# Patient Record
Sex: Female | Born: 1955
Health system: Southern US, Community
[De-identification: ages and names within clinical notes are randomized; demographics above are authoritative.]

## PROBLEM LIST (undated history)

## (undated) DIAGNOSIS — H04129 Dry eye syndrome of unspecified lacrimal gland: Secondary | ICD-10-CM

## (undated) DIAGNOSIS — C801 Malignant (primary) neoplasm, unspecified: Secondary | ICD-10-CM

## (undated) DIAGNOSIS — M810 Age-related osteoporosis without current pathological fracture: Secondary | ICD-10-CM

## (undated) DIAGNOSIS — M797 Fibromyalgia: Secondary | ICD-10-CM

## (undated) DIAGNOSIS — F419 Anxiety disorder, unspecified: Secondary | ICD-10-CM

## (undated) DIAGNOSIS — R51 Headache: Secondary | ICD-10-CM

## (undated) DIAGNOSIS — J309 Allergic rhinitis, unspecified: Secondary | ICD-10-CM

## (undated) DIAGNOSIS — E559 Vitamin D deficiency, unspecified: Secondary | ICD-10-CM

## (undated) DIAGNOSIS — R519 Headache, unspecified: Secondary | ICD-10-CM

## (undated) DIAGNOSIS — Z973 Presence of spectacles and contact lenses: Secondary | ICD-10-CM

## (undated) DIAGNOSIS — G4730 Sleep apnea, unspecified: Secondary | ICD-10-CM

## (undated) DIAGNOSIS — F32A Depression, unspecified: Secondary | ICD-10-CM

## (undated) DIAGNOSIS — M1990 Unspecified osteoarthritis, unspecified site: Secondary | ICD-10-CM

## (undated) DIAGNOSIS — G8929 Other chronic pain: Secondary | ICD-10-CM

## (undated) DIAGNOSIS — F329 Major depressive disorder, single episode, unspecified: Secondary | ICD-10-CM

## (undated) DIAGNOSIS — I639 Cerebral infarction, unspecified: Secondary | ICD-10-CM

## (undated) DIAGNOSIS — K219 Gastro-esophageal reflux disease without esophagitis: Secondary | ICD-10-CM

## (undated) HISTORY — DX: Fibromyalgia: M79.7

## (undated) HISTORY — PX: BACK SURGERY: SHX140

## (undated) HISTORY — PX: BUNIONECTOMY: SHX129

## (undated) HISTORY — DX: Age-related osteoporosis without current pathological fracture: M81.0

## (undated) HISTORY — DX: Sleep apnea, unspecified: G47.30

## (undated) HISTORY — DX: Allergic rhinitis, unspecified: J30.9

## (undated) HISTORY — DX: Dry eye syndrome of unspecified lacrimal gland: H04.129

## (undated) HISTORY — DX: Other chronic pain: G89.29

## (undated) HISTORY — PX: ABDOMINAL HYSTERECTOMY: SHX81

## (undated) HISTORY — PX: SKIN CANCER EXCISION: SHX779

## (undated) HISTORY — PX: EYE SURGERY: SHX253

## (undated) HISTORY — PX: NASAL SINUS SURGERY: SHX719

## (undated) HISTORY — PX: TUBAL LIGATION: SHX77

---

## 2012-09-13 DIAGNOSIS — J3089 Other allergic rhinitis: Secondary | ICD-10-CM | POA: Insufficient documentation

## 2012-09-13 DIAGNOSIS — G629 Polyneuropathy, unspecified: Secondary | ICD-10-CM | POA: Insufficient documentation

## 2012-09-13 DIAGNOSIS — G43909 Migraine, unspecified, not intractable, without status migrainosus: Secondary | ICD-10-CM | POA: Insufficient documentation

## 2012-09-13 DIAGNOSIS — J309 Allergic rhinitis, unspecified: Secondary | ICD-10-CM | POA: Insufficient documentation

## 2015-12-05 DIAGNOSIS — M48061 Spinal stenosis, lumbar region without neurogenic claudication: Secondary | ICD-10-CM | POA: Insufficient documentation

## 2016-03-23 DIAGNOSIS — M7751 Other enthesopathy of right foot: Secondary | ICD-10-CM | POA: Insufficient documentation

## 2016-07-22 DIAGNOSIS — E663 Overweight: Secondary | ICD-10-CM | POA: Insufficient documentation

## 2016-07-22 HISTORY — DX: Overweight: E66.3

## 2016-07-28 ENCOUNTER — Other Ambulatory Visit: Payer: Self-pay | Admitting: Neurological Surgery

## 2016-08-13 NOTE — Pre-Procedure Instructions (Addendum)
    Sharon Logan  08/13/2016      [id number] - [place], [place] [address] [place] [address] Phone: [phone] Fax: [phone]    Your procedure is scheduled on Fri. Mar. 9  Report to [facility] Admitting at 8:00 A.M.  Call this number if you have problems the morning of surgery:  [phone]   Remember:  Do not eat food or drink liquids after midnight.   Take these medicines the morning of surgery with A SIP OF WATER : eye drops if needed, replax if needed, lexapro, estrace,flonase nasal spray if needed, hydrocodone if needed, protonix             1 week prior to surgery stop: aspirin, advil, motrin, ibuprofen, aleve, BC Powders, Goody's, vitamins/herbal medicines.   Do not wear jewelry, make-up or nail polish.  Do not wear lotions, powders, or perfumes, or deoderant.  Do not shave 48 hours prior to surgery.  Men may shave face and neck.  Do not bring valuables to the hospital.  [facility] is not responsible for any belongings or valuables.  Contacts, dentures or bridgework may not be worn into surgery.  Leave your suitcase in the car.  After surgery it may be brought to your room.  For patients admitted to the hospital, discharge time will be determined by your treatment team.  Patients discharged the day of surgery will not be allowed to drive home.    Special instructions:  Review preparing for surgery  Please read over the following fact sheets that you were given. Coughing and Deep Breathing and MRSA Information

## 2016-08-14 ENCOUNTER — Encounter (HOSPITAL_COMMUNITY)
Admission: RE | Admit: 2016-08-14 | Discharge: 2016-08-14 | Disposition: A | Discharge status: Home / Self Care | Payer: Federal, State, Local not specified - PPO | Source: Ambulatory Visit | Attending: Neurological Surgery | Admitting: Neurological Surgery

## 2016-08-14 ENCOUNTER — Encounter (HOSPITAL_COMMUNITY): Payer: Self-pay | Admitting: *Deleted

## 2016-08-14 DIAGNOSIS — M4316 Spondylolisthesis, lumbar region: Secondary | ICD-10-CM | POA: Diagnosis not present

## 2016-08-14 DIAGNOSIS — Z01818 Encounter for other preprocedural examination: Secondary | ICD-10-CM | POA: Insufficient documentation

## 2016-08-14 HISTORY — DX: Major depressive disorder, single episode, unspecified: F32.9

## 2016-08-14 HISTORY — DX: Headache, unspecified: R51.9

## 2016-08-14 HISTORY — DX: Depression, unspecified: F32.A

## 2016-08-14 HISTORY — DX: Anxiety disorder, unspecified: F41.9

## 2016-08-14 HISTORY — DX: Headache: R51

## 2016-08-14 LAB — TYPE AND SCREEN
ABO/RH(D): A POS
ANTIBODY SCREEN: NEGATIVE

## 2016-08-14 LAB — CBC
HCT: 40.6 % (ref 36.0–46.0)
Hemoglobin: 13.1 g/dL (ref 12.0–15.0)
MCH: 30 pg (ref 26.0–34.0)
MCHC: 32.3 g/dL (ref 30.0–36.0)
MCV: 92.9 fL (ref 78.0–100.0)
PLATELETS: 209 10*3/uL (ref 150–400)
RBC: 4.37 MIL/uL (ref 3.87–5.11)
RDW: 13.2 % (ref 11.5–15.5)
WBC: 4.9 10*3/uL (ref 4.0–10.5)

## 2016-08-14 LAB — SURGICAL PCR SCREEN
MRSA, PCR: NEGATIVE
Staphylococcus aureus: NEGATIVE

## 2016-08-14 LAB — ABO/RH: ABO/RH(D): A POS

## 2016-08-14 NOTE — Progress Notes (Signed)
PCP: Dr. Betti Cruz @ [facility] in [place]

## 2016-08-21 ENCOUNTER — Inpatient Hospital Stay (HOSPITAL_COMMUNITY): Payer: Federal, State, Local not specified - PPO | Admitting: Certified Registered"

## 2016-08-21 ENCOUNTER — Inpatient Hospital Stay (HOSPITAL_COMMUNITY): Payer: Federal, State, Local not specified - PPO

## 2016-08-21 ENCOUNTER — Inpatient Hospital Stay (HOSPITAL_COMMUNITY)
Admission: RE | Admit: 2016-08-21 | Discharge: 2016-08-22 | DRG: 455 | Disposition: A | Discharge status: Home / Self Care | Payer: Federal, State, Local not specified - PPO | Source: Ambulatory Visit | Attending: Neurological Surgery | Admitting: Neurological Surgery

## 2016-08-21 ENCOUNTER — Encounter (HOSPITAL_COMMUNITY)
Admission: RE | Disposition: A | Discharge status: Home / Self Care | Payer: Self-pay | Source: Ambulatory Visit | Attending: Neurological Surgery

## 2016-08-21 DIAGNOSIS — Z85828 Personal history of other malignant neoplasm of skin: Secondary | ICD-10-CM | POA: Diagnosis not present

## 2016-08-21 DIAGNOSIS — F329 Major depressive disorder, single episode, unspecified: Secondary | ICD-10-CM | POA: Diagnosis present

## 2016-08-21 DIAGNOSIS — M4316 Spondylolisthesis, lumbar region: Secondary | ICD-10-CM | POA: Diagnosis present

## 2016-08-21 DIAGNOSIS — M5416 Radiculopathy, lumbar region: Secondary | ICD-10-CM | POA: Diagnosis present

## 2016-08-21 DIAGNOSIS — M545 Low back pain: Secondary | ICD-10-CM | POA: Diagnosis present

## 2016-08-21 DIAGNOSIS — Z79899 Other long term (current) drug therapy: Secondary | ICD-10-CM | POA: Diagnosis not present

## 2016-08-21 DIAGNOSIS — Z419 Encounter for procedure for purposes other than remedying health state, unspecified: Secondary | ICD-10-CM

## 2016-08-21 DIAGNOSIS — M48061 Spinal stenosis, lumbar region without neurogenic claudication: Secondary | ICD-10-CM | POA: Diagnosis present

## 2016-08-21 DIAGNOSIS — Z7982 Long term (current) use of aspirin: Secondary | ICD-10-CM

## 2016-08-21 DIAGNOSIS — F419 Anxiety disorder, unspecified: Secondary | ICD-10-CM | POA: Diagnosis present

## 2016-08-21 IMAGING — CR DG LUMBAR SPINE 2-3V
2 series · 2 of 2 positions shown · non-contrast
Comparison: None.

CLINICAL DATA: L4-5 PLIF

EXAM:
LUMBAR SPINE - 2-3 VIEW

[lateral (1 of 2)]
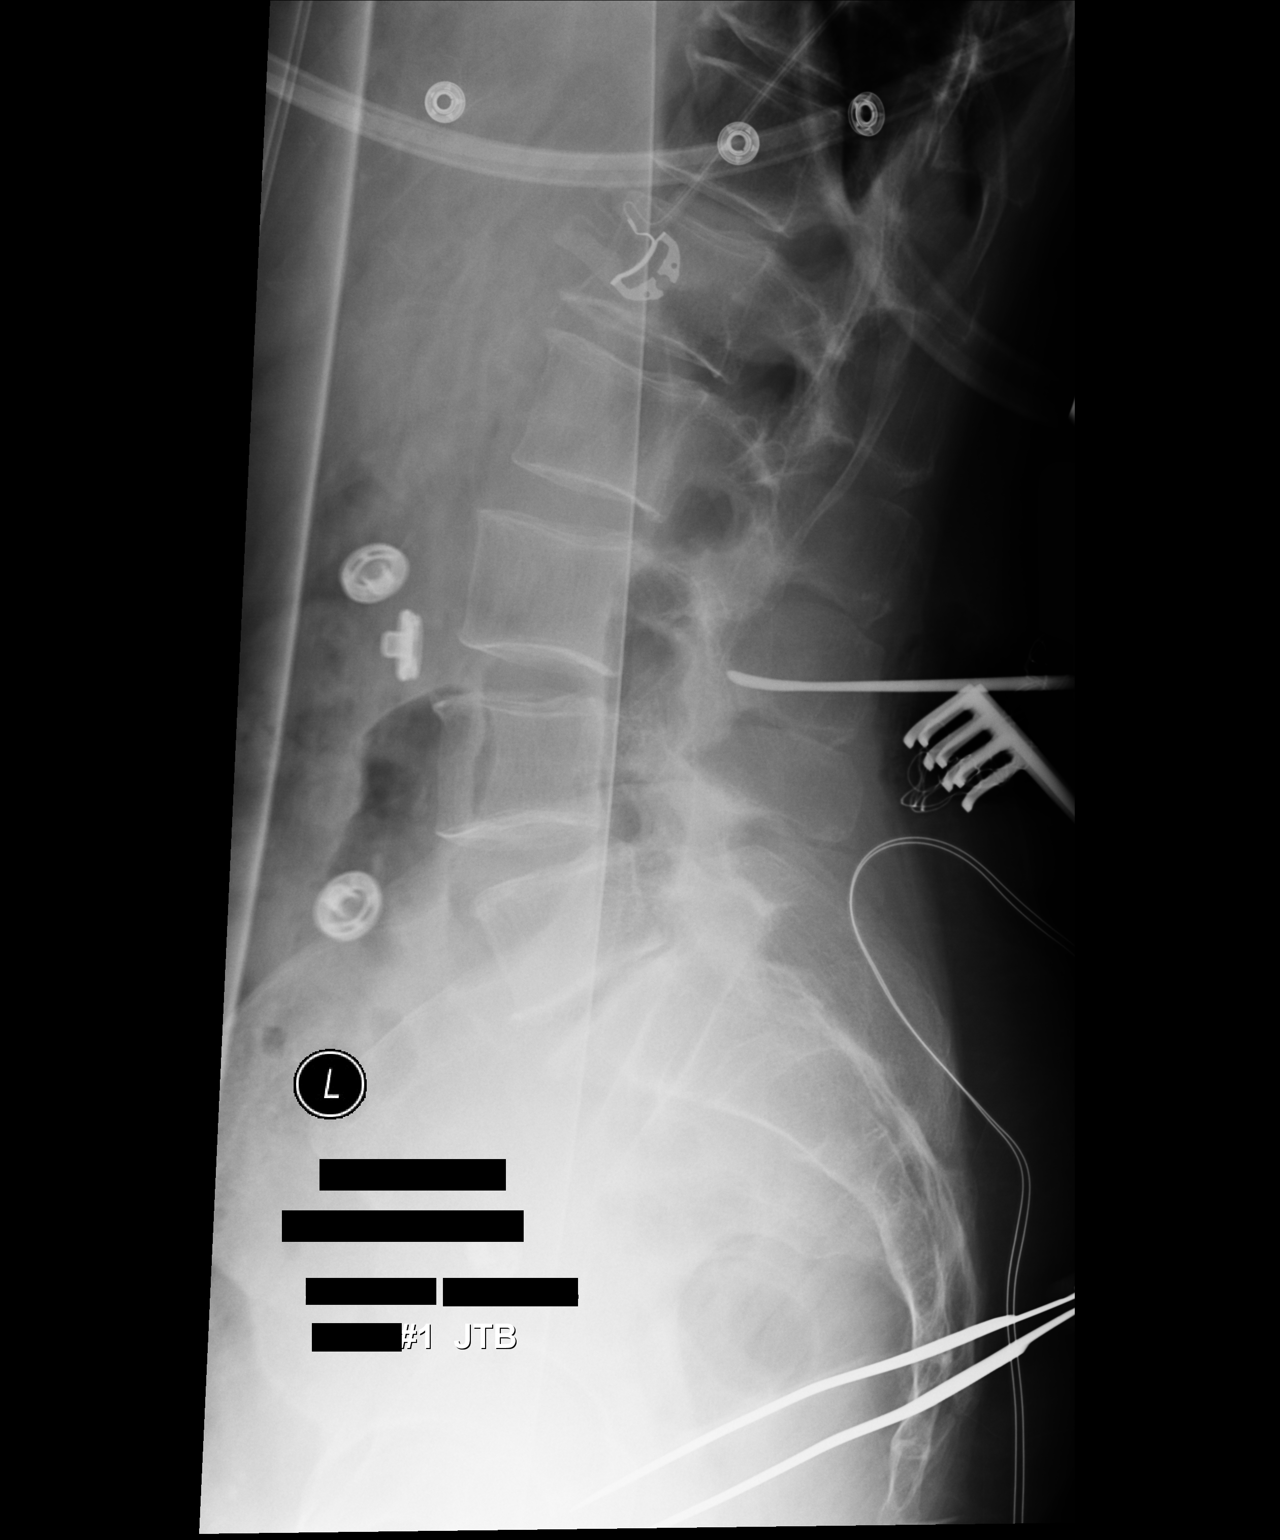

[lateral (2 of 2)]
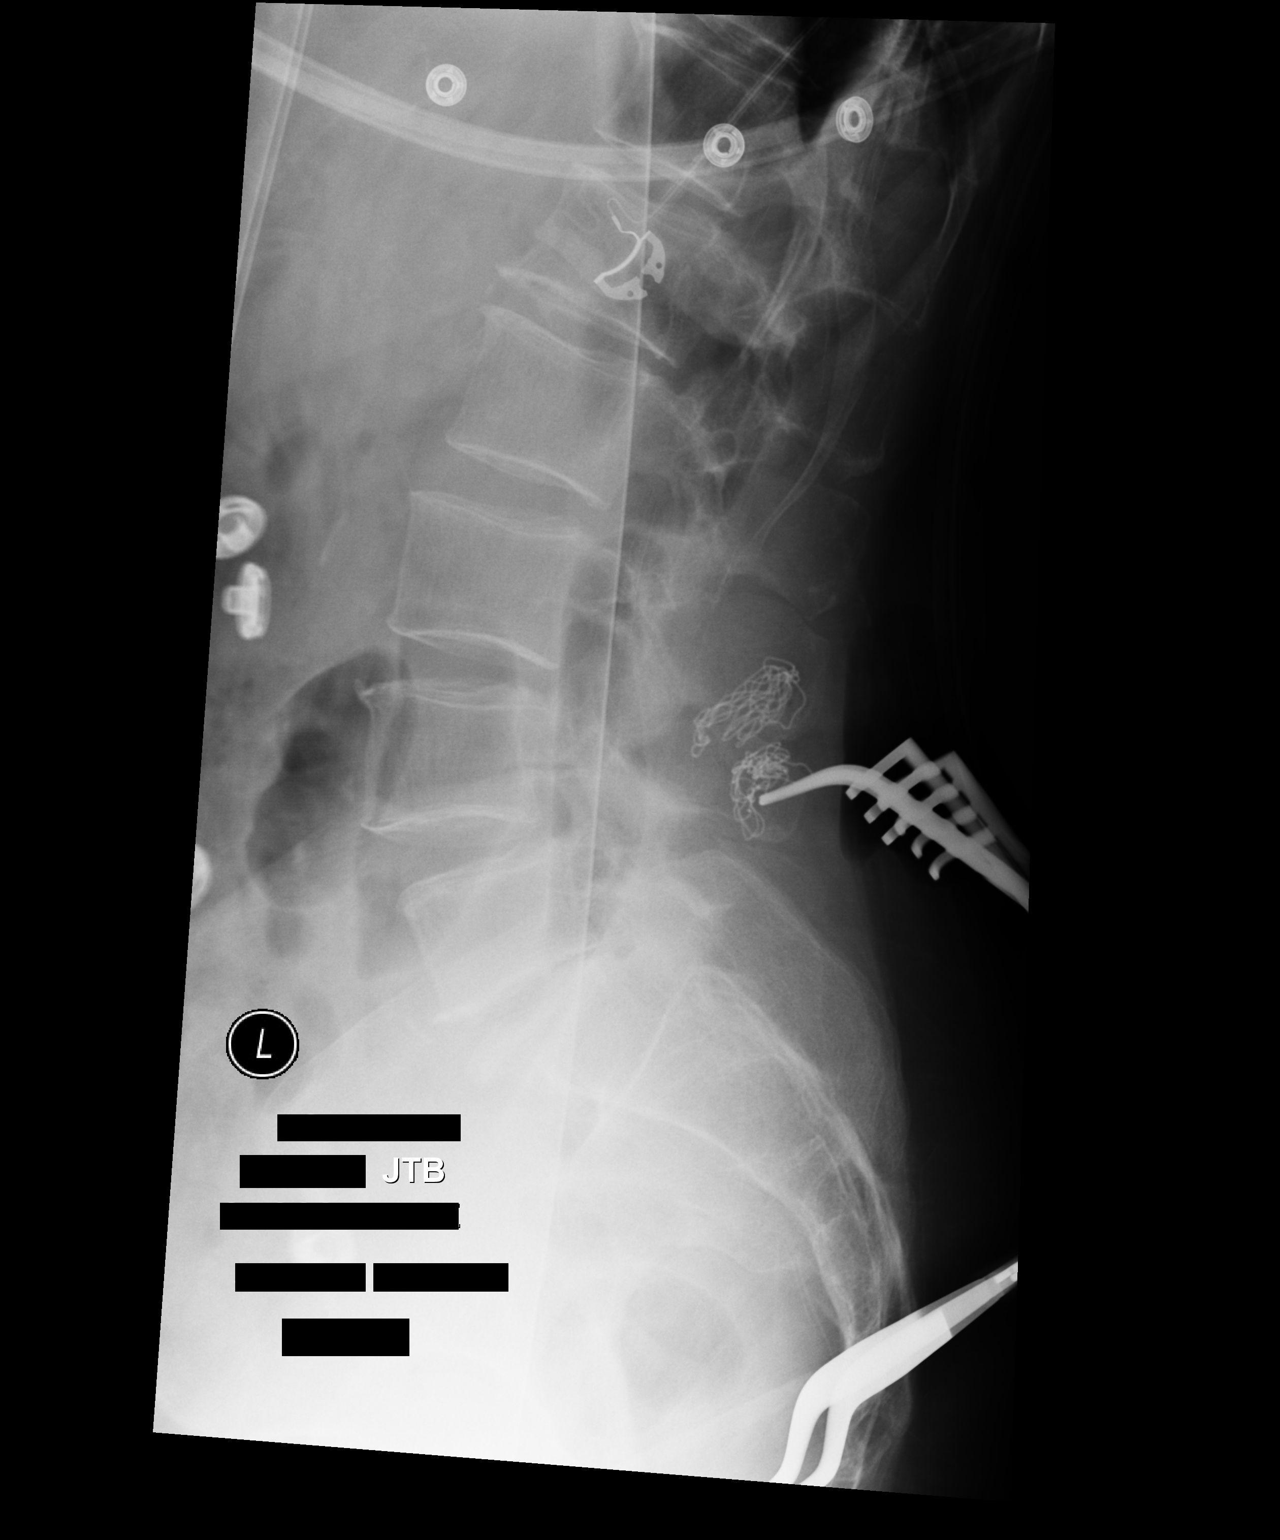

[2 of 2 positions shown; findings below may reference images not displayed]

FINDINGS: No previous imaging studies are directly available for comparison
for numbering purposes, the caudal most lumbar type vertebral body
is been labeled L5. This places the last full open disc space at
L5-S1.

Film of labeled 1 obtained at [7M] hours. Soft tissue retractors are
noted posteriorly with an apparent spines in the operative bed.
Surgical probe overlies the lower lumbar spine, superimposed on the
L3 spinous process with the tip of the probe just posterior to the
L3 inferior facet.

Film labeled 2 obtained [7M] hours. Soft tissue retractors and
surgical sponges again noted. Surgical device is overlying the lower
back with the tip superimposed on the L4 spinous process.
IMPRESSION: Intraoperative localization.

## 2016-08-21 IMAGING — RF DG C-ARM 61-120 MIN
1 series · 2 of 2 positions shown · non-contrast
Comparison: None.

CLINICAL DATA: L4-5 surgery

FLUOROSCOPY TIME:  20 seconds.
Images: 2
EXAM:
DG C-ARM 61-120 MIN

[Series 1: run · 2 of 2 slices shown]
[im 1/2]
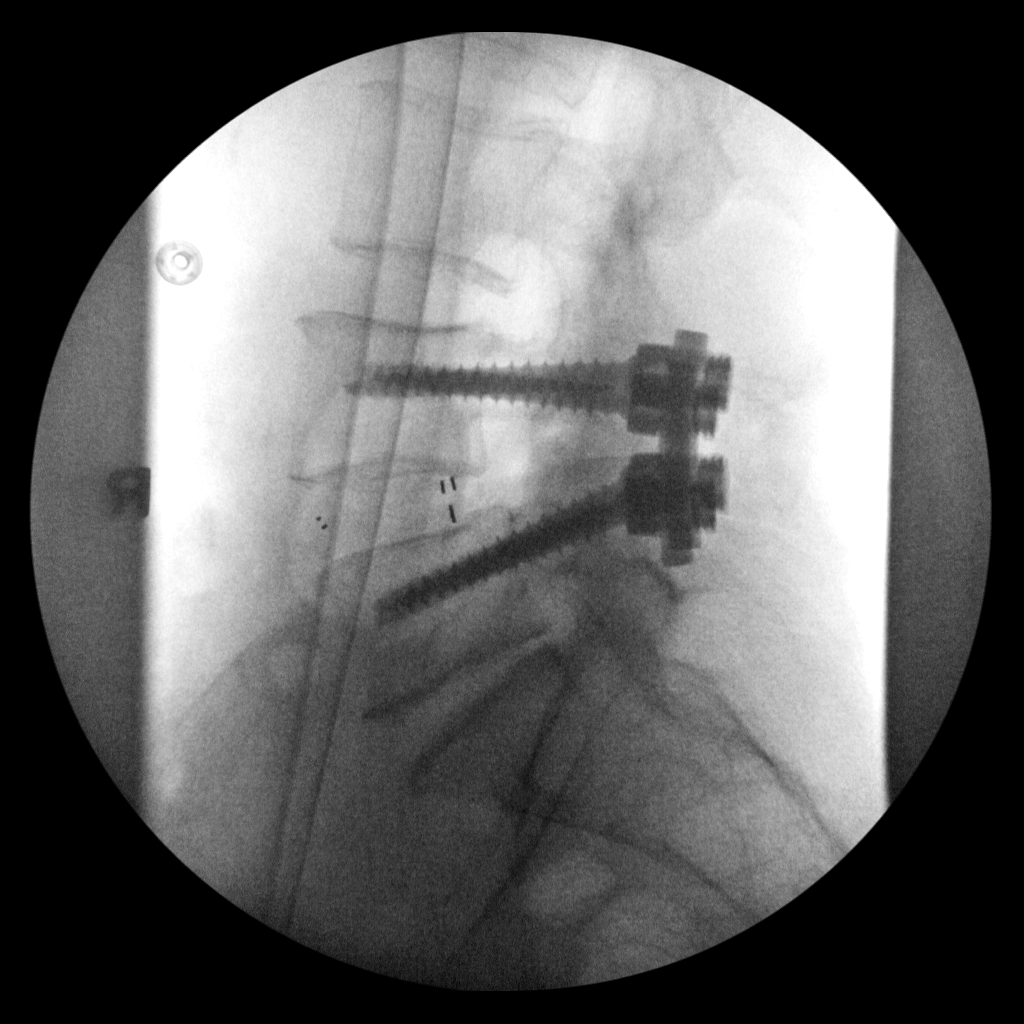
[im 2/2]
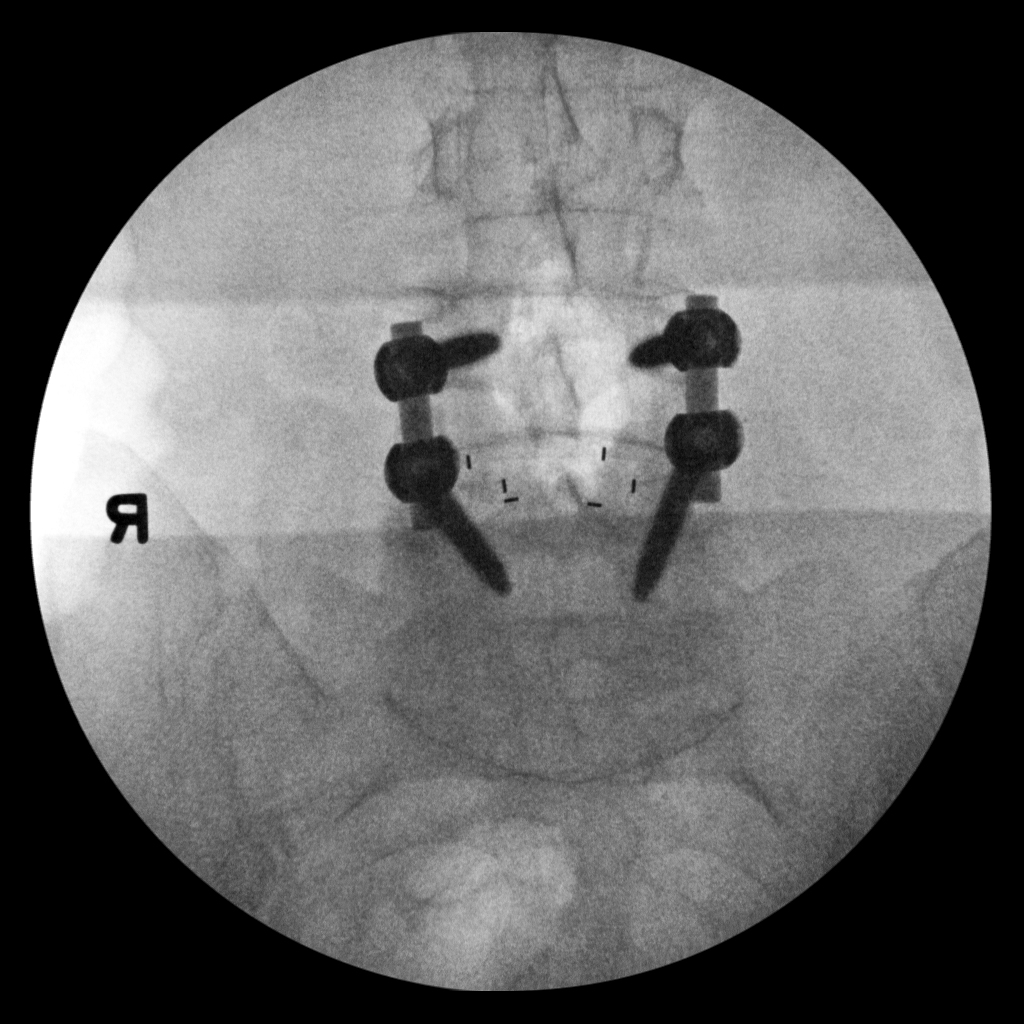

[2 of 2 positions shown; findings below may reference images not displayed]

FINDINGS: Pedicle rods and screws are seen at L4-5 with a disc spacer device
at the same level. Hardware is in good position.
IMPRESSION: L4-5 surgery as above.

## 2016-08-21 SURGERY — POSTERIOR LUMBAR FUSION 1 LEVEL
Anesthesia: General | Site: Back

## 2016-08-21 MED ORDER — DEXAMETHASONE SODIUM PHOSPHATE 10 MG/ML IJ SOLN
INTRAMUSCULAR | Status: AC
  Filled 2016-08-21: qty 1

## 2016-08-21 MED ORDER — PROPOFOL 10 MG/ML IV BOLUS
INTRAVENOUS | Status: AC
  Filled 2016-08-21: qty 20

## 2016-08-21 MED ORDER — FENTANYL CITRATE (PF) 100 MCG/2ML IJ SOLN
INTRAMUSCULAR | Status: AC
  Filled 2016-08-21: qty 2

## 2016-08-21 MED ORDER — BISACODYL 10 MG RE SUPP: 10.0000 mg | Freq: Every day | RECTAL | Status: DC | PRN

## 2016-08-21 MED ORDER — CEFAZOLIN SODIUM-DEXTROSE 2-4 GM/100ML-% IV SOLN
INTRAVENOUS | Status: AC
  Filled 2016-08-21: qty 100

## 2016-08-21 MED ORDER — CHLORHEXIDINE GLUCONATE CLOTH 2 % EX PADS: 6.0000 | MEDICATED_PAD | Freq: Once | CUTANEOUS | Status: DC

## 2016-08-21 MED ORDER — FENTANYL CITRATE (PF) 100 MCG/2ML IJ SOLN
INTRAMUSCULAR | Status: AC
  Filled 2016-08-21: qty 4

## 2016-08-21 MED ORDER — FLUTICASONE PROPIONATE 50 MCG/ACT NA SUSP: 2.0000 | Freq: Two times a day (BID) | NASAL | Status: DC | PRN

## 2016-08-21 MED ORDER — ONDANSETRON HCL 4 MG PO TABS: 4.0000 mg | ORAL_TABLET | Freq: Four times a day (QID) | ORAL | Status: DC | PRN

## 2016-08-21 MED ORDER — SACCHAROMYCES BOULARDII 250 MG PO CAPS
500.0000 mg | ORAL_CAPSULE | Freq: Every day | ORAL | Status: DC
  Administered 2016-08-22: 500 mg via ORAL
  Filled 2016-08-21 (×2): qty 2

## 2016-08-21 MED ORDER — THROMBIN 5000 UNITS EX SOLR
OROMUCOSAL | Status: DC | PRN
  Administered 2016-08-21: 5 mL

## 2016-08-21 MED ORDER — ELETRIPTAN HYDROBROMIDE 40 MG PO TABS
40.0000 mg | ORAL_TABLET | ORAL | Status: DC | PRN
  Filled 2016-08-21: qty 1

## 2016-08-21 MED ORDER — POLYETHYLENE GLYCOL 3350 17 G PO PACK: 17.0000 g | PACK | Freq: Every day | ORAL | Status: DC | PRN

## 2016-08-21 MED ORDER — SODIUM CHLORIDE 0.9 % IV SOLN: 250.0000 mL | INTRAVENOUS | Status: DC

## 2016-08-21 MED ORDER — ONDANSETRON HCL 4 MG/2ML IJ SOLN
INTRAMUSCULAR | Status: DC | PRN
  Administered 2016-08-21: 4 mg via INTRAVENOUS

## 2016-08-21 MED ORDER — DOCUSATE SODIUM 100 MG PO CAPS
100.0000 mg | ORAL_CAPSULE | Freq: Two times a day (BID) | ORAL | Status: DC
  Administered 2016-08-21 – 2016-08-22 (×2): 100 mg via ORAL
  Filled 2016-08-21 (×2): qty 1

## 2016-08-21 MED ORDER — EPHEDRINE SULFATE 50 MG/ML IJ SOLN
INTRAMUSCULAR | Status: DC | PRN
  Administered 2016-08-21 (×3): 10 mg via INTRAVENOUS

## 2016-08-21 MED ORDER — THROMBIN 20000 UNITS EX SOLR
CUTANEOUS | Status: AC
  Filled 2016-08-21: qty 20000

## 2016-08-21 MED ORDER — ACETAMINOPHEN 325 MG PO TABS: 650.0000 mg | ORAL_TABLET | ORAL | Status: DC | PRN

## 2016-08-21 MED ORDER — SODIUM CHLORIDE 0.9% FLUSH
3.0000 mL | Freq: Two times a day (BID) | INTRAVENOUS | Status: DC
  Administered 2016-08-21: 3 mL via INTRAVENOUS

## 2016-08-21 MED ORDER — ALUM & MAG HYDROXIDE-SIMETH 200-200-20 MG/5ML PO SUSP: 30.0000 mL | Freq: Four times a day (QID) | ORAL | Status: DC | PRN

## 2016-08-21 MED ORDER — LIDOCAINE-EPINEPHRINE 1 %-1:100000 IJ SOLN
INTRAMUSCULAR | Status: DC | PRN
  Administered 2016-08-21: 5 mL

## 2016-08-21 MED ORDER — ONDANSETRON HCL 4 MG/2ML IJ SOLN
INTRAMUSCULAR | Status: AC
  Filled 2016-08-21: qty 2

## 2016-08-21 MED ORDER — THROMBIN 20000 UNITS EX SOLR
CUTANEOUS | Status: DC | PRN
  Administered 2016-08-21: 20 mL via TOPICAL

## 2016-08-21 MED ORDER — ESTRADIOL 1 MG PO TABS
1.0000 mg | ORAL_TABLET | Freq: Every day | ORAL | Status: DC
  Administered 2016-08-22: 1 mg via ORAL
  Filled 2016-08-21 (×2): qty 1

## 2016-08-21 MED ORDER — HYDROCODONE-ACETAMINOPHEN 10-325 MG PO TABS
1.0000 | ORAL_TABLET | Freq: Two times a day (BID) | ORAL | Status: DC
  Administered 2016-08-21 – 2016-08-22 (×2): 1 via ORAL
  Filled 2016-08-21 (×2): qty 1

## 2016-08-21 MED ORDER — SUGAMMADEX SODIUM 200 MG/2ML IV SOLN
INTRAVENOUS | Status: AC
  Filled 2016-08-21: qty 2

## 2016-08-21 MED ORDER — CEFAZOLIN SODIUM-DEXTROSE 2-4 GM/100ML-% IV SOLN
2.0000 g | Freq: Three times a day (TID) | INTRAVENOUS | Status: AC
  Administered 2016-08-21: 2 g via INTRAVENOUS
  Filled 2016-08-21 (×3): qty 100

## 2016-08-21 MED ORDER — PANTOPRAZOLE SODIUM 40 MG PO TBEC
40.0000 mg | DELAYED_RELEASE_TABLET | Freq: Every day | ORAL | Status: DC
  Administered 2016-08-21 – 2016-08-22 (×2): 40 mg via ORAL
  Filled 2016-08-21 (×2): qty 1

## 2016-08-21 MED ORDER — SENNA 8.6 MG PO TABS
1.0000 | ORAL_TABLET | Freq: Two times a day (BID) | ORAL | Status: DC
  Administered 2016-08-21 – 2016-08-22 (×2): 8.6 mg via ORAL
  Filled 2016-08-21 (×2): qty 1

## 2016-08-21 MED ORDER — EPHEDRINE 5 MG/ML INJ
INTRAVENOUS | Status: AC
  Filled 2016-08-21: qty 10

## 2016-08-21 MED ORDER — BUPIVACAINE HCL (PF) 0.5 % IJ SOLN
INTRAMUSCULAR | Status: DC | PRN
  Administered 2016-08-21: 20 mL
  Administered 2016-08-21: 5 mL

## 2016-08-21 MED ORDER — HYDROCODONE-ACETAMINOPHEN 5-325 MG PO TABS
1.0000 | ORAL_TABLET | ORAL | Status: DC | PRN
  Administered 2016-08-21 – 2016-08-22 (×3): 2 via ORAL
  Filled 2016-08-21 (×3): qty 2

## 2016-08-21 MED ORDER — HYDROCODONE-ACETAMINOPHEN 10-325 MG PO TABS: 1.0000 | ORAL_TABLET | Freq: Four times a day (QID) | ORAL | 0 refills | Status: DC | PRN

## 2016-08-21 MED ORDER — LIDOCAINE-EPINEPHRINE 2 %-1:100000 IJ SOLN
INTRAMUSCULAR | Status: AC
  Filled 2016-08-21: qty 1

## 2016-08-21 MED ORDER — PROPOFOL 10 MG/ML IV BOLUS
INTRAVENOUS | Status: DC | PRN
  Administered 2016-08-21: 140 mg via INTRAVENOUS

## 2016-08-21 MED ORDER — CEFAZOLIN SODIUM 1 G IJ SOLR
INTRAMUSCULAR | Status: DC | PRN
  Administered 2016-08-21: 2 g via INTRAMUSCULAR

## 2016-08-21 MED ORDER — THROMBIN 5000 UNITS EX SOLR
CUTANEOUS | Status: AC
  Filled 2016-08-21: qty 5000

## 2016-08-21 MED ORDER — LIDOCAINE HCL (CARDIAC) 20 MG/ML IV SOLN
INTRAVENOUS | Status: DC | PRN
  Administered 2016-08-21: 80 mg via INTRAVENOUS

## 2016-08-21 MED ORDER — HYDROMORPHONE HCL 1 MG/ML IJ SOLN: 0.2500 mg | INTRAMUSCULAR | Status: DC | PRN

## 2016-08-21 MED ORDER — FLEET ENEMA 7-19 GM/118ML RE ENEM: 1.0000 | ENEMA | Freq: Once | RECTAL | Status: DC | PRN

## 2016-08-21 MED ORDER — FENTANYL CITRATE (PF) 100 MCG/2ML IJ SOLN
INTRAMUSCULAR | Status: DC | PRN
  Administered 2016-08-21: 100 ug via INTRAVENOUS
  Administered 2016-08-21 (×5): 50 ug via INTRAVENOUS

## 2016-08-21 MED ORDER — SODIUM CHLORIDE 0.9 % IV SOLN: INTRAVENOUS | Status: DC

## 2016-08-21 MED ORDER — MENTHOL 3 MG MT LOZG: 1.0000 | LOZENGE | OROMUCOSAL | Status: DC | PRN

## 2016-08-21 MED ORDER — LACTATED RINGERS IV SOLN
INTRAVENOUS | Status: DC
  Administered 2016-08-21: 13:00:00 via INTRAVENOUS
  Administered 2016-08-21: 50 mL/h via INTRAVENOUS
  Administered 2016-08-21: 15:00:00 via INTRAVENOUS

## 2016-08-21 MED ORDER — ONDANSETRON HCL 4 MG/2ML IJ SOLN: 4.0000 mg | Freq: Four times a day (QID) | INTRAMUSCULAR | Status: DC | PRN

## 2016-08-21 MED ORDER — ESCITALOPRAM OXALATE 10 MG PO TABS
10.0000 mg | ORAL_TABLET | Freq: Every day | ORAL | Status: DC
  Administered 2016-08-22: 10 mg via ORAL
  Filled 2016-08-21 (×2): qty 1

## 2016-08-21 MED ORDER — DEXAMETHASONE 1 MG PO TABS: ORAL_TABLET | ORAL | 0 refills | Status: DC

## 2016-08-21 MED ORDER — HYDROMORPHONE HCL 1 MG/ML IJ SOLN
1.0000 mg | INTRAMUSCULAR | Status: DC | PRN
  Administered 2016-08-21 – 2016-08-22 (×2): 1 mg via INTRAVENOUS
  Filled 2016-08-21 (×2): qty 1

## 2016-08-21 MED ORDER — BUPIVACAINE HCL (PF) 0.5 % IJ SOLN
INTRAMUSCULAR | Status: AC
  Filled 2016-08-21: qty 30

## 2016-08-21 MED ORDER — 0.9 % SODIUM CHLORIDE (POUR BTL) OPTIME
TOPICAL | Status: DC | PRN
  Administered 2016-08-21: 1000 mL

## 2016-08-21 MED ORDER — POLYVINYL ALCOHOL 1.4 % OP SOLN: 1.0000 [drp] | Freq: Every day | OPHTHALMIC | Status: DC | PRN

## 2016-08-21 MED ORDER — MIDAZOLAM HCL 2 MG/2ML IJ SOLN
INTRAMUSCULAR | Status: AC
  Filled 2016-08-21: qty 2

## 2016-08-21 MED ORDER — METHOCARBAMOL 500 MG PO TABS
500.0000 mg | ORAL_TABLET | Freq: Four times a day (QID) | ORAL | Status: DC | PRN
  Administered 2016-08-21: 500 mg via ORAL
  Filled 2016-08-21: qty 1

## 2016-08-21 MED ORDER — DEXAMETHASONE SODIUM PHOSPHATE 10 MG/ML IJ SOLN
INTRAMUSCULAR | Status: DC | PRN
  Administered 2016-08-21: 10 mg via INTRAVENOUS

## 2016-08-21 MED ORDER — ACETAMINOPHEN 650 MG RE SUPP: 650.0000 mg | RECTAL | Status: DC | PRN

## 2016-08-21 MED ORDER — PREGABALIN 50 MG PO CAPS
50.0000 mg | ORAL_CAPSULE | Freq: Every day | ORAL | Status: DC
  Administered 2016-08-21: 50 mg via ORAL
  Filled 2016-08-21: qty 1

## 2016-08-21 MED ORDER — METHOCARBAMOL 1000 MG/10ML IJ SOLN
500.0000 mg | Freq: Four times a day (QID) | INTRAVENOUS | Status: DC | PRN
  Filled 2016-08-21: qty 5

## 2016-08-21 MED ORDER — ROCURONIUM BROMIDE 100 MG/10ML IV SOLN
INTRAVENOUS | Status: DC | PRN
  Administered 2016-08-21: 50 mg via INTRAVENOUS

## 2016-08-21 MED ORDER — SODIUM CHLORIDE 0.9% FLUSH: 3.0000 mL | INTRAVENOUS | Status: DC | PRN

## 2016-08-21 MED ORDER — DIAZEPAM 5 MG PO TABS: 5.0000 mg | ORAL_TABLET | Freq: Four times a day (QID) | ORAL | 0 refills | Status: DC | PRN

## 2016-08-21 MED ORDER — SODIUM CHLORIDE 0.9 % IR SOLN
Status: DC | PRN
  Administered 2016-08-21: 500 mL

## 2016-08-21 MED ORDER — CEFAZOLIN SODIUM-DEXTROSE 2-4 GM/100ML-% IV SOLN: 2.0000 g | INTRAVENOUS | Status: DC

## 2016-08-21 MED ORDER — DEXAMETHASONE SODIUM PHOSPHATE 4 MG/ML IJ SOLN: 2.0000 mg | Freq: Two times a day (BID) | INTRAMUSCULAR | Status: DC

## 2016-08-21 MED ORDER — SUGAMMADEX SODIUM 200 MG/2ML IV SOLN
INTRAVENOUS | Status: DC | PRN
  Administered 2016-08-21: 140 mg via INTRAVENOUS

## 2016-08-21 MED ORDER — DEXAMETHASONE 2 MG PO TABS
2.0000 mg | ORAL_TABLET | Freq: Two times a day (BID) | ORAL | Status: DC
  Administered 2016-08-21 – 2016-08-22 (×2): 2 mg via ORAL
  Filled 2016-08-21 (×2): qty 1

## 2016-08-21 MED ORDER — PHENOL 1.4 % MT LIQD: 1.0000 | OROMUCOSAL | Status: DC | PRN

## 2016-08-21 MED ORDER — MIDAZOLAM HCL 5 MG/5ML IJ SOLN
INTRAMUSCULAR | Status: DC | PRN
  Administered 2016-08-21: 2 mg via INTRAVENOUS

## 2016-08-21 SURGICAL SUPPLY — 69 items
BAG DECANTER FOR FLEXI CONT (MISCELLANEOUS) ×2 IMPLANT
BASKET BONE COLLECTION (BASKET) ×2 IMPLANT
BLADE CLIPPER SURG (BLADE) IMPLANT
BONE EQUIVA 10CC (Bone Implant) ×2 IMPLANT
BUR MATCHSTICK NEURO 3.0 LAGG (BURR) ×2 IMPLANT
CANISTER SUCT 3000ML PPV (MISCELLANEOUS) ×2 IMPLANT
CARTRIDGE OIL MAESTRO DRILL (MISCELLANEOUS) ×1 IMPLANT
CONT SPEC 4OZ CLIKSEAL STRL BL (MISCELLANEOUS) ×2 IMPLANT
COVER BACK TABLE 60X90IN (DRAPES) ×2 IMPLANT
DECANTER SPIKE VIAL GLASS SM (MISCELLANEOUS) ×2 IMPLANT
DERMABOND ADVANCED (GAUZE/BANDAGES/DRESSINGS) ×1
DERMABOND ADVANCED .7 DNX12 (GAUZE/BANDAGES/DRESSINGS) ×1 IMPLANT
DEVICE DISSECT PLASMABLAD 3.0S (MISCELLANEOUS) ×1 IMPLANT
DIFFUSER DRILL AIR PNEUMATIC (MISCELLANEOUS) ×2 IMPLANT
DRAPE C-ARM 42X72 X-RAY (DRAPES) ×4 IMPLANT
DRAPE HALF SHEET 40X57 (DRAPES) IMPLANT
DRAPE LAPAROTOMY 100X72X124 (DRAPES) ×2 IMPLANT
DRAPE POUCH INSTRU U-SHP 10X18 (DRAPES) ×2 IMPLANT
DRSG OPSITE POSTOP 4X6 (GAUZE/BANDAGES/DRESSINGS) ×2 IMPLANT
DURAPREP 26ML APPLICATOR (WOUND CARE) ×2 IMPLANT
DURASEAL APPLICATOR TIP (TIP) IMPLANT
DURASEAL SPINE SEALANT 3ML (MISCELLANEOUS) IMPLANT
ELECT REM PT RETURN 9FT ADLT (ELECTROSURGICAL) ×2
ELECTRODE REM PT RTRN 9FT ADLT (ELECTROSURGICAL) ×1 IMPLANT
GAUZE SPONGE 4X4 12PLY STRL (GAUZE/BANDAGES/DRESSINGS) ×2 IMPLANT
GAUZE SPONGE 4X4 16PLY XRAY LF (GAUZE/BANDAGES/DRESSINGS) IMPLANT
GLOVE BIO SURGEON STRL SZ8 (GLOVE) ×2 IMPLANT
GLOVE BIOGEL PI IND STRL 6.5 (GLOVE) ×3 IMPLANT
GLOVE BIOGEL PI IND STRL 7.5 (GLOVE) ×1 IMPLANT
GLOVE BIOGEL PI IND STRL 8 (GLOVE) ×1 IMPLANT
GLOVE BIOGEL PI IND STRL 8.5 (GLOVE) ×2 IMPLANT
GLOVE BIOGEL PI INDICATOR 6.5 (GLOVE) ×3
GLOVE BIOGEL PI INDICATOR 7.5 (GLOVE) ×1
GLOVE BIOGEL PI INDICATOR 8 (GLOVE) ×1
GLOVE BIOGEL PI INDICATOR 8.5 (GLOVE) ×2
GLOVE ECLIPSE 7.5 STRL STRAW (GLOVE) ×4 IMPLANT
GLOVE ECLIPSE 8.5 STRL (GLOVE) ×4 IMPLANT
GLOVE SURG SS PI 6.5 STRL IVOR (GLOVE) ×6 IMPLANT
GOWN STRL REUS W/ TWL LRG LVL3 (GOWN DISPOSABLE) IMPLANT
GOWN STRL REUS W/ TWL XL LVL3 (GOWN DISPOSABLE) ×1 IMPLANT
GOWN STRL REUS W/TWL 2XL LVL3 (GOWN DISPOSABLE) ×6 IMPLANT
GOWN STRL REUS W/TWL LRG LVL3 (GOWN DISPOSABLE)
GOWN STRL REUS W/TWL XL LVL3 (GOWN DISPOSABLE) ×1
HEMOSTAT POWDER KIT SURGIFOAM (HEMOSTASIS) ×2 IMPLANT
KIT BASIN OR (CUSTOM PROCEDURE TRAY) ×2 IMPLANT
KIT ROOM TURNOVER OR (KITS) ×2 IMPLANT
NEEDLE HYPO 22GX1.5 SAFETY (NEEDLE) ×2 IMPLANT
NS IRRIG 1000ML POUR BTL (IV SOLUTION) ×2 IMPLANT
OIL CARTRIDGE MAESTRO DRILL (MISCELLANEOUS) ×2
PACK LAMINECTOMY NEURO (CUSTOM PROCEDURE TRAY) ×2 IMPLANT
PAD ARMBOARD 7.5X6 YLW CONV (MISCELLANEOUS) ×6 IMPLANT
PATTIES SURGICAL .5 X1 (DISPOSABLE) ×2 IMPLANT
PLASMABLADE 3.0S (MISCELLANEOUS) ×2
ROD TI ALLOY CVD VIT 5.5X35MM (Rod) ×4 IMPLANT
SCREW VITALITY PA 6.5X45MM (Screw) ×8 IMPLANT
SPACER ZYSTON STR 10X25X10X8 (Spacer) ×4 IMPLANT
SPONGE LAP 4X18 X RAY DECT (DISPOSABLE) IMPLANT
SPONGE SURGIFOAM ABS GEL 100 (HEMOSTASIS) ×2 IMPLANT
SUT PROLENE 6 0 BV (SUTURE) IMPLANT
SUT VIC AB 1 CT1 18XBRD ANBCTR (SUTURE) ×1 IMPLANT
SUT VIC AB 1 CT1 8-18 (SUTURE) ×1
SUT VIC AB 2-0 CP2 18 (SUTURE) ×2 IMPLANT
SUT VIC AB 3-0 SH 8-18 (SUTURE) ×2 IMPLANT
SYR 3ML LL SCALE MARK (SYRINGE) ×8 IMPLANT
TOP CLOSURE 5.5-6.0 SHEAR TOP (Neuro Prosthesis/Implant) ×8 IMPLANT
TOWEL GREEN STERILE (TOWEL DISPOSABLE) ×2 IMPLANT
TOWEL GREEN STERILE FF (TOWEL DISPOSABLE) ×2 IMPLANT
TRAY FOLEY W/METER SILVER 16FR (SET/KITS/TRAYS/PACK) ×2 IMPLANT
WATER STERILE IRR 1000ML POUR (IV SOLUTION) ×2 IMPLANT

## 2016-08-21 NOTE — Discharge Summary (Signed)
Physician Discharge Summary  Patient ID: Sharon Logan MRN: [id number] DOB/AGE: 1956-01-02 61 y.o.  Admit date: 08/21/2016 Discharge date: 08/22/2016  Admission Diagnoses:Spondylolisthesis L4-L5 with lumbar radiculopathy and lumbar stenosis.  Discharge Diagnoses: Spondylolisthesis L4-L5 with lumbar radiculopathy and lumbar stenosis.  Active Problems:   Spondylolisthesis of lumbar region   Discharged Condition: good  Hospital Course: Patient was admitted to undergo surgery and she tolerated it well  Consults: None  Significant Diagnostic Studies: None  Treatments: surgery: Decompression L4-L5 with decompression of L4 and L5 nerve roots posterior lumbar interbody arthrodesis using peek spacers local autograft and allograft. Pedicle screw fixation with posterior lateral autograft L4-L5  Discharge Exam: Blood pressure 113/60, pulse 68, temperature 98.8 F (37.1 C), temperature source Oral, resp. rate 18, SpO2 96 %. Incision is clean and dry. Motor function is intact. Station and gait are intact  Disposition: Discharge home  Discharge Instructions    Call MD for:  redness, tenderness, or signs of infection (pain, swelling, redness, odor or green/yellow discharge around incision site)    Complete by:  As directed    Call MD for:  severe uncontrolled pain    Complete by:  As directed    Call MD for:  temperature >100.4    Complete by:  As directed    Diet - low sodium heart healthy    Complete by:  As directed    Discharge instructions    Complete by:  As directed    Okay to shower. Do not apply salves or appointments to incision. No heavy lifting with the upper extremities greater than 15 pounds. May resume driving when not requiring pain medication and patient feels comfortable with doing so.   Incentive spirometry RT    Complete by:  As directed    Increase activity slowly    Complete by:  As directed      Allergies as of 08/21/2016      Reactions   Polymyxin B Other (See  Comments)   Eyes go blood red   Cymbalta [duloxetine Hcl] Other (See Comments)   Headaches, constipation   Duloxetine Other (See Comments)   HEADACHES CONSTIPATION   Lamotrigine Rash   Other Other (See Comments)   OTOBIOTIC > RED EYES   Pregabalin Rash   Itchy red rash on chest   Prozac [fluoxetine] Other (See Comments)   CONSTIPATION   Zolpidem Rash   "sleep walk"      Medication List    TAKE these medications   aspirin EC 325 MG tablet Take 325 mg by mouth daily.   calcium carbonate 500 MG chewable tablet Commonly known as:  TUMS - dosed in mg elemental calcium Chew 2 tablets by mouth daily as needed for indigestion or heartburn.   dexamethasone 1 MG tablet Commonly known as:  DECADRON 2 tablets twice daily for 2 days, one tablet twice daily for 2 days, one tablet daily for 2 days.   diazepam 5 MG tablet Commonly known as:  VALIUM Take 1 tablet (5 mg total) by mouth every 6 (six) hours as needed for muscle spasms.   eletriptan 40 MG tablet Commonly known as:  RELPAX Take 40 mg by mouth as needed for migraine or headache. May repeat in 2 hours if headache persists or recurs.   escitalopram 10 MG tablet Commonly known as:  LEXAPRO Take 10 mg by mouth daily.   estradiol 1 MG tablet Commonly known as:  ESTRACE Take 1 mg by mouth daily.   FLORASTOR 250 MG  capsule Generic drug:  saccharomyces boulardii Take 500 mg by mouth daily.   fluticasone 50 MCG/ACT nasal spray Commonly known as:  FLONASE Place 2 sprays into both nostrils 2 (two) times daily as needed for allergies or rhinitis.   HYDROcodone-acetaminophen 10-325 MG tablet Commonly known as:  NORCO Take 1 tablet by mouth 2 (two) times daily. What changed:  Another medication with the same name was added. Make sure you understand how and when to take each.   HYDROcodone-acetaminophen 10-325 MG tablet Commonly known as:  NORCO Take 1 tablet by mouth every 6 (six) hours as needed. What changed:  You were  already taking a medication with the same name, and this prescription was added. Make sure you understand how and when to take each.   OVER THE COUNTER MEDICATION Take 100 mg by mouth daily. Pregnenolone otc supplement   OVER THE COUNTER MEDICATION Take 2 capsules by mouth 2 (two) times daily as needed (allergies). Sinus Support EF   OVER THE COUNTER MEDICATION Take 1 capsule by mouth daily. ieyeastin   pantoprazole 40 MG tablet Commonly known as:  PROTONIX Take 40 mg by mouth daily.   pregabalin 50 MG capsule Commonly known as:  LYRICA Take 50 mg by mouth at bedtime.   THERATEARS OP Apply 1 drop to eye daily as needed (dry eyes).        SignedEarleen Newport 08/21/2016, 5:44 PM

## 2016-08-21 NOTE — Anesthesia Postprocedure Evaluation (Signed)
Anesthesia Post Note  Patient: Sharon Logan  Procedure(s) Performed: Procedure(s) (LRB): Lumbar four-five  Posterior lumbar interbody fusion (N/A)  Patient location during evaluation: PACU Anesthesia Type: General Level of consciousness: awake Pain management: pain level controlled Vital Signs Assessment: post-procedure vital signs reviewed and stable Respiratory status: spontaneous breathing Cardiovascular status: stable Anesthetic complications: no       Last Vitals:  Vitals:   08/21/16 1600 08/21/16 1615  BP: (!) 89/55 113/80  Pulse: 80 71  Resp: 17 15  Temp:  36.4 C    Last Pain:  Vitals:   08/21/16 1545  TempSrc:   PainSc: Asleep                 Starleen Trussell

## 2016-08-21 NOTE — H&P (Signed)
Sharon Logan is an 61 y.o. female.   Chief Complaint:Bi Lateral leg pain spondylolisthesis L4-L5. HPI: The patient is a 61 year old individual who's had significant back and bilateral lower extremity pain. She has evidence of spondylosis at multiple levels including L3-4 L4-5 and L5-S1. She has significant stenosis at L4-L5. She's been advised regarding the need for surgical decompression and stabilization at L4-5.  Past Medical History:  Diagnosis Date  . Anxiety   . Depression   . Headache     Past Surgical History:  Procedure Laterality Date  . ABDOMINAL HYSTERECTOMY    . BUNIONECTOMY Bilateral   . NASAL SINUS SURGERY     x2  . SKIN CANCER EXCISION     face x3    No family history on file. Social History:  reports that she has never smoked. She has never used smokeless tobacco. She reports that she does not drink alcohol or use drugs.  Allergies:  Allergies  Allergen Reactions  . Polymyxin B Other (See Comments)    Eyes go blood red  . Cymbalta [Duloxetine Hcl] Other (See Comments)    Headaches, constipation  . Duloxetine Other (See Comments)    HEADACHES CONSTIPATION  . Lamotrigine Rash  . Other Other (See Comments)    OTOBIOTIC > RED EYES  . Pregabalin Rash    Itchy red rash on chest  . Prozac [Fluoxetine] Other (See Comments)    CONSTIPATION  . Zolpidem Rash    "sleep walk"    Medications Prior to Admission  Medication Sig Dispense Refill  . aspirin EC 325 MG tablet Take 325 mg by mouth daily.    . calcium carbonate (TUMS - DOSED IN MG ELEMENTAL CALCIUM) 500 MG chewable tablet Chew 2 tablets by mouth daily as needed for indigestion or heartburn.    . Carboxymethylcellulose Sodium (THERATEARS OP) Apply 1 drop to eye daily as needed (dry eyes).    Marland Kitchen eletriptan (RELPAX) 40 MG tablet Take 40 mg by mouth as needed for migraine or headache. May repeat in 2 hours if headache persists or recurs.    Marland Kitchen escitalopram (LEXAPRO) 10 MG tablet Take 10 mg by mouth daily.     Marland Kitchen estradiol (ESTRACE) 1 MG tablet Take 1 mg by mouth daily.    . fluticasone (FLONASE) 50 MCG/ACT nasal spray Place 2 sprays into both nostrils 2 (two) times daily as needed for allergies or rhinitis.    Marland Kitchen HYDROcodone-acetaminophen (NORCO) 10-325 MG tablet Take 1 tablet by mouth 2 (two) times daily.     Marland Kitchen OVER THE COUNTER MEDICATION Take 100 mg by mouth daily. Pregnenolone otc supplement    . OVER THE COUNTER MEDICATION Take 2 capsules by mouth 2 (two) times daily as needed (allergies). Sinus Support EF    . OVER THE COUNTER MEDICATION Take 1 capsule by mouth daily. ieyeastin    . pantoprazole (PROTONIX) 40 MG tablet Take 40 mg by mouth daily.    . pregabalin (LYRICA) 50 MG capsule Take 50 mg by mouth at bedtime.    . saccharomyces boulardii (FLORASTOR) 250 MG capsule Take 500 mg by mouth daily.      No results found for this or any previous visit (from the past 48 hour(s)). No results found.  Review of Systems  HENT: Negative.   Eyes: Negative.   Respiratory: Negative.   Cardiovascular: Negative.   Gastrointestinal: Negative.   Genitourinary: Negative.   Musculoskeletal: Positive for back pain.  Skin: Negative.   Neurological: Positive for tingling, tremors,  focal weakness and weakness.  Psychiatric/Behavioral: Negative.     Blood pressure 138/66, pulse (!) 54, temperature 97.6 F (36.4 C), temperature source Oral, resp. rate 14, SpO2 98 %. Physical Exam  Constitutional: She is oriented to person, place, and time. She appears well-developed and well-nourished.  HENT:  Head: Normocephalic and atraumatic.  Eyes: Conjunctivae and EOM are normal. Pupils are equal, round, and reactive to light.  GI: Bowel sounds are normal.  Musculoskeletal: Normal range of motion.  Neurological: She is alert and oriented to person, place, and time.  Back and bilateral leg weakness iliopsoas strength is 4 out of 5 tibialis anterior strength is 4 minus out of 5 gastroc strength is 5 out of 5. Tone  and bulk are intact. Deep tendon reflexes are absent in the patellae and the Achilles both. Nerve examination is normal.  Skin: Skin is warm and dry.  Psychiatric: She has a normal mood and affect. Her behavior is normal. Judgment and thought content normal.     Assessment/Plan Spondylolisthesis L4-L5 with severe stenosis.  Posterior decompression with laminectomy posterior interbody arthrodesis L4-L5. Posterior fixation L4-L5 with posterior lateral arthrodesis L4-L5.  Earleen Newport, MD 08/21/2016, 12:07 PM

## 2016-08-21 NOTE — Transfer of Care (Signed)
Immediate Anesthesia Transfer of Care Note  Patient: Sharon Logan  Procedure(s) Performed: Procedure(s): Lumbar four-five  Posterior lumbar interbody fusion (N/A)  Patient Location: PACU  Anesthesia Type:General  Level of Consciousness: awake and patient cooperative  Airway & Oxygen Therapy: Patient Spontanous Breathing  Post-op Assessment: Report given to RN and Post -op Vital signs reviewed and stable  Post vital signs: Reviewed and stable  Last Vitals:  Vitals:   08/21/16 0842  BP: 138/66  Pulse: (!) 54  Resp: 14  Temp: 36.4 C    Last Pain:  Vitals:   08/21/16 0842  TempSrc: Oral  PainSc: 4       Patients Stated Pain Goal: 6 (75/79/72 8206)  Complications: No apparent anesthesia complications

## 2016-08-21 NOTE — Anesthesia Preprocedure Evaluation (Addendum)
Anesthesia Evaluation  Patient identified by MRN, date of birth, ID band Patient awake    Reviewed: Allergy & Precautions, NPO status , Patient's Chart, lab work & pertinent test results  Airway Mallampati: II  TM Distance: >3 FB     Dental   Pulmonary    breath sounds clear to auscultation       Cardiovascular negative cardio ROS   Rhythm:Regular Rate:Normal     Neuro/Psych  Headaches,    GI/Hepatic negative GI ROS, Neg liver ROS,   Endo/Other  negative endocrine ROS  Renal/GU negative Renal ROS     Musculoskeletal   Abdominal   Peds  Hematology negative hematology ROS (+)   Anesthesia Other Findings   Reproductive/Obstetrics                             Anesthesia Physical Anesthesia Plan  ASA: III  Anesthesia Plan: General   Post-op Pain Management:    Induction: Intravenous  Airway Management Planned: Oral ETT  Additional Equipment:   Intra-op Plan:   Post-operative Plan: Extubation in OR  Informed Consent: I have reviewed the patients History and Physical, chart, labs and discussed the procedure including the risks, benefits and alternatives for the proposed anesthesia with the patient or authorized representative who has indicated his/her understanding and acceptance.   Dental advisory given  Plan Discussed with: CRNA and Anesthesiologist  Anesthesia Plan Comments:         Anesthesia Quick Evaluation

## 2016-08-21 NOTE — Progress Notes (Signed)
Discharged instructions/education/AVS given to patient with daughter at bedside and they both verbalized understanding. Patient MAE well, ambulated with walker in the hallway well. No drainage, no redness, no swelling noted on incision site. Patient voiding well. Discharge via wheelchair.

## 2016-08-21 NOTE — Anesthesia Procedure Notes (Signed)
Procedure Name: Intubation Date/Time: 08/21/2016 12:25 PM Performed by: Lance Coon Pre-anesthesia Checklist: Patient identified, Emergency Drugs available, Suction available, Timeout performed and Patient being monitored Patient Re-evaluated:Patient Re-evaluated prior to inductionOxygen Delivery Method: Circle system utilized Preoxygenation: Pre-oxygenation with 100% oxygen Intubation Type: IV induction Ventilation: Mask ventilation without difficulty Laryngoscope Size: Miller and 3 Grade View: Grade I Tube size: 7.0 mm Number of attempts: 1 Airway Equipment and Method: Stylet Placement Confirmation: ETT inserted through vocal cords under direct vision,  positive ETCO2 and breath sounds checked- equal and bilateral Secured at: 21 cm Tube secured with: Tape Dental Injury: Teeth and Oropharynx as per pre-operative assessment

## 2016-08-21 NOTE — Op Note (Signed)
Date of surgery: 08/21/2016 Preoperative diagnosis: Spondylolisthesis L4-L5 with lumbar radiculopathy, spinal stenosis. Post operative diagnosis: Same Procedure: L4-L5 decompressive laminectomy decompression of L4 and L5 nerve roots, posterior lumbar interbody arthrodesis with peek spacers local autograft and allograft, pedicle screw fixation L4-L5, posterior lateral arthrodesis L4-L5  Surgeon: Kristeen Miss M.D.  Asst.: Francesca Jewett M.D.  Indications: Patient is Sharon Logan is a 61 y.o. female who who's had significant back pain and lumbar radiculopathy for over a years period time. A lumbar MRI demonstrates advanced spondylolisthesis with high-grade canal stenosis. she was advised regarding surgical intervention.  Procedure: The patient was brought to the operating room supine on a stretcher. After the smooth induction of general endotracheal anesthesia she was turned prone and the back was prepped with alcohol and DuraPrep. The back was then draped sterilely. A midline incision was created and carried down to the lumbar dorsal fascia. A localizing radiograph identified the L4 and L5 spinous processes. A subligamentous dissection was created at L4 and L5 to expose the interlaminar space at L4 and L5 and the facet joints over the L4-L5 interspace. Laminotomies were were then created removing the entire inferior margin of the lamina of L4 including the inferior facet at the L4-L5 joint. The yellow ligament was taken up and the common dural tube was exposed along with the L4 nerve root superiorly, and the L5 nerve root inferiorly, the disc space was exposed and epidural veins in this region were cauterized and divided. The L4 nerve roots and the L5 nerve root were dissected with care taken to protect them. The disc space was opened and a combination of curettes and rongeurs was used to evacuate the disc space fully. The endplates were removed using sharp curettes. An interbody spacer was placed to distract  the disc space while the contralateral discectomy was performed. When the entirety of the disc was removed and the endplates were prepared final sizing of the disc space was obtained 10 mm peek spacers 8 of lordosis and 25 mm in length were chosen and packed with autograft and allograft and placed into the interspace. The remainder of the interspace was packed with autograft and allograft. Pedicle entry sites were then chosen using fluoroscopic guidance and 0.5 x 45 mm screws were placed in L4 and 6.5 x 45 mm screws were placed in L5. The lateral gutters were decorticated and graft was packed in the posterolateral gutters between L4 and L5. Final radiographs were obtained after placing appropriately sized rods between the pedicle screws at L4-L5 and torquing these to the appropriate tension. The surgical site was inspected carefully to assure the L4 and L5 nerve roots were well decompressed, hemostasis was obtained, and the graft was well packed. Then the retractors were removed and the wound was closed with #1 Vicryl in the lumbar dorsal fascia 2-0 Vicryl in the subcutaneous tissue and 3-0 Vicryl subcuticularly. When he cc of half percent Marcaine was injected into the paraspinous musculature at the time of closure. Blood loss was estimated at 250 cc. The patient tolerated procedure well and was returned to the recovery room in stable condition.

## 2016-08-21 NOTE — Progress Notes (Signed)
Patient ID: Sharon Logan, female   DOB: 15-Oct-1955, 61 y.o.   MRN: [id number] Vital signs are stable Patients feels well Motor function is good Dressing is clean and dry We'll mobilize today May be discharged tomorrow if feeling well

## 2016-08-21 NOTE — Progress Notes (Signed)
Orthopedic Tech Progress Note Patient Details:  Sharon Logan 14-Jun-1956 [id number] Patient has brace. Patient ID: Sharon Logan, female   DOB: 1956-05-26, 61 y.o.   MRN: [id number]   Braulio Bosch 08/21/2016, 8:03 PM

## 2016-08-22 ENCOUNTER — Encounter (HOSPITAL_COMMUNITY): Payer: Self-pay

## 2016-08-22 NOTE — Discharge Instructions (Signed)
Spinal Fusion, Care After °These instructions give you information about caring for yourself after your procedure. Your doctor may also give you more specific instructions. Call your doctor if you have any problems or questions after your procedure. °Follow these instructions at home: °Medicines  °· Take over-the-counter and prescription medicines only as told by your doctor. These include any medicines for pain. °· Do not drive for 24 hours if you received a sedative. °· Do not drive or use heavy machinery while taking prescription pain medicine. °· If you were prescribed an antibiotic medicine, take it as told by your doctor. Do not stop taking the antibiotic even if you start to feel better. °Surgical Cut (Incision) Care  °· Follow instructions from your doctor about how to take care of your surgical cut. Make sure you: °¨ Wash your hands with soap and water before you change your bandage (dressing). If you cannot use soap and water, use hand sanitizer. °¨ Change your bandage as told by your doctor. °¨ Leave stitches (sutures), skin glue, or skin tape (adhesive) strips in place. They may need to stay in place for 2 weeks or longer. If tape strips get loose and curl up, you may trim the loose edges. Do not remove tape strips completely unless your doctor says it is okay. °· Keep your surgical cut clean and dry. Do not take baths, swim, or use a hot tub until your doctor says it is okay. °· Check your surgical cut and the area around it every day for: °¨ Redness. °¨ Swelling. °¨ Fluid. °Physical Activity  °· Return to your normal activities as told by your doctor. Ask your doctor what activities are safe for you. Rest and protect your back as much as you can. °· Follow instructions from your doctor about how to move. Use good posture to help your spine heal. °· Do not lift anything that is heavier than 8 lb (3.6 kg) or as told by your doctor until he or she says that it is safe. Do not lift anything over your  head. °· Do not twist or bend at the waist until your doctor says it is okay. °· Avoid pushing or pulling motions. °· Do not sit or lie down in the same position for long periods of time. °· Do not start to exercise until your doctor says it is okay. Ask your doctor what kinds of exercise you can do to make your back stronger. °General instructions  °· If you were given a brace, use it as told by your doctor. °· Wear compression stockings as told by your doctor. °· Do not use tobacco products. These include cigarettes, chewing tobacco, or e-cigarettes. If you need help quitting, ask your doctor. °· Keep all follow-up visits as told by your doctor. This is important. This includes any visits with your physical therapist, if this applies. °Contact a doctor if: °· Your pain gets worse. °· Your medicine does not help your pain. °· Your legs or feet become painful or swollen. °· Your surgical cut is red, swollen, or painful. °· You have fluid, blood, or pus coming from your surgical cut. °· You feel sick to your stomach (nauseous). °· You throw up (vomit). °· Your have weakness or loss of feeling (numbness) in your legs that is new or getting worse. °· You have a fever. °· You have trouble controlling when you pee (urinate) or poop (have a bowel movement). °Get help right away if: °· Your pain is very   bad. °· You have chest pain. °· You have trouble breathing. °· You start to have a cough. °These symptoms may be an emergency. Do not wait to see if the symptoms will go away. Get medical help right away. Call your local emergency services (911 in the [place] Do not drive yourself to the hospital. °This information is not intended to replace advice given to you by your health care provider. Make sure you discuss any questions you have with your health care provider. °Document Released: 09/25/2010 Document Revised: 01/28/2016 Document Reviewed: 11/14/2014 °Elsevier Interactive Patient Education © 2017 [employer] ° °

## 2016-08-22 NOTE — Evaluation (Addendum)
Occupational Therapy Evaluation and Discharge Patient Details Name: Sharon Logan MRN: [id number] DOB: 1956-04-23 Today's Date: 08/22/2016    History of Present Illness Pt is a 61 y/o female who presents s/p L4-L5 posterior lumbar fusion on 08/21/16.   Clinical Impression   Pt reports she was independent with ADL PTA. Currently pt overall mod I for ADL and functional mobility. All back, safety, and ADL education completed with pt; she verbalized and demonstrated understanding. Pt planning to d/c home with 24/7 supervision from family initially then intermittent supervision from family and friends. No further acute OT needs identified; signing off at this time. Please re-consult if needs change. Thank you for this referral.    Follow Up Recommendations  No OT follow up;Supervision - Intermittent    Equipment Recommendations  3 in 1 bedside commode (has already been delivered to room)    Recommendations for Other Services       Precautions / Restrictions Precautions Precautions: Fall;Back Precaution Booklet Issued: No Precaution Comments: Pt able to recall 3/3 precautions. Required Braces or Orthoses: Spinal Brace Spinal Brace: Lumbar corset;Applied in sitting position Restrictions Weight Bearing Restrictions: No      Mobility Bed Mobility Overal bed mobility: Modified Independent             General bed mobility comments: Good log roll technique with HOB flat  Transfers Overall transfer level: Modified independent Equipment used: None                  Balance Overall balance assessment: No apparent balance deficits (not formally assessed) Sitting-balance support: Feet supported;No upper extremity supported Sitting balance-Leahy Scale: Good     Standing balance support: No upper extremity supported;During functional activity Standing balance-Leahy Scale: Fair                              ADL Overall ADL's : Modified independent                                       General ADL Comments: Pt able to complete ADL and transfers at mod I level. Educated on 2 cups for oral care, LB ADL technique, bed mobility, donning brace, frequent mobility throughout the day upon return home, keeping frequently used items at counter top height, peri care technique without twisting.     Vision         Perception     Praxis      Pertinent Vitals/Pain Pain Assessment: Faces Pain Score: 3  Faces Pain Scale: Hurts a little bit Pain Location: incision Pain Descriptors / Indicators: Sore Pain Intervention(s): Monitored during session     Hand Dominance Right   Extremity/Trunk Assessment Upper Extremity Assessment Upper Extremity Assessment: Overall WFL for tasks assessed   Lower Extremity Assessment Lower Extremity Assessment: Defer to PT evaluation   Cervical / Trunk Assessment Cervical / Trunk Assessment: Other exceptions Cervical / Trunk Exceptions: s/p lumbar surgery   Communication Communication Communication: No difficulties   Cognition Arousal/Alertness: Awake/alert Behavior During Therapy: WFL for tasks assessed/performed Overall Cognitive Status: Within Functional Limits for tasks assessed                     General Comments       Exercises       Shoulder Instructions      Home Living Family/patient expects  to be discharged to:: Private residence Living Arrangements: Alone Available Help at Discharge: Family;Available PRN/intermittently (daughter 24/7 for first 3 days, then neighbor will check in) Type of Home: House Home Access: Level entry     Home Layout: One level Alternate Level Stairs-Number of Steps: Flight Alternate Level Stairs-Rails: Right;Left Bathroom Shower/Tub: Occupational psychologist: Standard     Home Equipment: Cane - single point;Adaptive equipment Adaptive Equipment: Reacher Additional Comments: Pt has a walk in shower at her home. Information above is  for daughters home which she will be at for 3 days      Prior Functioning/Environment Level of Independence: Independent                 OT Problem List:        OT Treatment/Interventions:      OT Goals(Current goals can be found in the care plan section) Acute Rehab OT Goals Patient Stated Goal: Home today OT Goal Formulation: All assessment and education complete, DC therapy  OT Frequency:     Barriers to D/C:            Co-evaluation              End of Session Equipment Utilized During Treatment: Back brace Nurse Communication: Mobility status  Activity Tolerance: Patient tolerated treatment well Patient left: in bed;Other (comment);with call bell/phone within reach (sitting EOB)  OT Visit Diagnosis: Pain Pain - part of body:  (back)                ADL either performed or assessed with clinical judgement  Time: 1027-1036 OT Time Calculation (min): 9 min Charges:  OT General Charges $OT Visit: 1 Procedure OT Evaluation $OT Eval Low Complexity: 1 Procedure G-Codes:     Elouise Divelbiss A. Ulice Brilliant, M.S., OTR/L Pager: [place] 08/22/2016, 11:32 AM

## 2016-08-22 NOTE — Evaluation (Signed)
Physical Therapy Evaluation Patient Details Name: Sharon Logan MRN: [id number] DOB: 29-May-1956 Today's Date: 08/22/2016   History of Present Illness  Pt is a 61 y/o female who presents s/p L4-L5 posterior lumbar fusion on 08/21/16.  Clinical Impression  Patient evaluated by Physical Therapy with no further acute PT needs identified. All education has been completed and the patient has no further questions. At the time of PT eval pt was able to perform transfers and ambulation with modified independence, and completed stair training with supervision for safety. See below for any follow-up Physical Therapy or equipment needs. PT is signing off. Thank you for this referral.     Follow Up Recommendations No PT follow up    Equipment Recommendations  3in1 (PT)    Recommendations for Other Services       Precautions / Restrictions Precautions Precautions: Fall;Back Precaution Booklet Issued: Yes (comment) Precaution Comments: Reviewed in detail and pt was cued for precautions during functional mobility.  Required Braces or Orthoses: Spinal Brace Spinal Brace: Lumbar corset;Applied in sitting position Restrictions Weight Bearing Restrictions: No      Mobility  Bed Mobility               General bed mobility comments: Pt sitting up on EOB when PT arrived. States she has been doing the log roll and declined practice. Pt was instructed in proper technique verbally.   Transfers Overall transfer level: Modified independent Equipment used: None                Ambulation/Gait Ambulation/Gait assistance: Modified independent (Device/Increase time) Ambulation Distance (Feet): 400 Feet Assistive device: None Gait Pattern/deviations: Step-through pattern;Decreased stride length;Drifts right/left Gait velocity: Slightly decreased Gait velocity interpretation: Below normal speed for age/gender General Gait Details: No unsteadiness or LOB noted.   Stairs Stairs: Yes Stairs  assistance: Supervision Stair Management: One rail Left;Alternating pattern;Forwards Number of Stairs: 15 (5 and then 10) General stair comments: VC's for general safety. Light supervision for safety but no assist required.   Wheelchair Mobility    Modified Rankin (Stroke Patients Only)       Balance Overall balance assessment: Needs assistance Sitting-balance support: Feet supported;No upper extremity supported Sitting balance-Leahy Scale: Good     Standing balance support: No upper extremity supported;During functional activity Standing balance-Leahy Scale: Fair                               Pertinent Vitals/Pain Pain Assessment: 0-10 Pain Score: 3  Pain Location: Incision site Pain Descriptors / Indicators: Operative site guarding Pain Intervention(s): Monitored during session;Repositioned    Home Living Family/patient expects to be discharged to:: Private residence Living Arrangements: Alone Available Help at Discharge: Family;Available PRN/intermittently (Daughter for first 3 days and then neighbor will check on he) Type of Home: House Home Access: Level entry     Home Layout: Two level Home Equipment: Cane - single point;Adaptive equipment Additional Comments: Pt has a walk in shower at her home. Information above is for daughters home which she will be at for 3 days    Prior Function Level of Independence: Independent               Hand Dominance   Dominant Hand: Right    Extremity/Trunk Assessment   Upper Extremity Assessment Upper Extremity Assessment: Defer to OT evaluation;Overall [facility] for tasks assessed    Lower Extremity Assessment Lower Extremity Assessment: Overall WFL for tasks assessed  Cervical / Trunk Assessment Cervical / Trunk Assessment: Other exceptions Cervical / Trunk Exceptions: s/p lumbar surgery  Communication   Communication: No difficulties  Cognition Arousal/Alertness: Awake/alert Behavior During  Therapy: WFL for tasks assessed/performed Overall Cognitive Status: Within Functional Limits for tasks assessed                      General Comments      Exercises     Assessment/Plan    PT Assessment Patent does not need any further PT services  PT Problem List         PT Treatment Interventions      PT Goals (Current goals can be found in the Care Plan section)  Acute Rehab PT Goals Patient Stated Goal: Home today PT Goal Formulation: All assessment and education complete, DC therapy    Frequency     Barriers to discharge        Co-evaluation               End of Session Equipment Utilized During Treatment: Back brace Activity Tolerance: Patient tolerated treatment well Patient left: with call bell/phone within reach (Sitting EOB with brace donned) Nurse Communication: Mobility status PT Visit Diagnosis: Unsteadiness on feet (R26.81)         Time: 1594-5859 PT Time Calculation (min) (ACUTE ONLY): 19 min   Charges:   PT Evaluation $PT Eval Moderate Complexity: 1 Procedure     PT G CodesThelma Comp 08/22/2016, 10:43 AM   Rolinda Roan, PT, DPT Acute Rehabilitation Services Pager: [phone]

## 2016-08-22 NOTE — Care Management Note (Signed)
Case Management Note  Patient Details  Name: Sharon Logan MRN: [id number] Date of Birth: 1955-06-26  Subjective/Objective:                 Spoke with patient at the bedside, she states she will go home and stay with her daughter. Daughter will provide transport home, arriving btwn 2-4pm. CM ordered [facility], referral made to [place], clinical liaison. Will be delivered to room prior to DC. Patient states PT did not rec HH, no note as of yet, patient is ambulating in room well. Patient deferred need for [facility] PT.    Action/Plan:  DC to home with daughter, [facility] will be delivered to room prior to DC.  Expected Discharge Date:  08/22/16               Expected Discharge Plan:  Home/Self Care  In-House Referral:     Discharge planning Services  CM Consult  Post Acute Care Choice:  Durable Medical Equipment Choice offered to:  Patient  DME Arranged:  Bedside commode DME Agency:  [place]:    [facility] Agency:     Status of Service:  Completed, signed off  If discussed at [place] of Stay Meetings, dates discussed:    Additional Comments:  Carles Collet, RN 08/22/2016, 9:42 AM

## 2016-08-22 NOTE — Progress Notes (Signed)
Vitals:   08/21/16 1943 08/21/16 2240 08/22/16 0006 08/22/16 0457  BP: 125/65 111/67 (!) 113/55 (!) 101/49  Pulse: 70 81 71 62  Resp: 18 18 18 18   Temp: 97.7 F (36.5 C) 97.7 F (36.5 C) 97.9 F (36.6 C) 97.5 F (36.4 C)  TempSrc: Oral Oral Oral Oral  SpO2: 100% 98% 99% 93%     Patient up and ambulating. Dressing clean and dry. Comfortable. For discharge later today per Dr. Ellene Route.  Plan: Surgeon structures reviewed with patient. Encouraged to ambulate.  Hosie Spangle, MD 08/22/2016, 8:15 AM

## 2016-08-22 NOTE — Progress Notes (Signed)
Patient given discharge instructions.  All questions and concerns addressed.  Patient left unit by wheelchair.

## 2016-08-24 MED FILL — Heparin Sodium (Porcine) Inj 1000 Unit/ML: INTRAMUSCULAR | Qty: 30 | Status: AC

## 2016-08-24 MED FILL — Sodium Chloride IV Soln 0.9%: INTRAVENOUS | Qty: 1000 | Status: AC

## 2016-10-30 DIAGNOSIS — M5136 Other intervertebral disc degeneration, lumbar region: Secondary | ICD-10-CM | POA: Insufficient documentation

## 2017-10-04 DIAGNOSIS — M4800 Spinal stenosis, site unspecified: Secondary | ICD-10-CM

## 2017-10-04 DIAGNOSIS — IMO0002 Reserved for concepts with insufficient information to code with codable children: Secondary | ICD-10-CM | POA: Insufficient documentation

## 2017-10-18 DIAGNOSIS — G253 Myoclonus: Secondary | ICD-10-CM | POA: Insufficient documentation

## 2018-03-16 ENCOUNTER — Ambulatory Visit (INDEPENDENT_AMBULATORY_CARE_PROVIDER_SITE_OTHER): Payer: Federal, State, Local not specified - PPO | Admitting: Psychiatry

## 2018-03-16 DIAGNOSIS — F4310 Post-traumatic stress disorder, unspecified: Secondary | ICD-10-CM
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
## 2018-04-04 DIAGNOSIS — E785 Hyperlipidemia, unspecified: Secondary | ICD-10-CM | POA: Insufficient documentation

## 2018-04-04 DIAGNOSIS — R5383 Other fatigue: Secondary | ICD-10-CM

## 2018-04-04 DIAGNOSIS — R5382 Chronic fatigue, unspecified: Secondary | ICD-10-CM | POA: Insufficient documentation

## 2018-04-04 DIAGNOSIS — F4310 Post-traumatic stress disorder, unspecified: Secondary | ICD-10-CM | POA: Insufficient documentation

## 2018-04-04 DIAGNOSIS — K219 Gastro-esophageal reflux disease without esophagitis: Secondary | ICD-10-CM | POA: Insufficient documentation

## 2018-04-04 DIAGNOSIS — R209 Unspecified disturbances of skin sensation: Secondary | ICD-10-CM | POA: Insufficient documentation

## 2018-04-04 DIAGNOSIS — G479 Sleep disorder, unspecified: Secondary | ICD-10-CM | POA: Insufficient documentation

## 2018-04-04 DIAGNOSIS — E559 Vitamin D deficiency, unspecified: Secondary | ICD-10-CM | POA: Insufficient documentation

## 2018-04-04 DIAGNOSIS — M949 Disorder of cartilage, unspecified: Secondary | ICD-10-CM

## 2018-04-04 DIAGNOSIS — M25579 Pain in unspecified ankle and joints of unspecified foot: Secondary | ICD-10-CM | POA: Insufficient documentation

## 2018-04-04 DIAGNOSIS — R5381 Other malaise: Secondary | ICD-10-CM | POA: Insufficient documentation

## 2018-04-04 DIAGNOSIS — M899 Disorder of bone, unspecified: Secondary | ICD-10-CM | POA: Insufficient documentation

## 2018-04-04 DIAGNOSIS — M79606 Pain in leg, unspecified: Secondary | ICD-10-CM | POA: Insufficient documentation

## 2018-04-04 DIAGNOSIS — I7300 Raynaud's syndrome without gangrene: Secondary | ICD-10-CM | POA: Insufficient documentation

## 2018-04-04 DIAGNOSIS — M79609 Pain in unspecified limb: Secondary | ICD-10-CM | POA: Insufficient documentation

## 2018-04-20 ENCOUNTER — Ambulatory Visit: Payer: Federal, State, Local not specified - PPO | Admitting: Psychiatry

## 2018-04-27 ENCOUNTER — Encounter: Payer: Self-pay | Admitting: Emergency Medicine

## 2018-05-16 ENCOUNTER — Ambulatory Visit (INDEPENDENT_AMBULATORY_CARE_PROVIDER_SITE_OTHER): Payer: Federal, State, Local not specified - PPO | Admitting: Psychiatry

## 2018-05-16 ENCOUNTER — Ambulatory Visit: Payer: Federal, State, Local not specified - PPO | Admitting: Psychiatry

## 2018-05-16 ENCOUNTER — Encounter: Payer: Self-pay | Admitting: Psychiatry

## 2018-05-16 VITALS — BP 129/69 | HR 60 | Ht 64.0 in | Wt 142.0 lb

## 2018-05-16 DIAGNOSIS — F330 Major depressive disorder, recurrent, mild: Secondary | ICD-10-CM | POA: Diagnosis not present

## 2018-05-16 DIAGNOSIS — F4310 Post-traumatic stress disorder, unspecified: Secondary | ICD-10-CM

## 2018-05-16 MED ORDER — ESCITALOPRAM OXALATE 20 MG PO TABS: 20.0000 mg | ORAL_TABLET | Freq: Every day | ORAL | 0 refills | Status: DC
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
## 2018-05-18 ENCOUNTER — Encounter

## 2018-05-25 ENCOUNTER — Ambulatory Visit (INDEPENDENT_AMBULATORY_CARE_PROVIDER_SITE_OTHER): Payer: Federal, State, Local not specified - PPO | Admitting: Psychiatry

## 2018-05-25 ENCOUNTER — Encounter: Payer: Self-pay | Admitting: Psychiatry

## 2018-05-25 DIAGNOSIS — F330 Major depressive disorder, recurrent, mild: Secondary | ICD-10-CM

## 2018-05-25 NOTE — Progress Notes (Signed)
      [facility] Counselor/Therapist Progress Note  Patient ID: Sharon Logan, MRN: [id number],    Date: 05/25/2018  Time Spent: 58 minutes   Treatment Type: Individual Therapy  Reported Symptoms: Anxious Mood  Mental Status Exam:  Appearance:   Well Groomed     Behavior:  Appropriate  Motor:  Normal  Speech/Language:   Clear and Coherent  Affect:  Appropriate  Mood:  anxious and sad  Thought process:  normal  Thought content:    WNL  Sensory/Perceptual disturbances:    WNL  Orientation:  oriented to person, place, time/date and situation  Attention:  Good  Concentration:  Good  Memory:  WNL  Fund of knowledge:   Good  Insight:    Good  Judgment:   Good  Impulse Control:  Good   Risk Assessment: Danger to Self:  No Self-injurious Behavior: No Danger to Others: No Duty to Warn:no Physical Aggression / Violence:No  Access to Firearms a concern: No  Gang Involvement:No   Subjective: The client reports that she is liking the townhome she recently purchased.  The homeowners association has not contacted her on the fence.  She will try to call them this week (assertiveness).  Today the client wanted to talk about her deceased husband.  She was married for 14 years.  She describes it as a good marriage but his alcohol use killed it.  Right as he was diagnosed with throat cancer he had his fourth DUI.  He was going to have to go to jail but that was delayed due to the chemo and radiation that he was undergoing.  6 weeks later he had died in his sleep. The client discussed how much she took on the chin and accepting his alcoholic behavior.  His family was not kind to her either.  As we discussed this we went through issues around boundaries and assertiveness.  This has been thematic for the client across her life span.  She continues to work on these reviewing boundaries and Freight forwarder.  She is much more comfortable asking for what she want and being able to say  no.  Interventions: Assertiveness/Communication, Solution-Oriented/Positive Psychology, Eye Movement Desensitization and Reprocessing (EMDR) and Insight-Oriented  Diagnosis:   ICD-10-CM   1. Mild episode of recurrent major depressive disorder ([place]) F33.0     Plan: Self care, positive self talk, boundaries, assertiveness.  Albertina Parr Mehtab Dolberry, [place]

## 2018-06-01 ENCOUNTER — Encounter: Payer: Self-pay | Admitting: Psychiatry

## 2018-06-01 ENCOUNTER — Ambulatory Visit (INDEPENDENT_AMBULATORY_CARE_PROVIDER_SITE_OTHER): Payer: Federal, State, Local not specified - PPO | Admitting: Psychiatry

## 2018-06-01 DIAGNOSIS — F330 Major depressive disorder, recurrent, mild: Secondary | ICD-10-CM
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
## 2018-06-13 ENCOUNTER — Encounter: Payer: Self-pay | Admitting: Psychiatry

## 2018-06-13 ENCOUNTER — Ambulatory Visit (INDEPENDENT_AMBULATORY_CARE_PROVIDER_SITE_OTHER): Payer: Federal, State, Local not specified - PPO | Admitting: Psychiatry

## 2018-06-13 DIAGNOSIS — F4310 Post-traumatic stress disorder, unspecified: Secondary | ICD-10-CM | POA: Diagnosis not present
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
## 2018-06-14 ENCOUNTER — Ambulatory Visit: Payer: Federal, State, Local not specified - PPO | Admitting: Psychiatry

## 2018-06-21 ENCOUNTER — Ambulatory Visit: Payer: Federal, State, Local not specified - PPO | Admitting: Psychiatry

## 2018-06-28 ENCOUNTER — Ambulatory Visit: Payer: Federal, State, Local not specified - PPO | Admitting: Psychiatry

## 2018-07-01 DIAGNOSIS — G8929 Other chronic pain: Secondary | ICD-10-CM | POA: Insufficient documentation

## 2018-07-01 DIAGNOSIS — M5442 Lumbago with sciatica, left side: Secondary | ICD-10-CM

## 2018-07-01 DIAGNOSIS — M5441 Lumbago with sciatica, right side: Secondary | ICD-10-CM

## 2018-07-05 ENCOUNTER — Ambulatory Visit: Payer: Federal, State, Local not specified - PPO | Admitting: Psychiatry

## 2018-07-05 ENCOUNTER — Ambulatory Visit (INDEPENDENT_AMBULATORY_CARE_PROVIDER_SITE_OTHER): Payer: Federal, State, Local not specified - PPO | Admitting: Podiatry

## 2018-07-05 ENCOUNTER — Ambulatory Visit (INDEPENDENT_AMBULATORY_CARE_PROVIDER_SITE_OTHER): Payer: Federal, State, Local not specified - PPO

## 2018-07-05 ENCOUNTER — Encounter: Payer: Self-pay | Admitting: Podiatry

## 2018-07-05 VITALS — BP 112/66 | HR 57 | Resp 16

## 2018-07-05 DIAGNOSIS — M2020 Hallux rigidus, unspecified foot: Secondary | ICD-10-CM

## 2018-07-05 DIAGNOSIS — M79675 Pain in left toe(s): Secondary | ICD-10-CM

## 2018-07-05 DIAGNOSIS — M2142 Flat foot [pes planus] (acquired), left foot: Secondary | ICD-10-CM

## 2018-07-05 DIAGNOSIS — M722 Plantar fascial fibromatosis: Secondary | ICD-10-CM | POA: Diagnosis not present

## 2018-07-05 DIAGNOSIS — M2141 Flat foot [pes planus] (acquired), right foot: Secondary | ICD-10-CM | POA: Diagnosis not present

## 2018-07-05 DIAGNOSIS — M19079 Primary osteoarthritis, unspecified ankle and foot: Secondary | ICD-10-CM

## 2018-07-05 DIAGNOSIS — M79674 Pain in right toe(s): Secondary | ICD-10-CM

## 2018-07-05 DIAGNOSIS — M205X9 Other deformities of toe(s) (acquired), unspecified foot: Secondary | ICD-10-CM

## 2018-07-05 MED ORDER — METHYLPREDNISOLONE 4 MG PO TBPK: ORAL_TABLET | ORAL | 0 refills | Status: DC

## 2018-07-05 MED ORDER — LIDOCAINE 1.8 % EX PTCH: 1.0000 | MEDICATED_PATCH | Freq: Every day | CUTANEOUS | 0 refills | Status: DC

## 2018-07-05 NOTE — Progress Notes (Signed)
Subjective:    Patient ID: Sharon Logan, female    DOB: March 01, 1956, 63 y.o.   MRN: [id number]  HPI 63 year old female presents the office today for concerns of pain on the tops as well as the arch of her foot as well as to the joints of both of her big toes.  She says the right side hurts more in her entire big toe hurts.  She states that it is worse with walking.  She has a history of bilateral bunion surgery with the right foot done in 2013 the left foot 1998.  She denies any recent injury or trauma to her feet.  She is some occasional swelling but no numbness or tingling.  She has been using lidocaine patches which is helpful and she is asking for refill.   Review of Systems  All other systems reviewed and are negative.  Past Medical History:  Diagnosis Date  . Allergic rhinitis   . Anxiety   . Chronic pain   . Chronically dry eyes   . Depression   . Fibromyalgia   . Headache     Past Surgical History:  Procedure Laterality Date  . ABDOMINAL HYSTERECTOMY    . BACK SURGERY    . BUNIONECTOMY Bilateral   . NASAL SINUS SURGERY     x2  . SKIN CANCER EXCISION     face x3     Current Outpatient Medications:  .  Carboxymethylcellulose Sodium (THERATEARS OP), Apply 1 drop to eye daily as needed (dry eyes)., Disp: , Rfl:  .  eletriptan (RELPAX) 40 MG tablet, Take 40 mg by mouth as needed for migraine or headache. May repeat in 2 hours if headache persists or recurs., Disp: , Rfl:  .  escitalopram (LEXAPRO) 20 MG tablet, Take 1 tablet (20 mg total) by mouth daily., Disp: 90 tablet, Rfl: 0 .  fluticasone (FLONASE) 50 MCG/ACT nasal spray, Place 2 sprays into both nostrils 2 (two) times daily as needed for allergies or rhinitis., Disp: , Rfl:  .  naproxen (NAPROSYN) 500 MG tablet, Take 500 mg by mouth 2 (two) times daily with a meal., Disp: , Rfl:  .  OVER THE COUNTER MEDICATION, Take 2 capsules by mouth 2 (two) times daily as needed (allergies). Sinus Support EF, Disp: , Rfl:  .   saccharomyces boulardii (FLORASTOR) 250 MG capsule, Take 500 mg by mouth daily., Disp: , Rfl:  .  Lidocaine 1.8 % PTCH, Apply 1 patch topically daily., Disp: 30 patch, Rfl: 0 .  methylPREDNISolone (MEDROL DOSEPAK) 4 MG TBPK tablet, Take as directed, Disp: 21 tablet, Rfl: 0  Allergies  Allergen Reactions  . Gabapentin Other (See Comments)  . Polymyxin B Other (See Comments)    Eyes go blood red  . Cymbalta [Duloxetine Hcl] Other (See Comments)    Headaches, constipation  . Duloxetine Other (See Comments)    HEADACHES CONSTIPATION  . Lamotrigine Rash  . Other Other (See Comments)    OTOBIOTIC > RED EYES  . Pregabalin Rash    Itchy red rash on chest  . Prozac [Fluoxetine] Other (See Comments)    CONSTIPATION  . Zolpidem Rash    "sleep walk"         Objective:   Physical Exam General: AAO x3, NAD  Dermatological: Skin is warm, dry and supple bilateral. Nails x 10 are well manicured; remaining integument appears unremarkable at this time. There are no open sores, no preulcerative lesions, no rash or signs of infection present.  Vascular: Dorsalis Pedis artery and Posterior Tibial artery pedal pulses are 2/4 bilateral with immedate capillary fill time.  There is no pain with calf compression, swelling, warmth, erythema.   Neruologic: Grossly intact via light touch bilateral. Protective threshold with Semmes Wienstein monofilament intact to all pedal sites bilateral.  Negative Tinel sign.  Musculoskeletal: There is a decrease in medial arch upon weightbearing bilaterally.  There is too great change of motion of the first MPJs bilaterally and this is where she gets discomfort.  There is more discomfort MPJ range of motion.  Mild discomfort of the dorsal aspect of the foot as well as on the medial band plantar fashion the arch of the foot.  Plantar fascia, she has tendon, flexor, extensor tendons appear to be intact.  Muscular strength 5/5 in all groups tested bilateral.  Gait:  Unassisted, Nonantalgic.     Assessment & Plan:  63 year old female with capsulitis bilateral first MPJs, plantar fasciitis/arthritis -Treatment options discussed including all alternatives, risks, and complications -Etiology of symptoms were discussed -X-rays were obtained and reviewed with the patient.  History of bilateral bunion surgery present.  Arthritic changes present the first MPJ as well as the midfoot.  No evidence of acute fracture. -Prescribed a Medrol Dosepak for her as well as lidocaine patches. -I do think she will benefit from orthotics I will give her some support for compression of the first MPJs.  I had Liliane Channel evaluate her today as well and she was measured for inserts.  She like to proceed with this.  Return in about 3 weeks (around 07/26/2018).  Pick up orthotics  Trula Slade DPM

## 2018-07-12 ENCOUNTER — Ambulatory Visit: Payer: Federal, State, Local not specified - PPO | Admitting: Psychiatry

## 2018-07-14 ENCOUNTER — Telehealth: Payer: Self-pay | Admitting: Podiatry

## 2018-07-14 NOTE — Telephone Encounter (Signed)
Pt was seen last week and had a prescription for lidocaine patch sent to her pharmacy and stated that pharmacist is having issues with the prescription and needs for it to be resent.

## 2018-07-15 MED ORDER — LIDOCAINE 1.8 % EX PTCH: 1.0000 | MEDICATED_PATCH | Freq: Every day | CUTANEOUS | 0 refills | Status: DC

## 2018-07-15 NOTE — Addendum Note (Signed)
Addended by: Harriett Sine D on: 07/15/2018 01:30 PM   Modules accepted: Orders

## 2018-07-21 ENCOUNTER — Ambulatory Visit: Payer: Federal, State, Local not specified - PPO | Admitting: Psychiatry

## 2018-07-26 ENCOUNTER — Other Ambulatory Visit: Payer: Self-pay | Admitting: Podiatry

## 2018-07-26 ENCOUNTER — Ambulatory Visit (INDEPENDENT_AMBULATORY_CARE_PROVIDER_SITE_OTHER): Payer: Federal, State, Local not specified - PPO | Admitting: Orthotics

## 2018-07-26 ENCOUNTER — Ambulatory Visit: Payer: Federal, State, Local not specified - PPO | Admitting: Psychiatry

## 2018-07-26 DIAGNOSIS — M722 Plantar fascial fibromatosis: Secondary | ICD-10-CM

## 2018-07-26 DIAGNOSIS — M2020 Hallux rigidus, unspecified foot: Secondary | ICD-10-CM

## 2018-07-26 DIAGNOSIS — M205X9 Other deformities of toe(s) (acquired), unspecified foot: Secondary | ICD-10-CM

## 2018-07-26 DIAGNOSIS — M19079 Primary osteoarthritis, unspecified ankle and foot: Secondary | ICD-10-CM

## 2018-07-26 DIAGNOSIS — M2142 Flat foot [pes planus] (acquired), left foot: Secondary | ICD-10-CM

## 2018-07-26 DIAGNOSIS — M2141 Flat foot [pes planus] (acquired), right foot: Secondary | ICD-10-CM

## 2018-07-26 NOTE — Progress Notes (Signed)
Patient came in today to pick up custom made foot orthotics.  The goals were accomplished and the patient reported no dissatisfaction with said orthotics.  Patient was advised of breakin period and how to report any issues. 

## 2018-07-27 DIAGNOSIS — J329 Chronic sinusitis, unspecified: Secondary | ICD-10-CM

## 2018-07-27 DIAGNOSIS — K5900 Constipation, unspecified: Secondary | ICD-10-CM | POA: Insufficient documentation

## 2018-07-27 HISTORY — DX: Chronic sinusitis, unspecified: J32.9

## 2018-08-02 ENCOUNTER — Ambulatory Visit: Payer: Federal, State, Local not specified - PPO | Admitting: Psychiatry

## 2018-08-09 ENCOUNTER — Ambulatory Visit: Payer: Federal, State, Local not specified - PPO | Admitting: Psychiatry

## 2018-08-15 DIAGNOSIS — M5412 Radiculopathy, cervical region: Secondary | ICD-10-CM | POA: Insufficient documentation

## 2018-08-18 DIAGNOSIS — M48061 Spinal stenosis, lumbar region without neurogenic claudication: Secondary | ICD-10-CM | POA: Diagnosis not present

## 2018-08-18 DIAGNOSIS — Z791 Long term (current) use of non-steroidal anti-inflammatories (NSAID): Secondary | ICD-10-CM | POA: Diagnosis not present

## 2018-08-18 DIAGNOSIS — J0100 Acute maxillary sinusitis, unspecified: Secondary | ICD-10-CM | POA: Diagnosis not present

## 2018-08-19 DIAGNOSIS — M5412 Radiculopathy, cervical region: Secondary | ICD-10-CM | POA: Diagnosis not present

## 2018-08-23 DIAGNOSIS — M5412 Radiculopathy, cervical region: Secondary | ICD-10-CM | POA: Diagnosis not present

## 2018-08-26 DIAGNOSIS — M5412 Radiculopathy, cervical region: Secondary | ICD-10-CM | POA: Diagnosis not present

## 2018-08-29 DIAGNOSIS — M5412 Radiculopathy, cervical region: Secondary | ICD-10-CM | POA: Diagnosis not present

## 2018-09-13 DIAGNOSIS — M5412 Radiculopathy, cervical region: Secondary | ICD-10-CM | POA: Diagnosis not present

## 2018-09-13 DIAGNOSIS — M542 Cervicalgia: Secondary | ICD-10-CM | POA: Diagnosis not present

## 2018-09-15 ENCOUNTER — Ambulatory Visit (INDEPENDENT_AMBULATORY_CARE_PROVIDER_SITE_OTHER): Payer: Medicare Other | Admitting: Podiatry

## 2018-09-15 ENCOUNTER — Other Ambulatory Visit: Payer: Self-pay

## 2018-09-15 ENCOUNTER — Encounter: Payer: Self-pay | Admitting: Podiatry

## 2018-09-15 DIAGNOSIS — M19079 Primary osteoarthritis, unspecified ankle and foot: Secondary | ICD-10-CM | POA: Diagnosis not present

## 2018-09-15 DIAGNOSIS — M2141 Flat foot [pes planus] (acquired), right foot: Secondary | ICD-10-CM

## 2018-09-15 DIAGNOSIS — M779 Enthesopathy, unspecified: Secondary | ICD-10-CM | POA: Diagnosis not present

## 2018-09-15 DIAGNOSIS — M2142 Flat foot [pes planus] (acquired), left foot: Secondary | ICD-10-CM

## 2018-09-15 DIAGNOSIS — M2020 Hallux rigidus, unspecified foot: Secondary | ICD-10-CM

## 2018-09-15 DIAGNOSIS — M205X9 Other deformities of toe(s) (acquired), unspecified foot: Secondary | ICD-10-CM

## 2018-09-15 MED ORDER — TRIAMCINOLONE ACETONIDE 10 MG/ML IJ SUSP
10.0000 mg | Freq: Once | INTRAMUSCULAR | Status: AC
  Administered 2018-09-15: 10 mg

## 2018-09-19 NOTE — Progress Notes (Signed)
Subjective: 63 year old female presents the office today for follow-up evaluation of right foot and ankle pain.  She states that her ankle still feels like it is inflamed.  The inserts are not helping much and she feels that they are not touching the arch and they are too low.  She denies any recent injury or trauma.  This is been an ongoing issue for her.  She thinks this all started when she was on gabapentin and she was walking differently because of the side effects. Denies any systemic complaints such as fevers, chills, nausea, vomiting. No acute changes since last appointment, and no other complaints at this time.   Objective: AAO x3, NAD DP/PT pulses palpable bilaterally, CRT less than 3 seconds There is tenderness palpation of the anterior ankle joint line on the right side.  There is no pain or crepitation with ankle joint range of motion.  No pain on the extensor tendons.  There is a decrease range of motion of the first MPJ which is unchanged there is no pain to the first MPJ at this time. No open lesions or pre-ulcerative lesions.  No pain with calf compression, swelling, warmth, erythema  Assessment: Right ankle capsulitis, hallux limitus, extensor tendinitis  Plan: -All treatment options discussed with the patient including all alternatives, risks, complications.  -Today steroid injections performed the right ankle.  See procedure note below. -Trilock ankle brace dispensed.  -We are going to send the orthotics back in order to increase the arch. -Patient encouraged to call the office with any questions, concerns, change in symptoms.   Procedure: Injection intermediate joint  Discussed alternatives, risks, complications and verbal consent was obtained.  Location: RIGHT ankle Skin Prep: Betadine  Injectate: 0.5cc 0.5% marcaine plain, 0.5 cc 2% lidocaine plain and, 1 cc kenalog 10. Disposition: Patient tolerated procedure well. Injection site dressed with a band-aid.   Post-injection care was discussed and return precautions discussed.    Trula Slade DPM

## 2018-09-21 DIAGNOSIS — M5412 Radiculopathy, cervical region: Secondary | ICD-10-CM | POA: Diagnosis not present

## 2018-10-04 DIAGNOSIS — M5416 Radiculopathy, lumbar region: Secondary | ICD-10-CM | POA: Diagnosis not present

## 2018-10-04 DIAGNOSIS — M25512 Pain in left shoulder: Secondary | ICD-10-CM | POA: Diagnosis not present

## 2018-10-04 DIAGNOSIS — M5412 Radiculopathy, cervical region: Secondary | ICD-10-CM | POA: Diagnosis not present

## 2018-10-07 DIAGNOSIS — M5416 Radiculopathy, lumbar region: Secondary | ICD-10-CM | POA: Diagnosis not present

## 2018-10-13 ENCOUNTER — Other Ambulatory Visit: Payer: Self-pay

## 2018-10-13 ENCOUNTER — Ambulatory Visit (INDEPENDENT_AMBULATORY_CARE_PROVIDER_SITE_OTHER): Payer: Medicare Other | Admitting: Podiatry

## 2018-10-13 VITALS — Temp 97.3°F

## 2018-10-13 DIAGNOSIS — M2141 Flat foot [pes planus] (acquired), right foot: Secondary | ICD-10-CM | POA: Diagnosis not present

## 2018-10-13 DIAGNOSIS — M205X9 Other deformities of toe(s) (acquired), unspecified foot: Secondary | ICD-10-CM

## 2018-10-13 DIAGNOSIS — M722 Plantar fascial fibromatosis: Secondary | ICD-10-CM | POA: Diagnosis not present

## 2018-10-13 DIAGNOSIS — M19079 Primary osteoarthritis, unspecified ankle and foot: Secondary | ICD-10-CM | POA: Diagnosis not present

## 2018-10-13 DIAGNOSIS — M2020 Hallux rigidus, unspecified foot: Secondary | ICD-10-CM

## 2018-10-13 DIAGNOSIS — M2142 Flat foot [pes planus] (acquired), left foot: Secondary | ICD-10-CM | POA: Diagnosis not present

## 2018-10-13 MED ORDER — DICLOFENAC SODIUM 1 % TD GEL: 2.0000 g | Freq: Four times a day (QID) | TRANSDERMAL | 2 refills | Status: DC

## 2018-10-13 NOTE — Patient Instructions (Addendum)
Plantar Fasciitis (Heel Spur Syndrome) with Rehab The plantar fascia is a fibrous, ligament-like, soft-tissue structure that spans the bottom of the foot. Plantar fasciitis is a condition that causes pain in the foot due to inflammation of the tissue. SYMPTOMS   Pain and tenderness on the underneath side of the foot.  Pain that worsens with standing or walking. CAUSES  Plantar fasciitis is caused by irritation and injury to the plantar fascia on the underneath side of the foot. Common mechanisms of injury include:  Direct trauma to bottom of the foot.  Damage to a small nerve that runs under the foot where the main fascia attaches to the heel bone.  Stress placed on the plantar fascia due to bone spurs. RISK INCREASES WITH:   Activities that place stress on the plantar fascia (running, jumping, pivoting, or cutting).  Poor strength and flexibility.  Improperly fitted shoes.  Tight calf muscles.  Flat feet.  Failure to warm-up properly before activity.  Obesity. PREVENTION  Warm up and stretch properly before activity.  Allow for adequate recovery between workouts.  Maintain physical fitness:  Strength, flexibility, and endurance.  Cardiovascular fitness.  Maintain a health body weight.  Avoid stress on the plantar fascia.  Wear properly fitted shoes, including arch supports for individuals who have flat feet.  PROGNOSIS  If treated properly, then the symptoms of plantar fasciitis usually resolve without surgery. However, occasionally surgery is necessary.  RELATED COMPLICATIONS   Recurrent symptoms that may result in a chronic condition.  Problems of the lower back that are caused by compensating for the injury, such as limping.  Pain or weakness of the foot during push-off following surgery.  Chronic inflammation, scarring, and partial or complete fascia tear, occurring more often from repeated injections.  TREATMENT  Treatment initially involves the  use of ice and medication to help reduce pain and inflammation. The use of strengthening and stretching exercises may help reduce pain with activity, especially stretches of the Achilles tendon. These exercises may be performed at home or with a therapist. Your caregiver may recommend that you use heel cups of arch supports to help reduce stress on the plantar fascia. Occasionally, corticosteroid injections are given to reduce inflammation. If symptoms persist for greater than 6 months despite non-surgical (conservative), then surgery may be recommended.   MEDICATION   If pain medication is necessary, then nonsteroidal anti-inflammatory medications, such as aspirin and ibuprofen, or other minor pain relievers, such as acetaminophen, are often recommended.  Do not take pain medication within 7 days before surgery.  Prescription pain relievers may be given if deemed necessary by your caregiver. Use only as directed and only as much as you need.  Corticosteroid injections may be given by your caregiver. These injections should be reserved for the most serious cases, because they may only be given a certain number of times.  HEAT AND COLD  Cold treatment (icing) relieves pain and reduces inflammation. Cold treatment should be applied for 10 to 15 minutes every 2 to 3 hours for inflammation and pain and immediately after any activity that aggravates your symptoms. Use ice packs or massage the area with a piece of ice (ice massage).  Heat treatment may be used prior to performing the stretching and strengthening activities prescribed by your caregiver, physical therapist, or athletic trainer. Use a heat pack or soak the injury in warm water.  SEEK IMMEDIATE MEDICAL CARE IF:  Treatment seems to offer no benefit, or the condition worsens.  Any medications   produce adverse side effects.  EXERCISES- RANGE OF MOTION (ROM) AND STRETCHING EXERCISES - Plantar Fasciitis (Heel Spur Syndrome) These exercises  may help you when beginning to rehabilitate your injury. Your symptoms may resolve with or without further involvement from your physician, physical therapist or athletic trainer. While completing these exercises, remember:   Restoring tissue flexibility helps normal motion to return to the joints. This allows healthier, less painful movement and activity.  An effective stretch should be held for at least 30 seconds.  A stretch should never be painful. You should only feel a gentle lengthening or release in the stretched tissue.  RANGE OF MOTION - Toe Extension, Flexion  Sit with your right / left leg crossed over your opposite knee.  Grasp your toes and gently pull them back toward the top of your foot. You should feel a stretch on the bottom of your toes and/or foot.  Hold this stretch for 10 seconds.  Now, gently pull your toes toward the bottom of your foot. You should feel a stretch on the top of your toes and or foot.  Hold this stretch for 10 seconds. Repeat  times. Complete this stretch 3 times per day.   RANGE OF MOTION - Ankle Dorsiflexion, Active Assisted  Remove shoes and sit on a chair that is preferably not on a carpeted surface.  Place right / left foot under knee. Extend your opposite leg for support.  Keeping your heel down, slide your right / left foot back toward the chair until you feel a stretch at your ankle or calf. If you do not feel a stretch, slide your bottom forward to the edge of the chair, while still keeping your heel down.  Hold this stretch for 10 seconds. Repeat 3 times. Complete this stretch 2 times per day.   STRETCH  Gastroc, Standing  Place hands on wall.  Extend right / left leg, keeping the front knee somewhat bent.  Slightly point your toes inward on your back foot.  Keeping your right / left heel on the floor and your knee straight, shift your weight toward the wall, not allowing your back to arch.  You should feel a gentle stretch  in the right / left calf. Hold this position for 10 seconds. Repeat 3 times. Complete this stretch 2 times per day.  STRETCH  Soleus, Standing  Place hands on wall.  Extend right / left leg, keeping the other knee somewhat bent.  Slightly point your toes inward on your back foot.  Keep your right / left heel on the floor, bend your back knee, and slightly shift your weight over the back leg so that you feel a gentle stretch deep in your back calf.  Hold this position for 10 seconds. Repeat 3 times. Complete this stretch 2 times per day.  STRETCH  Gastrocsoleus, Standing  Note: This exercise can place a lot of stress on your foot and ankle. Please complete this exercise only if specifically instructed by your caregiver.   Place the ball of your right / left foot on a step, keeping your other foot firmly on the same step.  Hold on to the wall or a rail for balance.  Slowly lift your other foot, allowing your body weight to press your heel down over the edge of the step.  You should feel a stretch in your right / left calf.  Hold this position for 10 seconds.  Repeat this exercise with a slight bend in your right /   left knee. Repeat 3 times. Complete this stretch 2 times per day.   STRENGTHENING EXERCISES - Plantar Fasciitis (Heel Spur Syndrome)  These exercises may help you when beginning to rehabilitate your injury. They may resolve your symptoms with or without further involvement from your physician, physical therapist or athletic trainer. While completing these exercises, remember:   Muscles can gain both the endurance and the strength needed for everyday activities through controlled exercises.  Complete these exercises as instructed by your physician, physical therapist or athletic trainer. Progress the resistance and repetitions only as guided.  STRENGTH - Towel Curls  Sit in a chair positioned on a non-carpeted surface.  Place your foot on a towel, keeping your heel  on the floor.  Pull the towel toward your heel by only curling your toes. Keep your heel on the floor. Repeat 3 times. Complete this exercise 2 times per day.  STRENGTH - Ankle Inversion  Secure one end of a rubber exercise band/tubing to a fixed object (table, pole). Loop the other end around your foot just before your toes.  Place your fists between your knees. This will focus your strengthening at your ankle.  Slowly, pull your big toe up and in, making sure the band/tubing is positioned to resist the entire motion.  Hold this position for 10 seconds.  Have your muscles resist the band/tubing as it slowly pulls your foot back to the starting position. Repeat 3 times. Complete this exercises 2 times per day.  Document Released: 06/01/2005 Document Revised: 08/24/2011 Document Reviewed: 09/13/2008 [facility] Patient Information 2014 [place], [place].   WEARING INSTRUCTIONS FOR ORTHOTICS  Don't expect to be comfortable wearing your orthotic devices for the first time.  Like eyeglasses, you may be aware of them as time passes, they will not be uncomfortable and you will enjoy wearing them.  FOLLOW THESE INSTRUCTIONS EXACTLY!  1. Wear your orthotic devices for:       Not more than 1 hour the first day.       Not more than 2 hours the second day.       Not more than 3 hours the third day and so on.        Or wear them for as long as they feel comfortable.       If you experience discomfort in your feet or legs take them out.  When feet & legs feel       better, put them back in.  You do need to be consistent and wear them a little        everyday. 2.   If at any time the orthotic devices become acutely uncomfortable before the       time for that particular day, STOP WEARING THEM. 3.   On the next day, do not increase the wearing time. 4.   Subsequently, increase the wearing time by 15-30 minutes only if comfortable to do       so. 5.   You will be seen by your doctor about 2-4  weeks after you receive your orthotic       devices, at which time you will probably be wearing your devices comfortably        for about 8 hours or more a day. 6.   Some patients occasionally report mild aches or discomfort in other parts of the of       body such as the knees, hips or back after 3 or 4 consecutive hours of wear.  If this       is the case with you, do not extend your wearing time.  Instead, cut it back an hour or       two.  In all likelihood, these symptoms will disappear in a short period of time as your       body posture realigns itself and functions more efficiently. 7.   It is possible that your orthotic device may require some small changes or adjustment       to improve their function or make them more comfortable.   This is usually not done       before one to three months have elapsed.  These adjustments are made in        accordance with the changed position your feet are assuming as a result of       improved biomechanical function. 8.   In women's shoes, it's not unusual for your heel to slip out of the shoe, particularly if       they are step-in-shoes.  If this is the case, try other shoes or other styles.  Try to       purchase shoes which have deeper heal seats or higher heel counters. 9.   Squeaking of orthotics devices in the shoes is due to the movement of the devices       when they are functioning normally.  To eliminate squeaking, simply dust some       baby powder into your shoes before inserting the devices.  If this does not work,        apply soap or wax to the edges of the orthotic devices or put a tissue into the shoes. 10. It is important that you follow these directions explicitly.  Failure to do so will simply       prolong the adjustment period or create problems which are easily avoided.  It makes       no difference if you are wearing your orthotic devices for only a few hours after        several months, so long as you are wearing them  comfortably for those hours. 11. If you have any questions or complaints, contact our office.  We have no way of       knowing about your problems unless you tell us.  If we do not hear from you, we will       assume that you are proceeding well.

## 2018-10-13 NOTE — Progress Notes (Signed)
Subjective: 63 year old female presents the office today for follow-up evaluation of right ankle pain.  She says overall her ankle is doing much better and she has no pain however since wearing the brace she has developed some pain in the bottom of her heel she thinks that she has plantar fasciitis.  She also has had some chronic pain to the top of her foot she points to the midfoot area.  No recent injury or falls or any changes since we last saw her.  She also presents today for orthotics. Denies any systemic complaints such as fevers, chills, nausea, vomiting. No acute changes since last appointment, and no other complaints at this time.   Objective: AAO x3, NAD DP/PT pulses palpable bilaterally, CRT less than 3 seconds At this time there is no tenderness palpation of the ankle.  The area of tenderness is on the plantar aspect of the heel there is motion of the plantar fascia.  The fascia grossly intact.  No pain with lateral compression of calcaneus.  Achilles tendon appears to be intact.  There is no edema, erythema.  Minimal discomfort the dorsal aspect of the left foot and there is slight prominence palpable.  No area pinpoint bony tenderness pain vibratory sensation. No open lesions or pre-ulcerative lesions.  No pain with calf compression, swelling, warmth, erythema  Assessment: Right heel pain, letter fasciitis with resolved ankle pain, right midfoot arthritis  Plan: -All treatment options discussed with the patient including all alternatives, risks, complications.  -The new orthotics were dispensed and break-in instructions were discussed.  On the way out she states that the orthotics are fitting comfortably and she can tell a difference and it was helping.  Also prescribed Voltaren gel for her.  This been helpful for the heel about the midfoot.  She declined steroid injection today.  Discussed traction exercises. -Patient encouraged to call the office with any questions, concerns, change  in symptoms.   Return in about 4 weeks (around 11/10/2018).  Trula Slade DPM

## 2018-10-14 DIAGNOSIS — M5416 Radiculopathy, lumbar region: Secondary | ICD-10-CM | POA: Diagnosis not present

## 2018-10-18 DIAGNOSIS — M545 Low back pain: Secondary | ICD-10-CM | POA: Diagnosis not present

## 2018-10-18 DIAGNOSIS — M5416 Radiculopathy, lumbar region: Secondary | ICD-10-CM | POA: Diagnosis not present

## 2018-11-10 ENCOUNTER — Other Ambulatory Visit: Payer: Self-pay

## 2018-11-10 ENCOUNTER — Encounter: Payer: Self-pay | Admitting: Podiatry

## 2018-11-10 ENCOUNTER — Ambulatory Visit (INDEPENDENT_AMBULATORY_CARE_PROVIDER_SITE_OTHER): Payer: Medicare Other | Admitting: Podiatry

## 2018-11-10 VITALS — Temp 97.7°F

## 2018-11-10 DIAGNOSIS — M2141 Flat foot [pes planus] (acquired), right foot: Secondary | ICD-10-CM

## 2018-11-10 DIAGNOSIS — M722 Plantar fascial fibromatosis: Secondary | ICD-10-CM | POA: Diagnosis not present

## 2018-11-10 DIAGNOSIS — M2142 Flat foot [pes planus] (acquired), left foot: Secondary | ICD-10-CM | POA: Diagnosis not present

## 2018-11-10 DIAGNOSIS — M5136 Other intervertebral disc degeneration, lumbar region: Secondary | ICD-10-CM | POA: Diagnosis not present

## 2018-11-13 NOTE — Progress Notes (Signed)
Subjective: 63 year old female presents the office for evaluation of plantar fasciitis.  She still gets some discomfort.  She states that she has been wearing orthotics about 6 hours a day and she still getting used to them.  She does think it is starting to cause some discomfort to the outside aspect of her knee and hip.  Overall her foot pain is improving. Denies any systemic complaints such as fevers, chills, nausea, vomiting. No acute changes since last appointment, and no other complaints at this time.   Objective: AAO x3, NAD DP/PT pulses palpable bilaterally, CRT less than 3 seconds There is mild tenderness palpation along the plantar medial tubercle of the calcaneus at the insertion of plantar fascia.  There is no pain to the ankle.  Plantar fascial appears to be intact.  No pain with compression of calcaneus.  No edema no erythema.  No other areas of tenderness.  No open lesions or pre-ulcerative lesions.  No pain with calf compression, swelling, warmth, erythema  Assessment: Plantar fasciitis  Plan: -All treatment options discussed with the patient including all alternatives, risks, complications.  -She wants to hold off on steroid injection today.  I did add a lateral post on the orthotic and when she was leaving she stated to feel better.  Continue with stretching, icing daily.  Continue with supportive shoes. -Patient encouraged to call the office with any questions, concerns, change in symptoms.   Return in about 4 weeks (around 12/08/2018).  Trula Slade DPM

## 2018-11-16 DIAGNOSIS — G43009 Migraine without aura, not intractable, without status migrainosus: Secondary | ICD-10-CM | POA: Diagnosis not present

## 2018-11-16 DIAGNOSIS — M19079 Primary osteoarthritis, unspecified ankle and foot: Secondary | ICD-10-CM | POA: Diagnosis not present

## 2018-11-16 DIAGNOSIS — R1033 Periumbilical pain: Secondary | ICD-10-CM | POA: Diagnosis not present

## 2018-11-16 DIAGNOSIS — F3341 Major depressive disorder, recurrent, in partial remission: Secondary | ICD-10-CM | POA: Diagnosis not present

## 2018-11-16 DIAGNOSIS — Z791 Long term (current) use of non-steroidal anti-inflammatories (NSAID): Secondary | ICD-10-CM | POA: Diagnosis not present

## 2018-11-16 DIAGNOSIS — K136 Irritative hyperplasia of oral mucosa: Secondary | ICD-10-CM | POA: Diagnosis not present

## 2018-11-16 DIAGNOSIS — M48061 Spinal stenosis, lumbar region without neurogenic claudication: Secondary | ICD-10-CM | POA: Diagnosis not present

## 2018-11-25 DIAGNOSIS — M5416 Radiculopathy, lumbar region: Secondary | ICD-10-CM | POA: Diagnosis not present

## 2018-11-25 DIAGNOSIS — M961 Postlaminectomy syndrome, not elsewhere classified: Secondary | ICD-10-CM | POA: Diagnosis not present

## 2018-11-25 DIAGNOSIS — G894 Chronic pain syndrome: Secondary | ICD-10-CM | POA: Diagnosis not present

## 2018-11-27 DIAGNOSIS — W57XXXA Bitten or stung by nonvenomous insect and other nonvenomous arthropods, initial encounter: Secondary | ICD-10-CM | POA: Diagnosis not present

## 2018-11-27 DIAGNOSIS — S90562A Insect bite (nonvenomous), left ankle, initial encounter: Secondary | ICD-10-CM | POA: Diagnosis not present

## 2018-12-07 DIAGNOSIS — R1030 Lower abdominal pain, unspecified: Secondary | ICD-10-CM | POA: Diagnosis not present

## 2018-12-08 ENCOUNTER — Other Ambulatory Visit: Payer: Self-pay

## 2018-12-08 ENCOUNTER — Ambulatory Visit: Payer: Medicare Other | Admitting: Orthotics

## 2018-12-08 DIAGNOSIS — M2142 Flat foot [pes planus] (acquired), left foot: Secondary | ICD-10-CM

## 2018-12-08 DIAGNOSIS — M722 Plantar fascial fibromatosis: Secondary | ICD-10-CM

## 2018-12-08 DIAGNOSIS — M2141 Flat foot [pes planus] (acquired), right foot: Secondary | ICD-10-CM

## 2018-12-08 DIAGNOSIS — M205X9 Other deformities of toe(s) (acquired), unspecified foot: Secondary | ICD-10-CM

## 2018-12-08 NOTE — Progress Notes (Signed)
Added 4* RF valgus post and 2* FF to help support lateral foot.

## 2018-12-21 DIAGNOSIS — M961 Postlaminectomy syndrome, not elsewhere classified: Secondary | ICD-10-CM | POA: Diagnosis not present

## 2018-12-22 ENCOUNTER — Other Ambulatory Visit: Payer: Self-pay | Admitting: Gastroenterology

## 2018-12-22 DIAGNOSIS — M48061 Spinal stenosis, lumbar region without neurogenic claudication: Secondary | ICD-10-CM | POA: Diagnosis not present

## 2018-12-22 DIAGNOSIS — M8580 Other specified disorders of bone density and structure, unspecified site: Secondary | ICD-10-CM | POA: Diagnosis not present

## 2018-12-22 DIAGNOSIS — Z13220 Encounter for screening for lipoid disorders: Secondary | ICD-10-CM | POA: Diagnosis not present

## 2018-12-22 DIAGNOSIS — F3341 Major depressive disorder, recurrent, in partial remission: Secondary | ICD-10-CM | POA: Diagnosis not present

## 2018-12-22 DIAGNOSIS — R1030 Lower abdominal pain, unspecified: Secondary | ICD-10-CM

## 2018-12-22 DIAGNOSIS — Z79899 Other long term (current) drug therapy: Secondary | ICD-10-CM | POA: Diagnosis not present

## 2018-12-22 DIAGNOSIS — G43009 Migraine without aura, not intractable, without status migrainosus: Secondary | ICD-10-CM | POA: Diagnosis not present

## 2018-12-30 ENCOUNTER — Ambulatory Visit
Admission: RE | Admit: 2018-12-30 | Discharge: 2018-12-30 | Disposition: A | Discharge status: Home / Self Care | Payer: Medicare Other | Source: Ambulatory Visit | Attending: Gastroenterology | Admitting: Gastroenterology

## 2018-12-30 ENCOUNTER — Other Ambulatory Visit: Payer: Self-pay

## 2018-12-30 DIAGNOSIS — R1030 Lower abdominal pain, unspecified: Secondary | ICD-10-CM

## 2018-12-30 IMAGING — CT CT ABDOMEN AND PELVIS WITH CONTRAST
2 of 5 series · 12 of 46 positions shown, 14 images · IV contrast (iopamidol)
Comparison: Operative radiographs [DATE]

CLINICAL DATA: Lower abdominopelvic pain and weight gain. Prior
hysterectomy. Prior lumbar surgery.

EXAM:
CT ABDOMEN AND PELVIS WITH CONTRAST
TECHNIQUE: Multidetector CT imaging of the abdomen and pelvis was performed
using the standard protocol following bolus administration of
intravenous contrast.
CONTRAST:  100mL [BJ] IOPAMIDOL ([BJ]) INJECTION 61%

[Series 2: abd pelvis 5.00 br40 s3 axial · axial · 0.55mm/px · z∈[+1105,+1475]mm · 9 of 89 slices shown, 11 images]
[im 10/89  soft-tissue]
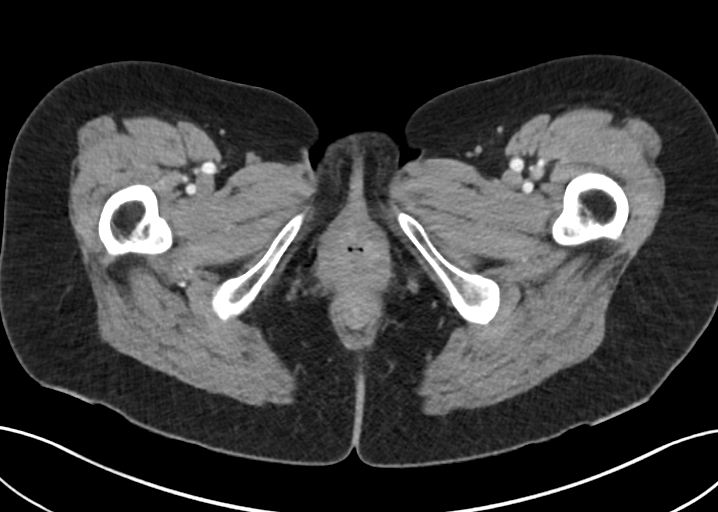
[im 10/89  bone]
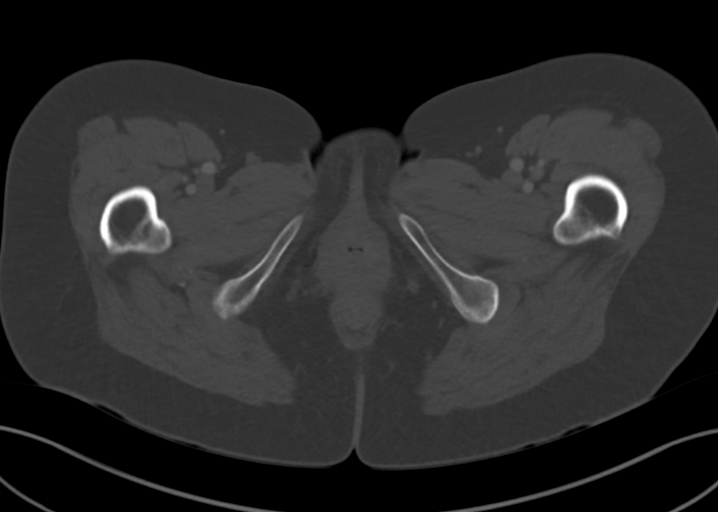
[im 19/89  soft-tissue]
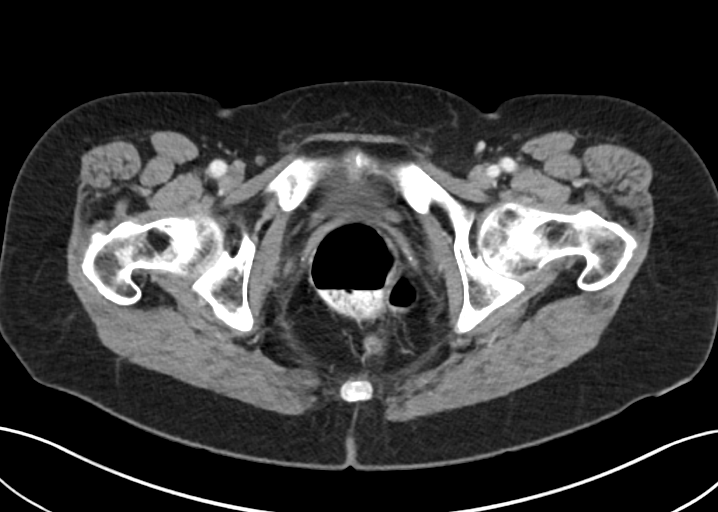
[im 28/89  soft-tissue]
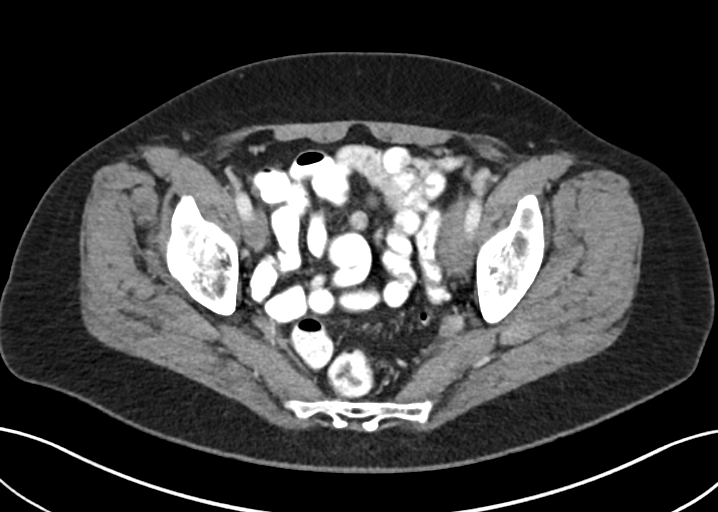
[im 38/89  soft-tissue]
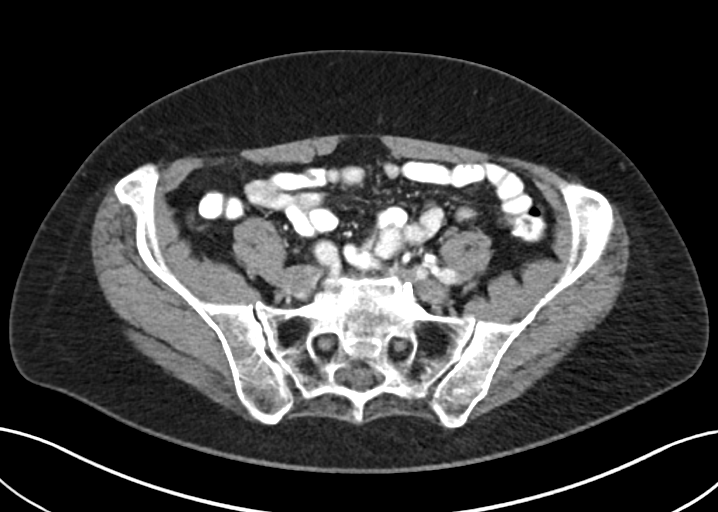
[im 47/89  soft-tissue]
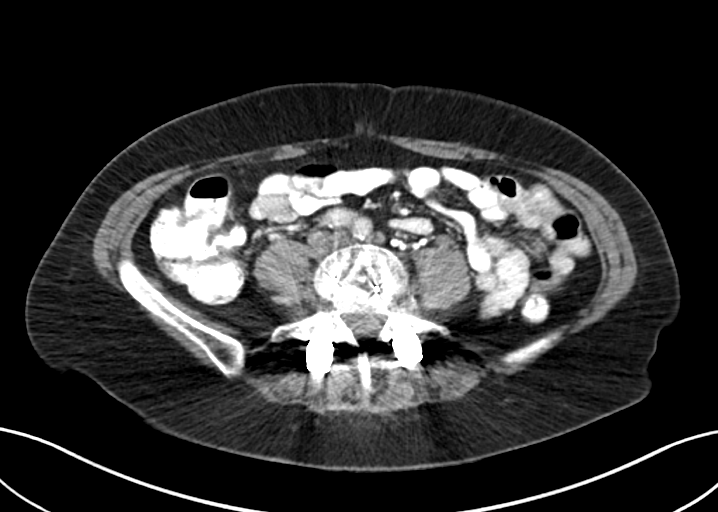
[im 56/89  soft-tissue]
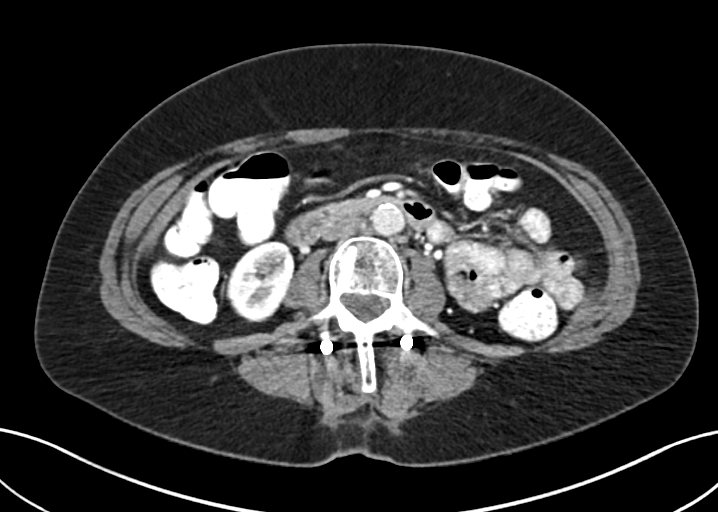
[im 65/89  soft-tissue]
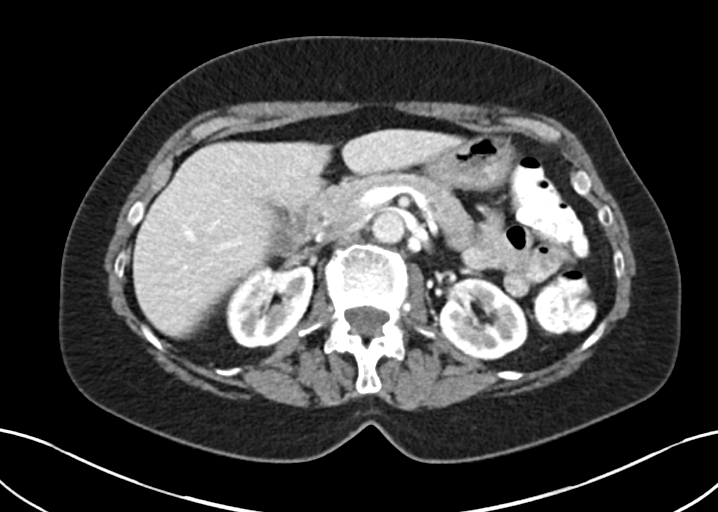
[im 75/89  soft-tissue]
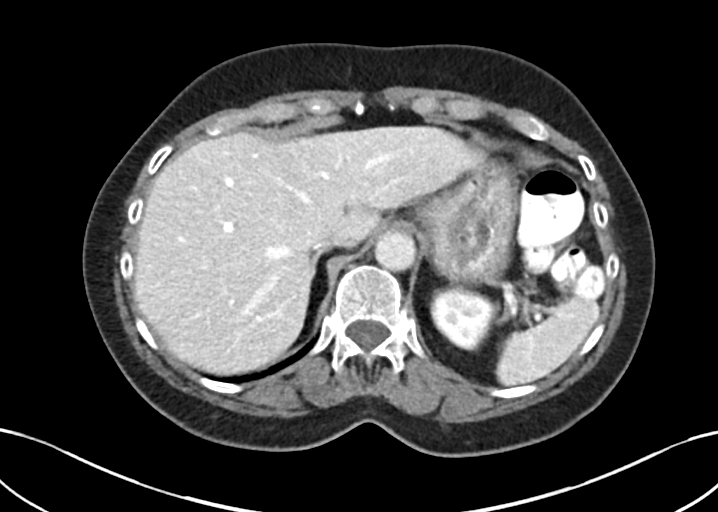
[im 84/89  soft-tissue]
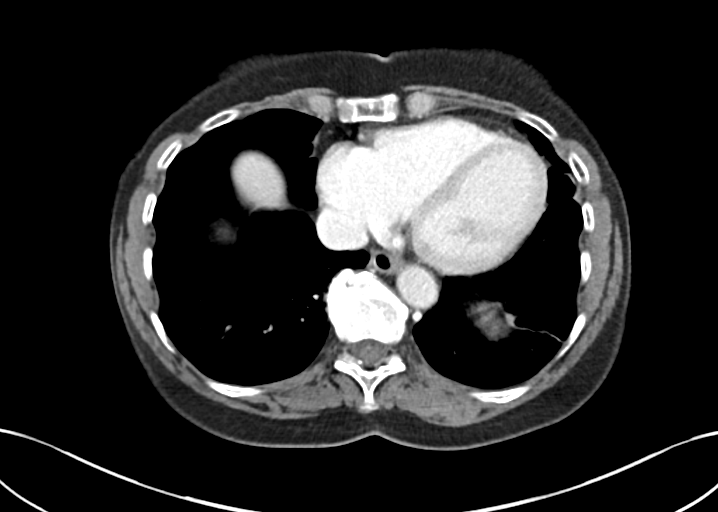
[im 84/89  bone]
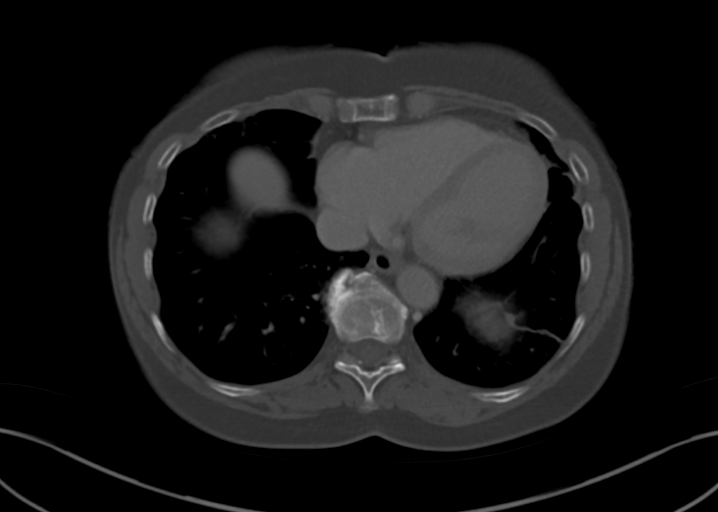

[Series 6: abd pelvis 2.00 br40 s3 cor · coronal · 0.78mm/px · 3 of 129 slices shown]
[im 43/129  soft-tissue]
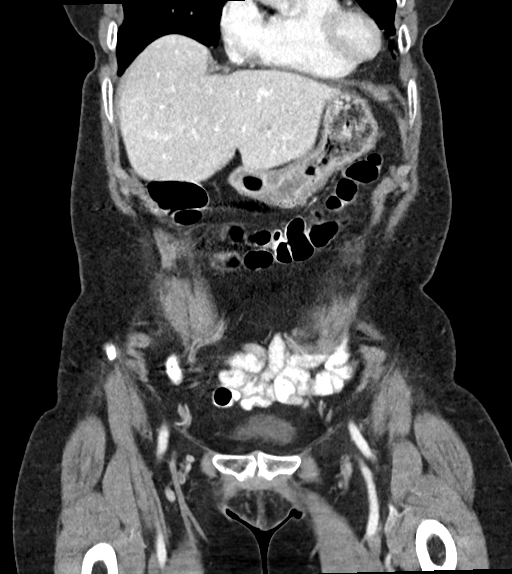
[im 57/129  soft-tissue]
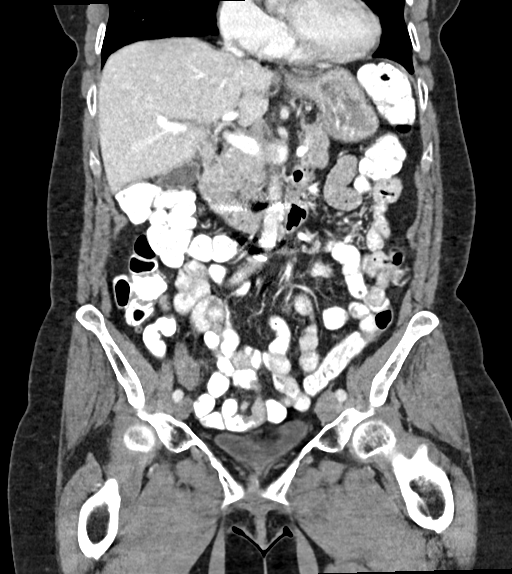
[im 72/129  soft-tissue]
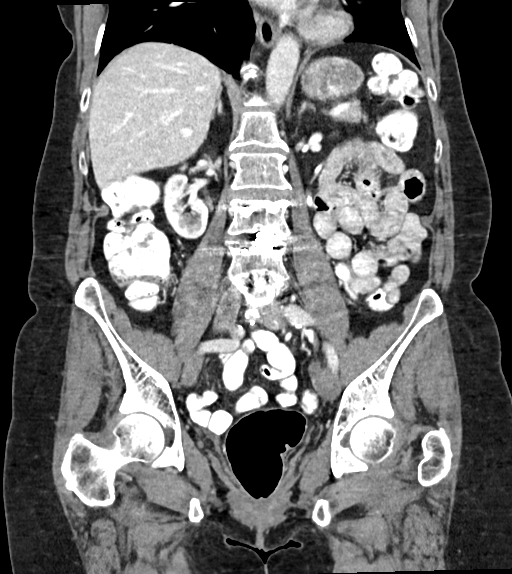

[12 of 46 positions shown; findings below may reference images not displayed]

FINDINGS: Lower chest: Bandlike areas of scarring and/or atelectasis in the
right middle lobe, lingula, and both lower lobes. No consolidation.
No effusion. Normal heart size. No pericardial effusion.

Hepatobiliary: No focal liver abnormality is seen. No gallstones,
gallbladder wall thickening, or biliary dilatation.

Pancreas: Unremarkable. No pancreatic ductal dilatation or
surrounding inflammatory changes.

Spleen: Normal in size without focal abnormality.

Adrenals/Urinary Tract: Adrenal glands are unremarkable.
Subcentimeter hypoattenuating focus in the lower pole left kidney
(axial image 29), too small to fully characterize on CT imaging but
statistically likely benign. Kidneys are otherwise normal, without
renal calculi, focal lesion, or hydronephrosis. Bladder is
unremarkable.

Stomach/Bowel: Distal esophagus, stomach and duodenal sweep are
unremarkable. No bowel wall thickening or dilatation. No evidence of
obstruction. A normal appendix is visualized. Scattered colonic
diverticula without focal pericolonic inflammation to suggest
diverticulitis.

Vascular/Lymphatic: Atherosclerotic plaque within the normal caliber
aorta. No suspicious or enlarged lymph nodes in the included
lymphatic chains.

Reproductive: Uterus is surgically absent. No concerning adnexal
lesions.

Other: No abdominopelvic free fluid or free gas. No bowel containing
hernias.

Musculoskeletal: Multilevel degenerative changes are present in the
imaged portions of the spine. Postsurgical changes from prior L3-S1
posterior spinal fusion with bilateral transpedicular screws at each
level and interbody spacer placements at L3-4 and L4-5. No evidence
of hardware failure or complication.
IMPRESSION: No acute intra-abdominal process.

Aortic Atherosclerosis ([BJ]-[BJ]).

Diverticulosis without diverticulitis.

## 2018-12-30 MED ORDER — IOPAMIDOL (ISOVUE-300) INJECTION 61%
100.0000 mL | Freq: Once | INTRAVENOUS | Status: AC | PRN
  Administered 2018-12-30: 100 mL via INTRAVENOUS

## 2019-01-13 DIAGNOSIS — Z13220 Encounter for screening for lipoid disorders: Secondary | ICD-10-CM | POA: Diagnosis not present

## 2019-01-13 DIAGNOSIS — G43009 Migraine without aura, not intractable, without status migrainosus: Secondary | ICD-10-CM | POA: Diagnosis not present

## 2019-01-13 DIAGNOSIS — R1030 Lower abdominal pain, unspecified: Secondary | ICD-10-CM | POA: Diagnosis not present

## 2019-01-13 DIAGNOSIS — F3341 Major depressive disorder, recurrent, in partial remission: Secondary | ICD-10-CM | POA: Diagnosis not present

## 2019-01-13 DIAGNOSIS — M48061 Spinal stenosis, lumbar region without neurogenic claudication: Secondary | ICD-10-CM | POA: Diagnosis not present

## 2019-01-13 DIAGNOSIS — Z79899 Other long term (current) drug therapy: Secondary | ICD-10-CM | POA: Diagnosis not present

## 2019-01-15 DIAGNOSIS — J0101 Acute recurrent maxillary sinusitis: Secondary | ICD-10-CM | POA: Diagnosis not present

## 2019-01-19 DIAGNOSIS — J3089 Other allergic rhinitis: Secondary | ICD-10-CM | POA: Diagnosis not present

## 2019-01-19 DIAGNOSIS — J329 Chronic sinusitis, unspecified: Secondary | ICD-10-CM | POA: Diagnosis not present

## 2019-01-19 DIAGNOSIS — H1045 Other chronic allergic conjunctivitis: Secondary | ICD-10-CM | POA: Diagnosis not present

## 2019-01-19 DIAGNOSIS — J301 Allergic rhinitis due to pollen: Secondary | ICD-10-CM | POA: Diagnosis not present

## 2019-01-19 DIAGNOSIS — J3081 Allergic rhinitis due to animal (cat) (dog) hair and dander: Secondary | ICD-10-CM | POA: Diagnosis not present

## 2019-01-25 DIAGNOSIS — Z6826 Body mass index (BMI) 26.0-26.9, adult: Secondary | ICD-10-CM | POA: Diagnosis not present

## 2019-01-25 DIAGNOSIS — R030 Elevated blood-pressure reading, without diagnosis of hypertension: Secondary | ICD-10-CM | POA: Diagnosis not present

## 2019-01-25 DIAGNOSIS — M961 Postlaminectomy syndrome, not elsewhere classified: Secondary | ICD-10-CM | POA: Diagnosis not present

## 2019-01-27 DIAGNOSIS — M5412 Radiculopathy, cervical region: Secondary | ICD-10-CM | POA: Diagnosis not present

## 2019-02-02 DIAGNOSIS — M5127 Other intervertebral disc displacement, lumbosacral region: Secondary | ICD-10-CM | POA: Diagnosis not present

## 2019-02-02 DIAGNOSIS — M79604 Pain in right leg: Secondary | ICD-10-CM | POA: Diagnosis not present

## 2019-02-02 DIAGNOSIS — M5416 Radiculopathy, lumbar region: Secondary | ICD-10-CM | POA: Diagnosis not present

## 2019-02-02 DIAGNOSIS — M4316 Spondylolisthesis, lumbar region: Secondary | ICD-10-CM | POA: Diagnosis not present

## 2019-02-02 DIAGNOSIS — M5136 Other intervertebral disc degeneration, lumbar region: Secondary | ICD-10-CM | POA: Diagnosis not present

## 2019-02-02 DIAGNOSIS — M48061 Spinal stenosis, lumbar region without neurogenic claudication: Secondary | ICD-10-CM | POA: Diagnosis not present

## 2019-02-02 DIAGNOSIS — Z981 Arthrodesis status: Secondary | ICD-10-CM | POA: Diagnosis not present

## 2019-02-02 DIAGNOSIS — M5126 Other intervertebral disc displacement, lumbar region: Secondary | ICD-10-CM | POA: Diagnosis not present

## 2019-03-14 DIAGNOSIS — G894 Chronic pain syndrome: Secondary | ICD-10-CM | POA: Diagnosis not present

## 2019-03-14 DIAGNOSIS — M5416 Radiculopathy, lumbar region: Secondary | ICD-10-CM | POA: Diagnosis not present

## 2019-03-14 DIAGNOSIS — G8929 Other chronic pain: Secondary | ICD-10-CM | POA: Diagnosis not present

## 2019-03-23 DIAGNOSIS — R200 Anesthesia of skin: Secondary | ICD-10-CM | POA: Diagnosis not present

## 2019-03-23 DIAGNOSIS — Z981 Arthrodesis status: Secondary | ICD-10-CM | POA: Diagnosis not present

## 2019-03-23 DIAGNOSIS — M48062 Spinal stenosis, lumbar region with neurogenic claudication: Secondary | ICD-10-CM | POA: Diagnosis not present

## 2019-03-23 DIAGNOSIS — M5416 Radiculopathy, lumbar region: Secondary | ICD-10-CM | POA: Diagnosis not present

## 2019-03-23 DIAGNOSIS — M48061 Spinal stenosis, lumbar region without neurogenic claudication: Secondary | ICD-10-CM | POA: Diagnosis not present

## 2019-03-23 DIAGNOSIS — M79605 Pain in left leg: Secondary | ICD-10-CM | POA: Diagnosis not present

## 2019-03-23 DIAGNOSIS — M79604 Pain in right leg: Secondary | ICD-10-CM | POA: Diagnosis not present

## 2019-03-30 DIAGNOSIS — Z981 Arthrodesis status: Secondary | ICD-10-CM | POA: Diagnosis not present

## 2019-03-30 DIAGNOSIS — M5416 Radiculopathy, lumbar region: Secondary | ICD-10-CM | POA: Diagnosis not present

## 2019-04-03 DIAGNOSIS — M5416 Radiculopathy, lumbar region: Secondary | ICD-10-CM | POA: Diagnosis not present

## 2019-04-03 DIAGNOSIS — R030 Elevated blood-pressure reading, without diagnosis of hypertension: Secondary | ICD-10-CM | POA: Diagnosis not present

## 2019-04-24 DIAGNOSIS — J3081 Allergic rhinitis due to animal (cat) (dog) hair and dander: Secondary | ICD-10-CM | POA: Diagnosis not present

## 2019-04-24 DIAGNOSIS — H1045 Other chronic allergic conjunctivitis: Secondary | ICD-10-CM | POA: Diagnosis not present

## 2019-04-24 DIAGNOSIS — M545 Low back pain: Secondary | ICD-10-CM | POA: Diagnosis not present

## 2019-04-24 DIAGNOSIS — J301 Allergic rhinitis due to pollen: Secondary | ICD-10-CM | POA: Diagnosis not present

## 2019-04-24 DIAGNOSIS — J3089 Other allergic rhinitis: Secondary | ICD-10-CM | POA: Diagnosis not present

## 2019-04-24 DIAGNOSIS — J329 Chronic sinusitis, unspecified: Secondary | ICD-10-CM | POA: Diagnosis not present

## 2019-04-28 DIAGNOSIS — M5416 Radiculopathy, lumbar region: Secondary | ICD-10-CM | POA: Diagnosis not present

## 2019-04-28 DIAGNOSIS — M5412 Radiculopathy, cervical region: Secondary | ICD-10-CM | POA: Diagnosis not present

## 2019-05-01 ENCOUNTER — Other Ambulatory Visit: Payer: Self-pay | Admitting: Orthopedic Surgery

## 2019-05-01 DIAGNOSIS — M545 Low back pain, unspecified: Secondary | ICD-10-CM

## 2019-05-05 DIAGNOSIS — M545 Low back pain: Secondary | ICD-10-CM | POA: Diagnosis not present

## 2019-05-05 DIAGNOSIS — M961 Postlaminectomy syndrome, not elsewhere classified: Secondary | ICD-10-CM | POA: Diagnosis not present

## 2019-05-05 DIAGNOSIS — M9904 Segmental and somatic dysfunction of sacral region: Secondary | ICD-10-CM | POA: Diagnosis not present

## 2019-05-05 DIAGNOSIS — M5416 Radiculopathy, lumbar region: Secondary | ICD-10-CM | POA: Diagnosis not present

## 2019-05-09 ENCOUNTER — Other Ambulatory Visit: Payer: Self-pay | Admitting: Orthopedic Surgery

## 2019-05-10 ENCOUNTER — Other Ambulatory Visit: Payer: Federal, State, Local not specified - PPO

## 2019-05-10 ENCOUNTER — Other Ambulatory Visit: Payer: Self-pay | Admitting: Orthopedic Surgery

## 2019-05-14 DIAGNOSIS — Z20828 Contact with and (suspected) exposure to other viral communicable diseases: Secondary | ICD-10-CM | POA: Diagnosis not present

## 2019-06-23 NOTE — Pre-Procedure Instructions (Signed)
   Sharon Logan  06/23/2019    [facility] [address] [address] [place], [place] [address] Phone: [phone] Fax: [phone]   Your procedure is scheduled on Thursday, June 29, 2019   Report to [place] at 5:30 A.M.  Call this number if you have problems the morning of surgery:  [phone]   Remember:  Do not eat  after midnight.   You may drink clear liquids until 4:15A.M the morning of surgery .  Clear liquids allowed are: Water, Juice (non-citric and without pulp), Carbonated beverages, Clear Tea, Black Coffee only and Gatorade Please finish your Ensure Pre-Surgery drink by 4:15 A.M. ( do not sip).    Take these medicines the morning of surgery with A SIP OF WATER :  escitalopram (LEXAPRO) If needed: eletriptan (RELPAX) for migraine or headache If needed: rizatriptan (MAXALT) for migraine If needed: HYDROcodone-acetaminophen ([place]) or pain If needed:NARCAN 4 MG/0.1ML LIQD nasal spray kit If needed:fluticasone (FLONASE)  nasal spray for allergies  If needed: Carboxymethylcellulose Sodium (THERATEARS OP) for dry eyes If needed:olopatadine (PATANOL) ophthalmic solution for allergies  Stop taking Aspirin (unless otherwise advised by surgeon), vitamins, fish oil, Sinus Support EF and herbal medications. Do not take any NSAIDs ie: celecoxib (CELEBREX), VOLTAREN)  Ibuprofen, Advil, Naproxen (Aleve/ Naprosyn ), Motrin, BC and Goody Powder; stop now.    Do not wear jewelry, make-up or nail polish.  Do not wear lotions, powders, or perfumes, or deodorant.  Do not shave 48 hours prior to surgery.   Do not bring valuables to the hospital.  [facility] is not responsible for any belongings or valuables.  Contacts, dentures or bridgework may not be worn into surgery.  Leave your suitcase in the car.  After surgery it may be brought to your room.  For patients admitted to the hospital, discharge  time will be determined by your treatment team. Special instructions: See " [facility] Preparing For Surgery " sheet.  Please read over the following fact sheets that you were given. Pain Booklet, Coughing and Deep Breathing and Surgical Site Infection Prevention

## 2019-06-26 ENCOUNTER — Other Ambulatory Visit: Payer: Self-pay

## 2019-06-26 ENCOUNTER — Other Ambulatory Visit (HOSPITAL_COMMUNITY)
Admission: RE | Admit: 2019-06-26 | Discharge: 2019-06-26 | Disposition: A | Discharge status: Home / Self Care | Payer: PPO | Source: Ambulatory Visit | Attending: Orthopedic Surgery | Admitting: Orthopedic Surgery

## 2019-06-26 ENCOUNTER — Encounter (HOSPITAL_COMMUNITY): Payer: Self-pay

## 2019-06-26 ENCOUNTER — Encounter (HOSPITAL_COMMUNITY)
Admission: RE | Admit: 2019-06-26 | Discharge: 2019-06-26 | Disposition: A | Discharge status: Home / Self Care | Payer: PPO | Source: Ambulatory Visit | Attending: Orthopedic Surgery | Admitting: Orthopedic Surgery

## 2019-06-26 DIAGNOSIS — Z01812 Encounter for preprocedural laboratory examination: Secondary | ICD-10-CM | POA: Diagnosis not present

## 2019-06-26 HISTORY — DX: Malignant (primary) neoplasm, unspecified: C80.1

## 2019-06-26 HISTORY — DX: Vitamin D deficiency, unspecified: E55.9

## 2019-06-26 HISTORY — DX: Unspecified osteoarthritis, unspecified site: M19.90

## 2019-06-26 HISTORY — DX: Presence of spectacles and contact lenses: Z97.3

## 2019-06-26 LAB — CBC WITH DIFFERENTIAL/PLATELET
Abs Immature Granulocytes: 0.01 10*3/uL (ref 0.00–0.07)
Basophils Absolute: 0 10*3/uL (ref 0.0–0.1)
Basophils Relative: 1 %
Eosinophils Absolute: 0.1 10*3/uL (ref 0.0–0.5)
Eosinophils Relative: 1 %
HCT: 42.5 % (ref 36.0–46.0)
Hemoglobin: 13.4 g/dL (ref 12.0–15.0)
Immature Granulocytes: 0 %
Lymphocytes Relative: 30 %
Lymphs Abs: 1.9 10*3/uL (ref 0.7–4.0)
MCH: 30.1 pg (ref 26.0–34.0)
MCHC: 31.5 g/dL (ref 30.0–36.0)
MCV: 95.5 fL (ref 80.0–100.0)
Monocytes Absolute: 0.4 10*3/uL (ref 0.1–1.0)
Monocytes Relative: 6 %
Neutro Abs: 3.9 10*3/uL (ref 1.7–7.7)
Neutrophils Relative %: 62 %
Platelets: 269 10*3/uL (ref 150–400)
RBC: 4.45 MIL/uL (ref 3.87–5.11)
RDW: 12.6 % (ref 11.5–15.5)
WBC: 6.3 10*3/uL (ref 4.0–10.5)
nRBC: 0 % (ref 0.0–0.2)

## 2019-06-26 LAB — COMPREHENSIVE METABOLIC PANEL
ALT: 15 U/L (ref 0–44)
AST: 22 U/L (ref 15–41)
Albumin: 4 g/dL (ref 3.5–5.0)
Alkaline Phosphatase: 75 U/L (ref 38–126)
Anion gap: 9 (ref 5–15)
BUN: 20 mg/dL (ref 8–23)
CO2: 24 mmol/L (ref 22–32)
Calcium: 9.6 mg/dL (ref 8.9–10.3)
Chloride: 106 mmol/L (ref 98–111)
Creatinine, Ser: 0.84 mg/dL (ref 0.44–1.00)
GFR calc Af Amer: >60 mL/min (ref >60)
GFR calc non Af Amer: >60 mL/min (ref >60)
Glucose, Bld: 98 mg/dL (ref 70–99)
Potassium: 4.7 mmol/L (ref 3.5–5.1)
Sodium: 139 mmol/L (ref 135–145)
Total Bilirubin: 0.5 mg/dL (ref 0.3–1.2)
Total Protein: 6.9 g/dL (ref 6.5–8.1)

## 2019-06-26 LAB — URINALYSIS, ROUTINE W REFLEX MICROSCOPIC
Bilirubin Urine: NEGATIVE
Glucose, UA: NEGATIVE mg/dL
Hgb urine dipstick: NEGATIVE
Ketones, ur: 5 mg/dL — AB
Nitrite: NEGATIVE
Protein, ur: NEGATIVE mg/dL
Specific Gravity, Urine: 1.029 (ref 1.005–1.030)
pH: 5 (ref 5.0–8.0)

## 2019-06-26 LAB — TYPE AND SCREEN
ABO/RH(D): A POS
Antibody Screen: NEGATIVE

## 2019-06-26 LAB — PROTIME-INR
INR: 1 (ref 0.8–1.2)
Prothrombin Time: 13.1 seconds (ref 11.4–15.2)

## 2019-06-26 LAB — SURGICAL PCR SCREEN
MRSA, PCR: NEGATIVE
Staphylococcus aureus: NEGATIVE

## 2019-06-26 LAB — APTT: aPTT: 27 seconds (ref 24–36)

## 2019-06-26 NOTE — Progress Notes (Signed)
Pt denies SOB, chest pain, and being under the care of a cardiologist. Pt stated that PCP is Dr. Marda Stalker. Pt denies having a stress test, echo and cardiac cath. Pt denies having a chest x ray and EKG. Pt denies recent labs. Pt made aware to quarantine after having COVID test. Pt verbalized understanding of all pre-op instructions.

## 2019-06-27 LAB — NOVEL CORONAVIRUS, NAA (HOSP ORDER, SEND-OUT TO REF LAB; TAT 18-24 HRS): SARS-CoV-2, NAA: NOT DETECTED

## 2019-06-29 ENCOUNTER — Encounter (HOSPITAL_COMMUNITY)
Admission: RE | Disposition: A | Discharge status: Home / Self Care | Payer: Self-pay | Source: Ambulatory Visit | Attending: Orthopedic Surgery

## 2019-06-29 ENCOUNTER — Inpatient Hospital Stay (HOSPITAL_COMMUNITY): Payer: PPO

## 2019-06-29 ENCOUNTER — Other Ambulatory Visit: Payer: Self-pay

## 2019-06-29 ENCOUNTER — Encounter (HOSPITAL_COMMUNITY): Payer: Self-pay | Admitting: Orthopedic Surgery

## 2019-06-29 ENCOUNTER — Inpatient Hospital Stay (HOSPITAL_COMMUNITY): Payer: PPO | Admitting: Anesthesiology

## 2019-06-29 ENCOUNTER — Inpatient Hospital Stay (HOSPITAL_COMMUNITY)
Admission: RE | Admit: 2019-06-29 | Discharge: 2019-06-30 | DRG: 460 | Disposition: A | Discharge status: Home / Self Care | Payer: PPO | Source: Ambulatory Visit | Attending: Orthopedic Surgery | Admitting: Orthopedic Surgery

## 2019-06-29 DIAGNOSIS — G8929 Other chronic pain: Secondary | ICD-10-CM | POA: Diagnosis present

## 2019-06-29 DIAGNOSIS — F419 Anxiety disorder, unspecified: Secondary | ICD-10-CM | POA: Diagnosis not present

## 2019-06-29 DIAGNOSIS — M545 Low back pain: Secondary | ICD-10-CM | POA: Diagnosis not present

## 2019-06-29 DIAGNOSIS — Z825 Family history of asthma and other chronic lower respiratory diseases: Secondary | ICD-10-CM

## 2019-06-29 DIAGNOSIS — H04123 Dry eye syndrome of bilateral lacrimal glands: Secondary | ICD-10-CM | POA: Diagnosis present

## 2019-06-29 DIAGNOSIS — M1990 Unspecified osteoarthritis, unspecified site: Secondary | ICD-10-CM | POA: Diagnosis not present

## 2019-06-29 DIAGNOSIS — Z79899 Other long term (current) drug therapy: Secondary | ICD-10-CM | POA: Diagnosis not present

## 2019-06-29 DIAGNOSIS — M5416 Radiculopathy, lumbar region: Secondary | ICD-10-CM | POA: Diagnosis present

## 2019-06-29 DIAGNOSIS — M5410 Radiculopathy, site unspecified: Secondary | ICD-10-CM

## 2019-06-29 DIAGNOSIS — Z888 Allergy status to other drugs, medicaments and biological substances status: Secondary | ICD-10-CM

## 2019-06-29 DIAGNOSIS — Z85828 Personal history of other malignant neoplasm of skin: Secondary | ICD-10-CM

## 2019-06-29 DIAGNOSIS — F329 Major depressive disorder, single episode, unspecified: Secondary | ICD-10-CM | POA: Diagnosis not present

## 2019-06-29 DIAGNOSIS — R519 Headache, unspecified: Secondary | ICD-10-CM | POA: Diagnosis not present

## 2019-06-29 DIAGNOSIS — Z818 Family history of other mental and behavioral disorders: Secondary | ICD-10-CM | POA: Diagnosis not present

## 2019-06-29 DIAGNOSIS — Z9109 Other allergy status, other than to drugs and biological substances: Secondary | ICD-10-CM | POA: Diagnosis not present

## 2019-06-29 DIAGNOSIS — M960 Pseudarthrosis after fusion or arthrodesis: Secondary | ICD-10-CM | POA: Diagnosis not present

## 2019-06-29 DIAGNOSIS — M5417 Radiculopathy, lumbosacral region: Secondary | ICD-10-CM | POA: Diagnosis present

## 2019-06-29 DIAGNOSIS — T84038A Mechanical loosening of other internal prosthetic joint, initial encounter: Secondary | ICD-10-CM | POA: Diagnosis not present

## 2019-06-29 DIAGNOSIS — M797 Fibromyalgia: Secondary | ICD-10-CM | POA: Diagnosis present

## 2019-06-29 DIAGNOSIS — Z8249 Family history of ischemic heart disease and other diseases of the circulatory system: Secondary | ICD-10-CM

## 2019-06-29 DIAGNOSIS — Z20822 Contact with and (suspected) exposure to covid-19: Secondary | ICD-10-CM | POA: Diagnosis not present

## 2019-06-29 DIAGNOSIS — E559 Vitamin D deficiency, unspecified: Secondary | ICD-10-CM | POA: Diagnosis present

## 2019-06-29 DIAGNOSIS — Z981 Arthrodesis status: Secondary | ICD-10-CM | POA: Diagnosis not present

## 2019-06-29 DIAGNOSIS — K219 Gastro-esophageal reflux disease without esophagitis: Secondary | ICD-10-CM | POA: Diagnosis not present

## 2019-06-29 DIAGNOSIS — Z419 Encounter for procedure for purposes other than remedying health state, unspecified: Secondary | ICD-10-CM

## 2019-06-29 HISTORY — DX: Radiculopathy, site unspecified: M54.10

## 2019-06-29 LAB — POCT I-STAT EG7
Acid-base deficit: 1 mmol/L (ref 0.0–2.0)
Bicarbonate: 23 mmol/L (ref 20.0–28.0)
Calcium, Ion: 1.12 mmol/L — ABNORMAL LOW (ref 1.15–1.40)
HCT: 28 % — ABNORMAL LOW (ref 36.0–46.0)
Hemoglobin: 9.5 g/dL — ABNORMAL LOW (ref 12.0–15.0)
O2 Saturation: 99 %
Patient temperature: 37.1
Potassium: 3.5 mmol/L (ref 3.5–5.1)
Sodium: 138 mmol/L (ref 135–145)
TCO2: 24 mmol/L (ref 22–32)
pCO2, Ven: 36.9 mmHg — ABNORMAL LOW (ref 44.0–60.0)
pH, Ven: 7.404 (ref 7.250–7.430)
pO2, Ven: 126 mmHg — ABNORMAL HIGH (ref 32.0–45.0)

## 2019-06-29 IMAGING — RF DG C-ARM 1-60 MIN
1 series · 3 of 3 positions shown · non-contrast
Comparison: [DATE]. [DATE].

CLINICAL DATA: L3-4 and L4-5 revision.

EXAM:
LUMBAR SPINE - 2-3 VIEW; DG C-ARM 1-60 MIN

[Series 1: run · 3 of 3 slices shown]
[im 1/3]
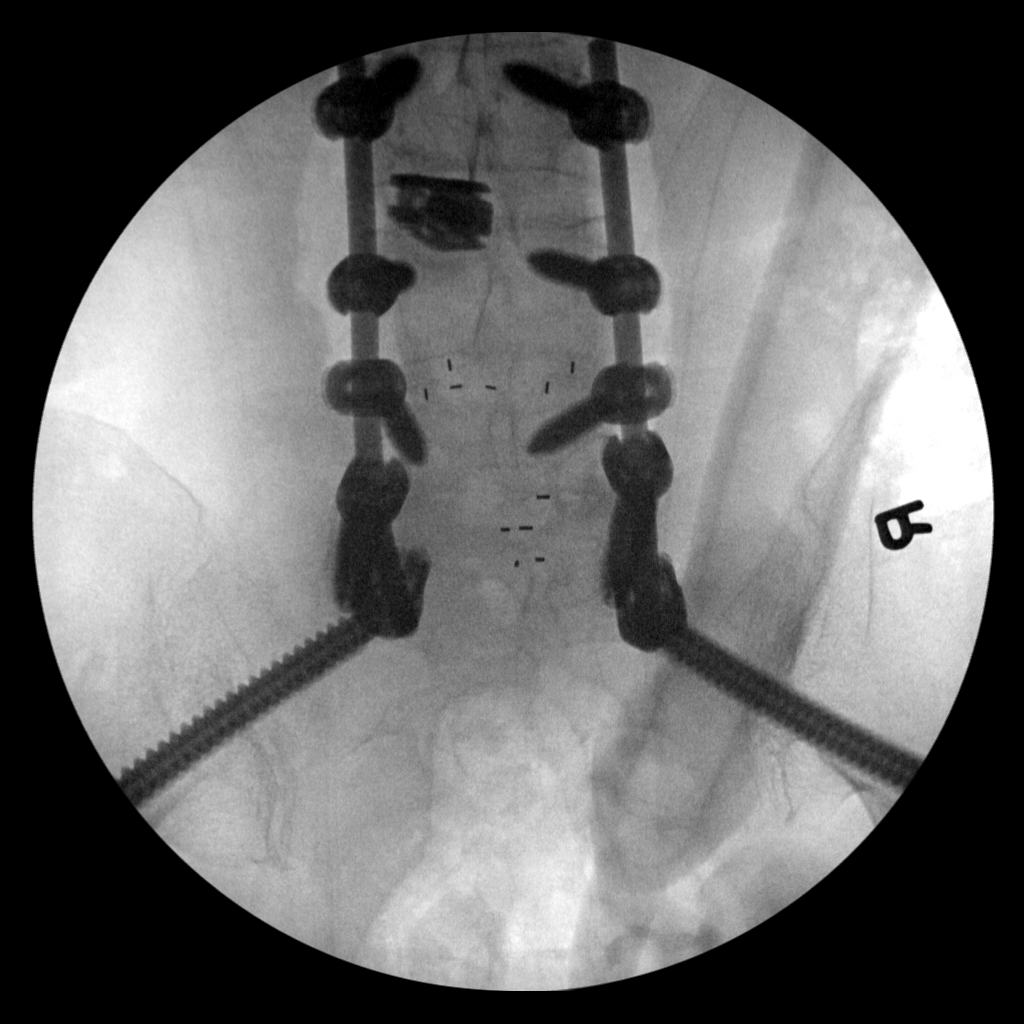
[im 2/3]
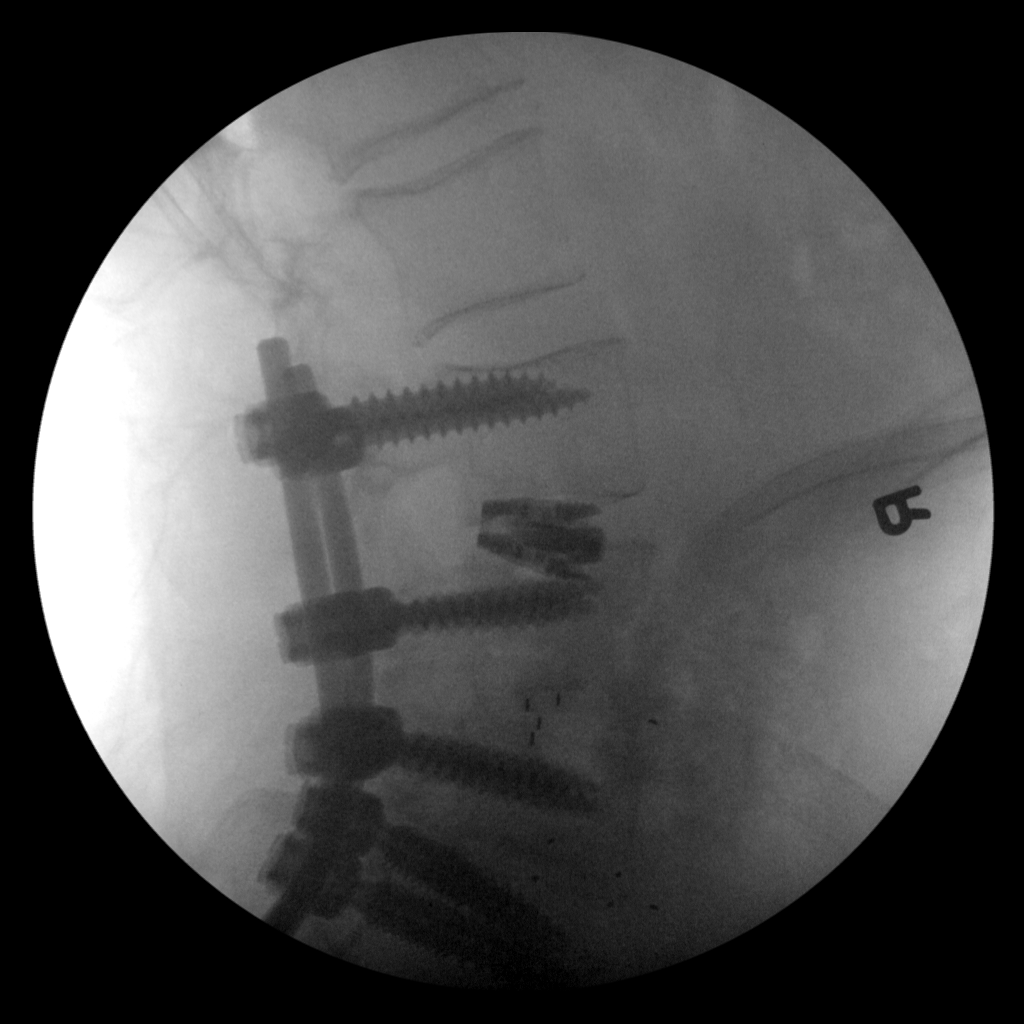
[im 3/3]
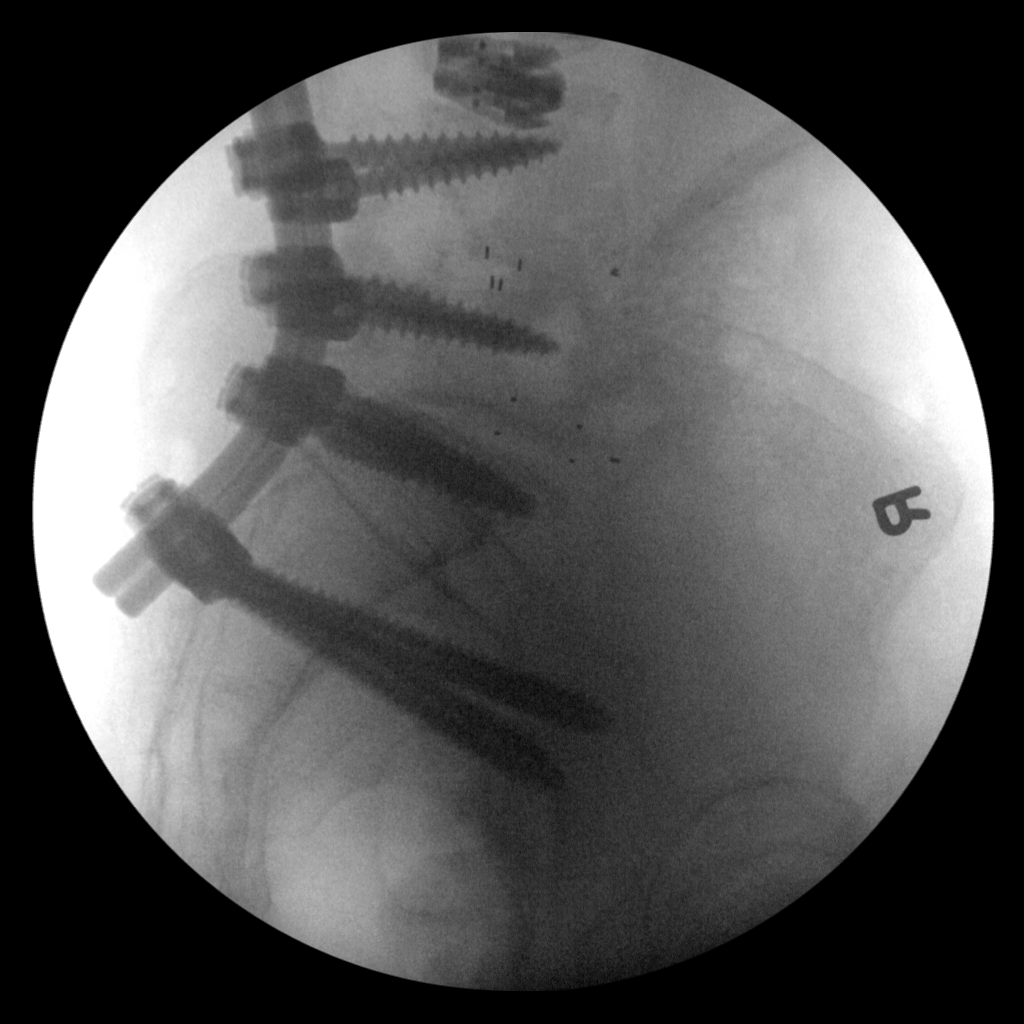

[3 of 3 positions shown; findings below may reference images not displayed]

FINDINGS: Interbody and pedicle screw and rod fusion at the L3 through S2
levels with long bilateral sacral screws bridging the sacroiliac
joints. Normal alignment on these views.
IMPRESSION: Hardware fusion at the L3 through S2 levels.

## 2019-06-29 IMAGING — RF DG LUMBAR SPINE 2-3V
1 series · 3 of 3 positions shown · non-contrast
Comparison: [DATE]. [DATE].

CLINICAL DATA: L3-4 and L4-5 revision.

EXAM:
LUMBAR SPINE - 2-3 VIEW; DG C-ARM 1-60 MIN

[Series 1: run · 3 of 3 slices shown]
[im 1/3]
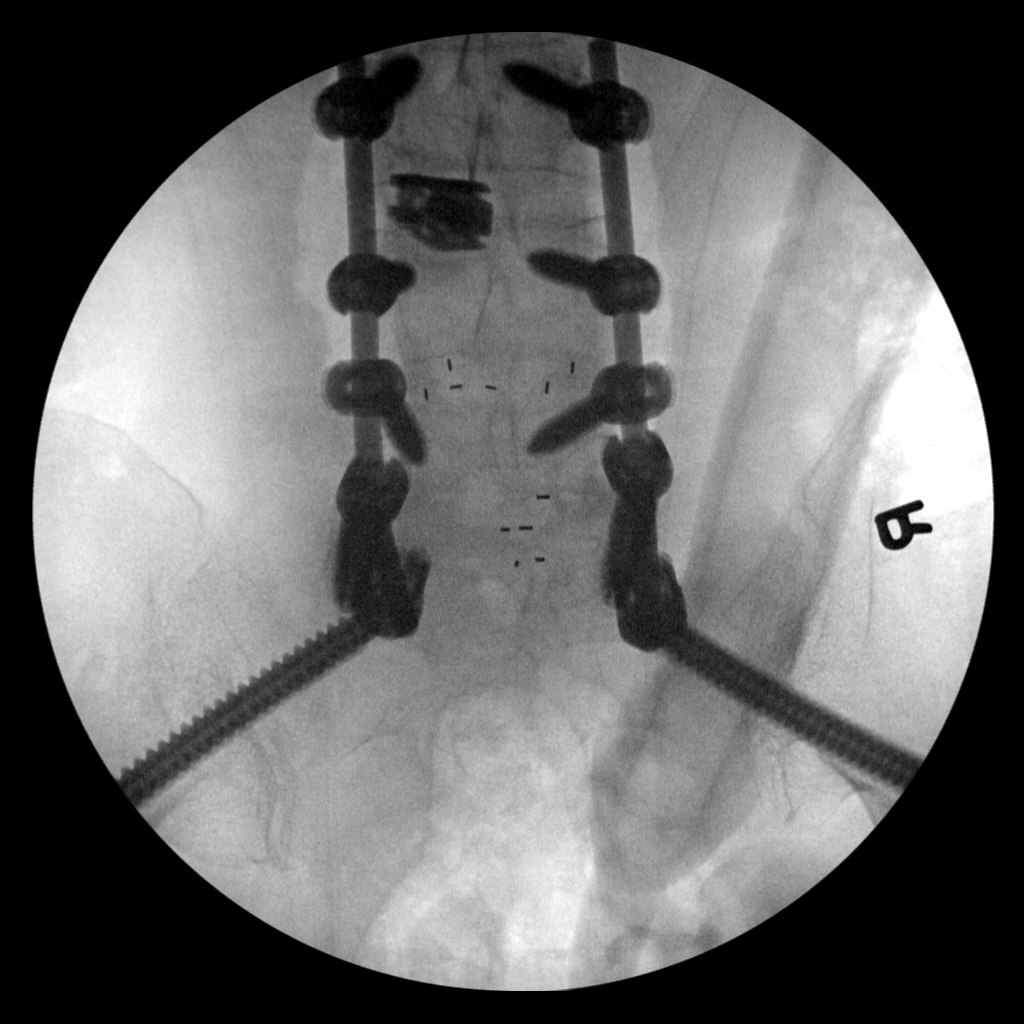
[im 2/3]
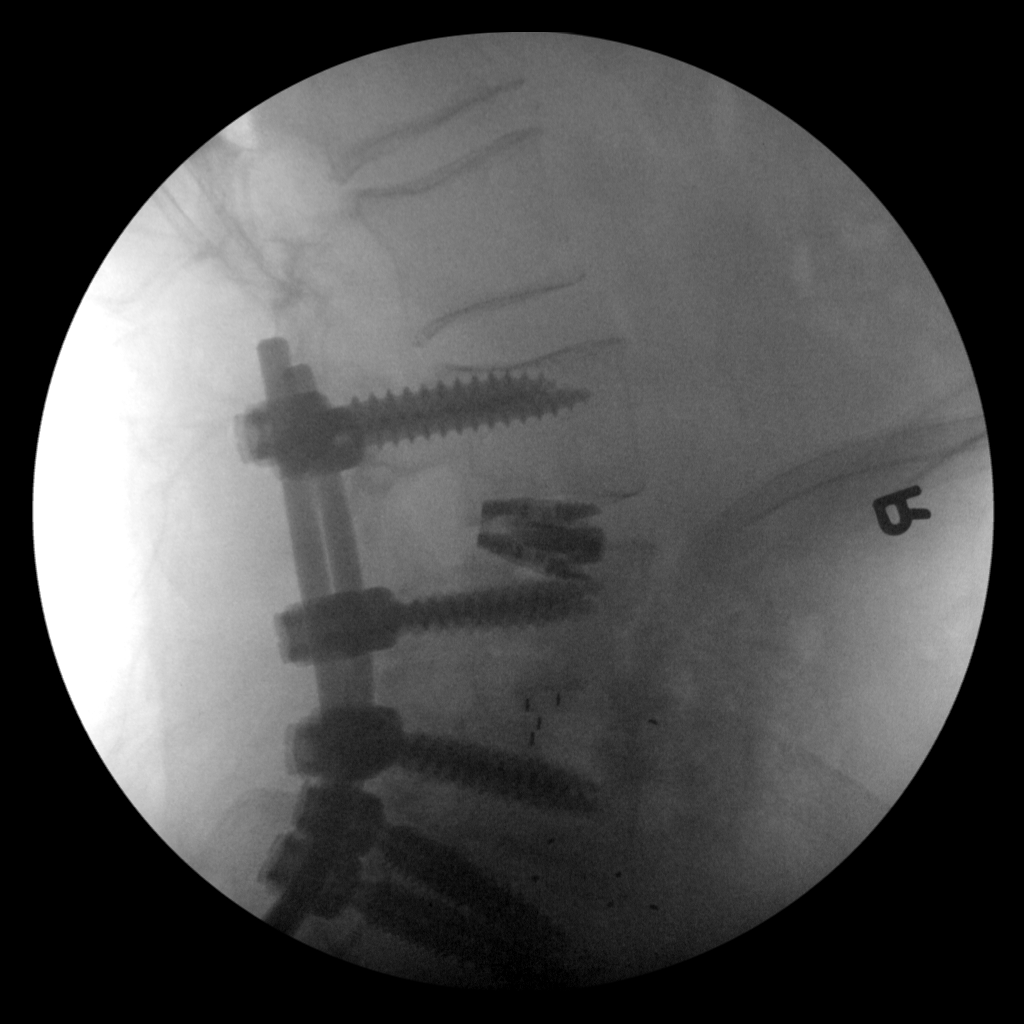
[im 3/3]
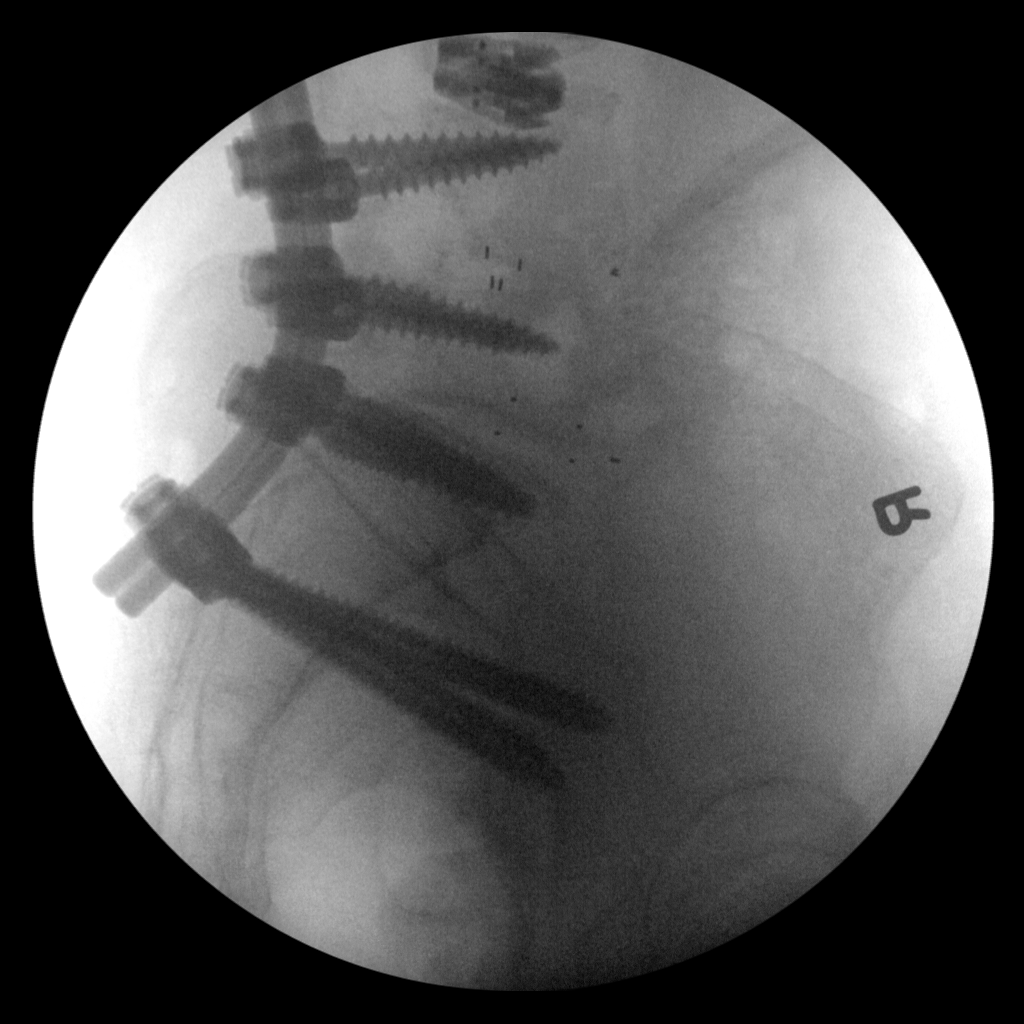

[3 of 3 positions shown; findings below may reference images not displayed]

FINDINGS: Interbody and pedicle screw and rod fusion at the L3 through S2
levels with long bilateral sacral screws bridging the sacroiliac
joints. Normal alignment on these views.
IMPRESSION: Hardware fusion at the L3 through S2 levels.

## 2019-06-29 SURGERY — POSTERIOR LUMBAR FUSION 2 LEVEL
Anesthesia: General | Site: Spine Lumbar

## 2019-06-29 MED ORDER — PROPOFOL 10 MG/ML IV BOLUS
INTRAVENOUS | Status: AC
  Filled 2019-06-29: qty 20

## 2019-06-29 MED ORDER — PROPOFOL 10 MG/ML IV BOLUS
INTRAVENOUS | Status: DC | PRN
  Administered 2019-06-29: 150 mg via INTRAVENOUS

## 2019-06-29 MED ORDER — PHENYLEPHRINE HCL (PRESSORS) 10 MG/ML IV SOLN
INTRAVENOUS | Status: DC | PRN
  Administered 2019-06-29 (×2): 40 ug via INTRAVENOUS
  Administered 2019-06-29 (×2): 80 ug via INTRAVENOUS

## 2019-06-29 MED ORDER — ESCITALOPRAM OXALATE 20 MG PO TABS
20.0000 mg | ORAL_TABLET | Freq: Every day | ORAL | Status: DC
  Administered 2019-06-29 – 2019-06-30 (×2): 20 mg via ORAL
  Filled 2019-06-29 (×2): qty 1

## 2019-06-29 MED ORDER — VITAMIN D 25 MCG (1000 UNIT) PO TABS
1000.0000 [IU] | ORAL_TABLET | Freq: Every day | ORAL | Status: DC
  Administered 2019-06-29 – 2019-06-30 (×2): 1000 [IU] via ORAL
  Filled 2019-06-29 (×2): qty 1

## 2019-06-29 MED ORDER — FLEET ENEMA 7-19 GM/118ML RE ENEM: 1.0000 | ENEMA | Freq: Once | RECTAL | Status: DC | PRN

## 2019-06-29 MED ORDER — ACETAMINOPHEN 10 MG/ML IV SOLN
1000.0000 mg | Freq: Once | INTRAVENOUS | Status: AC
  Administered 2019-06-29: 1000 mg via INTRAVENOUS

## 2019-06-29 MED ORDER — HEMOSTATIC AGENTS (NO CHARGE) OPTIME
TOPICAL | Status: DC | PRN
  Administered 2019-06-29: 1

## 2019-06-29 MED ORDER — BUPIVACAINE LIPOSOME 1.3 % IJ SUSP
20.0000 mL | INTRAMUSCULAR | Status: AC
  Administered 2019-06-29: 20 mL
  Filled 2019-06-29: qty 20

## 2019-06-29 MED ORDER — DEXAMETHASONE SODIUM PHOSPHATE 4 MG/ML IJ SOLN
INTRAMUSCULAR | Status: DC | PRN
  Administered 2019-06-29: 10 mg via INTRAVENOUS

## 2019-06-29 MED ORDER — DOCUSATE SODIUM 100 MG PO CAPS
100.0000 mg | ORAL_CAPSULE | Freq: Two times a day (BID) | ORAL | Status: DC
  Administered 2019-06-29 – 2019-06-30 (×3): 100 mg via ORAL
  Filled 2019-06-29 (×3): qty 1

## 2019-06-29 MED ORDER — POVIDONE-IODINE 7.5 % EX SOLN: Freq: Once | CUTANEOUS | Status: DC

## 2019-06-29 MED ORDER — PHENOL 1.4 % MT LIQD: 1.0000 | OROMUCOSAL | Status: DC | PRN

## 2019-06-29 MED ORDER — CEFAZOLIN SODIUM-DEXTROSE 2-4 GM/100ML-% IV SOLN
2.0000 g | INTRAVENOUS | Status: AC
  Administered 2019-06-29: 2 g via INTRAVENOUS

## 2019-06-29 MED ORDER — ALUM & MAG HYDROXIDE-SIMETH 200-200-20 MG/5ML PO SUSP: 30.0000 mL | Freq: Four times a day (QID) | ORAL | Status: DC | PRN

## 2019-06-29 MED ORDER — SENNOSIDES-DOCUSATE SODIUM 8.6-50 MG PO TABS: 1.0000 | ORAL_TABLET | Freq: Every evening | ORAL | Status: DC | PRN

## 2019-06-29 MED ORDER — KETAMINE HCL 10 MG/ML IJ SOLN
INTRAMUSCULAR | Status: DC | PRN
  Administered 2019-06-29 (×3): 10 mg via INTRAVENOUS

## 2019-06-29 MED ORDER — SUCCINYLCHOLINE CHLORIDE 20 MG/ML IJ SOLN
INTRAMUSCULAR | Status: DC | PRN
  Administered 2019-06-29: 120 mg via INTRAVENOUS

## 2019-06-29 MED ORDER — KETOROLAC TROMETHAMINE 15 MG/ML IJ SOLN
15.0000 mg | Freq: Once | INTRAMUSCULAR | Status: AC
  Administered 2019-06-29: 15 mg via INTRAVENOUS

## 2019-06-29 MED ORDER — HYDROMORPHONE HCL 1 MG/ML IJ SOLN
0.2500 mg | INTRAMUSCULAR | Status: DC | PRN
  Administered 2019-06-29 (×2): 0.5 mg via INTRAVENOUS

## 2019-06-29 MED ORDER — FENTANYL CITRATE (PF) 250 MCG/5ML IJ SOLN
INTRAMUSCULAR | Status: AC
  Filled 2019-06-29: qty 5

## 2019-06-29 MED ORDER — ALBUMIN HUMAN 5 % IV SOLN: INTRAVENOUS | Status: DC | PRN

## 2019-06-29 MED ORDER — ACETAMINOPHEN 10 MG/ML IV SOLN
INTRAVENOUS | Status: AC
  Filled 2019-06-29: qty 100

## 2019-06-29 MED ORDER — SODIUM CHLORIDE 0.9% FLUSH
3.0000 mL | Freq: Two times a day (BID) | INTRAVENOUS | Status: DC
  Administered 2019-06-29: 3 mL via INTRAVENOUS

## 2019-06-29 MED ORDER — CEFAZOLIN SODIUM-DEXTROSE 2-4 GM/100ML-% IV SOLN
2.0000 g | Freq: Three times a day (TID) | INTRAVENOUS | Status: AC
  Administered 2019-06-29 (×2): 2 g via INTRAVENOUS
  Filled 2019-06-29 (×2): qty 100

## 2019-06-29 MED ORDER — OXYCODONE-ACETAMINOPHEN 5-325 MG PO TABS
ORAL_TABLET | ORAL | Status: AC
  Filled 2019-06-29: qty 2

## 2019-06-29 MED ORDER — MEPERIDINE HCL 25 MG/ML IJ SOLN: 6.2500 mg | INTRAMUSCULAR | Status: DC | PRN

## 2019-06-29 MED ORDER — METHYLENE BLUE 0.5 % INJ SOLN
INTRAVENOUS | Status: AC
  Filled 2019-06-29: qty 10

## 2019-06-29 MED ORDER — ONDANSETRON HCL 4 MG/2ML IJ SOLN
INTRAMUSCULAR | Status: DC | PRN
  Administered 2019-06-29: 4 mg via INTRAVENOUS

## 2019-06-29 MED ORDER — LACTATED RINGERS IV SOLN: INTRAVENOUS | Status: DC | PRN

## 2019-06-29 MED ORDER — HYDROMORPHONE HCL 1 MG/ML IJ SOLN
INTRAMUSCULAR | Status: AC
  Filled 2019-06-29: qty 1

## 2019-06-29 MED ORDER — NORTRIPTYLINE HCL 10 MG PO CAPS
30.0000 mg | ORAL_CAPSULE | Freq: Every day | ORAL | Status: DC
  Administered 2019-06-29: 30 mg via ORAL
  Filled 2019-06-29: qty 3

## 2019-06-29 MED ORDER — NALOXONE HCL 4 MG/0.1ML NA LIQD: 1.0000 | Freq: Once | NASAL | Status: DC

## 2019-06-29 MED ORDER — LIDOCAINE HCL (CARDIAC) PF 100 MG/5ML IV SOSY
PREFILLED_SYRINGE | INTRAVENOUS | Status: DC | PRN
  Administered 2019-06-29: 100 mg via INTRAVENOUS

## 2019-06-29 MED ORDER — ROCURONIUM BROMIDE 10 MG/ML (PF) SYRINGE
PREFILLED_SYRINGE | INTRAVENOUS | Status: AC
  Filled 2019-06-29: qty 10

## 2019-06-29 MED ORDER — SODIUM CHLORIDE 0.9% FLUSH: 3.0000 mL | INTRAVENOUS | Status: DC | PRN

## 2019-06-29 MED ORDER — THROMBIN 20000 UNITS EX SOLR
CUTANEOUS | Status: AC
  Filled 2019-06-29: qty 20000

## 2019-06-29 MED ORDER — EPHEDRINE 5 MG/ML INJ
INTRAVENOUS | Status: AC
  Filled 2019-06-29: qty 10

## 2019-06-29 MED ORDER — ONDANSETRON HCL 4 MG/2ML IJ SOLN
INTRAMUSCULAR | Status: AC
  Filled 2019-06-29: qty 2

## 2019-06-29 MED ORDER — SUCCINYLCHOLINE CHLORIDE 200 MG/10ML IV SOSY
PREFILLED_SYRINGE | INTRAVENOUS | Status: AC
  Filled 2019-06-29: qty 10

## 2019-06-29 MED ORDER — OXYCODONE-ACETAMINOPHEN 5-325 MG PO TABS
1.0000 | ORAL_TABLET | ORAL | Status: DC | PRN
  Administered 2019-06-29 (×3): 2 via ORAL
  Administered 2019-06-30 (×2): 1 via ORAL
  Filled 2019-06-29: qty 2
  Filled 2019-06-29 (×2): qty 1
  Filled 2019-06-29: qty 2

## 2019-06-29 MED ORDER — SUGAMMADEX SODIUM 200 MG/2ML IV SOLN
INTRAVENOUS | Status: DC | PRN
  Administered 2019-06-29: 120 mg via INTRAVENOUS

## 2019-06-29 MED ORDER — MIDAZOLAM HCL 2 MG/2ML IJ SOLN
INTRAMUSCULAR | Status: AC
  Filled 2019-06-29: qty 2

## 2019-06-29 MED ORDER — KETAMINE HCL 50 MG/5ML IJ SOSY
PREFILLED_SYRINGE | INTRAMUSCULAR | Status: AC
  Filled 2019-06-29: qty 5

## 2019-06-29 MED ORDER — MENTHOL 3 MG MT LOZG: 1.0000 | LOZENGE | OROMUCOSAL | Status: DC | PRN

## 2019-06-29 MED ORDER — DEXAMETHASONE SODIUM PHOSPHATE 10 MG/ML IJ SOLN
INTRAMUSCULAR | Status: AC
  Filled 2019-06-29: qty 1

## 2019-06-29 MED ORDER — PROPOFOL 500 MG/50ML IV EMUL
INTRAVENOUS | Status: DC | PRN
  Administered 2019-06-29: 30 ug/kg/min via INTRAVENOUS
  Administered 2019-06-29: 40 ug/kg/min via INTRAVENOUS

## 2019-06-29 MED ORDER — 0.9 % SODIUM CHLORIDE (POUR BTL) OPTIME
TOPICAL | Status: DC | PRN
  Administered 2019-06-29 (×2): 1000 mL

## 2019-06-29 MED ORDER — BUPIVACAINE HCL (PF) 0.25 % IJ SOLN
INTRAMUSCULAR | Status: DC | PRN
  Administered 2019-06-29: 10 mL
  Administered 2019-06-29: 20 mL

## 2019-06-29 MED ORDER — SACCHAROMYCES BOULARDII 250 MG PO CAPS
500.0000 mg | ORAL_CAPSULE | Freq: Every day | ORAL | Status: DC
  Administered 2019-06-30: 500 mg via ORAL
  Filled 2019-06-29 (×2): qty 2

## 2019-06-29 MED ORDER — SUMATRIPTAN SUCCINATE 50 MG PO TABS
50.0000 mg | ORAL_TABLET | ORAL | Status: DC | PRN
  Filled 2019-06-29: qty 1

## 2019-06-29 MED ORDER — BUPIVACAINE HCL (PF) 0.25 % IJ SOLN
INTRAMUSCULAR | Status: AC
  Filled 2019-06-29: qty 30

## 2019-06-29 MED ORDER — FLUTICASONE PROPIONATE 50 MCG/ACT NA SUSP
2.0000 | Freq: Two times a day (BID) | NASAL | Status: DC | PRN
  Filled 2019-06-29: qty 16

## 2019-06-29 MED ORDER — THROMBIN 20000 UNITS EX SOLR
CUTANEOUS | Status: DC | PRN
  Administered 2019-06-29: 20 mL via TOPICAL

## 2019-06-29 MED ORDER — LIDOCAINE 2% (20 MG/ML) 5 ML SYRINGE
INTRAMUSCULAR | Status: AC
  Filled 2019-06-29: qty 5

## 2019-06-29 MED ORDER — BISACODYL 5 MG PO TBEC: 5.0000 mg | DELAYED_RELEASE_TABLET | Freq: Every day | ORAL | Status: DC | PRN

## 2019-06-29 MED ORDER — PHENYLEPHRINE 40 MCG/ML (10ML) SYRINGE FOR IV PUSH (FOR BLOOD PRESSURE SUPPORT)
PREFILLED_SYRINGE | INTRAVENOUS | Status: AC
  Filled 2019-06-29: qty 10

## 2019-06-29 MED ORDER — SODIUM CHLORIDE 0.9 % IV SOLN: INTRAVENOUS | Status: DC | PRN

## 2019-06-29 MED ORDER — METHOCARBAMOL 1000 MG/10ML IJ SOLN
500.0000 mg | Freq: Four times a day (QID) | INTRAVENOUS | Status: DC | PRN
  Filled 2019-06-29: qty 5

## 2019-06-29 MED ORDER — KETOROLAC TROMETHAMINE 15 MG/ML IJ SOLN
INTRAMUSCULAR | Status: AC
  Filled 2019-06-29: qty 1

## 2019-06-29 MED ORDER — ONDANSETRON HCL 4 MG/2ML IJ SOLN: 4.0000 mg | Freq: Four times a day (QID) | INTRAMUSCULAR | Status: DC | PRN

## 2019-06-29 MED ORDER — FENTANYL CITRATE (PF) 100 MCG/2ML IJ SOLN
INTRAMUSCULAR | Status: DC | PRN
  Administered 2019-06-29: 50 ug via INTRAVENOUS
  Administered 2019-06-29: 150 ug via INTRAVENOUS
  Administered 2019-06-29 (×3): 50 ug via INTRAVENOUS

## 2019-06-29 MED ORDER — MIDAZOLAM HCL 2 MG/2ML IJ SOLN
INTRAMUSCULAR | Status: DC | PRN
  Administered 2019-06-29 (×2): 1 mg via INTRAVENOUS

## 2019-06-29 MED ORDER — CEFAZOLIN SODIUM-DEXTROSE 2-4 GM/100ML-% IV SOLN
INTRAVENOUS | Status: AC
  Filled 2019-06-29: qty 100

## 2019-06-29 MED ORDER — ACETAMINOPHEN 325 MG PO TABS: 650.0000 mg | ORAL_TABLET | ORAL | Status: DC | PRN

## 2019-06-29 MED ORDER — METHYLENE BLUE 0.5 % INJ SOLN
INTRAVENOUS | Status: DC | PRN
  Administered 2019-06-29: 1 mL

## 2019-06-29 MED ORDER — ROCURONIUM BROMIDE 100 MG/10ML IV SOLN
INTRAVENOUS | Status: DC | PRN
  Administered 2019-06-29: 30 mg via INTRAVENOUS

## 2019-06-29 MED ORDER — PHENYLEPHRINE HCL-NACL 10-0.9 MG/250ML-% IV SOLN
INTRAVENOUS | Status: DC | PRN
  Administered 2019-06-29: 30 ug/min via INTRAVENOUS

## 2019-06-29 MED ORDER — METHOCARBAMOL 500 MG PO TABS
500.0000 mg | ORAL_TABLET | Freq: Four times a day (QID) | ORAL | Status: DC | PRN
  Administered 2019-06-29 – 2019-06-30 (×2): 500 mg via ORAL
  Filled 2019-06-29 (×2): qty 1

## 2019-06-29 MED ORDER — POTASSIUM CHLORIDE IN NACL 20-0.9 MEQ/L-% IV SOLN: INTRAVENOUS | Status: DC

## 2019-06-29 MED ORDER — EPHEDRINE SULFATE 50 MG/ML IJ SOLN
INTRAMUSCULAR | Status: DC | PRN
  Administered 2019-06-29: 5 mg via INTRAVENOUS

## 2019-06-29 MED ORDER — MORPHINE SULFATE (PF) 2 MG/ML IV SOLN: 1.0000 mg | INTRAVENOUS | Status: DC | PRN

## 2019-06-29 MED ORDER — ONDANSETRON HCL 4 MG PO TABS: 4.0000 mg | ORAL_TABLET | Freq: Four times a day (QID) | ORAL | Status: DC | PRN

## 2019-06-29 MED ORDER — ACETAMINOPHEN 650 MG RE SUPP: 650.0000 mg | RECTAL | Status: DC | PRN

## 2019-06-29 MED ORDER — SODIUM CHLORIDE 0.9 % IV SOLN: 250.0000 mL | INTRAVENOUS | Status: DC

## 2019-06-29 SURGICAL SUPPLY — 96 items
BENZOIN TINCTURE PRP APPL 2/3 (GAUZE/BANDAGES/DRESSINGS) IMPLANT
BLADE CLIPPER SURG (BLADE) IMPLANT
BONE VIVIGEN FORMABLE 10CC (Bone Implant) ×2 IMPLANT
BUR PRESCISION 1.7 ELITE (BURR) ×2 IMPLANT
BUR ROUND FLUTED 5 RND (BURR) ×2 IMPLANT
BUR ROUND PRECISION 4.0 (BURR) IMPLANT
BUR SABER RD CUTTING 3.0 (BURR) IMPLANT
CARTRIDGE OIL MAESTRO DRILL (MISCELLANEOUS) ×2 IMPLANT
CONT SPEC 4OZ CLIKSEAL STRL BL (MISCELLANEOUS) ×2 IMPLANT
COVER SURGICAL LIGHT HANDLE (MISCELLANEOUS) ×2 IMPLANT
COVER WAND RF STERILE (DRAPES) ×2 IMPLANT
DIFFUSER DRILL AIR PNEUMATIC (MISCELLANEOUS) ×4 IMPLANT
DRAIN CHANNEL 15F RND FF W/TCR (WOUND CARE) ×2 IMPLANT
DRAPE C-ARM 42X72 X-RAY (DRAPES) ×2 IMPLANT
DRAPE POUCH INSTRU U-SHP 10X18 (DRAPES) ×2 IMPLANT
DRAPE SURG 17X23 STRL (DRAPES) ×8 IMPLANT
DRSG MEPILEX BORDER 4X12 (GAUZE/BANDAGES/DRESSINGS) IMPLANT
DRSG MEPILEX BORDER 4X8 (GAUZE/BANDAGES/DRESSINGS) IMPLANT
DURAPREP 26ML APPLICATOR (WOUND CARE) ×2 IMPLANT
ELECT BLADE 4.0 EZ CLEAN MEGAD (MISCELLANEOUS) ×2
ELECT CAUTERY BLADE 6.4 (BLADE) ×4 IMPLANT
ELECT REM PT RETURN 9FT ADLT (ELECTROSURGICAL) ×2
ELECTRODE BLDE 4.0 EZ CLN MEGD (MISCELLANEOUS) ×1 IMPLANT
ELECTRODE REM PT RTRN 9FT ADLT (ELECTROSURGICAL) ×1 IMPLANT
EVACUATOR SILICONE 100CC (DRAIN) ×2 IMPLANT
FEE INTRAOP MONITOR IMPULS NCS (MISCELLANEOUS) ×1 IMPLANT
GAUZE 4X4 16PLY RFD (DISPOSABLE) ×4 IMPLANT
GAUZE SPONGE 4X4 12PLY STRL (GAUZE/BANDAGES/DRESSINGS) ×2 IMPLANT
GAUZE SPONGE 4X4 12PLY STRL LF (GAUZE/BANDAGES/DRESSINGS) ×2 IMPLANT
GLOVE BIO SURGEON STRL SZ7 (GLOVE) ×2 IMPLANT
GLOVE BIO SURGEON STRL SZ8 (GLOVE) ×2 IMPLANT
GLOVE BIOGEL PI IND STRL 7.0 (GLOVE) ×1 IMPLANT
GLOVE BIOGEL PI IND STRL 8 (GLOVE) ×1 IMPLANT
GLOVE BIOGEL PI INDICATOR 7.0 (GLOVE) ×1
GLOVE BIOGEL PI INDICATOR 8 (GLOVE) ×1
GOWN STRL REUS W/ TWL LRG LVL3 (GOWN DISPOSABLE) ×2 IMPLANT
GOWN STRL REUS W/ TWL XL LVL3 (GOWN DISPOSABLE) ×1 IMPLANT
GOWN STRL REUS W/TWL LRG LVL3 (GOWN DISPOSABLE) ×2
GOWN STRL REUS W/TWL XL LVL3 (GOWN DISPOSABLE) ×1
GUIDEWIRE VIPER BT 1.37 (WIRE) ×4 IMPLANT
INTRAOP MONITOR FEE IMPULS NCS (MISCELLANEOUS) ×1
INTRAOP MONITOR FEE IMPULSE (MISCELLANEOUS) ×1
IV CATH 14GX2 1/4 (CATHETERS) ×2 IMPLANT
KIT BASIN OR (CUSTOM PROCEDURE TRAY) ×2 IMPLANT
KIT INFUSE SMALL (Orthopedic Implant) ×2 IMPLANT
KIT POSITION SURG JACKSON T1 (MISCELLANEOUS) ×2 IMPLANT
KIT TURNOVER KIT B (KITS) ×2 IMPLANT
MARKER SKIN DUAL TIP RULER LAB (MISCELLANEOUS) ×2 IMPLANT
NEEDLE HYPO 25GX1X1/2 BEV (NEEDLE) ×2 IMPLANT
NEEDLE SPNL 18GX3.5 QUINCKE PK (NEEDLE) ×4 IMPLANT
NS IRRIG 1000ML POUR BTL (IV SOLUTION) ×2 IMPLANT
OIL CARTRIDGE MAESTRO DRILL (MISCELLANEOUS) ×4
PACK LAMINECTOMY ORTHO (CUSTOM PROCEDURE TRAY) ×2 IMPLANT
PACK UNIVERSAL I (CUSTOM PROCEDURE TRAY) ×2 IMPLANT
PAD ARMBOARD 7.5X6 YLW CONV (MISCELLANEOUS) ×4 IMPLANT
PATTIES SURGICAL .5 X1 (DISPOSABLE) ×4 IMPLANT
PATTIES SURGICAL .5 X3 (DISPOSABLE) IMPLANT
PATTIES SURGICAL .5X1.5 (GAUZE/BANDAGES/DRESSINGS) ×2 IMPLANT
PATTIES SURGICAL .75X.75 (GAUZE/BANDAGES/DRESSINGS) ×4 IMPLANT
PROBE PED 2.3 SCRW/BT NCS (MISCELLANEOUS) ×1 IMPLANT
PROBE PEDCLE 2.3 SCRW/BALL TIP (MISCELLANEOUS) ×1
ROD SPINAL 5.5X130 TITAN (Rod) ×4 IMPLANT
SCREW EXPEDIUM 5.5 8X40 (Screw) ×2 IMPLANT
SCREW EXPEDIUM POLYAXIAL 7X40M (Screw) ×8 IMPLANT
SCREW MIS TI CFX FEN POLY 9X40 (Screw) ×4 IMPLANT
SCREW POLYAXIAL 7X45MM (Screw) ×2 IMPLANT
SCREW VIPER T27 ILIAC 9X90 TI (Screw) ×4 IMPLANT
SPONGE INTESTINAL PEANUT (DISPOSABLE) IMPLANT
SPONGE LAP 4X18 RFD (DISPOSABLE) ×4 IMPLANT
SPONGE SURGIFOAM ABS GEL 100 (HEMOSTASIS) ×2 IMPLANT
STRIP CLOSURE SKIN 1/2X4 (GAUZE/BANDAGES/DRESSINGS) ×4 IMPLANT
SURGIFLO W/THROMBIN 8M KIT (HEMOSTASIS) IMPLANT
SUT BONE WAX W31G (SUTURE) ×4 IMPLANT
SUT MNCRL AB 4-0 PS2 18 (SUTURE) ×2 IMPLANT
SUT VIC AB 0 CT1 18XCR BRD 8 (SUTURE) ×2 IMPLANT
SUT VIC AB 0 CT1 8-18 (SUTURE) ×2
SUT VIC AB 1 CT1 18XCR BRD 8 (SUTURE) ×4 IMPLANT
SUT VIC AB 1 CT1 8-18 (SUTURE) ×4
SUT VIC AB 2-0 CT2 18 VCP726D (SUTURE) ×10 IMPLANT
SYR 20ML LL LF (SYRINGE) ×2 IMPLANT
SYR BULB IRRIGATION 50ML (SYRINGE) ×2 IMPLANT
SYR CONTROL 10ML LL (SYRINGE) ×4 IMPLANT
SYR TB 1ML LUER SLIP (SYRINGE) ×2 IMPLANT
TAP CANN VIPER2 8 DUEL LEAD (TAP) ×4 IMPLANT
TAP EXPEDIUM DL 6.0 (INSTRUMENTS) ×2 IMPLANT
TAP EXPEDIUM DL 7.0 (INSTRUMENTS) ×2
TAP EXPEDIUM DL 7X2 (INSTRUMENTS) ×1 IMPLANT
TAP EXPEDIUM DL 8.0 (INSTRUMENTS) ×2 IMPLANT
TAP EXPEDIUM DL 9X2 (INSTRUMENTS) ×1 IMPLANT
TAP VIPER2 9 DUAL LEAD (TAP) ×2 IMPLANT
TAPE CLOTH SURG 6X10 WHT LF (GAUZE/BANDAGES/DRESSINGS) ×2 IMPLANT
TOWEL GREEN STERILE (TOWEL DISPOSABLE) ×2 IMPLANT
TOWEL GREEN STERILE FF (TOWEL DISPOSABLE) ×2 IMPLANT
TRAY FOLEY MTR SLVR 16FR STAT (SET/KITS/TRAYS/PACK) ×2 IMPLANT
WATER STERILE IRR 1000ML POUR (IV SOLUTION) ×2 IMPLANT
YANKAUER SUCT BULB TIP NO VENT (SUCTIONS) ×6 IMPLANT

## 2019-06-29 NOTE — Transfer of Care (Signed)
Immediate Anesthesia Transfer of Care Note  Patient: Sharon Logan  Procedure(s) Performed: REVISION POSTERIOR SPINAL FUSION LUMBAR 3-4, LUMBAR 5 - SACRUM 1 WITH INSTRUMENTATION AND ALLOGRAFT AND BONE MORPHOGENIC PROTEIN (N/A Spine Lumbar)  Patient Location: PACU  Anesthesia Type:General  Level of Consciousness: sedated  Airway & Oxygen Therapy: Patient Spontanous Breathing and Patient connected to face mask oxygen  Post-op Assessment: Post -op Vital signs reviewed and stable  Post vital signs: stable  Last Vitals:  Vitals Value Taken Time  BP 131/71 06/29/19 1311  Temp    Pulse 80 06/29/19 1311  Resp 22 06/29/19 1311  SpO2 100 % 06/29/19 1311  Vitals shown include unvalidated device data.  Last Pain:  Vitals:   06/29/19 0610  PainSc: 0-No pain         Complications: No apparent anesthesia complications

## 2019-06-29 NOTE — Op Note (Addendum)
PATIENT NAME: Sharon Logan  MEDICAL RECORD NO.:  [id number]  DATE OF BIRTH: 10/15/1955  DATE OF PROCEDURE: 06/29/2019  OPERATIVE REPORT   PREOPERATIVE DIAGNOSES: 1.L3/4, L5/S1 nonunion s/p previous an L3-S1 fusion procedure by another provider 2. Low back pain  POSTOPERATIVE DIAGNOSES: 1.L3/4, L5/S1 nonunion s/p previous an L3-S1 fusion procedure by another provider 2. Low back pain  PROCEDURES: 1. Revision posterior spinal fusion, L3/4, L5/S1 2. Placement of pelvic fixation into the iliac crest bilaterally 3. Use of floroscopy 4. Use of allorgraft and bone morphogenic protein 5. Removal and reinsertion of spinal fixation  SURGEON: Phylliss Bob, MD.  ASSISTANTPricilla Holm, PA-C.  ANESTHESIA: General endotracheal anesthesia.  COMPLICATIONS: None.  DISPOSITION: Stable.  ESTIMATED BLOOD LOSS:900cc with 200cc retransfused  INDICATIONS FOR SURGERY: Briefly,Ms. Olsonis a pleasant 64 year old female who is status post an extensive lumbar fusion procedure, spanning L3-S1.  She went on to have ongoing substantial pain in the back.  A CAT scan did reveal an obvious nonunion at L3-4 and L5-S1.  There was obvious loosening of particularly the bilateral L3 screws in the bilateral S1 screws.  Given her ongoing pain, we did discuss proceeding with a revision posterior fusion procedure.  The patient did fully understand the risks and limitations of surgery, and did wish to proceed.   OPERATIVE DETAILS: On1/14/2021, the patient was brought to surgery and general endotracheal anesthesia was administered. The patient was placed prone on a well-padded flat Jackson bed with a spinal frame. Antibiotics were given and a time-out procedure was performed. The back was prepped and draped in the usual fashion.    The patient's previously noted paramedian incisions were utilized, and the paramedian incisions were extended both superiorly  and inferiorly.  Using a Wiltsie approach, the bilateral hardware spanning L3-S1 was identified.  The caps and rods were removed bilaterally, as were the pedicle screws.  Of note, the bilateral S1 pedicle screws were noted to be very loose, as were the bilateral L3 pedicle screws.  I did upsize each of the screws.  Specifically, the S1 pedicle screws were tapped up to 9 mm, the L5 screws were tapped up to 7 mm, as were the L4 pedicles.  The L3 pedicle on the left was tapped up to 8 mm, and the L3 pedicle on the tapped was sized up to 7 mm.  On the preoperative CAT scan, the bilateral L3 pedicle screws were extremely close to the medial border of the pedicles.  I did however need to upsize slightly, from 7.5 mm to 7 mm on the right.  A ball-tipped probe did confirm that there is no cortical violation.  At this point, bone wax was placed in the cannulated pedicle holes.  I then identify the entry point  At this point, the bilateral S1 screws were removed.  They were noted to be very loose, as the preoperative CAT scan did suggest.    At this point, liberally using multiple fluoroscopic views, I did cannulate the iliac crest using an "SAI" technique.  Specifically, I did advance in all through the sacrum, between the S1 and S2 foramena, across the sacroiliac joint, and into the iliac crests bilaterally.  A ball-tipped probe was used to confirm that there was no cortical violation.  A ball-tipped probe did confirm that the screw did not violate the crests bilaterally.  Intraoperative use of fluoroscopy was liberally used, using multiple views, and it was clear that the cannulated holes were in a very safe position and well within  the iliac crests.  At this point, 9 x 90 mm were advanced.  I was very pleased with the resting position of the SAI screws.  At this point, I suppressed exposed and decorticated the transverse processes of L3, L4, L5, and the sacral ala.  The facet joints were also decorticated.  At this  point, screws were placed from L3-S1.  I was very pleased with the purchase of each of the screws.  130 millimeters rods were secured over the tulip heads of the screws from L3 down to the pelvic screws.  Caps were placed and a final locking procedure was performed.  I was very pleased with the final AP and lateral fluoroscopic images.  At this point, a small kit of BMP was cut into 4 equal sizes, and the collagen sponges were placed along the posterior elements across L3-4 and L5-S1 bilaterally.  Vivigen was then packed over the top of the BMP.  Prior to placing the BMP and bone graft, the wound was copiously irrigated with a total of approximately 2 L of normal saline.  Of note, I did test each of the screws using triggered EMG, and there was no screw that tested below 15 mA, with the exception of the right L3 pedicle screw.  A ball-tipped probe was used to confirm that there is no cortical violation of the screw, however, I was well aware that the screw did very much approach the medial border of the pedicle, based on the preoperative CT.  I did however feel that upsizing the screw by 0.5 mm was needed in order to provide structural stability. The paramedian incisions were then closed in layers using #1 Vicryl followed by 0 Vicryl, followed by 2-0 Vicryl, followed by 4-0 Monocryl. Benzoin and Steri-Strips were applied followed by sterile dressing.   There was no sustained abnormal EMG activity noted throughout the surgery.  Of note, Pricilla Holm was my assistant throughout surgery, and did aid in retraction, suctioning, placement of hardware, and closure.   Phylliss Bob, MD

## 2019-06-29 NOTE — Anesthesia Postprocedure Evaluation (Signed)
Anesthesia Post Note  Patient: Sharon Logan  Procedure(s) Performed: REVISION POSTERIOR SPINAL FUSION LUMBAR 3-4, LUMBAR 5 - SACRUM 1 WITH INSTRUMENTATION AND ALLOGRAFT AND BONE MORPHOGENIC PROTEIN (N/A Spine Lumbar)     Patient location during evaluation: PACU Anesthesia Type: General Level of consciousness: awake Pain management: pain level controlled Vital Signs Assessment: post-procedure vital signs reviewed and stable Respiratory status: spontaneous breathing Cardiovascular status: stable Postop Assessment: no apparent nausea or vomiting Anesthetic complications: no    Last Vitals:  Vitals:   06/29/19 1440 06/29/19 1455  BP: (!) 110/57 (!) 104/58  Pulse: 86 82  Resp: 18 (!) 22  Temp:    SpO2: 96% 95%    Last Pain:  Vitals:   06/29/19 1455  PainSc: 4    Pain Goal:    LLE Motor Response: Purposeful movement (06/29/19 1440) LLE Sensation: Full sensation (06/29/19 1440) RLE Motor Response: Purposeful movement (06/29/19 1440) RLE Sensation: Full sensation (06/29/19 1440)        Huston Foley

## 2019-06-29 NOTE — H&P (Signed)
PREOPERATIVE H&P  Chief Complaint: Low back pain   HPI: Sharon Logan is a 64 y.o. female who presents with ongoing pain in the low back s/p a previous lumbar fusion  CT reveals a nonunion at L3/4 and L5/S1  Patient has failed multiple forms of conservative care and continues to have pain (see office notes for additional details regarding the patient's full course of treatment)  Past Medical History:  Diagnosis Date  . Allergic rhinitis   . Anxiety   . Arthritis   . Cancer ([place])    skin  . Chronic pain   . Chronically dry eyes   . Depression   . Fibromyalgia   . Headache   . Vitamin D deficiency   . Wears glasses    Past Surgical History:  Procedure Laterality Date  . ABDOMINAL HYSTERECTOMY    . BACK SURGERY    . BUNIONECTOMY Bilateral   . NASAL SINUS SURGERY     x2  . SKIN CANCER EXCISION     face x3   Social History   Socioeconomic History  . Marital status: Widowed    Spouse name: Not on file  . Number of children: Not on file  . Years of education: Not on file  . Highest education level: Not on file  Occupational History  . Not on file  Tobacco Use  . Smoking status: Never Smoker  . Smokeless tobacco: Never Used  Substance and Sexual Activity  . Alcohol use: No  . Drug use: No  . Sexual activity: Not on file  Other Topics Concern  . Not on file  Social History Narrative  . Not on file   Social Determinants of Health   Financial Resource Strain:   . Difficulty of Paying Living Expenses: Not on file  Food Insecurity:   . Worried About Charity fundraiser in the Last Year: Not on file  . Ran Out of Food in the Last Year: Not on file  Transportation Needs:   . Lack of Transportation (Medical): Not on file  . Lack of Transportation (Non-Medical): Not on file  Physical Activity:   . Days of Exercise per Week: Not on file  . Minutes of Exercise per Session: Not on file  Stress:   . Feeling of Stress : Not on file  Social Connections:   .  Frequency of Communication with Friends and Family: Not on file  . Frequency of Social Gatherings with Friends and Family: Not on file  . Attends Religious Services: Not on file  . Active Member of Clubs or Organizations: Not on file  . Attends Archivist Meetings: Not on file  . Marital Status: Not on file   Family History  Problem Relation Age of Onset  . Depression Mother   . Heart attack Mother   . COPD Mother   . Depression Father   . Heart attack Father   . ADD / ADHD Son    Allergies  Allergen Reactions  . Gabapentin Other (See Comments)  . Polymyxin B Other (See Comments)    Eyes go blood red  . Cymbalta [Duloxetine Hcl] Other (See Comments)    Headaches, constipation  . Duloxetine Other (See Comments)    HEADACHES CONSTIPATION  . Lamotrigine Rash  . Other Other (See Comments)    OTOBIOTIC > RED EYES  . Pregabalin Rash    Itchy red rash on chest  . Prozac [Fluoxetine] Other (See Comments)  CONSTIPATION  . Zolpidem Rash    "sleep walk"   Prior to Admission medications   Medication Sig Start Date End Date Taking? Authorizing Provider  Carboxymethylcellulose Sodium (THERATEARS OP) Apply 1 drop to eye daily as needed (dry eyes).   Yes [provider]  celecoxib (CELEBREX) 200 MG capsule Take 200 mg by mouth daily as needed for mild pain.  09/08/18  Yes [provider]  escitalopram (LEXAPRO) 20 MG tablet Take 1 tablet (20 mg total) by mouth daily. 05/16/18 06/26/19 Yes Thayer Headings, PMHNP  fluticasone (FLONASE) 50 MCG/ACT nasal spray Place 2 sprays into both nostrils 2 (two) times daily as needed for allergies or rhinitis.   Yes [provider]  HYDROcodone-acetaminophen (NORCO) 10-325 MG tablet Take 2 tablets by mouth 3 (three) times daily as needed for pain. 06/10/19  Yes [provider]  Lidocaine 1.8 % PTCH Apply 1 patch topically daily. 07/15/18  Yes Trula Slade, DPM  NARCAN 4 MG/0.1ML LIQD nasal spray kit  Place 1 spray into the nose once. Repeat every 3 minutes as needed if no or minimal response. 04/29/19  Yes [provider]  nortriptyline (PAMELOR) 10 MG capsule Take 30 mg by mouth at bedtime. 05/13/19  Yes [provider]  olopatadine (PATANOL) 0.1 % ophthalmic solution Place 1 drop into both eyes 2 (two) times daily as needed for allergies.  07/27/18  Yes [provider]  OVER THE COUNTER MEDICATION Take 2 capsules by mouth 2 (two) times daily as needed (allergies). Sinus Support EF   Yes [provider]  rizatriptan (MAXALT) 10 MG tablet Take 10 mg by mouth daily as needed for migraine.  09/08/18  Yes [provider]  saccharomyces boulardii (FLORASTOR) 250 MG capsule Take 500 mg by mouth daily.   Yes [provider]  VITAMIN D PO Take 4 capsules by mouth daily.   Yes [provider]  diclofenac sodium (VOLTAREN) 1 % GEL Apply 2 g topically 4 (four) times daily. Rub into affected area of foot 2 to 4 times daily 10/13/18   Trula Slade, DPM  eletriptan (RELPAX) 40 MG tablet Take 40 mg by mouth as needed for migraine or headache. May repeat in 2 hours if headache persists or recurs.    [provider]  naproxen (NAPROSYN) 500 MG tablet Take 500 mg by mouth 2 (two) times daily as needed for mild pain.     [provider]     All other systems have been reviewed and were otherwise negative with the exception of those mentioned in the HPI and as above.  Physical Exam: Vitals:   06/29/19 0546  BP: 135/68  Pulse: 63  Resp: 18  Temp: 97.8 F (36.6 C)  SpO2: 100%    Body mass index is 24.72 kg/m.  General: Alert, no acute distress Cardiovascular: No pedal edema Respiratory: No cyanosis, no use of accessory musculature Skin: No lesions in the area of chief complaint Neurologic: Sensation intact distally Psychiatric: Patient is competent for consent with normal mood and affect Lymphatic: No axillary or  cervical lymphadenopathy   Assessment/Plan: LUMBAR 3-4 LUMBAR 5-SACRUM 1 NONUNION Plan for Procedure(s): REVISION POSTERIOR SPINAL FUSION LUMBAR 3-4, LUMBAR 5 - SACRUM 1 WITH INSTRUMENTATION AND ALLOGRAFT AND BONE MORPHOGENIC PROTEIN   Norva Karvonen, MD 06/29/2019 6:47 AM

## 2019-06-29 NOTE — Anesthesia Preprocedure Evaluation (Addendum)
Anesthesia Evaluation  Patient identified by MRN, date of birth, ID band Patient awake    Reviewed: Allergy & Precautions, NPO status , Patient's Chart, lab work & pertinent test results  Airway Mallampati: II  TM Distance: >3 FB Neck ROM: Full    Dental  (+) Teeth Intact, Poor Dentition, Caps, Dental Advisory Given,    Pulmonary neg pulmonary ROS,    Pulmonary exam normal breath sounds clear to auscultation       Cardiovascular negative cardio ROS Normal cardiovascular exam Rhythm:Regular Rate:Normal     Neuro/Psych    GI/Hepatic Neg liver ROS,   Endo/Other  negative endocrine ROS  Renal/GU negative Renal ROS  negative genitourinary   Musculoskeletal  (+) Arthritis ,   Abdominal Normal abdominal exam  (+)   Peds  Hematology negative hematology ROS (+)   Anesthesia Other Findings   Reproductive/Obstetrics                           Anesthesia Physical Anesthesia Plan  ASA: II  Anesthesia Plan: General   Post-op Pain Management:    Induction: Intravenous  PONV Risk Score and Plan: 4 or greater and Ondansetron, Dexamethasone and Midazolam  Airway Management Planned: Oral ETT  Additional Equipment: None  Intra-op Plan:   Post-operative Plan: Extubation in OR  Informed Consent: I have reviewed the patients History and Physical, chart, labs and discussed the procedure including the risks, benefits and alternatives for the proposed anesthesia with the patient or authorized representative who has indicated his/her understanding and acceptance.     Dental advisory given  Plan Discussed with: CRNA  Anesthesia Plan Comments:         Anesthesia Quick Evaluation

## 2019-06-29 NOTE — Anesthesia Procedure Notes (Addendum)
Procedure Name: Intubation Date/Time: 06/29/2019 7:39 AM Performed by: Jenne Campus, CRNA Pre-anesthesia Checklist: Patient identified, Emergency Drugs available, Suction available and Patient being monitored Patient Re-evaluated:Patient Re-evaluated prior to induction Oxygen Delivery Method: Circle system utilized Preoxygenation: Pre-oxygenation with 100% oxygen Induction Type: IV induction Ventilation: Mask ventilation without difficulty Laryngoscope Size: Miller and 2 Grade View: Grade I Tube size: 7.0 mm Number of attempts: 2 Airway Equipment and Method: Bougie stylet Placement Confirmation: ETT inserted through vocal cords under direct vision,  positive ETCO2,  CO2 detector and breath sounds checked- equal and bilateral Secured at: 21 cm Tube secured with: Tape Dental Injury: Teeth and Oropharynx as per pre-operative assessment  Comments: Smooth IV induction. Easy mask. DL x2 Sabra Heck 2, Grade I view - unable to pass ETT d/t anterior airway. Bougie used successfully. Atraumatic intubation. Positive ETCO2, BBSE.

## 2019-06-30 MED ORDER — OXYCODONE-ACETAMINOPHEN 5-325 MG PO TABS: 1.0000 | ORAL_TABLET | ORAL | 0 refills | Status: DC | PRN

## 2019-06-30 MED ORDER — METHOCARBAMOL 500 MG PO TABS: 500.0000 mg | ORAL_TABLET | Freq: Four times a day (QID) | ORAL | 1 refills | Status: DC | PRN

## 2019-06-30 MED FILL — Sodium Chloride IV Soln 0.9%: INTRAVENOUS | Qty: 3000 | Status: AC

## 2019-06-30 MED FILL — Heparin Sodium (Porcine) Inj 1000 Unit/ML: INTRAMUSCULAR | Qty: 30 | Status: AC

## 2019-06-30 NOTE — Plan of Care (Signed)
Patient alert and oriented, mae's well, voiding adequate amount of urine, swallowing without difficulty, no c/o pain at time of discharge. Patient discharged home with family. Script and discharged instructions given to patient. Patient and family stated understanding of instructions given. Patient has an appointment with Dr. Dumonski 

## 2019-06-30 NOTE — Evaluation (Signed)
Occupational Therapy Evaluation Patient Details Name: Sharon Logan MRN: [id number] DOB: Sep 06, 1955 Today's Date: 06/30/2019    History of Present Illness Pt is a 64 y/o female with prior back surgery CT revealing nonunion at L3-4, L5-S1 s/p revision posterior spinal lumbar fusion L3-4 and L5-S1.    Clinical Impression   PTA patient independent. Admitted for above and limited by problem list below, including pain, precautions.  She was educated on brace mgmt and wear schedule, safety, ADL compensatory techniques, recommendations and DME. She is completing ADLs, transfers and mobility at supervision to modified independent level. Able to recall and adhere to precautions during session without cueing.  She verbalizes understanding of recommendations, safety and compensatory techniques.  Based on performance today, no further OT need identified and OT will sign off.      Follow Up Recommendations  No OT follow up    Equipment Recommendations  3 in 1 bedside commode    Recommendations for Other Services PT consult     Precautions / Restrictions Precautions Precautions: Back Precaution Booklet Issued: Yes (comment) Precaution Comments: provided handout and reviewed precautions  Required Braces or Orthoses: Spinal Brace Spinal Brace: Thoracolumbosacral orthotic Spinal Brace Comments: applied in sitting or standing per order set Restrictions Weight Bearing Restrictions: No Other Position/Activity Restrictions: no bending/arching/twisting      Mobility Bed Mobility Overal bed mobility: Needs Assistance Bed Mobility: Rolling;Sidelying to Sit Rolling: Supervision Sidelying to sit: Supervision       General bed mobility comments: for log roll technique, pt supine in bed with brace on and educated on only wearing brace when sitting or OOB   Transfers Overall transfer level: Needs assistance Equipment used: None Transfers: Sit to/from Stand Sit to Stand: Modified independent  (Device/Increase time)         General transfer comment: no assist required, good posture and technique    Balance Overall balance assessment: Mild deficits observed, not formally tested                                         ADL either performed or assessed with clinical judgement   ADL Overall ADL's : Needs assistance/impaired Eating/Feeding: Modified independent;Sitting   Grooming: Modified independent;Standing   Upper Body Bathing: Supervision/ safety;Sitting   Lower Body Bathing: Supervison/ safety;Sit to/from stand   Upper Body Dressing : Modified independent;Sitting   Lower Body Dressing: Modified independent;Sit to/from stand;Adhering to back precautions   Toilet Transfer: Modified Independent;Ambulation Toilet Transfer Details (indicate cue type and reason): simulated in room     Tub/ Shower Transfer: Tub transfer;Supervision/safety;3 in 1;Ambulation Tub/Shower Transfer Details (indicate cue type and reason): simulated in room  Functional mobility during ADLs: Modified independent General ADL Comments: patient educated on brace mgmt, safety, ADL compensaotry techniques and back precautions; patient able to complete ADLs with modified independence after education of techniques     Vision   Vision Assessment?: No apparent visual deficits     Perception     Praxis      Pertinent Vitals/Pain Pain Assessment: Faces Faces Pain Scale: Hurts a little bit Pain Location: generalized aching and soreness Pain Descriptors / Indicators: Discomfort;Operative site guarding Pain Intervention(s): Limited activity within patient's tolerance;Monitored during session;Repositioned     Hand Dominance Right   Extremity/Trunk Assessment Upper Extremity Assessment Upper Extremity Assessment: Overall WFL for tasks assessed   Lower Extremity Assessment Lower Extremity Assessment: Defer to PT  evaluation   Cervical / Trunk Assessment Cervical / Trunk  Assessment: Other exceptions Cervical / Trunk Exceptions: s/p back surgery   Communication Communication Communication: No difficulties   Cognition Arousal/Alertness: Awake/alert Behavior During Therapy: WFL for tasks assessed/performed Overall Cognitive Status: Within Functional Limits for tasks assessed                                 General Comments: very pleasant and cooperative, eager to participate in mobility   General Comments       Exercises     Shoulder Instructions      Home Living Family/patient expects to be discharged to:: Private residence Living Arrangements: Alone Available Help at Discharge: Family;Available PRN/intermittently Type of Home: House Home Access: Level entry     Home Layout: One level     Bathroom Shower/Tub: Occupational psychologist: Standard Bathroom Accessibility: No   Home Equipment: None          Prior Functioning/Environment Level of Independence: Independent                 OT Problem List: Decreased activity tolerance;Decreased knowledge of use of DME or AE;Decreased knowledge of precautions      OT Treatment/Interventions:      OT Goals(Current goals can be found in the care plan section) Acute Rehab OT Goals Patient Stated Goal: home today  OT Goal Formulation: With patient  OT Frequency:     Barriers to D/C:            Co-evaluation              AM-PAC OT "6 Clicks" Daily Activity     Outcome Measure Help from another person eating meals?: None Help from another person taking care of personal grooming?: None Help from another person toileting, which includes using toliet, bedpan, or urinal?: None Help from another person bathing (including washing, rinsing, drying)?: None Help from another person to put on and taking off regular upper body clothing?: None Help from another person to put on and taking off regular lower body clothing?: None 6 Click Score: 24   End of  Session Equipment Utilized During Treatment: Back brace Nurse Communication: Mobility status  Activity Tolerance: Patient tolerated treatment well Patient left: in chair;with call bell/phone within reach  OT Visit Diagnosis: Unsteadiness on feet (R26.81);Pain Pain - part of body: (back)                Time: [id number] OT Time Calculation (min): 11 min Charges:  OT General Charges $OT Visit: 1 Visit OT Evaluation $OT Eval Low Complexity: 1 Low  Jolaine Artist, OT Acute Rehabilitation Services Pager [phone] Office [phone]   Delight Stare 06/30/2019, 10:11 AM

## 2019-06-30 NOTE — Evaluation (Signed)
Physical Therapy Evaluation Patient Details Name: Sharon Logan MRN: [id number] DOB: [birth year] Today's Date: 06/30/2019   History of Present Illness  64yo female s/p revision of L3-L4 and L5-S1 posterior fusion with instrumentation and allograft done 06/29/19. PMH fibromyalgia, chronic pain, CA, OA, hx back surgery  Clinical Impression   Patient received up in chair with TLSO applied, very pleasant and willing to work with therapy, eager to participate in mobility this morning. Verbally reviewed applicable precautions, then able to complete functional transfers and gait approximately 332f with no device and supervision for safety. Required cues to avoid twisting during dynamic situations, also provided education on appropriate footwear and benefits of having daughter stay with her after surgery. She was left up in the chair with all needs met this morning, all questions/concerns addressed. Currently recommending skilled OP PT services once cleared for participation by surgeon.     Follow Up Recommendations Outpatient PT;Other (comment)(once cleared by surgeon)    Equipment Recommendations  3in1 (PT)    Recommendations for Other Services       Precautions / Restrictions Precautions Precautions: Back Precaution Booklet Issued: Yes (comment) Precaution Comments: paper copy of precautions provided, patient had reviewed with OT and has no questions, able to list appropriate precautions Required Braces or Orthoses: Spinal Brace Spinal Brace: Thoracolumbosacral orthotic;Other (comment) Spinal Brace Comments: applied in sitting or standing per order set Restrictions Weight Bearing Restrictions: No Other Position/Activity Restrictions: no bending/arching/twisting      Mobility  Bed Mobility               General bed mobility comments: OOB in chair  Transfers Overall transfer level: Needs assistance Equipment used: None Transfers: Sit to/from Stand Sit to Stand: Supervision          General transfer comment: S for safety, no physical assist given  Ambulation/Gait Ambulation/Gait assistance: Supervision Gait Distance (Feet): 300 Feet Assistive device: None Gait Pattern/deviations: Step-through pattern;Trendelenburg Gait velocity: functional   General Gait Details: gait pattern generally WNL, did note some functional weakness in hip stabilizers, cues to avoid twisting to interact with environment during gait  Stairs            Wheelchair Mobility    Modified Rankin (Stroke Patients Only)       Balance Overall balance assessment: Mild deficits observed, not formally tested                                           Pertinent Vitals/Pain Pain Assessment: Faces Faces Pain Scale: Hurts a little bit Pain Location: generalized aching and soreness Pain Descriptors / Indicators: Aching;Sore;Guarding Pain Intervention(s): Limited activity within patient's tolerance;Monitored during session    Home Living Family/patient expects to be discharged to:: Private residence Living Arrangements: Alone Available Help at Discharge: Family;Available PRN/intermittently(daughter will be there for the first couple days, may be able to stay longer if needed) Type of Home: House(townhome) Home Access: Level entry     Home Layout: One level Home Equipment: None      Prior Function Level of Independence: Independent               Hand Dominance        Extremity/Trunk Assessment   Upper Extremity Assessment Upper Extremity Assessment: Defer to OT evaluation    Lower Extremity Assessment Lower Extremity Assessment: Generalized weakness    Cervical / Trunk Assessment Cervical / Trunk  Assessment: Other exceptions Cervical / Trunk Exceptions: s/p back surgery  Communication   Communication: No difficulties  Cognition Arousal/Alertness: Awake/alert Behavior During Therapy: WFL for tasks assessed/performed Overall Cognitive  Status: Within Functional Limits for tasks assessed                                 General Comments: very pleasant and cooperative, eager to participate in mobility      General Comments      Exercises     Assessment/Plan    PT Assessment Patient needs continued PT services  PT Problem List Decreased strength;Decreased safety awareness;Decreased knowledge of precautions;Pain       PT Treatment Interventions Balance training;Gait training;Functional mobility training;Patient/family education;Therapeutic activities;Therapeutic exercise    PT Goals (Current goals can be found in the Care Plan section)  Acute Rehab PT Goals Patient Stated Goal: go home, keep feeling better PT Goal Formulation: With patient Time For Goal Achievement: 07/14/19 Potential to Achieve Goals: Good    Frequency Min 5X/week   Barriers to discharge        Co-evaluation               AM-PAC PT "6 Clicks" Mobility  Outcome Measure Help needed turning from your back to your side while in a flat bed without using bedrails?: None Help needed moving from lying on your back to sitting on the side of a flat bed without using bedrails?: A Little Help needed moving to and from a bed to a chair (including a wheelchair)?: A Little Help needed standing up from a chair using your arms (e.g., wheelchair or bedside chair)?: A Little Help needed to walk in hospital room?: A Little Help needed climbing 3-5 steps with a railing? : A Little 6 Click Score: 19    End of Session Equipment Utilized During Treatment: Back brace Activity Tolerance: Patient tolerated treatment well Patient left: in chair;with call bell/phone within reach Nurse Communication: Mobility status PT Visit Diagnosis: Pain;Muscle weakness (generalized) (M62.81) Pain - Right/Left: (bilateral) Pain - part of body: (back)    Time: 8144-8185 PT Time Calculation (min) (ACUTE ONLY): 16 min   Charges:   PT Evaluation $PT  Eval Low Complexity: 1 Low          Windell Norfolk, DPT, PN1   Supplemental Physical Therapist [place]    Pager [phone] Acute Rehab Office [phone]

## 2019-06-30 NOTE — Progress Notes (Signed)
    Patient doing well postop day 1 status post revision L3-4  and L5-S1 fusion with instrumentation spanning L3-S1 with pelvic fixation extension. She reports minimal well-controlled low back pain.  She denies any leg symptomatology.  She could not be more pleased with her progress and outcomes.  She has been up and walking.  She is eating normally and has normal bowel and bladder function.   Physical Exam: Vitals:   06/30/19 0430 06/30/19 0812  BP: (!) 103/56 96/63  Pulse: 74 84  Resp: 20 16  Temp: 98.1 F (36.7 C) 98.1 F (36.7 C)  SpO2: 96% 97%    Dressing in place, clean dry and intact.  Brace in place worn appropriately.  Patient appears comfortable sitting up on the edge of the bed. NVI  POD #1 s/p revision fusion with instrumentation spanning L3 through the pelvis, doing very well with minimal well-controlled pain  - up with PT/OT, encourage ambulation - Percocet for pain, Robaxin for muscle spasms  - pain medication sent to pharmacy for discharge - likely d/c home today with f/u in 2 weeks

## 2019-07-12 DIAGNOSIS — Z9889 Other specified postprocedural states: Secondary | ICD-10-CM | POA: Diagnosis not present

## 2019-07-13 DIAGNOSIS — F3341 Major depressive disorder, recurrent, in partial remission: Secondary | ICD-10-CM | POA: Diagnosis not present

## 2019-07-13 DIAGNOSIS — M960 Pseudarthrosis after fusion or arthrodesis: Secondary | ICD-10-CM | POA: Diagnosis not present

## 2019-07-13 DIAGNOSIS — M545 Low back pain: Secondary | ICD-10-CM | POA: Diagnosis not present

## 2019-07-13 DIAGNOSIS — Z981 Arthrodesis status: Secondary | ICD-10-CM | POA: Diagnosis not present

## 2019-07-13 DIAGNOSIS — E7800 Pure hypercholesterolemia, unspecified: Secondary | ICD-10-CM | POA: Diagnosis not present

## 2019-07-13 DIAGNOSIS — M2559 Pain in other specified joint: Secondary | ICD-10-CM | POA: Diagnosis not present

## 2019-07-13 DIAGNOSIS — G43009 Migraine without aura, not intractable, without status migrainosus: Secondary | ICD-10-CM | POA: Diagnosis not present

## 2019-07-13 DIAGNOSIS — R209 Unspecified disturbances of skin sensation: Secondary | ICD-10-CM | POA: Diagnosis not present

## 2019-07-20 NOTE — Discharge Summary (Signed)
Patient ID: Sharon Logan MRN: [id number] DOB/AGE: 1956-05-01 64 y.o.  Admit date: 06/29/2019 Discharge date: 06/30/2019  Admission Diagnoses:  Active Problems:   Radiculopathy   Discharge Diagnoses:  Same  Past Medical History:  Diagnosis Date  . Allergic rhinitis   . Anxiety   . Arthritis   . Cancer ([facility])    skin  . Chronic pain   . Chronically dry eyes   . Depression   . Fibromyalgia   . Headache   . Vitamin D deficiency   . Wears glasses     Surgeries: Procedure(s): REVISION POSTERIOR SPINAL FUSION LUMBAR 3-4, LUMBAR 5 - SACRUM 1 WITH INSTRUMENTATION AND ALLOGRAFT AND BONE MORPHOGENIC PROTEIN on 06/29/2019   Consultants: None  Discharged Condition: Improved  Hospital Course: Sharon Logan is an 64 y.o. female who was admitted 06/29/2019 for operative treatment of radiculopathy. Patient has severe unremitting pain that affects sleep, daily activities, and work/hobbies. After pre-op clearance the patient was taken to the operating room on 06/29/2019 and underwent  Procedure(s): REVISION POSTERIOR SPINAL FUSION LUMBAR 3-4, LUMBAR 5 - SACRUM 1 WITH INSTRUMENTATION AND ALLOGRAFT AND BONE MORPHOGENIC PROTEIN.    Patient was given perioperative antibiotics:  Anti-infectives (From admission, onward)   Start     Dose/Rate Route Frequency Ordered Stop   06/29/19 1545  ceFAZolin (ANCEF) IVPB 2g/100 mL premix     2 g 200 mL/hr over 30 Minutes Intravenous Every 8 hours 06/29/19 1538 06/29/19 2259   06/29/19 0601  ceFAZolin (ANCEF) 2-4 GM/100ML-% IVPB    Note to Pharmacy: Tamsen Snider   : cabinet override      06/29/19 0601 06/29/19 0744   06/29/19 0600  ceFAZolin (ANCEF) IVPB 2g/100 mL premix     2 g 200 mL/hr over 30 Minutes Intravenous On call to O.R. 06/29/19 8315 06/29/19 0743       Patient was given sequential compression devices, early ambulation to prevent DVT.  Patient benefited maximally from hospital stay and there were no complications.    Recent vital  signs: BP 96/63 (BP Location: Right Arm)   Pulse 84   Temp 98.1 F (36.7 C) (Oral)   Resp 16   Ht '5\' 4"'  (1.626 m)   Wt 65.3 kg   SpO2 97%   BMI 24.72 kg/m   Discharge Medications:   Allergies as of 06/30/2019      Reactions   Gabapentin Other (See Comments)   Polymyxin B Other (See Comments)   Eyes go blood red   Cymbalta [duloxetine Hcl] Other (See Comments)   Headaches, constipation   Duloxetine Other (See Comments)   HEADACHES CONSTIPATION   Lamotrigine Rash   Other Other (See Comments)   OTOBIOTIC > RED EYES   Pregabalin Rash   Itchy red rash on chest   Prozac [fluoxetine] Other (See Comments)   CONSTIPATION   Zolpidem Rash   "sleep walk"      Medication List    TAKE these medications   diclofenac sodium 1 % Gel Commonly known as: VOLTAREN Apply 2 g topically 4 (four) times daily. Rub into affected area of foot 2 to 4 times daily   eletriptan 40 MG tablet Commonly known as: RELPAX Take 40 mg by mouth as needed for migraine or headache. May repeat in 2 hours if headache persists or recurs.   escitalopram 20 MG tablet Commonly known as: Lexapro Take 1 tablet (20 mg total) by mouth daily.   Florastor 250 MG capsule Generic drug: saccharomyces  boulardii Take 500 mg by mouth daily.   fluticasone 50 MCG/ACT nasal spray Commonly known as: FLONASE Place 2 sprays into both nostrils 2 (two) times daily as needed for allergies or rhinitis.   Lidocaine 1.8 % Ptch Apply 1 patch topically daily.   methocarbamol 500 MG tablet Commonly known as: ROBAXIN Take 1 tablet (500 mg total) by mouth every 6 (six) hours as needed for muscle spasms.   Narcan 4 MG/0.1ML Liqd nasal spray kit Generic drug: naloxone Place 1 spray into the nose once. Repeat every 3 minutes as needed if no or minimal response.   nortriptyline 10 MG capsule Commonly known as: PAMELOR Take 30 mg by mouth at bedtime.   olopatadine 0.1 % ophthalmic solution Commonly known as: PATANOL Place 1  drop into both eyes 2 (two) times daily as needed for allergies.   OVER THE COUNTER MEDICATION Take 2 capsules by mouth 2 (two) times daily as needed (allergies). Sinus Support EF   oxyCODONE-acetaminophen 5-325 MG tablet Commonly known as: PERCOCET/ROXICET Take 1-2 tablets by mouth every 4 (four) hours as needed for moderate pain or severe pain.   rizatriptan 10 MG tablet Commonly known as: MAXALT Take 10 mg by mouth daily as needed for migraine.   THERATEARS OP Apply 1 drop to eye daily as needed (dry eyes).   VITAMIN D PO Take 4 capsules by mouth daily.       Diagnostic Studies: DG Lumbar Spine 2-3 Views  Result Date: 06/29/2019 CLINICAL DATA:  L3-4 and L4-5 revision. EXAM: LUMBAR SPINE - 2-3 VIEW; DG C-ARM 1-60 MIN COMPARISON:  07/22/2016. 08/21/2016. FINDINGS: Interbody and pedicle screw and rod fusion at the L3 through S2 levels with long bilateral sacral screws bridging the sacroiliac joints. Normal alignment on these views. IMPRESSION: Hardware fusion at the L3 through S2 levels. Electronically Signed   By: Claudie Revering M.D.   On: 06/29/2019 13:20   DG C-Arm 1-60 Min  Result Date: 06/29/2019 CLINICAL DATA:  L3-4 and L4-5 revision. EXAM: LUMBAR SPINE - 2-3 VIEW; DG C-ARM 1-60 MIN COMPARISON:  07/22/2016. 08/21/2016. FINDINGS: Interbody and pedicle screw and rod fusion at the L3 through S2 levels with long bilateral sacral screws bridging the sacroiliac joints. Normal alignment on these views. IMPRESSION: Hardware fusion at the L3 through S2 levels. Electronically Signed   By: Claudie Revering M.D.   On: 06/29/2019 13:20    Disposition: Discharge disposition: 01-Home or Self Care        POD #1 s/p revision fusion with instrumentation spanning L3 through the pelvis, doing very well with minimal well-controlled pain  - up with PT/OT, encourage ambulation - Percocet for pain, Robaxin for muscle spasms             - pain medication sent to pharmacy for discharge - d/c home  today with f/u in 2 weeks  Signed: Lennie Muckle Akari Crysler 07/20/2019, 11:16 AM

## 2019-07-25 DIAGNOSIS — M150 Primary generalized (osteo)arthritis: Secondary | ICD-10-CM | POA: Diagnosis not present

## 2019-07-25 DIAGNOSIS — M797 Fibromyalgia: Secondary | ICD-10-CM | POA: Diagnosis not present

## 2019-07-25 DIAGNOSIS — E663 Overweight: Secondary | ICD-10-CM | POA: Diagnosis not present

## 2019-07-25 DIAGNOSIS — M2550 Pain in unspecified joint: Secondary | ICD-10-CM | POA: Diagnosis not present

## 2019-07-25 DIAGNOSIS — R5383 Other fatigue: Secondary | ICD-10-CM | POA: Diagnosis not present

## 2019-07-25 DIAGNOSIS — Z6825 Body mass index (BMI) 25.0-25.9, adult: Secondary | ICD-10-CM | POA: Diagnosis not present

## 2019-07-26 ENCOUNTER — Other Ambulatory Visit: Payer: Self-pay

## 2019-07-26 ENCOUNTER — Other Ambulatory Visit (HOSPITAL_COMMUNITY): Payer: Self-pay | Admitting: Orthopedic Surgery

## 2019-07-26 ENCOUNTER — Ambulatory Visit (HOSPITAL_COMMUNITY)
Admission: RE | Admit: 2019-07-26 | Discharge: 2019-07-26 | Disposition: A | Discharge status: Home / Self Care | Payer: PPO | Source: Ambulatory Visit | Attending: Orthopedic Surgery | Admitting: Orthopedic Surgery

## 2019-07-26 DIAGNOSIS — R600 Localized edema: Secondary | ICD-10-CM

## 2019-07-26 NOTE — Progress Notes (Signed)
Bilateral lower extremity venous duplex exam performed.  Preliminary results can be found under CV proc under chart review.  07/26/2019 4:22 PM  Evelyn Aguinaldo, K., RDMS, RVT

## 2019-08-07 ENCOUNTER — Other Ambulatory Visit: Payer: Self-pay

## 2019-08-07 ENCOUNTER — Ambulatory Visit (INDEPENDENT_AMBULATORY_CARE_PROVIDER_SITE_OTHER): Payer: PPO | Admitting: Podiatry

## 2019-08-07 ENCOUNTER — Encounter: Payer: Self-pay | Admitting: Podiatry

## 2019-08-07 ENCOUNTER — Ambulatory Visit (INDEPENDENT_AMBULATORY_CARE_PROVIDER_SITE_OTHER): Payer: PPO

## 2019-08-07 DIAGNOSIS — M205X9 Other deformities of toe(s) (acquired), unspecified foot: Secondary | ICD-10-CM

## 2019-08-07 DIAGNOSIS — M778 Other enthesopathies, not elsewhere classified: Secondary | ICD-10-CM | POA: Diagnosis not present

## 2019-08-07 DIAGNOSIS — M2141 Flat foot [pes planus] (acquired), right foot: Secondary | ICD-10-CM | POA: Diagnosis not present

## 2019-08-07 DIAGNOSIS — M2142 Flat foot [pes planus] (acquired), left foot: Secondary | ICD-10-CM

## 2019-08-07 MED ORDER — MELOXICAM 15 MG PO TABS: 15.0000 mg | ORAL_TABLET | Freq: Every day | ORAL | 0 refills | Status: DC

## 2019-08-07 NOTE — Patient Instructions (Signed)
Hold off on the Celebrex while on the mobic.   Meloxicam tablets What is this medicine? MELOXICAM (mel OX i cam) is a non-steroidal anti-inflammatory drug (NSAID). It is used to reduce swelling and to treat pain. It may be used for osteoarthritis, rheumatoid arthritis, or juvenile rheumatoid arthritis. This medicine may be used for other purposes; ask your health care provider or pharmacist if you have questions. COMMON BRAND NAME(S): Mobic What should I tell my health care provider before I take this medicine? They need to know if you have any of these conditions:  bleeding disorders  cigarette smoker  coronary artery bypass graft (CABG) surgery within the past 2 weeks  drink more than 3 alcohol-containing drinks per day  heart disease  high blood pressure  history of stomach bleeding  kidney disease  liver disease  lung or breathing disease, like asthma  stomach or intestine problems  an unusual or allergic reaction to meloxicam, aspirin, other NSAIDs, other medicines, foods, dyes, or preservatives  pregnant or trying to get pregnant  breast-feeding How should I use this medicine? Take this medicine by mouth with a full glass of water. Follow the directions on the prescription label. You can take it with or without food. If it upsets your stomach, take it with food. Take your medicine at regular intervals. Do not take it more often than directed. Do not stop taking except on your doctor's advice. A special MedGuide will be given to you by the pharmacist with each prescription and refill. Be sure to read this information carefully each time. Talk to your pediatrician regarding the use of this medicine in children. While this drug may be prescribed for selected conditions, precautions do apply. Patients over 41 years old may have a stronger reaction and need a smaller dose. Overdosage: If you think you have taken too much of this medicine contact a poison control center or  emergency room at once. NOTE: This medicine is only for you. Do not share this medicine with others. What if I miss a dose? If you miss a dose, take it as soon as you can. If it is almost time for your next dose, take only that dose. Do not take double or extra doses. What may interact with this medicine? Do not take this medicine with any of the following medications:  cidofovir  ketorolac This medicine may also interact with the following medications:  aspirin and aspirin-like medicines  certain medicines for blood pressure, heart disease, irregular heart beat  certain medicines for depression, anxiety, or psychotic disturbances  certain medicines that treat or prevent blood clots like warfarin, enoxaparin, dalteparin, apixaban, dabigatran, rivaroxaban  cyclosporine  diuretics  fluconazole  lithium  methotrexate  other NSAIDs, medicines for pain and inflammation, like ibuprofen and naproxen  pemetrexed This list may not describe all possible interactions. Give your health care provider a list of all the medicines, herbs, non-prescription drugs, or dietary supplements you use. Also tell them if you smoke, drink alcohol, or use illegal drugs. Some items may interact with your medicine. What should I watch for while using this medicine? Tell your doctor or healthcare provider if your symptoms do not start to get better or if they get worse. This medicine may cause serious skin reactions. They can happen weeks to months after starting the medicine. Contact your healthcare provider right away if you notice fevers or flu-like symptoms with a rash. The rash may be red or purple and then turn into blisters or peeling  of the skin. Or, you might notice a red rash with swelling of the face, lips or lymph nodes in your neck or under your arms. Do not take other medicines that contain aspirin, ibuprofen, or naproxen with this medicine. Side effects such as stomach upset, nausea, or ulcers  may be more likely to occur. Many medicines available without a prescription should not be taken with this medicine. This medicine can cause ulcers and bleeding in the stomach and intestines at any time during treatment. This can happen with no warning and may cause death. There is increased risk with taking this medicine for a long time. Smoking, drinking alcohol, older age, and poor health can also increase risks. Call your doctor right away if you have stomach pain or blood in your vomit or stool. This medicine does not prevent heart attack or stroke. In fact, this medicine may increase the chance of a heart attack or stroke. The chance may increase with longer use of this medicine and in people who have heart disease. If you take aspirin to prevent heart attack or stroke, talk with your doctor or healthcare provider. What side effects may I notice from receiving this medicine? Side effects that you should report to your doctor or health care professional as soon as possible:  allergic reactions like skin rash, itching or hives, swelling of the face, lips, or tongue  nausea, vomiting  redness, blistering, peeling, or loosening of the skin, including inside the mouth  signs and symptoms of a blood clot such as breathing problems; changes in vision; chest pain; severe, sudden headache; pain, swelling, warmth in the leg; trouble speaking; sudden numbness or weakness of the face, arm, or leg  signs and symptoms of bleeding such as bloody or black, tarry stools; red or dark-brown urine; spitting up blood or brown material that looks like coffee grounds; red spots on the skin; unusual bruising or bleeding from the eye, gums, or nose  signs and symptoms of liver injury like dark yellow or brown urine; general ill feeling or flu-like symptoms; light-colored stools; loss of appetite; nausea; right upper belly pain; unusually weak or tired; yellowing of the eyes or skin  signs and symptoms of stroke like  changes in vision; confusion; trouble speaking or understanding; severe headaches; sudden numbness or weakness of the face, arm, or leg; trouble walking; dizziness; loss of balance or coordination Side effects that usually do not require medical attention (report to your doctor or health care professional if they continue or are bothersome):  constipation  diarrhea  gas This list may not describe all possible side effects. Call your doctor for medical advice about side effects. You may report side effects to FDA at 1-800-FDA-1088. Where should I keep my medicine? Keep out of the reach of children. Store at room temperature between 15 and 30 degrees C (59 and 86 degrees F). Throw away any unused medicine after the expiration date. NOTE: This sheet is a summary. It may not cover all possible information. If you have questions about this medicine, talk to your doctor, pharmacist, or health care provider.  2020 Elsevier/Gold Standard (2018-08-31 11:21:28)

## 2019-08-11 DIAGNOSIS — Z9889 Other specified postprocedural states: Secondary | ICD-10-CM | POA: Diagnosis not present

## 2019-08-14 DIAGNOSIS — M79604 Pain in right leg: Secondary | ICD-10-CM | POA: Diagnosis not present

## 2019-08-20 NOTE — Progress Notes (Signed)
Subjective: 64 year old female presents the office today for concerns of pain to the right foot which she states started off in the big toe joint, punctate area and has moved to other areas of the foot.  She has been taking Celebrex for this has not been helpful.  She denies any recent injury.  Denies any increase in swelling or redness. Denies any systemic complaints such as fevers, chills, nausea, vomiting. No acute changes since last appointment, and no other complaints at this time.   Objective: AAO x3, NAD DP/PT pulses palpable bilaterally, CRT less than 3 seconds Decreased range of motion of first MPJ particularly dorsiflexion.  There is minimal edema of the first MPJ but there is no erythema or warmth.  Tenderness submetatarsal 2.  No other areas of pinpoint tenderness identified.  Scar from prior bunion surgery well intact.  No open lesions or pre-ulcerative lesions.  No pain with calf compression, swelling, warmth, erythema  Assessment: Capsulitis, metatarsalgia right foot  Plan: -All treatment options discussed with the patient including all alternatives, risks, complications.  -X-rays obtained and reviewed.  Arthritic changes present the first MPJ.  No evidence of acute fracture.  Hardware intact from prior bunion surgery. -We will hold off on Celebrex and start meloxicam.  Discussed steroid injection today.  Discussed shoe modifications and orthotics. -Patient encouraged to call the office with any questions, concerns, change in symptoms.   Return in about 4 weeks (around 09/04/2019).   Trula Slade DPM

## 2019-08-30 DIAGNOSIS — M2559 Pain in other specified joint: Secondary | ICD-10-CM | POA: Diagnosis not present

## 2019-08-30 DIAGNOSIS — M79606 Pain in leg, unspecified: Secondary | ICD-10-CM | POA: Diagnosis not present

## 2019-09-04 ENCOUNTER — Other Ambulatory Visit: Payer: Self-pay

## 2019-09-04 ENCOUNTER — Encounter: Payer: Self-pay | Admitting: Podiatry

## 2019-09-04 ENCOUNTER — Ambulatory Visit (INDEPENDENT_AMBULATORY_CARE_PROVIDER_SITE_OTHER): Payer: PPO | Admitting: Podiatry

## 2019-09-04 VITALS — Temp 98.3°F

## 2019-09-04 DIAGNOSIS — M205X9 Other deformities of toe(s) (acquired), unspecified foot: Secondary | ICD-10-CM | POA: Diagnosis not present

## 2019-09-04 DIAGNOSIS — M778 Other enthesopathies, not elsewhere classified: Secondary | ICD-10-CM | POA: Diagnosis not present

## 2019-09-04 DIAGNOSIS — M779 Enthesopathy, unspecified: Secondary | ICD-10-CM

## 2019-09-04 DIAGNOSIS — M2142 Flat foot [pes planus] (acquired), left foot: Secondary | ICD-10-CM

## 2019-09-04 DIAGNOSIS — M2141 Flat foot [pes planus] (acquired), right foot: Secondary | ICD-10-CM

## 2019-09-07 DIAGNOSIS — Z1159 Encounter for screening for other viral diseases: Secondary | ICD-10-CM | POA: Diagnosis not present

## 2019-09-07 DIAGNOSIS — M129 Arthropathy, unspecified: Secondary | ICD-10-CM | POA: Diagnosis not present

## 2019-09-07 DIAGNOSIS — Z79899 Other long term (current) drug therapy: Secondary | ICD-10-CM | POA: Diagnosis not present

## 2019-09-07 DIAGNOSIS — M797 Fibromyalgia: Secondary | ICD-10-CM | POA: Diagnosis not present

## 2019-09-08 DIAGNOSIS — Z9889 Other specified postprocedural states: Secondary | ICD-10-CM | POA: Diagnosis not present

## 2019-09-10 NOTE — Progress Notes (Signed)
Subjective: 64 year old female presents the office today for follow-up evaluation of right foot pain.  She still gets pain to the top of the big toe joint along the big toe joint itself.  No recent injury since her last saw her.  She does not feel the orthotics are fitting properly. Denies any systemic complaints such as fevers, chills, nausea, vomiting. No acute changes since last appointment, and no other complaints at this time.   Objective: AAO x3, NAD DP/PT pulses palpable bilaterally, CRT less than 3 seconds Tenderness along the right first MPJ and mild discomfort MPJ range of motion.  There is no crepitation with range of motion.  Flatfoot present there is minimal edema.  No erythema or warmth. No open lesions or pre-ulcerative lesions.  No pain with calf compression, swelling, warmth, erythema  Assessment: Capsulitis right first MTPJ   Plan: -All treatment options discussed with the patient including all alternatives, risks, complications.  -Steroid injection performed.  Skin was cleaned with Betadine and then a mixture of 1 cc Kenalog 10, 0.5 cc of Marcaine plain, 0.5 cc of lidocaine plain was infiltrated into around the first MPJ without complications.  Postinjection care was discussed.  Ultimately discussed surgical intervention.  She will consider this later this year.  We discussed with her essentially cheilectomy with hardware removal and release of first MPJ. Rick modified inserts  Trula Slade DPM

## 2019-09-13 DIAGNOSIS — M6281 Muscle weakness (generalized): Secondary | ICD-10-CM | POA: Diagnosis not present

## 2019-09-13 DIAGNOSIS — M4327 Fusion of spine, lumbosacral region: Secondary | ICD-10-CM | POA: Diagnosis not present

## 2019-09-18 DIAGNOSIS — M4327 Fusion of spine, lumbosacral region: Secondary | ICD-10-CM | POA: Diagnosis not present

## 2019-09-18 DIAGNOSIS — M6281 Muscle weakness (generalized): Secondary | ICD-10-CM | POA: Diagnosis not present

## 2019-09-20 DIAGNOSIS — M79604 Pain in right leg: Secondary | ICD-10-CM | POA: Diagnosis not present

## 2019-09-20 DIAGNOSIS — M48061 Spinal stenosis, lumbar region without neurogenic claudication: Secondary | ICD-10-CM | POA: Diagnosis not present

## 2019-09-20 DIAGNOSIS — M79605 Pain in left leg: Secondary | ICD-10-CM | POA: Diagnosis not present

## 2019-09-20 DIAGNOSIS — M19079 Primary osteoarthritis, unspecified ankle and foot: Secondary | ICD-10-CM | POA: Diagnosis not present

## 2019-09-20 DIAGNOSIS — M797 Fibromyalgia: Secondary | ICD-10-CM | POA: Diagnosis not present

## 2019-09-21 DIAGNOSIS — M4327 Fusion of spine, lumbosacral region: Secondary | ICD-10-CM | POA: Diagnosis not present

## 2019-09-21 DIAGNOSIS — M6281 Muscle weakness (generalized): Secondary | ICD-10-CM | POA: Diagnosis not present

## 2019-09-25 DIAGNOSIS — M4327 Fusion of spine, lumbosacral region: Secondary | ICD-10-CM | POA: Diagnosis not present

## 2019-09-25 DIAGNOSIS — M6281 Muscle weakness (generalized): Secondary | ICD-10-CM | POA: Diagnosis not present

## 2019-09-29 DIAGNOSIS — M79604 Pain in right leg: Secondary | ICD-10-CM | POA: Diagnosis not present

## 2019-09-29 DIAGNOSIS — M79605 Pain in left leg: Secondary | ICD-10-CM | POA: Diagnosis not present

## 2019-09-29 DIAGNOSIS — M797 Fibromyalgia: Secondary | ICD-10-CM | POA: Diagnosis not present

## 2019-09-29 DIAGNOSIS — M6281 Muscle weakness (generalized): Secondary | ICD-10-CM | POA: Diagnosis not present

## 2019-09-29 DIAGNOSIS — M4327 Fusion of spine, lumbosacral region: Secondary | ICD-10-CM | POA: Diagnosis not present

## 2019-09-29 DIAGNOSIS — R5383 Other fatigue: Secondary | ICD-10-CM | POA: Diagnosis not present

## 2019-10-02 DIAGNOSIS — M6281 Muscle weakness (generalized): Secondary | ICD-10-CM | POA: Diagnosis not present

## 2019-10-02 DIAGNOSIS — M4327 Fusion of spine, lumbosacral region: Secondary | ICD-10-CM | POA: Diagnosis not present

## 2019-10-06 DIAGNOSIS — M6281 Muscle weakness (generalized): Secondary | ICD-10-CM | POA: Diagnosis not present

## 2019-10-06 DIAGNOSIS — M4327 Fusion of spine, lumbosacral region: Secondary | ICD-10-CM | POA: Diagnosis not present

## 2019-10-06 DIAGNOSIS — M19079 Primary osteoarthritis, unspecified ankle and foot: Secondary | ICD-10-CM | POA: Diagnosis not present

## 2019-10-06 DIAGNOSIS — M85851 Other specified disorders of bone density and structure, right thigh: Secondary | ICD-10-CM | POA: Diagnosis not present

## 2019-10-06 DIAGNOSIS — F3341 Major depressive disorder, recurrent, in partial remission: Secondary | ICD-10-CM | POA: Diagnosis not present

## 2019-10-06 DIAGNOSIS — M797 Fibromyalgia: Secondary | ICD-10-CM | POA: Diagnosis not present

## 2019-10-09 DIAGNOSIS — M6281 Muscle weakness (generalized): Secondary | ICD-10-CM | POA: Diagnosis not present

## 2019-10-09 DIAGNOSIS — M4327 Fusion of spine, lumbosacral region: Secondary | ICD-10-CM | POA: Diagnosis not present

## 2019-10-13 DIAGNOSIS — M4327 Fusion of spine, lumbosacral region: Secondary | ICD-10-CM | POA: Diagnosis not present

## 2019-10-13 DIAGNOSIS — M6281 Muscle weakness (generalized): Secondary | ICD-10-CM | POA: Diagnosis not present

## 2019-10-16 DIAGNOSIS — M6281 Muscle weakness (generalized): Secondary | ICD-10-CM | POA: Diagnosis not present

## 2019-10-16 DIAGNOSIS — M4327 Fusion of spine, lumbosacral region: Secondary | ICD-10-CM | POA: Diagnosis not present

## 2019-10-20 DIAGNOSIS — M6281 Muscle weakness (generalized): Secondary | ICD-10-CM | POA: Diagnosis not present

## 2019-10-20 DIAGNOSIS — M4327 Fusion of spine, lumbosacral region: Secondary | ICD-10-CM | POA: Diagnosis not present

## 2019-10-23 DIAGNOSIS — M4327 Fusion of spine, lumbosacral region: Secondary | ICD-10-CM | POA: Diagnosis not present

## 2019-10-23 DIAGNOSIS — M6281 Muscle weakness (generalized): Secondary | ICD-10-CM | POA: Diagnosis not present

## 2019-10-27 DIAGNOSIS — M6281 Muscle weakness (generalized): Secondary | ICD-10-CM | POA: Diagnosis not present

## 2019-10-27 DIAGNOSIS — M4327 Fusion of spine, lumbosacral region: Secondary | ICD-10-CM | POA: Diagnosis not present

## 2019-10-31 ENCOUNTER — Other Ambulatory Visit: Payer: Self-pay

## 2019-10-31 ENCOUNTER — Ambulatory Visit (INDEPENDENT_AMBULATORY_CARE_PROVIDER_SITE_OTHER): Payer: PPO

## 2019-10-31 ENCOUNTER — Ambulatory Visit (INDEPENDENT_AMBULATORY_CARE_PROVIDER_SITE_OTHER): Payer: PPO | Admitting: Podiatry

## 2019-10-31 ENCOUNTER — Encounter: Payer: Self-pay | Admitting: Podiatry

## 2019-10-31 DIAGNOSIS — M205X1 Other deformities of toe(s) (acquired), right foot: Secondary | ICD-10-CM

## 2019-10-31 DIAGNOSIS — M2142 Flat foot [pes planus] (acquired), left foot: Secondary | ICD-10-CM | POA: Diagnosis not present

## 2019-10-31 DIAGNOSIS — M7751 Other enthesopathy of right foot: Secondary | ICD-10-CM

## 2019-10-31 DIAGNOSIS — M2141 Flat foot [pes planus] (acquired), right foot: Secondary | ICD-10-CM

## 2019-10-31 DIAGNOSIS — F4310 Post-traumatic stress disorder, unspecified: Secondary | ICD-10-CM | POA: Diagnosis not present

## 2019-10-31 DIAGNOSIS — M205X9 Other deformities of toe(s) (acquired), unspecified foot: Secondary | ICD-10-CM

## 2019-10-31 DIAGNOSIS — M779 Enthesopathy, unspecified: Secondary | ICD-10-CM

## 2019-10-31 NOTE — Patient Instructions (Signed)

## 2019-11-06 ENCOUNTER — Ambulatory Visit: Payer: Federal, State, Local not specified - PPO | Admitting: Podiatry

## 2019-11-06 DIAGNOSIS — M85851 Other specified disorders of bone density and structure, right thigh: Secondary | ICD-10-CM | POA: Diagnosis not present

## 2019-11-06 DIAGNOSIS — R140 Abdominal distension (gaseous): Secondary | ICD-10-CM | POA: Diagnosis not present

## 2019-11-06 DIAGNOSIS — M797 Fibromyalgia: Secondary | ICD-10-CM | POA: Diagnosis not present

## 2019-11-06 DIAGNOSIS — M48061 Spinal stenosis, lumbar region without neurogenic claudication: Secondary | ICD-10-CM | POA: Diagnosis not present

## 2019-11-07 NOTE — Progress Notes (Signed)
Subjective: 64 year old female presents the office today for follow-up evaluation of right big toe joint pain. The injection was helpful and she is asking another one today. She also presents today and wants to discuss surgical intervention as she is getting pain to the big toe joint which is limiting her activity. She is tried shoe modifications, orthotics, injections but insignificant resolution. Denies any systemic complaints such as fevers, chills, nausea, vomiting. No acute changes since last appointment, and no other complaints at this time.   Objective: AAO x3, NAD DP/PT pulses palpable bilaterally, CRT less than 3 seconds There is tenderness palpation on the first MPJ of the right foot. There is crepitation today with MPJ range of motion although range of motion appears to be intact. There are no other areas of discomfort identified at this time. Flatfoot present.  No open lesions or pre-ulcerative lesions.  No pain with calf compression, swelling, warmth, erythema  Assessment: Right first MPJ capsulitis; hallux limitus  Plan: -All treatment options discussed with the patient including all alternatives, risks, complications.  -Repeat x-rays obtained reviewed. Hardware intact. Adaptive changes present of the first MPJ. There is no evidence of acute fracture identified today. -Steroid injection performed today. See procedure note below. -We discussed both conservative as well as surgical intervention. She was to proceed with surgery at this point as she is attempted conservative care without any significant resolution. I discussed with her possibly cheilectomy versus MPJ release versus first MPJ arthroplasty with implant. Will determine is intraoperatively depending on the cartilage but most likely will need to have the first MPJ arthroplasty with implant. -The incision placement as well as the postoperative course was discussed with the patient. I discussed risks of the surgery which  include, but not limited to, infection, bleeding, pain, swelling, need for further surgery, delayed or nonhealing, painful or ugly scar, numbness or sensation changes, over/under correction, recurrence, transfer lesions, further deformity, hardware failure, DVT/PE, loss of toe/foot. Patient understands these risks and wishes to proceed with surgery. The surgical consent was reviewed with the patient all 3 pages were signed. No promises or guarantees were given to the outcome of the procedure. All questions were answered to the best of my ability. Before the surgery the patient was encouraged to call the office if there is any further questions. The surgery will be performed at the [facility] on an outpatient basis. -Patient encouraged to call the office with any questions, concerns, change in symptoms.   Procedure: Injection Small Joint  Discussed alternatives, risks, complications and verbal consent was obtained.  Location: Right 1st MTPJ Skin Prep: Betadine. Injectate: 0.5cc 0.5% marcaine plain, 0.5 cc 2% lidocaine plain and, 1 cc kenalog 10. Disposition: Patient tolerated procedure well. Injection site dressed with a band-aid.  Post-injection care was discussed and return precautions discussed.

## 2019-11-10 DIAGNOSIS — R140 Abdominal distension (gaseous): Secondary | ICD-10-CM | POA: Diagnosis not present

## 2019-11-10 DIAGNOSIS — Z0182 Encounter for allergy testing: Secondary | ICD-10-CM | POA: Diagnosis not present

## 2019-11-28 DIAGNOSIS — M85851 Other specified disorders of bone density and structure, right thigh: Secondary | ICD-10-CM | POA: Diagnosis not present

## 2019-11-28 DIAGNOSIS — Z79899 Other long term (current) drug therapy: Secondary | ICD-10-CM | POA: Diagnosis not present

## 2019-11-28 DIAGNOSIS — M797 Fibromyalgia: Secondary | ICD-10-CM | POA: Diagnosis not present

## 2019-11-28 DIAGNOSIS — M48061 Spinal stenosis, lumbar region without neurogenic claudication: Secondary | ICD-10-CM | POA: Diagnosis not present

## 2019-11-29 ENCOUNTER — Telehealth: Payer: Self-pay

## 2019-11-29 NOTE — Telephone Encounter (Signed)
DOS 12/13/2019  KELLER/MCBRIDE BUNIONECTOMY RT - 11657 CAPSULOTOMY, MPJ RELEASE JOINT RT - 28270 REMOVAL FIXATION DEEP RT - 20680 CHILECTOMY RT - 28289  [employer]  RECEIVED FAX AUTH FOR SURGERY.  CPT [id number], 28270, 20680 & 28289 APPROVED, AUTH # [id number] GOOD FROM 12/13/19 - 03/12/2020

## 2019-12-04 DIAGNOSIS — M545 Low back pain: Secondary | ICD-10-CM | POA: Diagnosis not present

## 2019-12-08 DIAGNOSIS — J329 Chronic sinusitis, unspecified: Secondary | ICD-10-CM | POA: Diagnosis not present

## 2019-12-11 DIAGNOSIS — F4310 Post-traumatic stress disorder, unspecified: Secondary | ICD-10-CM | POA: Diagnosis not present

## 2019-12-12 DIAGNOSIS — M7061 Trochanteric bursitis, right hip: Secondary | ICD-10-CM | POA: Diagnosis not present

## 2019-12-12 DIAGNOSIS — M4327 Fusion of spine, lumbosacral region: Secondary | ICD-10-CM | POA: Diagnosis not present

## 2019-12-12 DIAGNOSIS — M7062 Trochanteric bursitis, left hip: Secondary | ICD-10-CM | POA: Diagnosis not present

## 2019-12-13 ENCOUNTER — Encounter: Payer: Self-pay | Admitting: Podiatry

## 2019-12-13 ENCOUNTER — Other Ambulatory Visit: Payer: Self-pay | Admitting: Podiatry

## 2019-12-13 DIAGNOSIS — M205X1 Other deformities of toe(s) (acquired), right foot: Secondary | ICD-10-CM | POA: Diagnosis not present

## 2019-12-13 DIAGNOSIS — M2021 Hallux rigidus, right foot: Secondary | ICD-10-CM | POA: Diagnosis not present

## 2019-12-13 DIAGNOSIS — M25571 Pain in right ankle and joints of right foot: Secondary | ICD-10-CM | POA: Diagnosis not present

## 2019-12-13 DIAGNOSIS — Z4889 Encounter for other specified surgical aftercare: Secondary | ICD-10-CM | POA: Diagnosis not present

## 2019-12-13 DIAGNOSIS — G43909 Migraine, unspecified, not intractable, without status migrainosus: Secondary | ICD-10-CM | POA: Diagnosis not present

## 2019-12-13 MED ORDER — CEPHALEXIN 500 MG PO CAPS: 500.0000 mg | ORAL_CAPSULE | Freq: Three times a day (TID) | ORAL | 0 refills | Status: DC

## 2019-12-13 MED ORDER — HYDROCODONE-ACETAMINOPHEN 5-325 MG PO TABS: 1.0000 | ORAL_TABLET | Freq: Four times a day (QID) | ORAL | 0 refills | Status: DC | PRN

## 2019-12-13 MED ORDER — PROMETHAZINE HCL 25 MG PO TABS
25.0000 mg | ORAL_TABLET | Freq: Three times a day (TID) | ORAL | 0 refills | Status: DC | PRN
Start: 2019-12-13 — End: 2020-03-26

## 2019-12-13 NOTE — Progress Notes (Signed)
Postop medications sent 

## 2019-12-20 ENCOUNTER — Ambulatory Visit (INDEPENDENT_AMBULATORY_CARE_PROVIDER_SITE_OTHER): Payer: PPO | Admitting: Podiatry

## 2019-12-20 ENCOUNTER — Other Ambulatory Visit: Payer: Self-pay

## 2019-12-20 ENCOUNTER — Ambulatory Visit (INDEPENDENT_AMBULATORY_CARE_PROVIDER_SITE_OTHER): Payer: PPO

## 2019-12-20 DIAGNOSIS — M779 Enthesopathy, unspecified: Secondary | ICD-10-CM

## 2019-12-20 DIAGNOSIS — M205X9 Other deformities of toe(s) (acquired), unspecified foot: Secondary | ICD-10-CM

## 2019-12-20 DIAGNOSIS — M7062 Trochanteric bursitis, left hip: Secondary | ICD-10-CM | POA: Diagnosis not present

## 2019-12-20 DIAGNOSIS — M205X1 Other deformities of toe(s) (acquired), right foot: Secondary | ICD-10-CM

## 2019-12-20 DIAGNOSIS — M4327 Fusion of spine, lumbosacral region: Secondary | ICD-10-CM | POA: Diagnosis not present

## 2019-12-20 DIAGNOSIS — Z9889 Other specified postprocedural states: Secondary | ICD-10-CM

## 2019-12-20 DIAGNOSIS — M7061 Trochanteric bursitis, right hip: Secondary | ICD-10-CM | POA: Diagnosis not present

## 2019-12-20 MED ORDER — DOXYCYCLINE HYCLATE 100 MG PO TABS
100.0000 mg | ORAL_TABLET | Freq: Two times a day (BID) | ORAL | 0 refills | Status: DC
Start: 2019-12-20 — End: 2020-03-26

## 2019-12-21 ENCOUNTER — Encounter: Payer: Self-pay | Admitting: Podiatry

## 2019-12-21 NOTE — Progress Notes (Signed)
Subjective:  Patient ID: Sharon Logan, female    DOB: [birth year],  MRN: [id number]  Chief Complaint  Patient presents with  . Routine Post Op     POV #1 DOS 12/13/2019 RT BIG TOE JOINT (1ST MTPJ) CAPSULOTOMY, HARDWARE REMOVAL, CHEILECTOMY VS TOTAL JOINT ARTHROPLASTY W/IMPLANT/DR WAGONER PT     64 y.o. female returns for post-op check.  Patient states that she is doing well.  She has been keeping it well bandaged.  Pain is minimal.  Sutures are intact.  Patient's pain is controlled on pain medication.  She denies any other acute complaints.  Review of Systems: Negative except as noted in the HPI. Denies N/V/F/Ch.  Past Medical History:  Diagnosis Date  . Allergic rhinitis   . Anxiety   . Arthritis   . Cancer ([place])    skin  . Chronic pain   . Chronically dry eyes   . Depression   . Fibromyalgia   . Headache   . Vitamin D deficiency   . Wears glasses     Current Outpatient Medications:  .  Carboxymethylcellulose Sodium (THERATEARS OP), Apply 1 drop to eye daily as needed (dry eyes)., Disp: , Rfl:  .  celecoxib (CELEBREX) 200 MG capsule, , Disp: , Rfl:  .  cephALEXin (KEFLEX) 500 MG capsule, Take 1 capsule (500 mg total) by mouth 3 (three) times daily., Disp: 21 capsule, Rfl: 0 .  diclofenac sodium (VOLTAREN) 1 % GEL, Apply 2 g topically 4 (four) times daily. Rub into affected area of foot 2 to 4 times daily, Disp: 100 g, Rfl: 2 .  diclofenac Sodium (VOLTAREN) 1 % GEL, , Disp: , Rfl:  .  eletriptan (RELPAX) 40 MG tablet, Take 40 mg by mouth as needed for migraine or headache. May repeat in 2 hours if headache persists or recurs., Disp: , Rfl:  .  fluticasone (FLONASE) 50 MCG/ACT nasal spray, Place 2 sprays into both nostrils 2 (two) times daily as needed for allergies or rhinitis., Disp: , Rfl:  .  HYDROcodone-acetaminophen (NORCO/VICODIN) 5-325 MG tablet, , Disp: , Rfl:  .  HYDROcodone-acetaminophen (NORCO/VICODIN) 5-325 MG tablet, Take 1-2 tablets by mouth every 6 (six) hours as  needed., Disp: 20 tablet, Rfl: 0 .  lidocaine (LIDODERM) 5 %, 1 patch daily., Disp: , Rfl:  .  Lidocaine 1.8 % PTCH, Apply 1 patch topically daily., Disp: 30 patch, Rfl: 0 .  LYRICA 50 MG capsule, Take 50 mg by mouth 2 (two) times daily., Disp: , Rfl:  .  meloxicam (MOBIC) 15 MG tablet, Take 1 tablet (15 mg total) by mouth daily., Disp: 14 tablet, Rfl: 0 .  methocarbamol (ROBAXIN) 500 MG tablet, Take 1 tablet (500 mg total) by mouth every 6 (six) hours as needed for muscle spasms., Disp: 30 tablet, Rfl: 1 .  MOVIPREP 100 g SOLR, , Disp: , Rfl:  .  NARCAN 4 MG/0.1ML LIQD nasal spray kit, Place 1 spray into the nose once. Repeat every 3 minutes as needed if no or minimal response., Disp: , Rfl:  .  nortriptyline (PAMELOR) 10 MG capsule, Take 30 mg by mouth at bedtime., Disp: , Rfl:  .  olopatadine (PATANOL) 0.1 % ophthalmic solution, Place 1 drop into both eyes 2 (two) times daily as needed for allergies. , Disp: , Rfl:  .  OVER THE COUNTER MEDICATION, Take 2 capsules by mouth 2 (two) times daily as needed (allergies). Sinus Support EF, Disp: , Rfl:  .  oxyCODONE-acetaminophen (PERCOCET/ROXICET) 5-325 MG tablet, Take  1-2 tablets by mouth every 4 (four) hours as needed for moderate pain or severe pain., Disp: 30 tablet, Rfl: 0 .  promethazine (PHENERGAN) 25 MG tablet, Take 1 tablet (25 mg total) by mouth every 8 (eight) hours as needed for nausea or vomiting., Disp: 20 tablet, Rfl: 0 .  rizatriptan (MAXALT) 10 MG tablet, Take 10 mg by mouth daily as needed for migraine. , Disp: , Rfl:  .  saccharomyces boulardii (FLORASTOR) 250 MG capsule, Take 500 mg by mouth daily., Disp: , Rfl:  .  VITAMIN D PO, Take 4 capsules by mouth daily., Disp: , Rfl:  .  doxycycline (VIBRA-TABS) 100 MG tablet, Take 1 tablet (100 mg total) by mouth 2 (two) times daily., Disp: 20 tablet, Rfl: 0 .  escitalopram (LEXAPRO) 20 MG tablet, Take 1 tablet (20 mg total) by mouth daily., Disp: 90 tablet, Rfl: 0  Social History    Tobacco Use  Smoking Status Never Smoker  Smokeless Tobacco Never Used    Allergies  Allergen Reactions  . Gabapentin Other (See Comments)  . Polymyxin B Other (See Comments)    Eyes go blood red  . Cymbalta [Duloxetine Hcl] Other (See Comments)    Headaches, constipation  . Duloxetine Other (See Comments)    HEADACHES CONSTIPATION  . Lamotrigine Rash  . Other Other (See Comments)    OTOBIOTIC > RED EYES  . Pregabalin Rash    Itchy red rash on chest  . Prozac [Fluoxetine] Other (See Comments)    CONSTIPATION  . Zolpidem Rash    "sleep walk"   Objective:  There were no vitals filed for this visit. There is no height or weight on file to calculate BMI. Constitutional Well developed. Well nourished.  Vascular Foot warm and well perfused. Capillary refill normal to all digits.   Neurologic Normal speech. Oriented to person, place, and time. Epicritic sensation to light touch grossly present bilaterally.  Dermatologic Skin healing well without signs of infection. Skin edges well coapted without signs of infection.  Mild erythema noted around the incision site less than 2 cm  Orthopedic: Tenderness to palpation noted about the surgical site.   Radiographs: Good correction alignment noted.  The implant appears to be in good position and well maintained.  No signs of osteomyelitis noted.  No signs of loosening or hardware/implant failure noted. Assessment:   1. Hallux limitus, acquired, unspecified laterality   2. Capsulitis   3. Status post foot surgery    Plan:  Patient was evaluated and treated and all questions answered.  S/p foot surgery right -Progressing as expected post-operatively. -XR: See above -WB Status: Weightbearing as tolerated in cam boot -Sutures: Intact.  Mild erythema noted surrounding the incision. -Medications: Doxycycline for skin and soft tissue prophylaxis for 10 days.  I encouraged her to complete the course.  If there is worsening of redness  I have asked her to go to the emergency room right away or call the office. -Foot redressed.  No follow-ups on file.

## 2019-12-22 DIAGNOSIS — M7062 Trochanteric bursitis, left hip: Secondary | ICD-10-CM | POA: Diagnosis not present

## 2019-12-22 DIAGNOSIS — M7061 Trochanteric bursitis, right hip: Secondary | ICD-10-CM | POA: Diagnosis not present

## 2019-12-22 DIAGNOSIS — M4327 Fusion of spine, lumbosacral region: Secondary | ICD-10-CM | POA: Diagnosis not present

## 2019-12-25 DIAGNOSIS — M7062 Trochanteric bursitis, left hip: Secondary | ICD-10-CM | POA: Diagnosis not present

## 2019-12-25 DIAGNOSIS — M4327 Fusion of spine, lumbosacral region: Secondary | ICD-10-CM | POA: Diagnosis not present

## 2019-12-25 DIAGNOSIS — M7061 Trochanteric bursitis, right hip: Secondary | ICD-10-CM | POA: Diagnosis not present

## 2019-12-27 ENCOUNTER — Ambulatory Visit (INDEPENDENT_AMBULATORY_CARE_PROVIDER_SITE_OTHER): Payer: PPO | Admitting: Podiatry

## 2019-12-27 ENCOUNTER — Other Ambulatory Visit: Payer: Self-pay

## 2019-12-27 ENCOUNTER — Encounter: Payer: Self-pay | Admitting: Podiatry

## 2019-12-27 DIAGNOSIS — M779 Enthesopathy, unspecified: Secondary | ICD-10-CM

## 2019-12-27 DIAGNOSIS — M205X9 Other deformities of toe(s) (acquired), unspecified foot: Secondary | ICD-10-CM

## 2019-12-27 NOTE — Progress Notes (Signed)
Subjective: Sharon Logan is a 64 y.o. is seen today in office s/p right first MPJ arthroplasty with implant preformed on 12/13/2019.  She states her pain is controlled she is only taking Tylenol.  She is been in the cam boot.  Denies any systemic complaints such as fevers, chills, nausea, vomiting. No calf pain, chest pain, shortness of breath.   Objective: General: No acute distress, AAOx3  DP/PT pulses palpable 2/4, CRT < 3 sec to all digits.  Protective sensation intact. Motor function intact.  Right foot: Incision is well coapted without any evidence of dehiscence. There is no surrounding erythema, ascending cellulitis, fluctuance, crepitus, malodor, drainage/purulence. There is minimal edema around the surgical site. There is no pain along the surgical site.  There is no pain with MPJ range of motion.  No areas of point tenderness. No other areas of tenderness to bilateral lower extremities.  No other open lesions or pre-ulcerative lesions.  No pain with calf compression, swelling, warmth, erythema.   Assessment and Plan:  Status post right foot surgery, doing well with no complications   -Treatment options discussed including all alternatives, risks, and complications -Reviewed the x-rays from prior.  I reviewed the sutures today.  Steri-Strips applied.  Antibiotic ointment dressing applied.  She can remove the bandage this week and start that wash with soap and water and dry thoroughly. -Continue in CAM boot -Ice/elevation  No follow-ups on file.  Trula Slade DPM

## 2019-12-28 ENCOUNTER — Encounter: Payer: PPO | Admitting: Podiatry

## 2019-12-29 DIAGNOSIS — M25511 Pain in right shoulder: Secondary | ICD-10-CM | POA: Diagnosis not present

## 2019-12-29 DIAGNOSIS — Z79899 Other long term (current) drug therapy: Secondary | ICD-10-CM | POA: Diagnosis not present

## 2019-12-29 DIAGNOSIS — M7062 Trochanteric bursitis, left hip: Secondary | ICD-10-CM | POA: Diagnosis not present

## 2019-12-29 DIAGNOSIS — M48061 Spinal stenosis, lumbar region without neurogenic claudication: Secondary | ICD-10-CM | POA: Diagnosis not present

## 2019-12-29 DIAGNOSIS — M7541 Impingement syndrome of right shoulder: Secondary | ICD-10-CM | POA: Diagnosis not present

## 2019-12-29 DIAGNOSIS — M7061 Trochanteric bursitis, right hip: Secondary | ICD-10-CM | POA: Diagnosis not present

## 2019-12-29 DIAGNOSIS — M4327 Fusion of spine, lumbosacral region: Secondary | ICD-10-CM | POA: Diagnosis not present

## 2019-12-29 DIAGNOSIS — M85851 Other specified disorders of bone density and structure, right thigh: Secondary | ICD-10-CM | POA: Diagnosis not present

## 2020-01-01 DIAGNOSIS — M7061 Trochanteric bursitis, right hip: Secondary | ICD-10-CM | POA: Diagnosis not present

## 2020-01-01 DIAGNOSIS — M4327 Fusion of spine, lumbosacral region: Secondary | ICD-10-CM | POA: Diagnosis not present

## 2020-01-01 DIAGNOSIS — M7062 Trochanteric bursitis, left hip: Secondary | ICD-10-CM | POA: Diagnosis not present

## 2020-01-05 DIAGNOSIS — M7061 Trochanteric bursitis, right hip: Secondary | ICD-10-CM | POA: Diagnosis not present

## 2020-01-05 DIAGNOSIS — M7062 Trochanteric bursitis, left hip: Secondary | ICD-10-CM | POA: Diagnosis not present

## 2020-01-05 DIAGNOSIS — M4327 Fusion of spine, lumbosacral region: Secondary | ICD-10-CM | POA: Diagnosis not present

## 2020-01-08 DIAGNOSIS — M4327 Fusion of spine, lumbosacral region: Secondary | ICD-10-CM | POA: Diagnosis not present

## 2020-01-08 DIAGNOSIS — M7062 Trochanteric bursitis, left hip: Secondary | ICD-10-CM | POA: Diagnosis not present

## 2020-01-08 DIAGNOSIS — M7061 Trochanteric bursitis, right hip: Secondary | ICD-10-CM | POA: Diagnosis not present

## 2020-01-11 ENCOUNTER — Other Ambulatory Visit: Payer: Self-pay

## 2020-01-11 ENCOUNTER — Ambulatory Visit (INDEPENDENT_AMBULATORY_CARE_PROVIDER_SITE_OTHER): Payer: PPO

## 2020-01-11 ENCOUNTER — Ambulatory Visit (INDEPENDENT_AMBULATORY_CARE_PROVIDER_SITE_OTHER): Payer: PPO | Admitting: Podiatry

## 2020-01-11 DIAGNOSIS — Z9889 Other specified postprocedural states: Secondary | ICD-10-CM

## 2020-01-11 DIAGNOSIS — M205X1 Other deformities of toe(s) (acquired), right foot: Secondary | ICD-10-CM

## 2020-01-12 DIAGNOSIS — M4327 Fusion of spine, lumbosacral region: Secondary | ICD-10-CM | POA: Diagnosis not present

## 2020-01-12 DIAGNOSIS — M7061 Trochanteric bursitis, right hip: Secondary | ICD-10-CM | POA: Diagnosis not present

## 2020-01-12 DIAGNOSIS — M7062 Trochanteric bursitis, left hip: Secondary | ICD-10-CM | POA: Diagnosis not present

## 2020-01-15 NOTE — Progress Notes (Signed)
Subjective: Sharon Logan is a 64 y.o. is seen today in office s/p right first MPJ arthroplasty with implant preformed on 12/13/2019.  She has been in the cam boot she said that she is doing very well.  She is having no significant pain.  She does not take anything for the pain currently.  She has been working with range of motion to her big toe as well.  Denies any systemic complaints such as fevers, chills, nausea, vomiting. No calf pain, chest pain, shortness of breath.   Objective: General: No acute distress, AAOx3  DP/PT pulses palpable 2/4, CRT < 3 sec to all digits.  Protective sensation intact. Motor function intact.  Right foot: Incision is well coapted without any evidence of dehiscence.  Scar is well formed.  Minimal swelling.  There is no pain with MPJ range of motion and there is improved range of motion compared to last appointment.  There is no open sores or any signs of infection noted today. No other areas of tenderness to bilateral lower extremities.  No other open lesions or pre-ulcerative lesions.  No pain with calf compression, swelling, warmth, erythema.   Assessment and Plan:  Status post right foot surgery, doing well with no complications   -Treatment options discussed including all alternatives, risks, and complications -X-rays obtained and reviewed.  Hardware intact without any complicating factors. -At this time discussed transition back to regular shoe as tolerated and will start physical therapy.  Prescription for [facility] was written.  Continue ice elevate.  Return in about 2 weeks (around 01/25/2020).  Trula Slade DPM

## 2020-01-15 NOTE — Addendum Note (Signed)
Addended by: Cranford Mon R on: 01/15/2020 10:17 AM   Modules accepted: Orders

## 2020-01-19 DIAGNOSIS — M85851 Other specified disorders of bone density and structure, right thigh: Secondary | ICD-10-CM | POA: Diagnosis not present

## 2020-01-19 DIAGNOSIS — M19079 Primary osteoarthritis, unspecified ankle and foot: Secondary | ICD-10-CM | POA: Diagnosis not present

## 2020-01-19 DIAGNOSIS — M48061 Spinal stenosis, lumbar region without neurogenic claudication: Secondary | ICD-10-CM | POA: Diagnosis not present

## 2020-01-19 DIAGNOSIS — Z79899 Other long term (current) drug therapy: Secondary | ICD-10-CM | POA: Diagnosis not present

## 2020-01-19 DIAGNOSIS — M797 Fibromyalgia: Secondary | ICD-10-CM | POA: Diagnosis not present

## 2020-01-21 DIAGNOSIS — J0110 Acute frontal sinusitis, unspecified: Secondary | ICD-10-CM | POA: Diagnosis not present

## 2020-01-25 DIAGNOSIS — M25571 Pain in right ankle and joints of right foot: Secondary | ICD-10-CM | POA: Diagnosis not present

## 2020-01-25 DIAGNOSIS — Z96698 Presence of other orthopedic joint implants: Secondary | ICD-10-CM | POA: Diagnosis not present

## 2020-01-25 DIAGNOSIS — M79674 Pain in right toe(s): Secondary | ICD-10-CM | POA: Diagnosis not present

## 2020-01-25 DIAGNOSIS — M25474 Effusion, right foot: Secondary | ICD-10-CM | POA: Diagnosis not present

## 2020-01-29 DIAGNOSIS — L570 Actinic keratosis: Secondary | ICD-10-CM | POA: Diagnosis not present

## 2020-01-29 DIAGNOSIS — L821 Other seborrheic keratosis: Secondary | ICD-10-CM | POA: Diagnosis not present

## 2020-01-29 DIAGNOSIS — Z85828 Personal history of other malignant neoplasm of skin: Secondary | ICD-10-CM | POA: Diagnosis not present

## 2020-01-30 ENCOUNTER — Other Ambulatory Visit: Payer: Self-pay

## 2020-01-30 ENCOUNTER — Ambulatory Visit (INDEPENDENT_AMBULATORY_CARE_PROVIDER_SITE_OTHER): Payer: PPO | Admitting: Podiatry

## 2020-01-30 ENCOUNTER — Encounter: Payer: Self-pay | Admitting: Podiatry

## 2020-01-30 ENCOUNTER — Ambulatory Visit (INDEPENDENT_AMBULATORY_CARE_PROVIDER_SITE_OTHER): Payer: PPO

## 2020-01-30 DIAGNOSIS — M25474 Effusion, right foot: Secondary | ICD-10-CM | POA: Diagnosis not present

## 2020-01-30 DIAGNOSIS — M25571 Pain in right ankle and joints of right foot: Secondary | ICD-10-CM | POA: Diagnosis not present

## 2020-01-30 DIAGNOSIS — M205X9 Other deformities of toe(s) (acquired), unspecified foot: Secondary | ICD-10-CM

## 2020-01-30 DIAGNOSIS — M779 Enthesopathy, unspecified: Secondary | ICD-10-CM

## 2020-01-30 DIAGNOSIS — M205X1 Other deformities of toe(s) (acquired), right foot: Secondary | ICD-10-CM

## 2020-01-30 DIAGNOSIS — M2142 Flat foot [pes planus] (acquired), left foot: Secondary | ICD-10-CM

## 2020-01-30 DIAGNOSIS — M722 Plantar fascial fibromatosis: Secondary | ICD-10-CM | POA: Diagnosis not present

## 2020-01-30 DIAGNOSIS — M79674 Pain in right toe(s): Secondary | ICD-10-CM | POA: Diagnosis not present

## 2020-01-30 DIAGNOSIS — M2141 Flat foot [pes planus] (acquired), right foot: Secondary | ICD-10-CM

## 2020-01-30 DIAGNOSIS — Z96698 Presence of other orthopedic joint implants: Secondary | ICD-10-CM | POA: Diagnosis not present

## 2020-02-01 DIAGNOSIS — M25571 Pain in right ankle and joints of right foot: Secondary | ICD-10-CM | POA: Diagnosis not present

## 2020-02-01 DIAGNOSIS — Z96698 Presence of other orthopedic joint implants: Secondary | ICD-10-CM | POA: Diagnosis not present

## 2020-02-01 DIAGNOSIS — M79674 Pain in right toe(s): Secondary | ICD-10-CM | POA: Diagnosis not present

## 2020-02-01 DIAGNOSIS — M25474 Effusion, right foot: Secondary | ICD-10-CM | POA: Diagnosis not present

## 2020-02-02 DIAGNOSIS — M79674 Pain in right toe(s): Secondary | ICD-10-CM | POA: Diagnosis not present

## 2020-02-02 DIAGNOSIS — M25474 Effusion, right foot: Secondary | ICD-10-CM | POA: Diagnosis not present

## 2020-02-02 DIAGNOSIS — Z96698 Presence of other orthopedic joint implants: Secondary | ICD-10-CM | POA: Diagnosis not present

## 2020-02-02 DIAGNOSIS — M25571 Pain in right ankle and joints of right foot: Secondary | ICD-10-CM | POA: Diagnosis not present

## 2020-02-05 DIAGNOSIS — R42 Dizziness and giddiness: Secondary | ICD-10-CM | POA: Diagnosis not present

## 2020-02-05 NOTE — Progress Notes (Signed)
Subjective: Sharon Logan is a 64 y.o. is seen today in office s/p right first MPJ arthroplasty with implant preformed on 12/13/2019.  She has gone to physical therapy and she feels that this will be helpful for her.  She did get some pain the bottom of her foot pointing to submetatarsal 2.  She states that she was having some of this discomfort prior to surgery but now she is back to walking she is noticing it more.  No significant pain on the surgical site itself.  She thinks that she needs to have a new orthotic made.  No recent injuries or falls and she has no other concerns today. Denies any systemic complaints such as fevers, chills, nausea, vomiting. No calf pain, chest pain, shortness of breath.   Objective: General: No acute distress, AAOx3  DP/PT pulses palpable 2/4, CRT < 3 sec to all digits.  Protective sensation intact. Motor function intact.  Right foot: Incision is well coapted without any evidence of dehiscence.  Scars present on the surgical site.  Mild restriction of first MPJ range of motion but there is no pain.  Majority discomfort submetatarsal 2 and second MPJ.  No area pinpoint tenderness otherwise.  There is no edema, erythema. No other areas of tenderness to bilateral lower extremities.  No other open lesions or pre-ulcerative lesions.  No pain with calf compression, swelling, warmth, erythema.   Assessment and Plan:  Status post right foot surgery, second MPJ capsulitis  -Treatment options discussed including all alternatives, risks, and complications -X-rays obtained reviewed.  Hardware intact the first MPJ.  No evidence of acute fracture or stress fracture. -Continue physical therapy.  Also recommended new set of orthotics and she was measured today by Liliane Channel our ped orthotist for this.  We will offload submetatarsal 2.  Also given her history of plantar fasciitis and capsulitis that this will be helpful for these other issues as well.  Return in about 4 weeks (around  02/27/2020).  Trula Slade DPM

## 2020-02-06 DIAGNOSIS — M25474 Effusion, right foot: Secondary | ICD-10-CM | POA: Diagnosis not present

## 2020-02-06 DIAGNOSIS — M25571 Pain in right ankle and joints of right foot: Secondary | ICD-10-CM | POA: Diagnosis not present

## 2020-02-06 DIAGNOSIS — Z96698 Presence of other orthopedic joint implants: Secondary | ICD-10-CM | POA: Diagnosis not present

## 2020-02-06 DIAGNOSIS — M79674 Pain in right toe(s): Secondary | ICD-10-CM | POA: Diagnosis not present

## 2020-02-07 DIAGNOSIS — F4310 Post-traumatic stress disorder, unspecified: Secondary | ICD-10-CM | POA: Diagnosis not present

## 2020-02-08 DIAGNOSIS — Z96698 Presence of other orthopedic joint implants: Secondary | ICD-10-CM | POA: Diagnosis not present

## 2020-02-08 DIAGNOSIS — M25474 Effusion, right foot: Secondary | ICD-10-CM | POA: Diagnosis not present

## 2020-02-08 DIAGNOSIS — M25571 Pain in right ankle and joints of right foot: Secondary | ICD-10-CM | POA: Diagnosis not present

## 2020-02-08 DIAGNOSIS — M79674 Pain in right toe(s): Secondary | ICD-10-CM | POA: Diagnosis not present

## 2020-02-09 DIAGNOSIS — H6192 Disorder of left external ear, unspecified: Secondary | ICD-10-CM | POA: Diagnosis not present

## 2020-02-09 DIAGNOSIS — R252 Cramp and spasm: Secondary | ICD-10-CM | POA: Diagnosis not present

## 2020-02-09 DIAGNOSIS — M797 Fibromyalgia: Secondary | ICD-10-CM | POA: Diagnosis not present

## 2020-02-09 DIAGNOSIS — M25511 Pain in right shoulder: Secondary | ICD-10-CM | POA: Diagnosis not present

## 2020-02-12 DIAGNOSIS — D485 Neoplasm of uncertain behavior of skin: Secondary | ICD-10-CM | POA: Diagnosis not present

## 2020-02-13 DIAGNOSIS — M25474 Effusion, right foot: Secondary | ICD-10-CM | POA: Diagnosis not present

## 2020-02-13 DIAGNOSIS — M25571 Pain in right ankle and joints of right foot: Secondary | ICD-10-CM | POA: Diagnosis not present

## 2020-02-13 DIAGNOSIS — Z96698 Presence of other orthopedic joint implants: Secondary | ICD-10-CM | POA: Diagnosis not present

## 2020-02-13 DIAGNOSIS — M79674 Pain in right toe(s): Secondary | ICD-10-CM | POA: Diagnosis not present

## 2020-02-15 ENCOUNTER — Other Ambulatory Visit: Payer: Self-pay | Admitting: Podiatry

## 2020-02-15 DIAGNOSIS — Z96698 Presence of other orthopedic joint implants: Secondary | ICD-10-CM | POA: Diagnosis not present

## 2020-02-15 DIAGNOSIS — M25474 Effusion, right foot: Secondary | ICD-10-CM | POA: Diagnosis not present

## 2020-02-15 DIAGNOSIS — M25571 Pain in right ankle and joints of right foot: Secondary | ICD-10-CM | POA: Diagnosis not present

## 2020-02-15 DIAGNOSIS — M79674 Pain in right toe(s): Secondary | ICD-10-CM | POA: Diagnosis not present

## 2020-02-15 DIAGNOSIS — M205X1 Other deformities of toe(s) (acquired), right foot: Secondary | ICD-10-CM

## 2020-02-20 ENCOUNTER — Other Ambulatory Visit: Payer: Self-pay | Admitting: Endocrinology

## 2020-02-20 DIAGNOSIS — H61001 Unspecified perichondritis of right external ear: Secondary | ICD-10-CM | POA: Diagnosis not present

## 2020-02-20 DIAGNOSIS — M25511 Pain in right shoulder: Secondary | ICD-10-CM

## 2020-02-27 DIAGNOSIS — M79674 Pain in right toe(s): Secondary | ICD-10-CM | POA: Diagnosis not present

## 2020-02-27 DIAGNOSIS — M85851 Other specified disorders of bone density and structure, right thigh: Secondary | ICD-10-CM | POA: Diagnosis not present

## 2020-02-27 DIAGNOSIS — Z96698 Presence of other orthopedic joint implants: Secondary | ICD-10-CM | POA: Diagnosis not present

## 2020-02-27 DIAGNOSIS — M48061 Spinal stenosis, lumbar region without neurogenic claudication: Secondary | ICD-10-CM | POA: Diagnosis not present

## 2020-02-27 DIAGNOSIS — M25571 Pain in right ankle and joints of right foot: Secondary | ICD-10-CM | POA: Diagnosis not present

## 2020-02-27 DIAGNOSIS — Z79899 Other long term (current) drug therapy: Secondary | ICD-10-CM | POA: Diagnosis not present

## 2020-02-27 DIAGNOSIS — M797 Fibromyalgia: Secondary | ICD-10-CM | POA: Diagnosis not present

## 2020-02-27 DIAGNOSIS — M25474 Effusion, right foot: Secondary | ICD-10-CM | POA: Diagnosis not present

## 2020-02-29 DIAGNOSIS — Z96698 Presence of other orthopedic joint implants: Secondary | ICD-10-CM | POA: Diagnosis not present

## 2020-02-29 DIAGNOSIS — M25474 Effusion, right foot: Secondary | ICD-10-CM | POA: Diagnosis not present

## 2020-02-29 DIAGNOSIS — M79674 Pain in right toe(s): Secondary | ICD-10-CM | POA: Diagnosis not present

## 2020-02-29 DIAGNOSIS — M25571 Pain in right ankle and joints of right foot: Secondary | ICD-10-CM | POA: Diagnosis not present

## 2020-03-01 DIAGNOSIS — F4310 Post-traumatic stress disorder, unspecified: Secondary | ICD-10-CM | POA: Diagnosis not present

## 2020-03-04 DIAGNOSIS — R42 Dizziness and giddiness: Secondary | ICD-10-CM | POA: Diagnosis not present

## 2020-03-05 ENCOUNTER — Other Ambulatory Visit: Payer: Self-pay

## 2020-03-05 ENCOUNTER — Ambulatory Visit (INDEPENDENT_AMBULATORY_CARE_PROVIDER_SITE_OTHER): Payer: PPO | Admitting: Podiatry

## 2020-03-05 ENCOUNTER — Ambulatory Visit (INDEPENDENT_AMBULATORY_CARE_PROVIDER_SITE_OTHER): Payer: PPO | Admitting: Orthotics

## 2020-03-05 DIAGNOSIS — M2142 Flat foot [pes planus] (acquired), left foot: Secondary | ICD-10-CM

## 2020-03-05 DIAGNOSIS — M779 Enthesopathy, unspecified: Secondary | ICD-10-CM

## 2020-03-05 DIAGNOSIS — M205X9 Other deformities of toe(s) (acquired), unspecified foot: Secondary | ICD-10-CM

## 2020-03-05 DIAGNOSIS — M2141 Flat foot [pes planus] (acquired), right foot: Secondary | ICD-10-CM

## 2020-03-05 NOTE — Progress Notes (Signed)
Patient came in today to pick up custom made foot orthotics.  The goals were accomplished and the patient reported no dissatisfaction with said orthotics.  Patient was advised of breakin period and how to report any issues. 

## 2020-03-06 NOTE — Progress Notes (Signed)
Subjective: Sharon Logan is a 64 y.o. is seen today in office s/p right first MPJ arthroplasty with implant preformed on 12/13/2019.  She states that she had more cramping discomfort at times but overall she has been doing better.  She finished physical therapy but she been doing home therapy.  She also presents with a pick up orthotics.  No recent injury or falls or changes otherwise. Denies any systemic complaints such as fevers, chills, nausea, vomiting. No calf pain, chest pain, shortness of breath.   Objective: General: No acute distress, AAOx3  DP/PT pulses palpable 2/4, CRT < 3 sec to all digits.  Protective sensation intact. Motor function intact.  Right foot: Incision is well coapted without any evidence of dehiscence.  Improved range of motion of first MPJ although some restriction.  Tender submetatarsal 2 proximal metatarsal head plantarly.  MMT 5/5 in no other areas of discomfort identified. No other open lesions or pre-ulcerative lesions.  No pain with calf compression, swelling, warmth, erythema.   Assessment and Plan:  Status post right foot surgery, second MPJ capsulitis  -Treatment options discussed including all alternatives, risks, and complications -I would like for her to continue with home range of motion exercises were also wearing supportive shoes.  Orthotics were dispensed today.  Continue ice elevate.  Compression anklet as needed.  Return in about 6 weeks (around 04/16/2020).  Trula Slade DPM

## 2020-03-11 DIAGNOSIS — R6889 Other general symptoms and signs: Secondary | ICD-10-CM | POA: Diagnosis not present

## 2020-03-11 DIAGNOSIS — Z20822 Contact with and (suspected) exposure to covid-19: Secondary | ICD-10-CM | POA: Diagnosis not present

## 2020-03-11 DIAGNOSIS — H6123 Impacted cerumen, bilateral: Secondary | ICD-10-CM | POA: Diagnosis not present

## 2020-03-11 DIAGNOSIS — J018 Other acute sinusitis: Secondary | ICD-10-CM | POA: Diagnosis not present

## 2020-03-13 ENCOUNTER — Ambulatory Visit
Admission: RE | Admit: 2020-03-13 | Discharge: 2020-03-13 | Disposition: A | Discharge status: Home / Self Care | Payer: PPO | Source: Ambulatory Visit | Attending: Endocrinology | Admitting: Endocrinology

## 2020-03-13 DIAGNOSIS — M25511 Pain in right shoulder: Secondary | ICD-10-CM | POA: Diagnosis not present

## 2020-03-13 DIAGNOSIS — R209 Unspecified disturbances of skin sensation: Secondary | ICD-10-CM | POA: Diagnosis not present

## 2020-03-13 DIAGNOSIS — F3341 Major depressive disorder, recurrent, in partial remission: Secondary | ICD-10-CM | POA: Diagnosis not present

## 2020-03-13 DIAGNOSIS — D508 Other iron deficiency anemias: Secondary | ICD-10-CM | POA: Diagnosis not present

## 2020-03-13 DIAGNOSIS — R2689 Other abnormalities of gait and mobility: Secondary | ICD-10-CM | POA: Diagnosis not present

## 2020-03-13 DIAGNOSIS — G43009 Migraine without aura, not intractable, without status migrainosus: Secondary | ICD-10-CM | POA: Diagnosis not present

## 2020-03-13 IMAGING — MR MR SHOULDER*R* W/O CM
5 series · 35 of 40 positions shown · non-contrast
Comparison: None.

CLINICAL DATA: Increasing right shoulder pain since an unspecified
injury in mid [DATE].

EXAM:
MRI OF THE RIGHT SHOULDER WITHOUT CONTRAST
TECHNIQUE: Multiplanar, multisequence MR imaging of the shoulder was performed.
No intravenous contrast was administered.

[Series 3: PD fat-sat · axial · 4.0mm · 0.55mm/px · z∈[-22,+72]mm · 8 of 23 slices shown (1 of 2)]
[im 1/23]
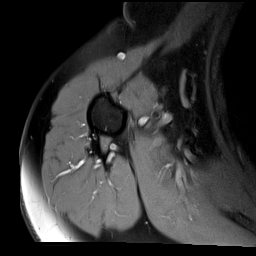
[im 3/23]
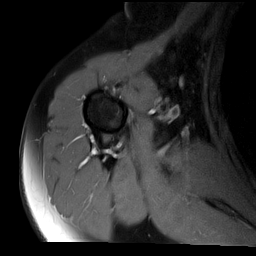
[im 8/23]
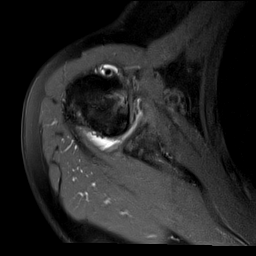
[im 10/23]
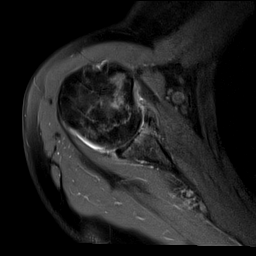
[im 13/23]
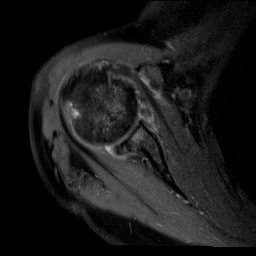
[im 15/23]
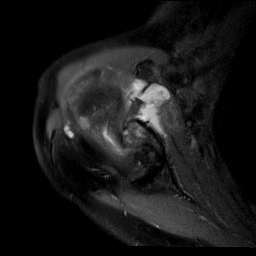
[im 20/23]
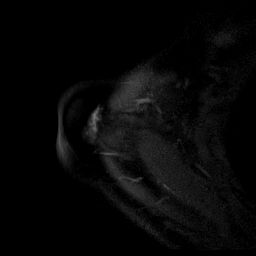
[im 23/23]
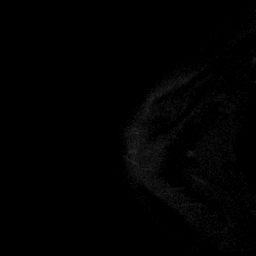

[Series 4: T2 fat-sat · oblique · 4.0mm · 0.55mm/px · 7 of 16 slices shown (1 of 2)]
[im 1/16]
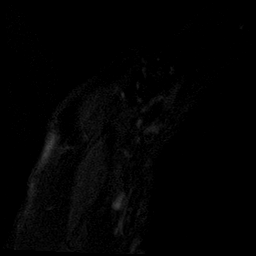
[im 3/16]
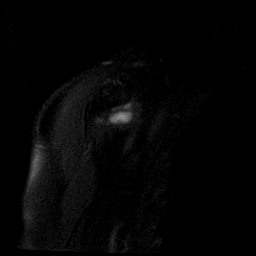
[im 6/16]
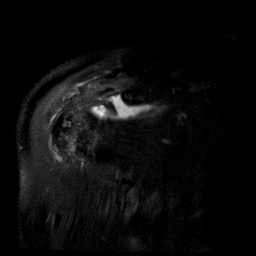
[im 8/16]
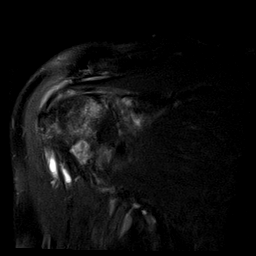
[im 11/16]
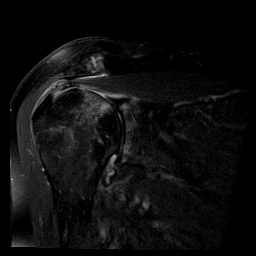
[im 13/16]
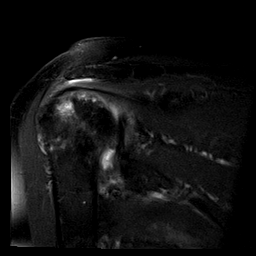
[im 16/16]
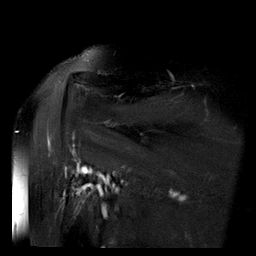

[Series 5: PD fat-sat · oblique · 4.0mm · 0.27mm/px · 7 of 16 slices shown (2 of 2)]
[im 1/16]
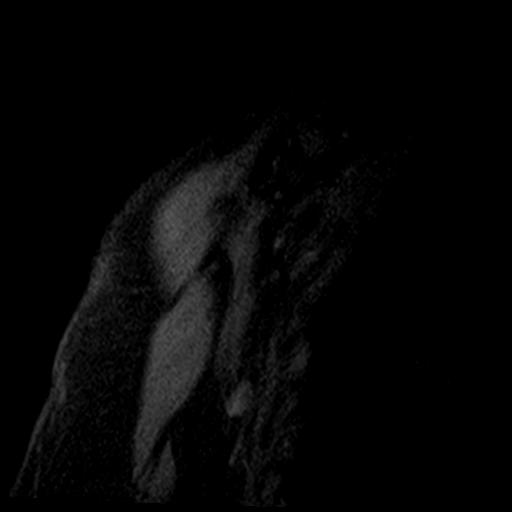
[im 3/16]
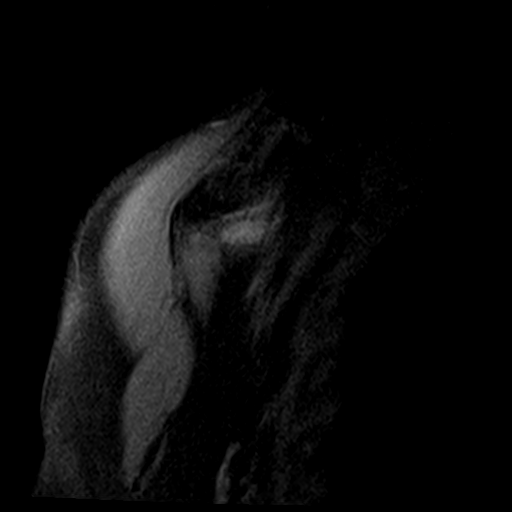
[im 6/16]
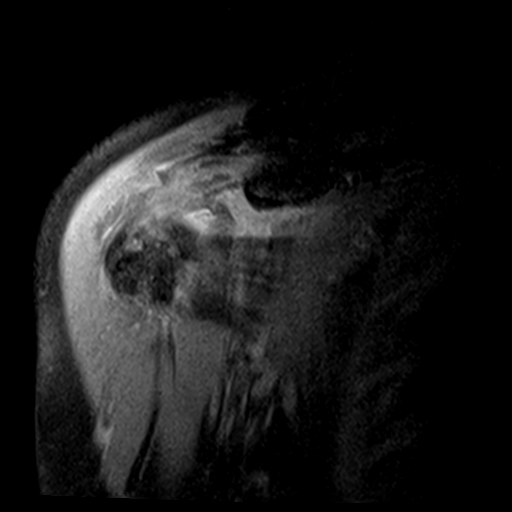
[im 8/16]
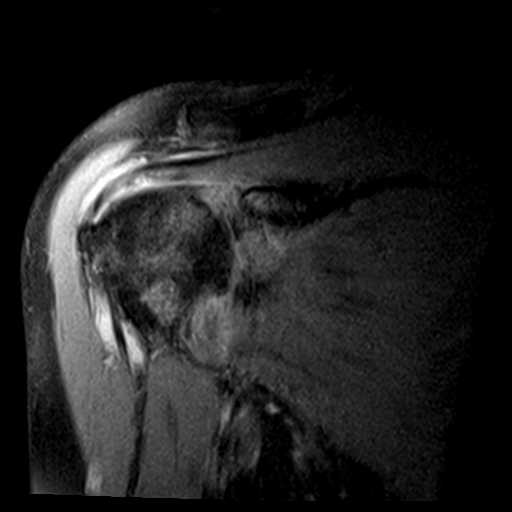
[im 11/16]
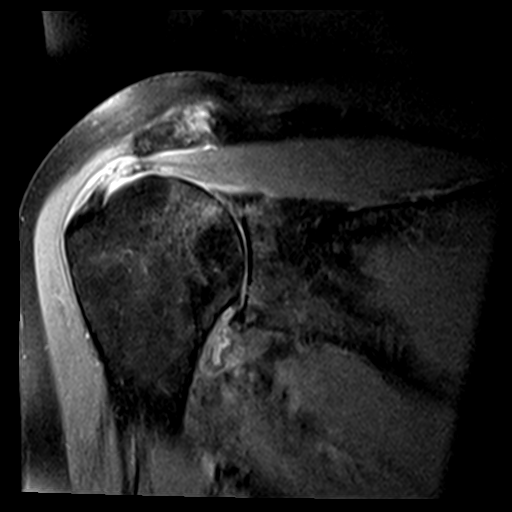
[im 13/16]
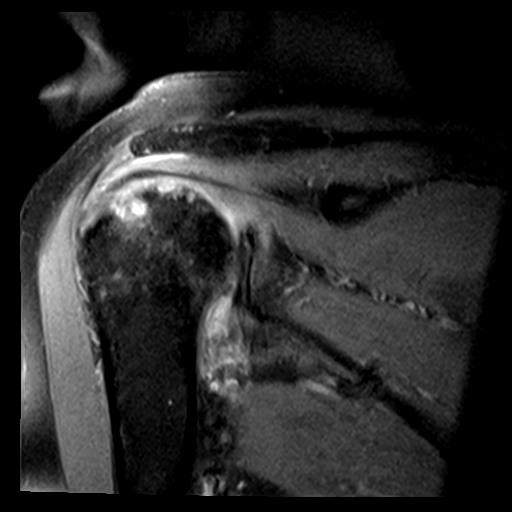
[im 16/16]
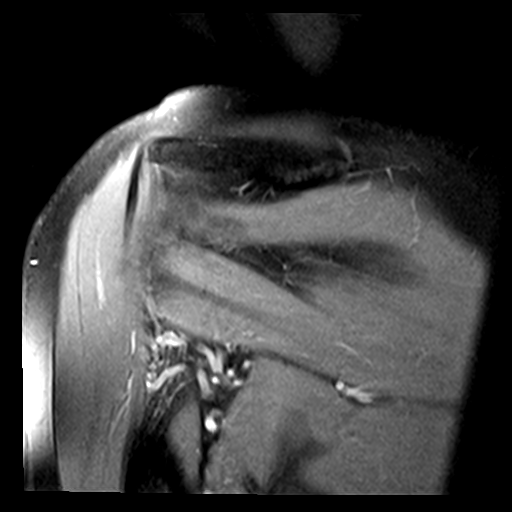

[Series 6: T1 · oblique · 4.0mm · 0.27mm/px · 5 of 19 slices shown]
[im 1/19]
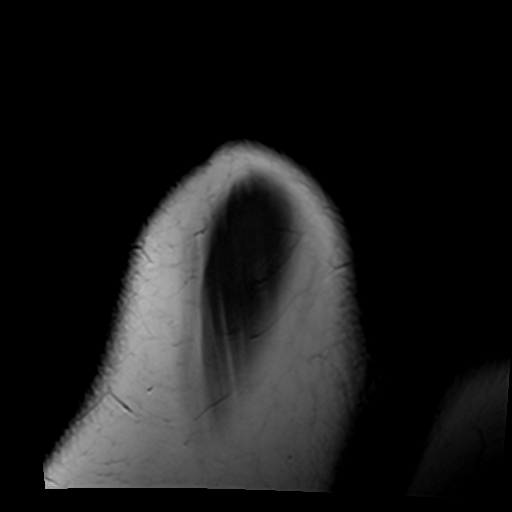
[im 3/19]
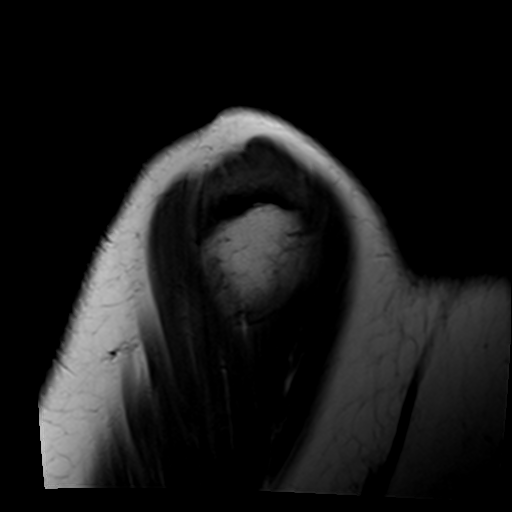
[im 6/19]
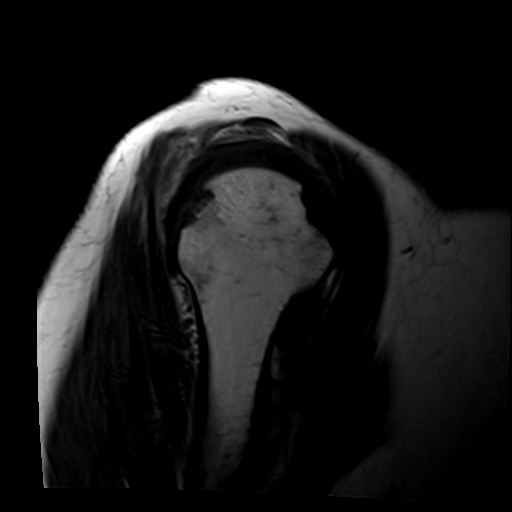
[im 8/19]
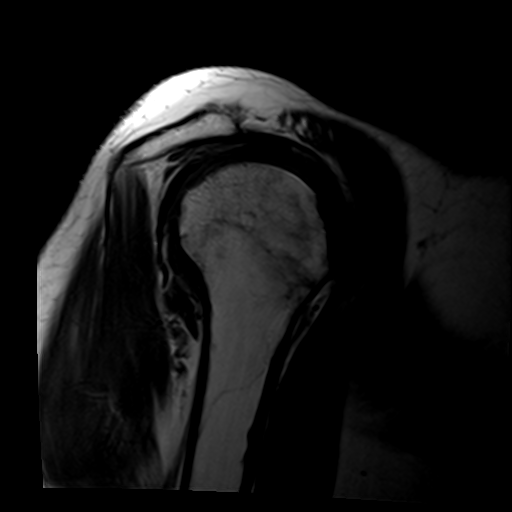
[im 11/19]
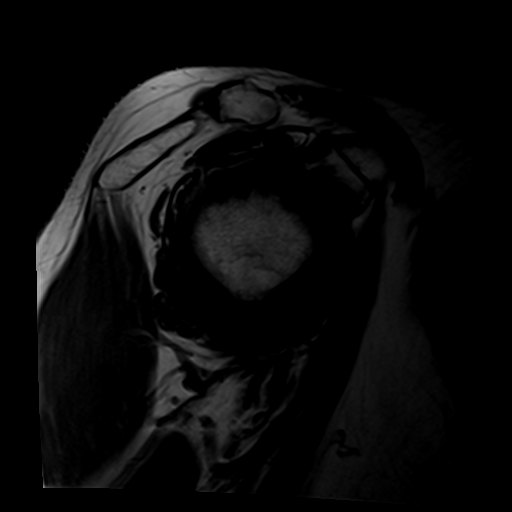

[Series 7: T2 fat-sat · oblique · 4.0mm · 0.55mm/px · 8 of 19 slices shown (2 of 2)]
[im 1/19]
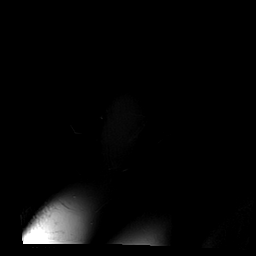
[im 3/19]
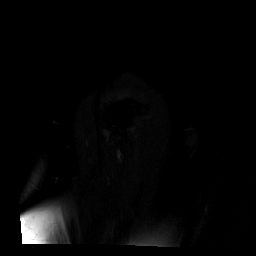
[im 6/19]
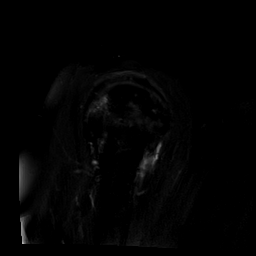
[im 8/19]
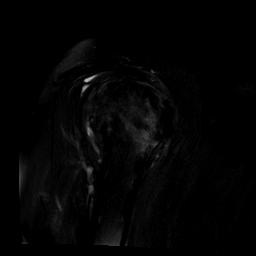
[im 11/19]
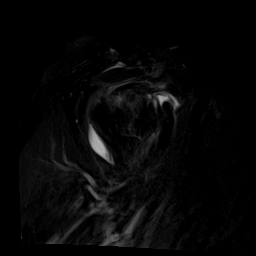
[im 13/19]
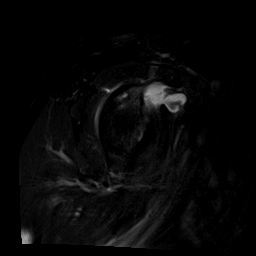
[im 16/19]
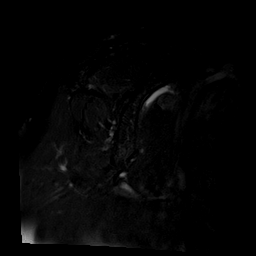
[im 19/19]
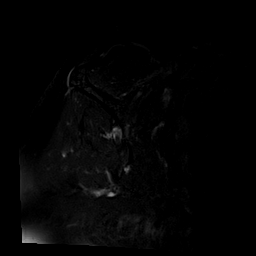

[35 of 40 positions shown; findings below may reference images not displayed]

FINDINGS: Rotator cuff: Intact. Heterogeneously increased T2 signal is seen in
the supraspinatus and infraspinatus tendons consistent with
tendinopathy.

Muscles:  Normal without atrophy or focal lesion.

Biceps long head:  Intact.

Acromioclavicular Joint: Moderate osteoarthritis. Type 1 acromion.
There is fluid in the subacromial/subdeltoid bursa.

Glenohumeral Joint: Cartilage is thinned throughout. There is some
subchondral edema about the joint. No notable subchondral cyst
formation. Tiny osteophyte off the humeral head noted.

Labrum:  There is a tear of the anterior, inferior labrum.

Bones: The patient has a fracture of the anterior, inferior glenoid
consistent with a bony Bankart. Fracture fragment measures
approximately 1.9 cm craniocaudal by 0.6 cm transverse by up to
cm AP. Small Hill-Sachs lesion with associated mild marrow edema is
identified.

Other: None.
IMPRESSION: Bony Bankart, anterior, inferior labral tear and Hill-Sachs lesion
consistent with prior anterior shoulder dislocation. Although
difficult to definitively characterize, the injury appears to be
subacute to late subacute.

Moderate to moderately severe glenohumeral osteoarthritis.

Supraspinatus and infraspinatus tendinopathy without tear.

Moderate acromioclavicular osteoarthritis.

Subacromial/subdeltoid fluid consistent with bursitis.

## 2020-03-14 ENCOUNTER — Encounter: Payer: Self-pay | Admitting: Neurology

## 2020-03-15 ENCOUNTER — Other Ambulatory Visit: Payer: PPO

## 2020-03-19 ENCOUNTER — Other Ambulatory Visit: Payer: Self-pay

## 2020-03-19 ENCOUNTER — Ambulatory Visit: Payer: PPO | Admitting: Orthotics

## 2020-03-19 DIAGNOSIS — M205X9 Other deformities of toe(s) (acquired), unspecified foot: Secondary | ICD-10-CM

## 2020-03-19 DIAGNOSIS — M2142 Flat foot [pes planus] (acquired), left foot: Secondary | ICD-10-CM

## 2020-03-19 NOTE — Progress Notes (Signed)
Added more offload by adding k wedge.

## 2020-03-25 NOTE — Progress Notes (Signed)
NEUROLOGY CONSULTATION NOTE  Sharon Logan MRN: [id number] DOB: 01-02-1956  Referring provider: Marda Stalker, PA-C Primary care provider: Marda Stalker, PA-C  Reason for consult: migraines and dizziness  HISTORY OF PRESENT ILLNESS: Sharon Logan is a 64 year old right-handed female with fibromyalgia and depression who presents for migraines and dizziness.  History supplemented by referring provider's note.  Triggers:  Weather, smells, poor sleep  She has had migraines "all my life" but worse since menopause.  Weather always is a trigger.  Headaches have been worse.  Typical headaches are across the forehead.  They have changed over the past 3 to 4 months.  They last 2-3 hours with Maxalt and occur about 15 days a month.  They are now bilateral retro-orbital aching pain.  There is associated with nausea, photophobia, phonophobia and in the past left greater than right arm numbness.  She now has associated dizziness, described as a spinning sensation in which she needs to hold onto something, lasting a couple of minutes.  It was originally 2 times a day and then 1 to 2 times a week.  It is not positional but occurs with activity such as playing with her dog on the floor or exercising such as yoga.  Dizziness causes problems with balance.  No double vision.  Sometimes dizziness occurs independent from the headaches.  She tried vestibular rehab.  She has fibromyalgia and chronic low back pain and bilateral lumbar radiculopathy with known spondylosis and spinal stenosis of the lumbar spine s/p L4-5 fusion and later L3-S1 fusion revision.  She has previously been followed by surgery, pain management and neurology.  She has failed Cymbalta, gabapentin, Lyrica, Nucenta, baclofen, diclofenac, Celebrex, injections and surgery.  Current NSAIDS/analgesics:  Hydrocodone-acetaminophen Current triptans:  Maxalt 75m Current Antidepressant medications:  Venlafaxine 765mdaily Other therapy:  Botox  (effective for 2 months and then wears off the last month)  Past NSAIDS/analgesics:  Ibuprofen, naproxen, Excedrin, Fioricet Past abortive triptans:  Relpax, sumatriptan Past abortive ergotamine:  none Past anti-emetic:  none  Past antihypertensive medications:  none Past antidepressant medications:  Nortriptyline, Lexapro Past anticonvulsant medications:  Topiramate, Depakote Past anti-CGRP:  none Other past therapies:  none  Caffeine:  2 cups black tea daily.  1 [place] a week.  No coffee. Diet:  100 oz water daily.  Does not skip meals Exercise:  yes Depression:  Generally controlled; but a little worse now. Other pain:  Fibromyalgia, back pain Sleep hygiene:  Gets a good 5 hours a night. Family history of headache:  Mom   PAST MEDICAL HISTORY: Past Medical History:  Diagnosis Date  . Allergic rhinitis   . Anxiety   . Arthritis   . Cancer ([facility]   skin  . Chronic pain   . Chronically dry eyes   . Depression   . Fibromyalgia   . Headache   . Vitamin D deficiency   . Wears glasses     PAST SURGICAL HISTORY: Past Surgical History:  Procedure Laterality Date  . ABDOMINAL HYSTERECTOMY    . BACK SURGERY    . BUNIONECTOMY Bilateral   . NASAL SINUS SURGERY     x2  . SKIN CANCER EXCISION     face x3    MEDICATIONS: Current Outpatient Medications on File Prior to Visit  Medication Sig Dispense Refill  . Carboxymethylcellulose Sodium (THERATEARS OP) Apply 1 drop to eye daily as needed (dry eyes).    . celecoxib (CELEBREX) 200 MG capsule     .  cephALEXin (KEFLEX) 500 MG capsule Take 1 capsule (500 mg total) by mouth 3 (three) times daily. 21 capsule 0  . diclofenac sodium (VOLTAREN) 1 % GEL Apply 2 g topically 4 (four) times daily. Rub into affected area of foot 2 to 4 times daily 100 g 2  . diclofenac Sodium (VOLTAREN) 1 % GEL     . doxycycline (VIBRA-TABS) 100 MG tablet Take 1 tablet (100 mg total) by mouth 2 (two) times daily. 20 tablet 0  . eletriptan  (RELPAX) 40 MG tablet Take 40 mg by mouth as needed for migraine or headache. May repeat in 2 hours if headache persists or recurs.    Marland Kitchen escitalopram (LEXAPRO) 20 MG tablet Take 1 tablet (20 mg total) by mouth daily. 90 tablet 0  . fluticasone (FLONASE) 50 MCG/ACT nasal spray Place 2 sprays into both nostrils 2 (two) times daily as needed for allergies or rhinitis.    Marland Kitchen HYDROcodone-acetaminophen (NORCO/VICODIN) 5-325 MG tablet     . HYDROcodone-acetaminophen (NORCO/VICODIN) 5-325 MG tablet Take 1-2 tablets by mouth every 6 (six) hours as needed. 20 tablet 0  . lidocaine (LIDODERM) 5 % 1 patch daily.    . Lidocaine 1.8 % PTCH Apply 1 patch topically daily. 30 patch 0  . LYRICA 50 MG capsule Take 50 mg by mouth 2 (two) times daily.    . meloxicam (MOBIC) 15 MG tablet Take 1 tablet (15 mg total) by mouth daily. 14 tablet 0  . methocarbamol (ROBAXIN) 500 MG tablet Take 1 tablet (500 mg total) by mouth every 6 (six) hours as needed for muscle spasms. 30 tablet 1  . MOVIPREP 100 g SOLR     . NARCAN 4 MG/0.1ML LIQD nasal spray kit Place 1 spray into the nose once. Repeat every 3 minutes as needed if no or minimal response.    . nortriptyline (PAMELOR) 10 MG capsule Take 30 mg by mouth at bedtime.    Marland Kitchen olopatadine (PATANOL) 0.1 % ophthalmic solution Place 1 drop into both eyes 2 (two) times daily as needed for allergies.     Marland Kitchen OVER THE COUNTER MEDICATION Take 2 capsules by mouth 2 (two) times daily as needed (allergies). Sinus Support EF    . oxyCODONE-acetaminophen (PERCOCET/ROXICET) 5-325 MG tablet Take 1-2 tablets by mouth every 4 (four) hours as needed for moderate pain or severe pain. 30 tablet 0  . promethazine (PHENERGAN) 25 MG tablet Take 1 tablet (25 mg total) by mouth every 8 (eight) hours as needed for nausea or vomiting. 20 tablet 0  . rizatriptan (MAXALT) 10 MG tablet Take 10 mg by mouth daily as needed for migraine.     . saccharomyces boulardii (FLORASTOR) 250 MG capsule Take 500 mg by mouth  daily.    Marland Kitchen VITAMIN D PO Take 4 capsules by mouth daily.     No current facility-administered medications on file prior to visit.    ALLERGIES: Allergies  Allergen Reactions  . Gabapentin Other (See Comments)  . Polymyxin B Other (See Comments)    Eyes go blood red  . Cymbalta [Duloxetine Hcl] Other (See Comments)    Headaches, constipation  . Duloxetine Other (See Comments)    HEADACHES CONSTIPATION  . Lamotrigine Rash  . Other Other (See Comments)    OTOBIOTIC > RED EYES  . Pregabalin Rash    Itchy red rash on chest  . Prozac [Fluoxetine] Other (See Comments)    CONSTIPATION  . Zolpidem Rash    "sleep walk"  FAMILY HISTORY: Family History  Problem Relation Age of Onset  . Depression Mother   . Heart attack Mother   . COPD Mother   . Depression Father   . Heart attack Father   . ADD / ADHD Son    SOCIAL HISTORY: Social History   Socioeconomic History  . Marital status: Widowed    Spouse name: Not on file  . Number of children: Not on file  . Years of education: Not on file  . Highest education level: Not on file  Occupational History  . Not on file  Tobacco Use  . Smoking status: Never Smoker  . Smokeless tobacco: Never Used  Vaping Use  . Vaping Use: Never used  Substance and Sexual Activity  . Alcohol use: No  . Drug use: No  . Sexual activity: Not on file  Other Topics Concern  . Not on file  Social History Narrative  . Not on file   Social Determinants of Health   Financial Resource Strain:   . Difficulty of Paying Living Expenses: Not on file  Food Insecurity:   . Worried About Charity fundraiser in the Last Year: Not on file  . Ran Out of Food in the Last Year: Not on file  Transportation Needs:   . Lack of Transportation (Medical): Not on file  . Lack of Transportation (Non-Medical): Not on file  Physical Activity:   . Days of Exercise per Week: Not on file  . Minutes of Exercise per Session: Not on file  Stress:   . Feeling of  Stress : Not on file  Social Connections:   . Frequency of Communication with Friends and Family: Not on file  . Frequency of Social Gatherings with Friends and Family: Not on file  . Attends Religious Services: Not on file  . Active Member of Clubs or Organizations: Not on file  . Attends Archivist Meetings: Not on file  . Marital Status: Not on file  Intimate Partner Violence:   . Fear of Current or Ex-Partner: Not on file  . Emotionally Abused: Not on file  . Physically Abused: Not on file  . Sexually Abused: Not on file    PHYSICAL EXAM: Blood pressure 118/73, pulse 66, height _0  (1.626 m), weight 149 lb 9.6 oz (67.9 kg), SpO2 97 %. General: No acute distress.  Patient appears well-groomed.   Head:  Normocephalic/atraumatic Eyes:  fundi examined but not visualized Neck: supple, no paraspinal tenderness, full range of motion Back: No paraspinal tenderness Heart: regular rate and rhythm Lungs: Clear to auscultation bilaterally. Vascular: No carotid bruits. Neurological Exam: Mental status: alert and oriented to person, place, and time, recent and remote memory intact, fund of knowledge intact, attention and concentration intact, speech fluent and not dysarthric, language intact. Cranial nerves: CN I: not tested CN II: pupils equal, round and reactive to light, visual fields intact CN III, IV, VI:  full range of motion, no nystagmus, no ptosis CN V: facial sensation intact CN VII: upper and lower face symmetric CN VIII: hearing intact CN IX, X: gag intact, uvula midline CN XI: sternocleidomastoid and trapezius muscles intact CN XII: tongue midline Bulk & Tone: normal, no fasciculations. Motor:  5/5 throughout Sensation:  Pinprick and vibration sensation intact Deep Tendon Reflexes:  2+ throughout,  toes downgoing.   Finger to nose testing:  Without dysmetria.   Heel to shin:  Without dysmetria.   Gait:  Normal station and stride.  Able to  turn.  Difficulty  tandem walk.  Romberg negative.  IMPRESSION: 1.  Chronic migraine without aura, increased frequency. 2.  Dizziness.  May be peripheral vs migraine related  PLAN: 1.  Will try to start Aimovig 22m.  She is willing to stop Botox if needed for approval.  Already on venlafaxine. 2.  Maxalt 136mas needed. 3.  Limit use of pain relievers to no more than 2 days out of week to prevent risk of rebound or medication-overuse headache. 4.  Keep headache diary 5.  Caffeine cessation, exercise, sleep hygiene 6.  Follow up in 4 to 6 months.  Thank you for allowing me to take part in the care of this patient.  AdMetta ClinesDO  CC: CoMarda StalkerPA-C

## 2020-03-26 ENCOUNTER — Encounter: Payer: Self-pay | Admitting: Neurology

## 2020-03-26 ENCOUNTER — Ambulatory Visit (INDEPENDENT_AMBULATORY_CARE_PROVIDER_SITE_OTHER): Payer: PPO | Admitting: Neurology

## 2020-03-26 ENCOUNTER — Other Ambulatory Visit: Payer: Self-pay

## 2020-03-26 VITALS — BP 118/73 | HR 66 | Ht 64.0 in | Wt 149.6 lb

## 2020-03-26 DIAGNOSIS — G43709 Chronic migraine without aura, not intractable, without status migrainosus: Secondary | ICD-10-CM | POA: Diagnosis not present

## 2020-03-26 DIAGNOSIS — R42 Dizziness and giddiness: Secondary | ICD-10-CM | POA: Diagnosis not present

## 2020-03-26 MED ORDER — AIMOVIG 70 MG/ML ~~LOC~~ SOAJ: 70.0000 mg | SUBCUTANEOUS | 5 refills | Status: DC

## 2020-03-26 NOTE — Patient Instructions (Signed)
  1. We will try to start Aimovig 70mg  injection every 28 days 2. Take rizatriptan 10mg  at earliest onset of headache.  May repeat dose once in 2 hours if needed.  Maximum 2 tablets in 24 hours. 3. Limit use of pain relievers to no more than 2 days out of the week.  These medications include acetaminophen, NSAIDs (ibuprofen/Advil/Motrin, naproxen/Aleve, triptans (Imitrex/sumatriptan), Excedrin, and narcotics.  This will help reduce risk of rebound headaches. 4. Be aware of common food triggers:  - Caffeine:  coffee, black tea, cola, Mt. Dew  - Chocolate  - Dairy:  aged cheeses (brie, blue, cheddar, gouda, [place], provolone, [place], Swiss, etc), chocolate milk, buttermilk, sour cream, limit eggs and yogurt  - Nuts, peanut butter  - Alcohol  - Cereals/grains:  FRESH breads (fresh bagels, sourdough, doughnuts), yeast productions  - Processed/canned/aged/cured meats (pre-packaged deli meats, hotdogs)  - MSG/glutamate:  soy sauce, flavor enhancer, pickled/preserved/marinated foods  - Sweeteners:  aspartame (Equal, Nutrasweet).  Sugar and Splenda are okay  - Vegetables:  legumes (lima beans, lentils, snow peas, fava beans, pinto peans, peas, garbanzo beans), sauerkraut, onions, olives, pickles  - Fruit:  avocados, bananas, citrus fruit (orange, lemon, grapefruit), mango  - Other:  Frozen meals, macaroni and cheese 5. Routine exercise 6. Stay adequately hydrated (aim for 64 oz water daily) 7. Keep headache diary 8. Maintain proper stress management 9. Maintain proper sleep hygiene 10. Do not skip meals 11. Consider supplements:  magnesium citrate 400mg  daily, riboflavin 400mg  daily, coenzyme Q10 100mg  three times daily. 12. Follow up 4 to 6 months.

## 2020-03-28 DIAGNOSIS — S73191A Other sprain of right hip, initial encounter: Secondary | ICD-10-CM | POA: Diagnosis not present

## 2020-03-28 DIAGNOSIS — M48061 Spinal stenosis, lumbar region without neurogenic claudication: Secondary | ICD-10-CM | POA: Diagnosis not present

## 2020-03-28 DIAGNOSIS — F3341 Major depressive disorder, recurrent, in partial remission: Secondary | ICD-10-CM | POA: Diagnosis not present

## 2020-03-28 DIAGNOSIS — Z79899 Other long term (current) drug therapy: Secondary | ICD-10-CM | POA: Diagnosis not present

## 2020-03-28 DIAGNOSIS — M797 Fibromyalgia: Secondary | ICD-10-CM | POA: Diagnosis not present

## 2020-04-02 DIAGNOSIS — N951 Menopausal and female climacteric states: Secondary | ICD-10-CM | POA: Diagnosis not present

## 2020-04-02 DIAGNOSIS — R635 Abnormal weight gain: Secondary | ICD-10-CM | POA: Diagnosis not present

## 2020-04-02 DIAGNOSIS — G43909 Migraine, unspecified, not intractable, without status migrainosus: Secondary | ICD-10-CM | POA: Diagnosis not present

## 2020-04-02 DIAGNOSIS — F32A Depression, unspecified: Secondary | ICD-10-CM | POA: Diagnosis not present

## 2020-04-02 DIAGNOSIS — R232 Flushing: Secondary | ICD-10-CM | POA: Diagnosis not present

## 2020-04-02 DIAGNOSIS — Z6824 Body mass index (BMI) 24.0-24.9, adult: Secondary | ICD-10-CM | POA: Diagnosis not present

## 2020-04-03 ENCOUNTER — Encounter: Payer: Self-pay | Admitting: Podiatry

## 2020-04-03 ENCOUNTER — Other Ambulatory Visit: Payer: Self-pay | Admitting: Podiatry

## 2020-04-03 NOTE — Progress Notes (Signed)
error 

## 2020-04-04 DIAGNOSIS — R635 Abnormal weight gain: Secondary | ICD-10-CM | POA: Diagnosis not present

## 2020-04-04 DIAGNOSIS — Z1339 Encounter for screening examination for other mental health and behavioral disorders: Secondary | ICD-10-CM | POA: Diagnosis not present

## 2020-04-04 DIAGNOSIS — N951 Menopausal and female climacteric states: Secondary | ICD-10-CM | POA: Diagnosis not present

## 2020-04-04 DIAGNOSIS — E785 Hyperlipidemia, unspecified: Secondary | ICD-10-CM | POA: Diagnosis not present

## 2020-04-04 DIAGNOSIS — Z1331 Encounter for screening for depression: Secondary | ICD-10-CM | POA: Diagnosis not present

## 2020-04-04 DIAGNOSIS — X32XXXS Exposure to sunlight, sequela: Secondary | ICD-10-CM | POA: Diagnosis not present

## 2020-04-04 DIAGNOSIS — L814 Other melanin hyperpigmentation: Secondary | ICD-10-CM | POA: Diagnosis not present

## 2020-04-04 DIAGNOSIS — Z6824 Body mass index (BMI) 24.0-24.9, adult: Secondary | ICD-10-CM | POA: Diagnosis not present

## 2020-04-04 DIAGNOSIS — D229 Melanocytic nevi, unspecified: Secondary | ICD-10-CM | POA: Diagnosis not present

## 2020-04-04 DIAGNOSIS — Z85828 Personal history of other malignant neoplasm of skin: Secondary | ICD-10-CM | POA: Diagnosis not present

## 2020-04-04 DIAGNOSIS — L821 Other seborrheic keratosis: Secondary | ICD-10-CM | POA: Diagnosis not present

## 2020-04-04 DIAGNOSIS — L570 Actinic keratosis: Secondary | ICD-10-CM | POA: Diagnosis not present

## 2020-04-08 DIAGNOSIS — F4310 Post-traumatic stress disorder, unspecified: Secondary | ICD-10-CM | POA: Diagnosis not present

## 2020-04-12 DIAGNOSIS — M19011 Primary osteoarthritis, right shoulder: Secondary | ICD-10-CM | POA: Diagnosis not present

## 2020-04-12 DIAGNOSIS — M25511 Pain in right shoulder: Secondary | ICD-10-CM | POA: Diagnosis not present

## 2020-04-15 DIAGNOSIS — M25611 Stiffness of right shoulder, not elsewhere classified: Secondary | ICD-10-CM | POA: Diagnosis not present

## 2020-04-15 DIAGNOSIS — M25511 Pain in right shoulder: Secondary | ICD-10-CM | POA: Diagnosis not present

## 2020-04-15 DIAGNOSIS — M6281 Muscle weakness (generalized): Secondary | ICD-10-CM | POA: Diagnosis not present

## 2020-04-15 DIAGNOSIS — S43431D Superior glenoid labrum lesion of right shoulder, subsequent encounter: Secondary | ICD-10-CM | POA: Diagnosis not present

## 2020-04-16 ENCOUNTER — Ambulatory Visit (INDEPENDENT_AMBULATORY_CARE_PROVIDER_SITE_OTHER): Payer: PPO | Admitting: Podiatry

## 2020-04-16 ENCOUNTER — Other Ambulatory Visit: Payer: Self-pay

## 2020-04-16 DIAGNOSIS — M2142 Flat foot [pes planus] (acquired), left foot: Secondary | ICD-10-CM | POA: Diagnosis not present

## 2020-04-16 DIAGNOSIS — M2141 Flat foot [pes planus] (acquired), right foot: Secondary | ICD-10-CM

## 2020-04-16 DIAGNOSIS — M205X9 Other deformities of toe(s) (acquired), unspecified foot: Secondary | ICD-10-CM

## 2020-04-16 NOTE — Patient Instructions (Signed)

## 2020-04-17 NOTE — Progress Notes (Signed)
Subjective: Sharon Logan is a 64 y.o. is seen today in office s/p right first MPJ arthroplasty with implant preformed on 12/13/2019.  She states that the surgical standpoint she is doing well.  She does not have any pain to the surgical site of the foot other than she is starting to get some heel pain, plantar fasciitis on both sides.  She is asking for the stretching exercise worksheet.  She has no other concerns.  No recent injury or trauma or changes otherwise.  Objective: General: No acute distress, AAOx3  DP/PT pulses palpable 2/4, CRT < 3 sec to all digits.  Protective sensation intact. Motor function intact.  Right foot: Incision is well coapted without any evidence of dehiscence and scar is well formed.  Improved range of motion of first MPJ although some restriction.  There is no pain with range of motion or to the second metatarsal.  Minimal discomfort on the plantar medial tubercle of the calcaneus at the insertion of plantar fascial.  No pain with lateral compression of calcaneus.  No edema, erythema.  No pain to the Achilles tendon. MMT 5/5.  No other open lesions or pre-ulcerative lesions.  No pain with calf compression, swelling, warmth, erythema.   Assessment and Plan:  Status post right foot surgery, second MPJ capsulitis; plan fasciitis  -Treatment options discussed including all alternatives, risks, and complications -Overall she is doing well from a surgical standpoint.  We will discharge her in the postoperative care and she agrees with this plan.  I did give her instruction exercises for plantar fasciitis and encouraged stretching, icing daily.  If not presenting to let me know if there is any other issues.  Otherwise I will see her back as needed.  Trula Slade DPM

## 2020-04-19 DIAGNOSIS — M25611 Stiffness of right shoulder, not elsewhere classified: Secondary | ICD-10-CM | POA: Diagnosis not present

## 2020-04-19 DIAGNOSIS — S43431D Superior glenoid labrum lesion of right shoulder, subsequent encounter: Secondary | ICD-10-CM | POA: Diagnosis not present

## 2020-04-19 DIAGNOSIS — M25511 Pain in right shoulder: Secondary | ICD-10-CM | POA: Diagnosis not present

## 2020-04-19 DIAGNOSIS — M6281 Muscle weakness (generalized): Secondary | ICD-10-CM | POA: Diagnosis not present

## 2020-04-22 DIAGNOSIS — M25611 Stiffness of right shoulder, not elsewhere classified: Secondary | ICD-10-CM | POA: Diagnosis not present

## 2020-04-22 DIAGNOSIS — S43431D Superior glenoid labrum lesion of right shoulder, subsequent encounter: Secondary | ICD-10-CM | POA: Diagnosis not present

## 2020-04-22 DIAGNOSIS — M25511 Pain in right shoulder: Secondary | ICD-10-CM | POA: Diagnosis not present

## 2020-04-22 DIAGNOSIS — M6281 Muscle weakness (generalized): Secondary | ICD-10-CM | POA: Diagnosis not present

## 2020-04-25 DIAGNOSIS — S43431D Superior glenoid labrum lesion of right shoulder, subsequent encounter: Secondary | ICD-10-CM | POA: Diagnosis not present

## 2020-04-25 DIAGNOSIS — M25511 Pain in right shoulder: Secondary | ICD-10-CM | POA: Diagnosis not present

## 2020-04-25 DIAGNOSIS — M25611 Stiffness of right shoulder, not elsewhere classified: Secondary | ICD-10-CM | POA: Diagnosis not present

## 2020-04-25 DIAGNOSIS — M6281 Muscle weakness (generalized): Secondary | ICD-10-CM | POA: Diagnosis not present

## 2020-04-26 DIAGNOSIS — M48061 Spinal stenosis, lumbar region without neurogenic claudication: Secondary | ICD-10-CM | POA: Diagnosis not present

## 2020-04-26 DIAGNOSIS — Z79899 Other long term (current) drug therapy: Secondary | ICD-10-CM | POA: Diagnosis not present

## 2020-04-26 DIAGNOSIS — M85851 Other specified disorders of bone density and structure, right thigh: Secondary | ICD-10-CM | POA: Diagnosis not present

## 2020-04-26 DIAGNOSIS — M797 Fibromyalgia: Secondary | ICD-10-CM | POA: Diagnosis not present

## 2020-04-26 DIAGNOSIS — F3341 Major depressive disorder, recurrent, in partial remission: Secondary | ICD-10-CM | POA: Diagnosis not present

## 2020-04-30 ENCOUNTER — Encounter: Payer: Self-pay | Admitting: Psychiatry

## 2020-04-30 ENCOUNTER — Other Ambulatory Visit: Payer: Self-pay

## 2020-04-30 ENCOUNTER — Ambulatory Visit (INDEPENDENT_AMBULATORY_CARE_PROVIDER_SITE_OTHER): Payer: PPO | Admitting: Psychiatry

## 2020-04-30 DIAGNOSIS — M25511 Pain in right shoulder: Secondary | ICD-10-CM | POA: Diagnosis not present

## 2020-04-30 DIAGNOSIS — M6281 Muscle weakness (generalized): Secondary | ICD-10-CM | POA: Diagnosis not present

## 2020-04-30 DIAGNOSIS — S43431D Superior glenoid labrum lesion of right shoulder, subsequent encounter: Secondary | ICD-10-CM | POA: Diagnosis not present

## 2020-04-30 DIAGNOSIS — F4310 Post-traumatic stress disorder, unspecified: Secondary | ICD-10-CM | POA: Diagnosis not present

## 2020-04-30 DIAGNOSIS — M25611 Stiffness of right shoulder, not elsewhere classified: Secondary | ICD-10-CM | POA: Diagnosis not present

## 2020-04-30 MED ORDER — CLONIDINE HCL 0.1 MG PO TABS: ORAL_TABLET | ORAL | 2 refills | Status: DC

## 2020-04-30 NOTE — Progress Notes (Signed)
Sharon Logan [id number] 27-Jul-1955 64 y.o.  Subjective:   Patient ID:  Sharon Logan is a 64 y.o. (DOB 08/28/1955) female.  Chief Complaint:  Chief Complaint  Patient presents with  . Other    Excessive sweating  . Follow-up    h/o Depression and anxiety    HPI Sharon Logan presents to the office today for follow-up of depression, anxiety, and excessive sweating.  She was last seen 05/16/18. She reports that Lexapro seems to be briefly helpful for her mood and then is no longer as effective.  She reports that she was taken off of Lexapro and started on Effexor XR to possibly improve excessive sweating. She was on Effexor XR, primarily for hot flashes, and this helped for awhile with hot flashes and then was not helping with her mood. She reports that it was becoming a struggle to get out of bed.  She then came off of Effexor XR. She reports, "I think I have been pretty good."   "My mood overall is good." Anticipates depression likely returning. She reports that her anxiety has been well controlled recently. She reports that her sleep is "lousy" and awakens every 4 hours. Denies nightmares. Appetite has been good. Energy is low and has recently started taking Vit B 12 injections and notices some improvement in energy. Motivation have been good. She reports that her concentration has been worse since the start of menopause. Denies SI.   Patient she reports that she has been having excessive sweating for about 10 years. She reports that this coincided with the onset of menopause. She reports that she feels cold most of the time despite sweats.   Past Medication Trials: Lexapro- "My anti-depressant of choice" and gives her energy. Was taking 2-3 years prior to surgery. Re-started one week ago. Has taken 10 mg and 20 mg doses in the past. Thinks it may have helped with pain in the past.  Cymbalta- Started after back surgery. Has made her tired, even when reduced to 30 mg. Was having to take 2  hour naps. May have helped slightly with pain. Had HA, Constipation Prozac- Constipation. Causes her to feel cold.  Celexa Lexapro Zoloft- worsening mood Effexor-HA, helped with excessive sweating.  Paxil Wellbutrin- Did not cause HA's. Tolerated well and was effective for depression.  Amitriptyline- Did not cause HA's. Weight gain Nortriptyline- Did not cause HA's. Weight gain Lamictal- Rash Gabapentin -Rhabomyolysis Lyrica- Adverse reaction Buspar Trazodone Ambien- Parasomnia Lunesta- Tolerated  Reports that many medications caused her to have HA.  Never taken Pristiq, Trintellix, Brintellix, Fetzima, Remeron, Viibryd, Seroquel, abilify Rexulti  Review of Systems:  Review of Systems  Constitutional: Positive for diaphoresis.  Musculoskeletal: Negative for gait problem.  Allergic/Immunologic: Positive for environmental allergies.  Psychiatric/Behavioral:       Please refer to HPI    Medications: I have reviewed the patient's current medications.  Current Outpatient Medications  Medication Sig Dispense Refill  . baclofen (LIORESAL) 10 MG tablet Take 10 mg by mouth every 8 (eight) hours as needed.     . Carboxymethylcellulose Sodium (THERATEARS OP) Apply 1 drop to eye daily as needed (dry eyes).    . cetirizine (ZYRTEC) 10 MG tablet Take 10 mg by mouth daily.    . diclofenac Sodium (VOLTAREN) 1 % GEL     . fluticasone (FLONASE) 50 MCG/ACT nasal spray Place 2 sprays into both nostrils 2 (two) times daily as needed for allergies or rhinitis.    Marland Kitchen HYDROcodone-acetaminophen (NORCO/VICODIN) 5-325  MG tablet Take 1-2 tablets by mouth every 6 (six) hours as needed. 20 tablet 0  . montelukast (SINGULAIR) 10 MG tablet     . olopatadine (PATANOL) 0.1 % ophthalmic solution Place 1 drop into both eyes 2 (two) times daily as needed for allergies.     Marland Kitchen OVER THE COUNTER MEDICATION Take 2 capsules by mouth 2 (two) times daily as needed (allergies). Sinus Support EF    . rizatriptan  (MAXALT) 10 MG tablet Take 10 mg by mouth daily as needed for migraine.     Marland Kitchen VITAMIN D PO Take 4 capsules by mouth daily.    . cloNIDine (CATAPRES) 0.1 MG tablet Take 1/2 tablet at bedtime. May increase to twice daily as tolerated 30 tablet 2  . Erenumab-aooe (AIMOVIG) 70 MG/ML SOAJ Inject 70 mg into the skin every 28 (twenty-eight) days. (Patient not taking: Reported on 04/30/2020) 1.12 mL 5  . HYDROcodone-acetaminophen (NORCO/VICODIN) 5-325 MG tablet     . lidocaine (LIDODERM) 5 % 1 patch daily. (Patient not taking: Reported on 04/30/2020)    . mometasone (ELOCON) 0.1 % cream Apply topically 2 (two) times daily. (Patient not taking: Reported on 04/30/2020)    . progesterone (PROMETRIUM) 200 MG capsule Take 200 mg by mouth at bedtime. (Patient not taking: Reported on 04/30/2020)     No current facility-administered medications for this visit.    Medication Side Effects: Other: N/A  Allergies:  Allergies  Allergen Reactions  . Gabapentin Other (See Comments)  . Polymyxin B Other (See Comments)    Eyes go blood red  . Cymbalta [Duloxetine Hcl] Other (See Comments)    Headaches, constipation  . Duloxetine Other (See Comments)    HEADACHES CONSTIPATION  . Lamotrigine Rash  . Other Other (See Comments)    OTOBIOTIC > RED EYES  . Pregabalin Rash    Itchy red rash on chest  . Prozac [Fluoxetine] Other (See Comments)    CONSTIPATION  . Zolpidem Rash    "sleep walk"    Past Medical History:  Diagnosis Date  . Allergic rhinitis   . Anxiety   . Arthritis   . Cancer ([place])    skin  . Chronic pain   . Chronically dry eyes   . Depression   . Fibromyalgia   . Headache   . Vitamin D deficiency   . Wears glasses     Family History  Problem Relation Age of Onset  . Depression Mother   . Heart attack Mother   . COPD Mother   . Depression Father   . Heart attack Father   . ADD / ADHD Son     Social History   Socioeconomic History  . Marital status: Widowed    Spouse  name: Not on file  . Number of children: Not on file  . Years of education: Not on file  . Highest education level: Not on file  Occupational History  . Not on file  Tobacco Use  . Smoking status: Never Smoker  . Smokeless tobacco: Never Used  Vaping Use  . Vaping Use: Never used  Substance and Sexual Activity  . Alcohol use: No  . Drug use: No  . Sexual activity: Not on file  Other Topics Concern  . Not on file  Social History Narrative   Right handed   Social Determinants of Health   Financial Resource Strain:   . Difficulty of Paying Living Expenses: Not on file  Food Insecurity:   . Worried About  Running Out of Food in the Last Year: Not on file  . Ran Out of Food in the Last Year: Not on file  Transportation Needs:   . Lack of Transportation (Medical): Not on file  . Lack of Transportation (Non-Medical): Not on file  Physical Activity:   . Days of Exercise per Week: Not on file  . Minutes of Exercise per Session: Not on file  Stress:   . Feeling of Stress : Not on file  Social Connections:   . Frequency of Communication with Friends and Family: Not on file  . Frequency of Social Gatherings with Friends and Family: Not on file  . Attends Religious Services: Not on file  . Active Member of Clubs or Organizations: Not on file  . Attends Archivist Meetings: Not on file  . Marital Status: Not on file  Intimate Partner Violence:   . Fear of Current or Ex-Partner: Not on file  . Emotionally Abused: Not on file  . Physically Abused: Not on file  . Sexually Abused: Not on file    Past Medical History, Surgical history, Social history, and Family history were reviewed and updated as appropriate.   Please see review of systems for further details on the patient's review from today.   Objective:   Physical Exam:  There were no vitals taken for this visit.  Physical Exam Constitutional:      General: She is not in acute distress. Musculoskeletal:         General: No deformity.  Neurological:     Mental Status: She is alert and oriented to person, place, and time.     Coordination: Coordination normal.  Psychiatric:        Attention and Perception: Attention and perception normal. She does not perceive auditory or visual hallucinations.        Mood and Affect: Mood normal. Mood is not anxious or depressed. Affect is not labile, blunt, angry or inappropriate.        Speech: Speech normal.        Behavior: Behavior normal.        Thought Content: Thought content normal. Thought content is not paranoid or delusional. Thought content does not include homicidal or suicidal ideation. Thought content does not include homicidal or suicidal plan.        Cognition and Memory: Cognition and memory normal.        Judgment: Judgment normal.     Comments: Insight intact     Lab Review:     Component Value Date/Time   NA 138 06/29/2019 1156   K 3.5 06/29/2019 1156   CL 106 06/26/2019 1008   CO2 24 06/26/2019 1008   GLUCOSE 98 06/26/2019 1008   BUN 20 06/26/2019 1008   CREATININE 0.84 06/26/2019 1008   CALCIUM 9.6 06/26/2019 1008   PROT 6.9 06/26/2019 1008   ALBUMIN 4.0 06/26/2019 1008   AST 22 06/26/2019 1008   ALT 15 06/26/2019 1008   ALKPHOS 75 06/26/2019 1008   BILITOT 0.5 06/26/2019 1008   GFRNONAA >60 06/26/2019 1008   GFRAA >60 06/26/2019 1008       Component Value Date/Time   WBC 6.3 06/26/2019 1008   RBC 4.45 06/26/2019 1008   HGB 9.5 (L) 06/29/2019 1156   HCT 28.0 (L) 06/29/2019 1156   PLT 269 06/26/2019 1008   MCV 95.5 06/26/2019 1008   MCH 30.1 06/26/2019 1008   MCHC 31.5 06/26/2019 1008   RDW 12.6 06/26/2019 1008  LYMPHSABS 1.9 06/26/2019 1008   MONOABS 0.4 06/26/2019 1008   EOSABS 0.1 06/26/2019 1008   BASOSABS 0.0 06/26/2019 1008    No results found for: POCLITH, LITHIUM   No results found for: PHENYTOIN, PHENOBARB, VALPROATE, CBMZ   .res Assessment: Plan:   Patient reports that she prefers not to restart  an antidepressant at this time and would prefer to be followed closely to monitor for signs and symptoms of recurrent symptoms.  She asks about possible treatment options to reduce excessive sweating and reports that multiple SSRI's and SNRI's have not been effective for her excessive sweating.  Discussed that gabapentin and Lyrica are occasionally used for excessive sweating, however she has had significant adverse reactions to both of these medications in the past. Discussed that some studies indicate that clonidine may be helpful for excessive sweating and that this is an off label indication.  Discussed potential benefits, risks, and side effects of clonidine.  Will start clonidine 0.1 mg 1/2 tablet at bedtime with plan to increase to half tablet twice daily as tolerated. Patient to follow-up in 2 months or sooner if clinically indicated. Patient advised to contact office with any questions, adverse effects, or acute worsening in signs and symptoms.  Sharon Logan was seen today for other and follow-up.  Diagnoses and all orders for this visit:  PTSD (post-traumatic stress disorder) -     cloNIDine (CATAPRES) 0.1 MG tablet; Take 1/2 tablet at bedtime. May increase to twice daily as tolerated     Please see After Visit Summary for patient specific instructions.  Future Appointments  Date Time Provider [place]  06/03/2020 10:00 AM Bauert, Nicolasa Ducking, LCSW [facility] None  07/02/2020  2:30 PM Thayer Headings, PMHNP [facility] None  09/24/2020  2:30 PM Pieter Partridge, DO [facility] None    No orders of the defined types were placed in this encounter.   -------------------------------

## 2020-05-03 DIAGNOSIS — M25511 Pain in right shoulder: Secondary | ICD-10-CM | POA: Diagnosis not present

## 2020-05-03 DIAGNOSIS — S43431D Superior glenoid labrum lesion of right shoulder, subsequent encounter: Secondary | ICD-10-CM | POA: Diagnosis not present

## 2020-05-03 DIAGNOSIS — M6281 Muscle weakness (generalized): Secondary | ICD-10-CM | POA: Diagnosis not present

## 2020-05-03 DIAGNOSIS — M25611 Stiffness of right shoulder, not elsewhere classified: Secondary | ICD-10-CM | POA: Diagnosis not present

## 2020-05-07 DIAGNOSIS — M25511 Pain in right shoulder: Secondary | ICD-10-CM | POA: Diagnosis not present

## 2020-05-07 DIAGNOSIS — M6281 Muscle weakness (generalized): Secondary | ICD-10-CM | POA: Diagnosis not present

## 2020-05-07 DIAGNOSIS — M25611 Stiffness of right shoulder, not elsewhere classified: Secondary | ICD-10-CM | POA: Diagnosis not present

## 2020-05-07 DIAGNOSIS — S43431D Superior glenoid labrum lesion of right shoulder, subsequent encounter: Secondary | ICD-10-CM | POA: Diagnosis not present

## 2020-05-08 DIAGNOSIS — M19011 Primary osteoarthritis, right shoulder: Secondary | ICD-10-CM | POA: Diagnosis not present

## 2020-05-14 DIAGNOSIS — R109 Unspecified abdominal pain: Secondary | ICD-10-CM | POA: Diagnosis not present

## 2020-05-14 DIAGNOSIS — S73191A Other sprain of right hip, initial encounter: Secondary | ICD-10-CM | POA: Diagnosis not present

## 2020-05-20 DIAGNOSIS — R3129 Other microscopic hematuria: Secondary | ICD-10-CM | POA: Diagnosis not present

## 2020-05-22 DIAGNOSIS — M25311 Other instability, right shoulder: Secondary | ICD-10-CM | POA: Diagnosis not present

## 2020-05-23 ENCOUNTER — Ambulatory Visit (INDEPENDENT_AMBULATORY_CARE_PROVIDER_SITE_OTHER): Payer: PPO | Admitting: Podiatry

## 2020-05-23 ENCOUNTER — Other Ambulatory Visit: Payer: Self-pay

## 2020-05-23 ENCOUNTER — Ambulatory Visit (INDEPENDENT_AMBULATORY_CARE_PROVIDER_SITE_OTHER): Payer: PPO

## 2020-05-23 DIAGNOSIS — M79671 Pain in right foot: Secondary | ICD-10-CM

## 2020-05-23 DIAGNOSIS — M779 Enthesopathy, unspecified: Secondary | ICD-10-CM | POA: Diagnosis not present

## 2020-05-23 MED ORDER — TRIAMCINOLONE ACETONIDE 10 MG/ML IJ SUSP
10.0000 mg | Freq: Once | INTRAMUSCULAR | Status: AC
  Administered 2020-05-23: 10 mg

## 2020-05-24 DIAGNOSIS — M533 Sacrococcygeal disorders, not elsewhere classified: Secondary | ICD-10-CM | POA: Diagnosis not present

## 2020-05-24 DIAGNOSIS — M545 Low back pain, unspecified: Secondary | ICD-10-CM | POA: Diagnosis not present

## 2020-05-27 DIAGNOSIS — M797 Fibromyalgia: Secondary | ICD-10-CM | POA: Diagnosis not present

## 2020-05-27 DIAGNOSIS — R945 Abnormal results of liver function studies: Secondary | ICD-10-CM | POA: Diagnosis not present

## 2020-05-27 DIAGNOSIS — Z79899 Other long term (current) drug therapy: Secondary | ICD-10-CM | POA: Diagnosis not present

## 2020-05-27 DIAGNOSIS — R252 Cramp and spasm: Secondary | ICD-10-CM | POA: Diagnosis not present

## 2020-05-27 DIAGNOSIS — M48061 Spinal stenosis, lumbar region without neurogenic claudication: Secondary | ICD-10-CM | POA: Diagnosis not present

## 2020-05-28 NOTE — Progress Notes (Signed)
Subjective: Sharon Logan is a 64 y.o. is seen today in office for concerns of discomfort to the top of her right foot.  She will occasionally get some shooting pain from the top of her foot into the first and second toes.  She denies any recent injury or trauma.  The surgical site itself are doing well.  She has no other concerns today.  No significant increase in swelling.  There is no fevers, chills, nausea, vomiting.  No calf pain, chest pain, shortness of breath.  Objective: General: No acute distress, AAOx3  DP/PT pulses palpable 2/4, CRT < 3 sec to all digits.  Protective sensation intact. Motor function intact.  Right foot: Incision from the surgery are well-healed.  There is decreased range of motion of first IPJ but there is no pain in this area.  The majority discomfort is on the dorsal medial aspect of the midfoot on the area of the bone spur.  This is also on the area and superficial peroneal nerve.  There is minimal edema to this area there is no erythema or warmth.  There is no skin breakdown. No other open lesions or pre-ulcerative lesions.  No pain with calf compression, swelling, warmth, erythema.   Assessment and Plan:  Exostosis/tendinitis resulting neuritis  -Treatment options discussed including all alternatives, risks, and complications -X-rays obtained reviewed.  No evidence of acute fracture.  Implant intact the first MPJ. -Today steroid injection was performed on the area of the bone spur, tenderness.  The skin was cleaned with alcohol and mixture of 1 cc Kenalog 10, 0.5 cc of Marcaine plain, 0.5 cc of lidocaine plain was infiltrated into the area of maximal tenderness without complications.  Postinjection care discussed.  She tolerated well.  Discussed offloading and also replacing her shoes to avoid pressure.  As I do think that the bone spurs putting pressure on the nerve causing discomfort.  Trula Slade DPM

## 2020-06-03 ENCOUNTER — Ambulatory Visit (INDEPENDENT_AMBULATORY_CARE_PROVIDER_SITE_OTHER): Admitting: Psychology

## 2020-06-03 DIAGNOSIS — F4321 Adjustment disorder with depressed mood: Secondary | ICD-10-CM | POA: Diagnosis not present

## 2020-06-05 DIAGNOSIS — M48061 Spinal stenosis, lumbar region without neurogenic claudication: Secondary | ICD-10-CM | POA: Diagnosis not present

## 2020-06-11 ENCOUNTER — Other Ambulatory Visit: Payer: Self-pay | Admitting: Orthopedic Surgery

## 2020-06-11 DIAGNOSIS — G8929 Other chronic pain: Secondary | ICD-10-CM

## 2020-06-13 DIAGNOSIS — F4310 Post-traumatic stress disorder, unspecified: Secondary | ICD-10-CM | POA: Diagnosis not present

## 2020-06-20 ENCOUNTER — Ambulatory Visit (INDEPENDENT_AMBULATORY_CARE_PROVIDER_SITE_OTHER): Payer: Medicare Other | Admitting: Podiatry

## 2020-06-20 ENCOUNTER — Other Ambulatory Visit: Payer: Self-pay

## 2020-06-20 DIAGNOSIS — M79671 Pain in right foot: Secondary | ICD-10-CM | POA: Diagnosis not present

## 2020-06-20 DIAGNOSIS — S99921A Unspecified injury of right foot, initial encounter: Secondary | ICD-10-CM | POA: Diagnosis not present

## 2020-06-20 DIAGNOSIS — M778 Other enthesopathies, not elsewhere classified: Secondary | ICD-10-CM

## 2020-06-20 DIAGNOSIS — M779 Enthesopathy, unspecified: Secondary | ICD-10-CM

## 2020-06-20 MED ORDER — TRIAMCINOLONE ACETONIDE 10 MG/ML IJ SUSP
10.0000 mg | Freq: Once | INTRAMUSCULAR | Status: AC
  Administered 2020-06-20: 10 mg

## 2020-06-27 NOTE — Progress Notes (Signed)
Subjective: 65 year old female presents to the office today for follow-up evaluation of right foot pain.  She is injection did help previously and she points to the second MPJ where she gets majority discomfort.  Also recently she did break off the tip of her right big toenail.  Denies any drainage or pus any swelling.  She has no other concerns today. Denies any systemic complaints such as fevers, chills, nausea, vomiting. No acute changes since last appointment, and no other complaints at this time.   Objective: AAO x3, NAD DP/PT pulses palpable bilaterally, CRT less than 3 seconds There is significant discomfort with first MPJ range of motion although restriction is noted.  The majority tenderness is on the second MPJ.  There is minimal edema there is no erythema or warmth.  No area of pinpoint tenderness.  The right hallux toenail did split at the distal aspect and there is no dried blood present there is no edema, erythema or signs of infection.  No open lesions.  MMT 5/5. No pain with calf compression, swelling, warmth, erythema  Assessment: Capsulitis right foot, toenail injury right hallux  Plan: -All treatment options discussed with the patient including all alternatives, risks, complications.  -Steroid injection performed of the second MPJ.  Skin was prepped with alcohol and a mixture of 1 cc Kenalog 10, 0.5 cc of Marcaine plain, 0.5 cc of lidocaine plain was infiltrated around the second MPJ without complications.  Postinjection care discussed.  She tolerated well.  Offloading pads dispensed.  Hopefully as the inflammation comes she can start to wear orthotics more as she is not able to wear them because they are hurting the area. -Recommend antibiotic ointment to the right hallux toenail daily. -Patient encouraged to call the office with any questions, concerns, change in symptoms.   Trula Slade DPM

## 2020-06-28 ENCOUNTER — Other Ambulatory Visit: Payer: PPO

## 2020-06-28 ENCOUNTER — Other Ambulatory Visit: Payer: Self-pay

## 2020-06-28 ENCOUNTER — Ambulatory Visit
Admission: RE | Admit: 2020-06-28 | Discharge: 2020-06-28 | Disposition: A | Discharge status: Home / Self Care | Payer: Federal, State, Local not specified - PPO | Source: Ambulatory Visit | Attending: Orthopedic Surgery | Admitting: Orthopedic Surgery

## 2020-06-28 ENCOUNTER — Ambulatory Visit
Admission: RE | Admit: 2020-06-28 | Discharge: 2020-06-28 | Disposition: A | Discharge status: Home / Self Care | Payer: Medicare Other | Source: Ambulatory Visit | Attending: Orthopedic Surgery | Admitting: Orthopedic Surgery

## 2020-06-28 DIAGNOSIS — G8929 Other chronic pain: Secondary | ICD-10-CM

## 2020-06-28 DIAGNOSIS — M533 Sacrococcygeal disorders, not elsewhere classified: Secondary | ICD-10-CM

## 2020-06-28 IMAGING — CT CT BIOPSY
2 of 6 series · 12 of 32 positions shown, 18 images · non-contrast
Comparison: none

CLINICAL DATA: Bilateral chronic sacroiliac pain. Pain across the
low back extending buttocks bilaterally.

[Series 2: needle -guided injection · axial · 0.73mm/px · z∈[-139,-63]mm · 10 of 48 slices shown, 16 images (1 of 2)]
[im 5/48  soft-tissue]
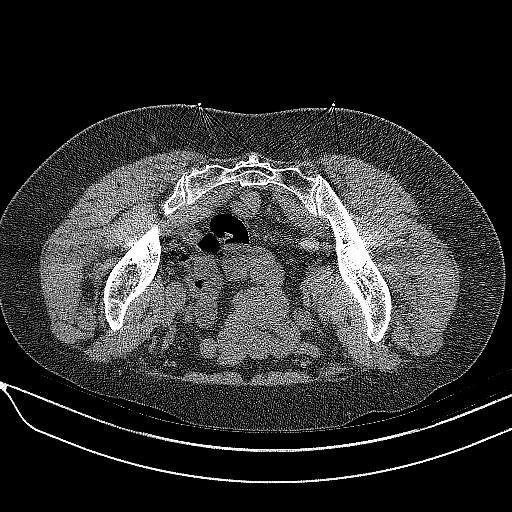
[im 5/48  bone]
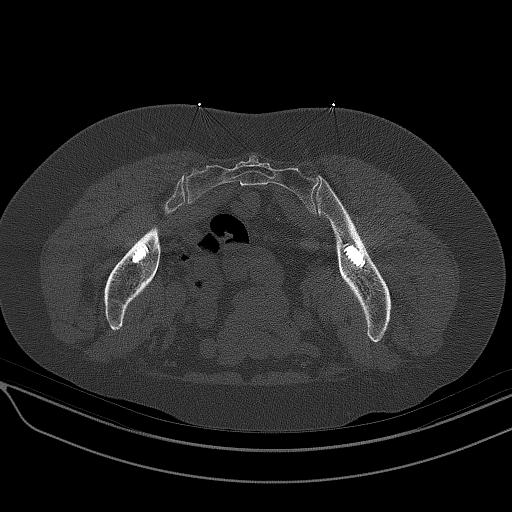
[im 9/48  soft-tissue]
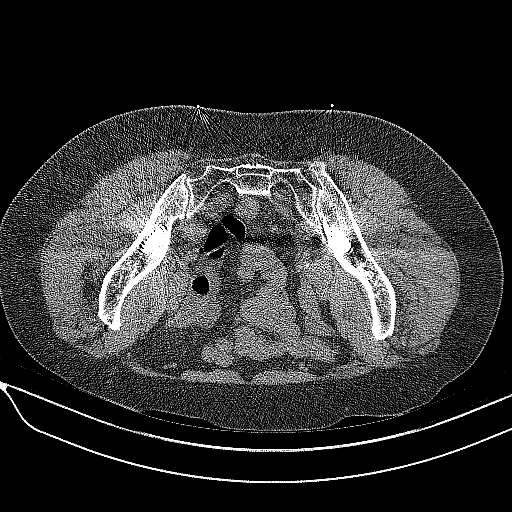
[im 13/48  soft-tissue]
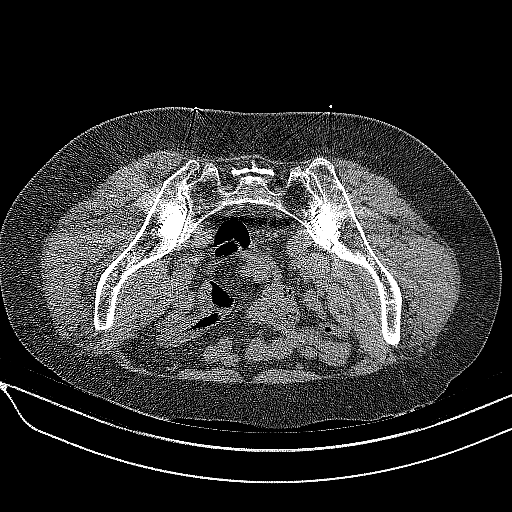
[im 18/48  soft-tissue]
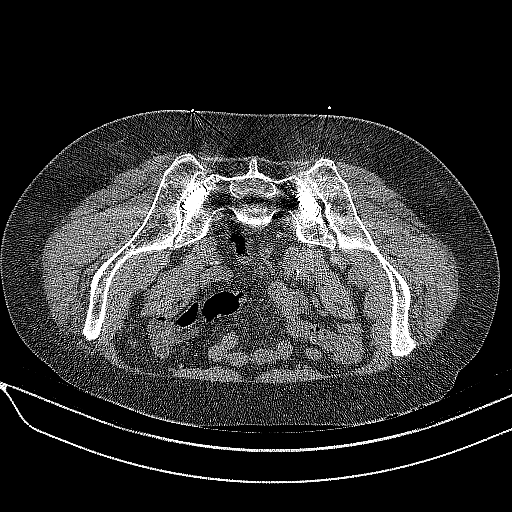
[im 22/48  soft-tissue]
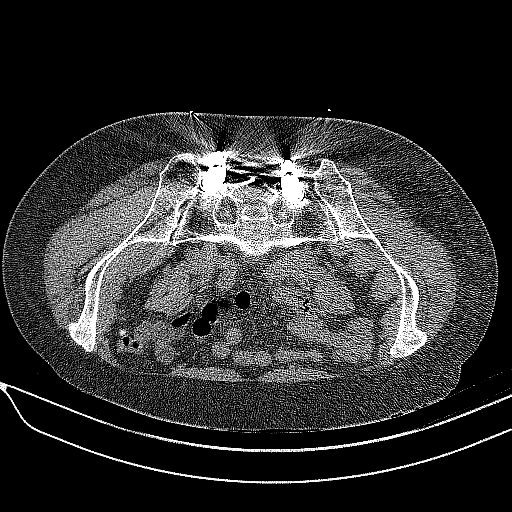
[im 26/48  soft-tissue]
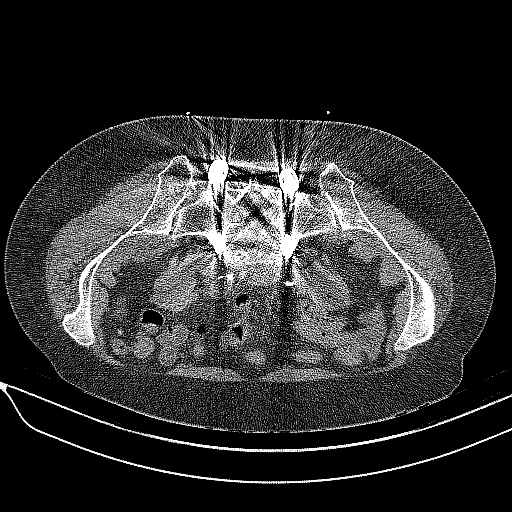
[im 30/48  soft-tissue]
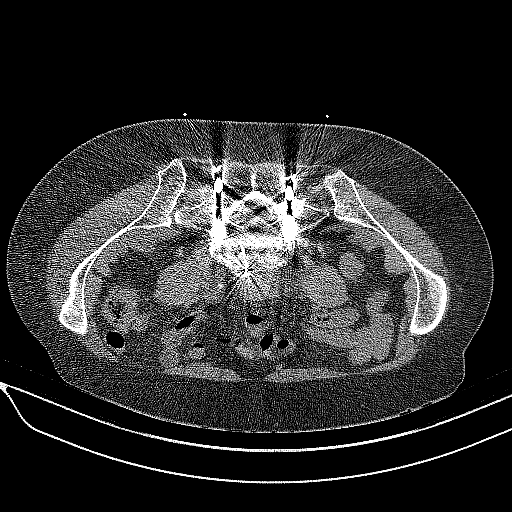
[im 30/48  lung]
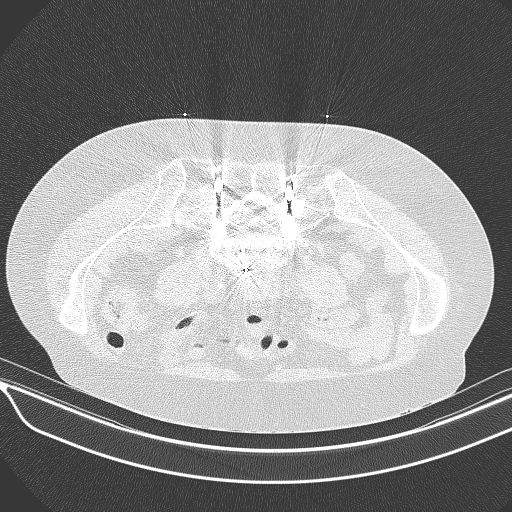
[im 35/48  soft-tissue]
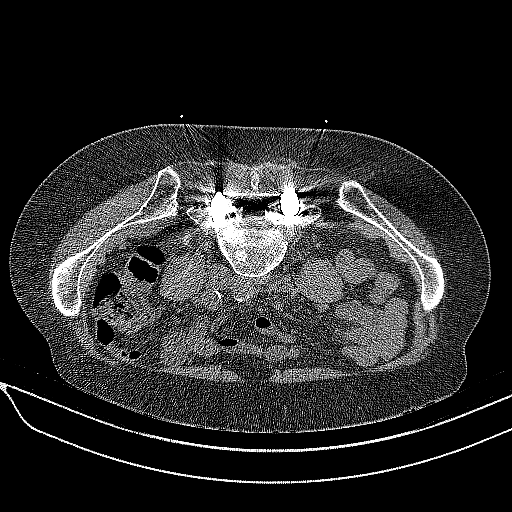
[im 35/48  lung]
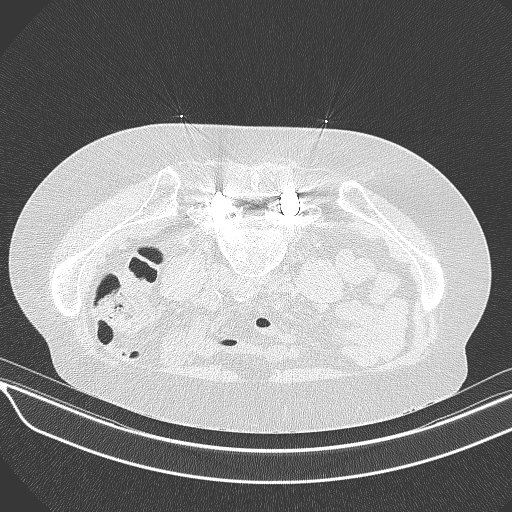
[im 39/48  soft-tissue]
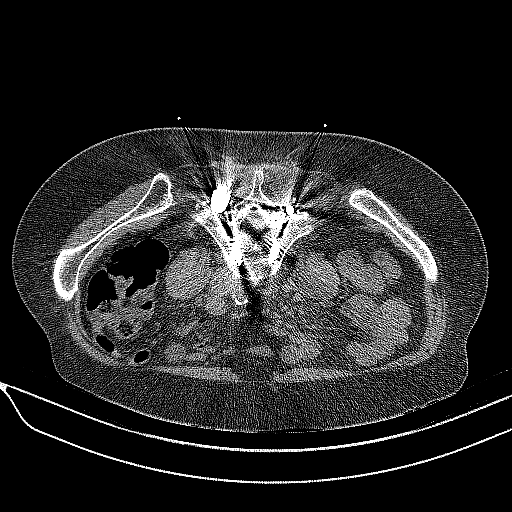
[im 39/48  lung]
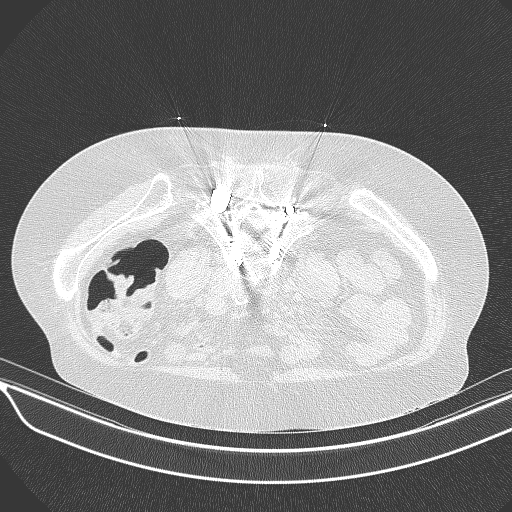
[im 39/48  bone]
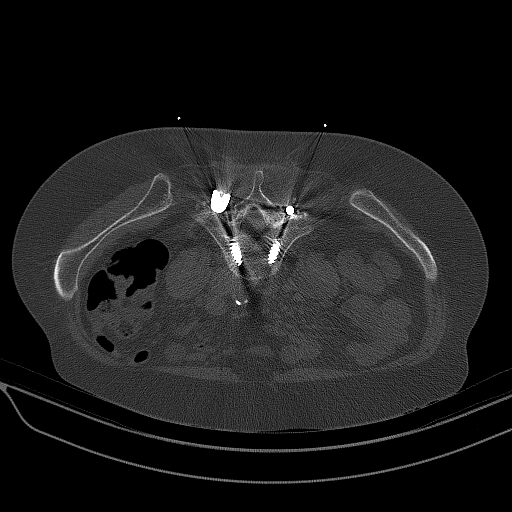
[im 43/48  soft-tissue]
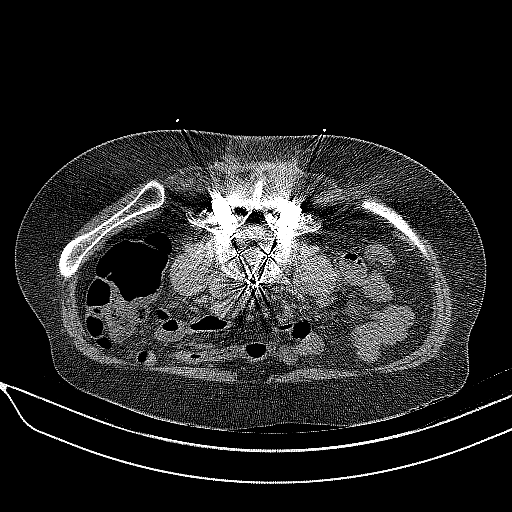
[im 43/48  lung]
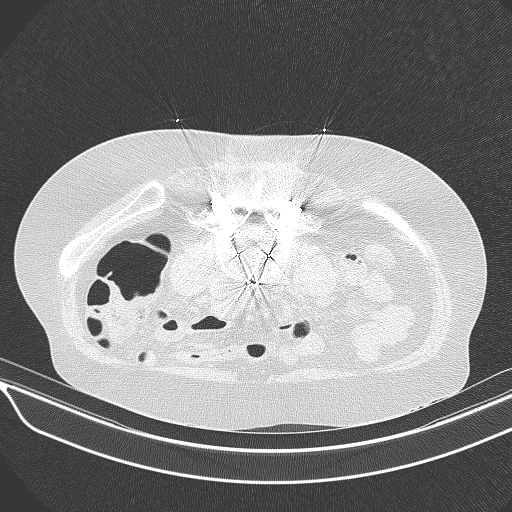

[Series 3: needle -guided injection · axial · 0.73mm/px · z∈[-139,-129]mm · 2 of 15 slices shown (2 of 2)]
[im 5/15  soft-tissue]
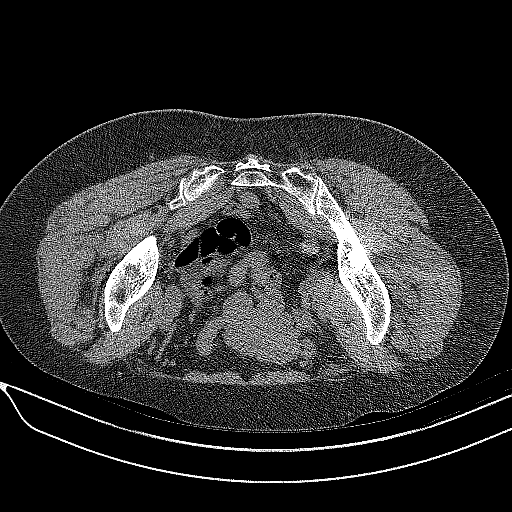
[im 10/15  soft-tissue]
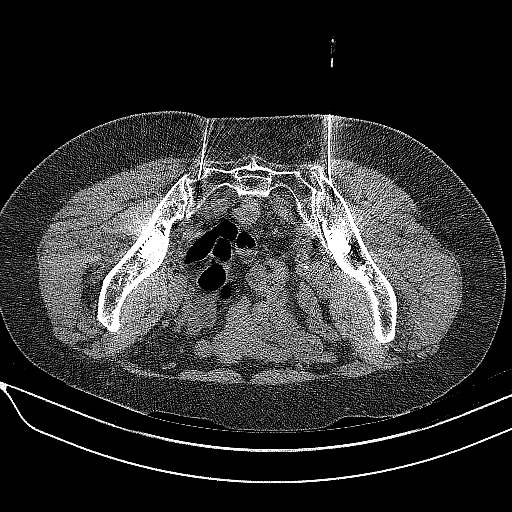

[12 of 32 positions shown; findings below may reference images not displayed]

EXAM:
Bilateral CT GUIDED SI JOINT INJECTION



After local anesthesia with 1% lidocaine without epinephrine and
subsequent deep anesthesia, 22 spinal needles were advanced into the
SI joints bilaterally under intermittent CT guidance.

Once the needle was in satisfactory position, representative image
was captured with the needle demonstrated in the sacroiliac joint.
Subsequently, 1.5 mL 0.5% bupivacaine was injected into the SI
joints bilaterally. Needles were removed and a sterile dressing
applied.

No complications were observed.
IMPRESSION: Successful CT-guided bilateral anesthetic only SI joint injections.

## 2020-07-02 ENCOUNTER — Ambulatory Visit: Payer: PPO | Admitting: Psychiatry

## 2020-07-11 ENCOUNTER — Ambulatory Visit (INDEPENDENT_AMBULATORY_CARE_PROVIDER_SITE_OTHER): Payer: No Typology Code available for payment source | Admitting: Podiatry

## 2020-07-11 ENCOUNTER — Ambulatory Visit (INDEPENDENT_AMBULATORY_CARE_PROVIDER_SITE_OTHER): Payer: Medicare Other

## 2020-07-11 ENCOUNTER — Other Ambulatory Visit: Payer: Self-pay

## 2020-07-11 DIAGNOSIS — M79671 Pain in right foot: Secondary | ICD-10-CM

## 2020-07-11 DIAGNOSIS — M779 Enthesopathy, unspecified: Secondary | ICD-10-CM | POA: Diagnosis not present

## 2020-07-15 NOTE — Progress Notes (Signed)
Subjective: 65 year old female presents to the office today for follow-up evaluation of right foot pain.  She states the injection was helpful and overall she is feeling better.  She does feel the orthotics are causing some discomfort.  She denies any recent injury or changes otherwise as I last saw her she has no other concerns.Denies any systemic complaints such as fevers, chills, nausea, vomiting. No acute changes since last appointment, and no other complaints at this time.   Objective: AAO x3, NAD DP/PT pulses palpable bilaterally, CRT less than 3 seconds There is NO significant discomfort with first MPJ range of motion although restriction is noted.  The area of tenderness that was present on the second MPJ has resolved.  There is no edema, erythema.  No area of pinpoint tenderness.  Flexor, extensor tendons appear to be intact.  MMT 5/5.  No areas of discomfort identified today. Right hallux toenail is doing well without any signs of infection. No pain with calf compression, swelling, warmth, erythema  Assessment: Capsulitis right foot, toenail injury right hallux  Plan: -All treatment options discussed with the patient including all alternatives, risks, complications.  -Overall doing well we held off another injection today.  Upon evaluation of orthotics the U pad underneath the second MPJ is putting pressure on the first MPJ.  I like to have the orthotics modified however our pedorthotist was not available today.  She is in a schedule appointment to follow-up with him again and I am also present so we can further evaluate.  Trula Slade DPM

## 2020-07-16 ENCOUNTER — Ambulatory Visit: Payer: No Typology Code available for payment source | Admitting: Psychology

## 2020-07-16 ENCOUNTER — Ambulatory Visit: Payer: Federal, State, Local not specified - PPO | Admitting: Psychiatry

## 2020-07-17 ENCOUNTER — Other Ambulatory Visit: Payer: Self-pay | Admitting: Orthopedic Surgery

## 2020-07-17 DIAGNOSIS — M545 Low back pain, unspecified: Secondary | ICD-10-CM

## 2020-07-22 ENCOUNTER — Ambulatory Visit: Payer: Federal, State, Local not specified - PPO | Admitting: Psychiatry

## 2020-07-22 ENCOUNTER — Telehealth (INDEPENDENT_AMBULATORY_CARE_PROVIDER_SITE_OTHER): Payer: Medicare Other | Admitting: Psychiatry

## 2020-07-22 ENCOUNTER — Encounter: Payer: Self-pay | Admitting: Psychiatry

## 2020-07-22 ENCOUNTER — Telehealth: Payer: Self-pay | Admitting: Psychiatry

## 2020-07-22 DIAGNOSIS — F4310 Post-traumatic stress disorder, unspecified: Secondary | ICD-10-CM | POA: Diagnosis not present

## 2020-07-22 DIAGNOSIS — F331 Major depressive disorder, recurrent, moderate: Secondary | ICD-10-CM

## 2020-07-22 MED ORDER — ESCITALOPRAM OXALATE 10 MG PO TABS: 10.0000 mg | ORAL_TABLET | Freq: Every day | ORAL | 0 refills | Status: DC

## 2020-07-22 NOTE — Progress Notes (Signed)
Sharon Logan [id number] Jan 30, 1956 65 y.o.  Virtual Visit via Telephone Note  I connected with pt on 07/22/20 at  9:00 AM EST by telephone and verified that I am speaking with the correct person using two identifiers.   I discussed the limitations, risks, security and privacy concerns of performing an evaluation and management service by telephone and the availability of in person appointments. I also discussed with the patient that there may be a patient responsible charge related to this service. The patient expressed understanding and agreed to proceed.   I discussed the assessment and treatment plan with the patient. The patient was provided an opportunity to ask questions and all were answered. The patient agreed with the plan and demonstrated an understanding of the instructions.   The patient was advised to call back or seek an in-person evaluation if the symptoms worsen or if the condition fails to improve as anticipated.  I provided 30 minutes of non-face-to-face time during this encounter.  The patient was located at home.  The provider was located at [place].   Thayer Headings, PMHNP   Subjective:   Patient ID:  Sharon Logan is a 65 y.o. (DOB 1955/09/07) female.  Chief Complaint:  Chief Complaint  Patient presents with  . Depression  . Anxiety    HPI Sharon Logan presents for follow-up of depression and anxiety. Started having severe chronic pain in November and this has impacted her depression. She reports that initially she thought she was doing ok and in January her pain worsened and her depression. She reports, "I would like to get put back on Lexapro." She has also been experiencing increased anxiety. She reports that routine things she would typically do started causing fear and anxiety, such as going on a weekend trip. She denies any irritability. Has noticed some worry. She reports that she has had some worsening PTSD s/s with intrusive memories and  flashbacks. Denies nightmares. She reports that her sleep has been "rotten" and is awakened due to pain. She reports that she is typically able to fall asleep without difficulty. Denies any panic s/s. She reports that her appetite is "still good." She has intentionally been drinking less soda. She reports that she has not been enjoying some of her favorite foods as much. She reports that her energy and motivation have been recently affected. She reports that she takes her dog for a daily walk. She reports concentration overall is still ok with some decrease in focus. She notices that she has withdrawn some socially. Diminished interest in things and has not been wanting to do word searches. Denies SI.   She continues to feel cold frequently. Sweating has not been as bad as it has been, possibly due to colder weather.   Past Medication Trials: Lexapro- "My anti-depressant of choice" and gives her energy. Was taking 2-3 years prior to surgery. Re-started one week ago. Has taken 10 mg and 20 mg doses in the past. Thinks it may have helped with pain in the past.  Cymbalta- Started after back surgery. Has made her tired, even when reduced to 30 mg. Was having to take 2 hour naps. May have helped slightly with pain. Had HA, Constipation Prozac- Constipation. Causes her to feel cold.  Celexa Lexapro Zoloft- worsening mood Effexor-HA, helped with excessive sweating.  Paxil Wellbutrin- Did not cause HA's. Tolerated well and was effective for depression.  Amitriptyline- Did not cause HA's. Weight gain Nortriptyline- Did not cause HA's. Weight gain Lamictal-  Rash Gabapentin -Rhabomyolysis Lyrica- Adverse reaction Buspar Trazodone Ambien- Parasomnia Lunesta- Tolerated  Reports that many medications caused her to have HA.  Never taken Pristiq, Trintellix, Brintellix, Fetzima, Remeron, Viibryd, Seroquel, abilify Rexulti  Review of Systems:  Review of Systems  Gastrointestinal: Negative.    Musculoskeletal: Negative for gait problem.       Reports that screws in her hips need to be removed and this is causing significant pain  Neurological: Positive for headaches. Negative for tremors.       Has noticed some increase in headaches (not migraines)  Psychiatric/Behavioral:       Please refer to HPI   Has been trying to reduce her pain medication  Medications: I have reviewed the patient's current medications.  Current Outpatient Medications  Medication Sig Dispense Refill  . b complex vitamins capsule Take 1 capsule by mouth daily.    . baclofen (LIORESAL) 10 MG tablet Take 10 mg by mouth every 8 (eight) hours as needed.     . Carboxymethylcellulose Sodium (THERATEARS OP) Apply 1 drop to eye daily as needed (dry eyes).    . cetirizine (ZYRTEC) 10 MG tablet Take 10 mg by mouth daily.    . Cyanocobalamin 1000 MCG/ML KIT Inject as directed.    . diclofenac Sodium (VOLTAREN) 1 % GEL     . escitalopram (LEXAPRO) 10 MG tablet Take 1 tablet (10 mg total) by mouth daily. 90 tablet 0  . fluticasone (FLONASE) 50 MCG/ACT nasal spray Place 2 sprays into both nostrils 2 (two) times daily as needed for allergies or rhinitis.    Marland Kitchen HYDROcodone-acetaminophen (NORCO/VICODIN) 5-325 MG tablet     . montelukast (SINGULAIR) 10 MG tablet     . olopatadine (PATANOL) 0.1 % ophthalmic solution Place 1 drop into both eyes 2 (two) times daily as needed for allergies.     Marland Kitchen OVER THE COUNTER MEDICATION Take 2 capsules by mouth 2 (two) times daily as needed (allergies). Sinus Support EF    . rizatriptan (MAXALT) 10 MG tablet Take 10 mg by mouth daily as needed for migraine.     Marland Kitchen VITAMIN D PO Take 4 capsules by mouth daily.    . cloNIDine (CATAPRES) 0.1 MG tablet Take 1/2 tablet at bedtime. May increase to twice daily as tolerated 30 tablet 2  . Erenumab-aooe (AIMOVIG) 70 MG/ML SOAJ Inject 70 mg into the skin every 28 (twenty-eight) days. (Patient not taking: No sig reported) 1.12 mL 5  .  HYDROcodone-acetaminophen (NORCO/VICODIN) 5-325 MG tablet Take 1-2 tablets by mouth every 6 (six) hours as needed. 20 tablet 0  . lidocaine (LIDODERM) 5 % 1 patch daily. (Patient not taking: Reported on 04/30/2020)    . mometasone (ELOCON) 0.1 % cream Apply topically 2 (two) times daily. (Patient not taking: Reported on 04/30/2020)    . progesterone (PROMETRIUM) 200 MG capsule Take 200 mg by mouth at bedtime. (Patient not taking: Reported on 04/30/2020)     No current facility-administered medications for this visit.    Medication Side Effects: Other: N/A  Allergies:  Allergies  Allergen Reactions  . Gabapentin Other (See Comments)  . Polymyxin B Other (See Comments)    Eyes go blood red  . Cymbalta [Duloxetine Hcl] Other (See Comments)    Headaches, constipation  . Duloxetine Other (See Comments)    HEADACHES CONSTIPATION  . Lamotrigine Rash  . Other Other (See Comments)    OTOBIOTIC > RED EYES  . Pregabalin Rash    Itchy red rash on chest  .  Prozac [Fluoxetine] Other (See Comments)    CONSTIPATION  . Zolpidem Rash    "sleep walk"    Past Medical History:  Diagnosis Date  . Allergic rhinitis   . Anxiety   . Arthritis   . Cancer ([place])    skin  . Chronic pain   . Chronically dry eyes   . Depression   . Fibromyalgia   . Headache   . Vitamin D deficiency   . Wears glasses     Family History  Problem Relation Age of Onset  . Depression Mother   . Heart attack Mother   . COPD Mother   . Depression Father   . Heart attack Father   . ADD / ADHD Son     Social History   Socioeconomic History  . Marital status: Widowed    Spouse name: Not on file  . Number of children: Not on file  . Years of education: Not on file  . Highest education level: Not on file  Occupational History  . Not on file  Tobacco Use  . Smoking status: Never Smoker  . Smokeless tobacco: Never Used  Vaping Use  . Vaping Use: Never used  Substance and Sexual Activity  . Alcohol use:  No  . Drug use: No  . Sexual activity: Not on file  Other Topics Concern  . Not on file  Social History Narrative   Right handed   Social Determinants of Health   Financial Resource Strain: Not on file  Food Insecurity: Not on file  Transportation Needs: Not on file  Physical Activity: Not on file  Stress: Not on file  Social Connections: Not on file  Intimate Partner Violence: Not on file    Past Medical History, Surgical history, Social history, and Family history were reviewed and updated as appropriate.   Please see review of systems for further details on the patient's review from today.   Objective:   Physical Exam:  There were no vitals taken for this visit.  Physical Exam Neurological:     Mental Status: She is alert and oriented to person, place, and time.     Cranial Nerves: No dysarthria.  Psychiatric:        Attention and Perception: Attention and perception normal.        Mood and Affect: Mood is anxious and depressed.        Speech: Speech normal.        Behavior: Behavior is cooperative.        Thought Content: Thought content normal. Thought content is not paranoid or delusional. Thought content does not include homicidal or suicidal ideation. Thought content does not include homicidal or suicidal plan.        Cognition and Memory: Cognition and memory normal.        Judgment: Judgment normal.     Comments: Insight intact     Lab Review:     Component Value Date/Time   NA 138 06/29/2019 1156   K 3.5 06/29/2019 1156   CL 106 06/26/2019 1008   CO2 24 06/26/2019 1008   GLUCOSE 98 06/26/2019 1008   BUN 20 06/26/2019 1008   CREATININE 0.84 06/26/2019 1008   CALCIUM 9.6 06/26/2019 1008   PROT 6.9 06/26/2019 1008   ALBUMIN 4.0 06/26/2019 1008   AST 22 06/26/2019 1008   ALT 15 06/26/2019 1008   ALKPHOS 75 06/26/2019 1008   BILITOT 0.5 06/26/2019 1008   GFRNONAA >60 06/26/2019 1008   GFRAA >  60 06/26/2019 1008       Component Value Date/Time    WBC 6.3 06/26/2019 1008   RBC 4.45 06/26/2019 1008   HGB 9.5 (L) 06/29/2019 1156   HCT 28.0 (L) 06/29/2019 1156   PLT 269 06/26/2019 1008   MCV 95.5 06/26/2019 1008   MCH 30.1 06/26/2019 1008   MCHC 31.5 06/26/2019 1008   RDW 12.6 06/26/2019 1008   LYMPHSABS 1.9 06/26/2019 1008   MONOABS 0.4 06/26/2019 1008   EOSABS 0.1 06/26/2019 1008   BASOSABS 0.0 06/26/2019 1008    No results found for: POCLITH, LITHIUM   No results found for: PHENYTOIN, PHENOBARB, VALPROATE, CBMZ   .res Assessment: Plan:   Discussed potential benefits, risks, and side effects of Lexapro. Will re-start Lexapro 10 mg po qd for depression and anxiety since this has been most effective for her mood and anxiety s/s in the past.  Pt to follow-up in 6-8 weeks or sooner if clinically indicated.  Patient advised to contact office with any questions, adverse effects, or acute worsening in signs and symptoms.  Amylynn was seen today for depression and anxiety.  Diagnoses and all orders for this visit:  PTSD (post-traumatic stress disorder) -     escitalopram (LEXAPRO) 10 MG tablet; Take 1 tablet (10 mg total) by mouth daily.  Moderate recurrent major depression (HCC) -     escitalopram (LEXAPRO) 10 MG tablet; Take 1 tablet (10 mg total) by mouth daily.    Please see After Visit Summary for patient specific instructions.  Future Appointments  Date Time Provider [place]  07/25/2020 10:30 AM [facility] 1 [facility]  07/25/2020 10:50 AM [facility] 1 [facility]  07/29/2020  9:00 AM Bauert, Nicolasa Ducking, LCSW [facility] None  08/22/2020  1:45 PM Trula Slade, DPM [facility]  09/10/2020 10:30 AM Thayer Headings, PMHNP [facility] None  09/24/2020  2:30 PM Pieter Partridge, DO [facility] None    No orders of the defined types were placed in this encounter.     -------------------------------

## 2020-07-22 NOTE — Telephone Encounter (Signed)
Sharon Logan, Sharon Logan are scheduled for a virtual visit with your provider today.    Just as we do with appointments in the office, we must obtain your consent to participate.  Your consent will be active for this visit and any virtual visit you may have with one of our providers in the next 365 days.    If you have a MyChart account, I can also send a copy of this consent to you electronically.  All virtual visits are billed to your insurance company just like a traditional visit in the office.  As this is a virtual visit, video technology does not allow for your provider to perform a traditional examination.  This may limit your provider's ability to fully assess your condition.  If your provider identifies any concerns that need to be evaluated in person or the need to arrange testing such as labs, EKG, etc, we will make arrangements to do so.    Although advances in technology are sophisticated, we cannot ensure that it will always work on either your end or our end.  If the connection with a video visit is poor, we may have to switch to a telephone visit.  With either a video or telephone visit, we are not always able to ensure that we have a secure connection.   I need to obtain your verbal consent now.   Are you willing to proceed with your visit today?   Sharon Logan has provided verbal consent on 07/22/2020 for a virtual visit (video or telephone).   Thayer Headings, PMHNP 07/22/2020  8:48 AM

## 2020-07-23 ENCOUNTER — Telehealth: Payer: Self-pay | Admitting: Podiatry

## 2020-07-23 NOTE — Telephone Encounter (Signed)
Sharon Logan office called and wanted to know if she needs to be pre-medicated since Dr. Jacqualyn Posey did a toe replacement. If she does not need medication before her dental cleaning please fax over a note stating that its ok for her to get the cleaning done.

## 2020-07-24 ENCOUNTER — Encounter: Payer: Self-pay | Admitting: Podiatry

## 2020-07-24 NOTE — Telephone Encounter (Signed)
She does not need to be pre-medicated. Can you please write the note? Thanks!

## 2020-07-24 NOTE — Telephone Encounter (Signed)
done

## 2020-07-25 ENCOUNTER — Ambulatory Visit
Admission: RE | Admit: 2020-07-25 | Discharge: 2020-07-25 | Disposition: A | Discharge status: Home / Self Care | Payer: Federal, State, Local not specified - PPO | Source: Ambulatory Visit | Attending: Orthopedic Surgery | Admitting: Orthopedic Surgery

## 2020-07-25 ENCOUNTER — Other Ambulatory Visit: Payer: Self-pay

## 2020-07-25 DIAGNOSIS — M545 Low back pain, unspecified: Secondary | ICD-10-CM

## 2020-07-25 IMAGING — CT CT L SPINE W/O CM
3 of 4 series · 13 of 33 positions shown, 16 images · non-contrast
Comparison: Lumbar MRI [DATE]. C-arm images lumbar spine
[DATE]

CLINICAL DATA: Low back pain.  History of lumbar fusion.

EXAM:
CT LUMBAR SPINE WITHOUT CONTRAST
TECHNIQUE: Multidetector CT imaging of the lumbar spine was performed without
intravenous contrast administration. Multiplanar CT image
reconstructions were also generated.

[Series 3: l-spine 2.00 br40 s3 lspine st · axial · 0.38mm/px · z∈[+1350,+1494]mm · 5 of 109 slices shown, 7 images]
[im 19/109  soft-tissue]
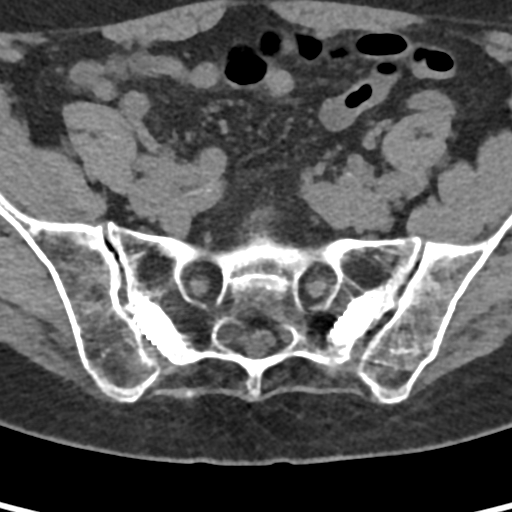
[im 19/109  bone]
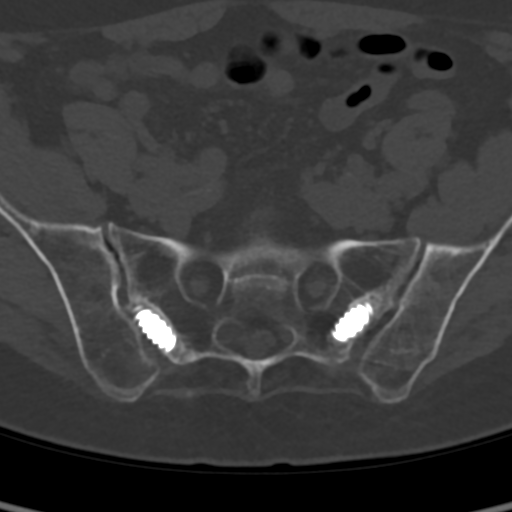
[im 37/109  bone]
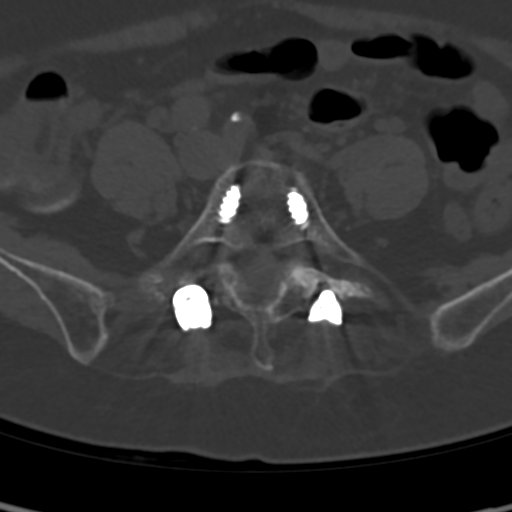
[im 55/109  bone]
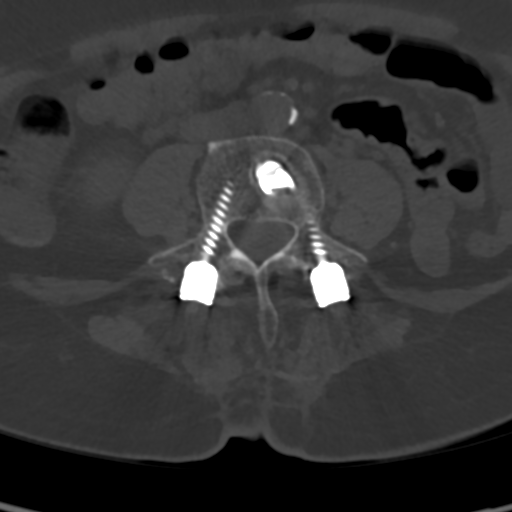
[im 73/109  bone]
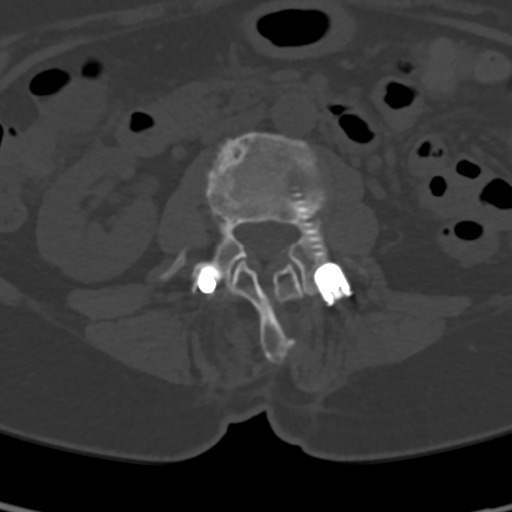
[im 91/109  soft-tissue]
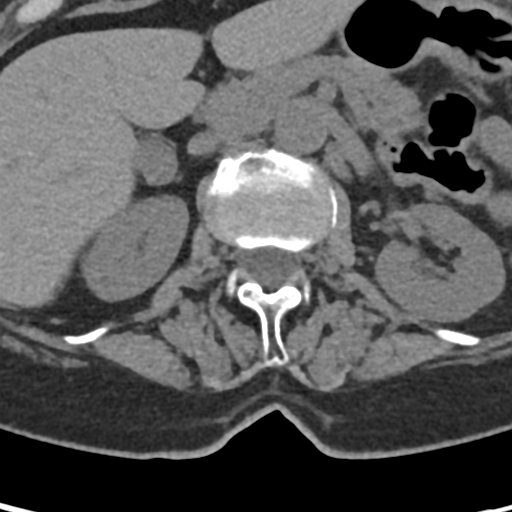
[im 91/109  bone]
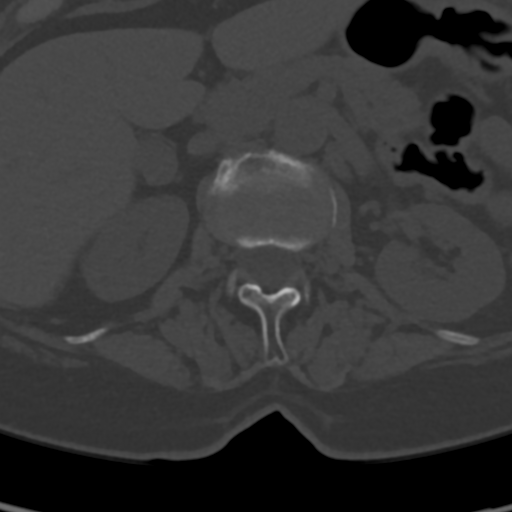

[Series 5: l-spine 2.00 br60 s3 sag bone · sagittal · 0.28mm/px · 5 of 97 slices shown, 6 images]
[im 33/97  bone]
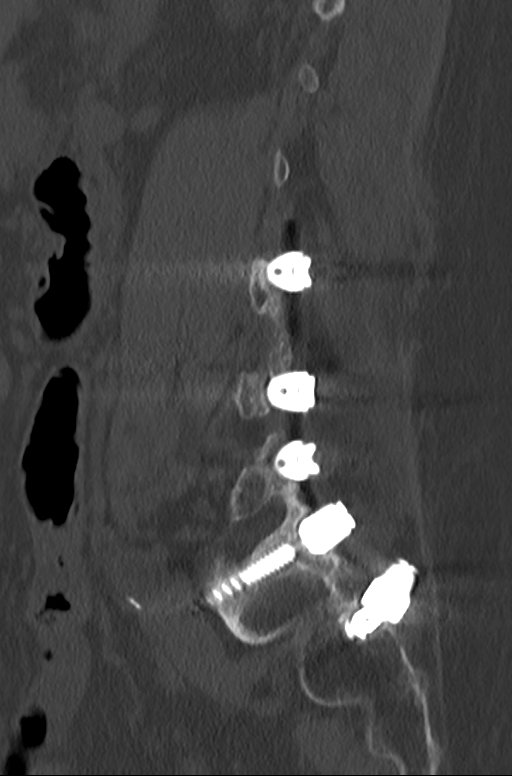
[im 41/97  bone]
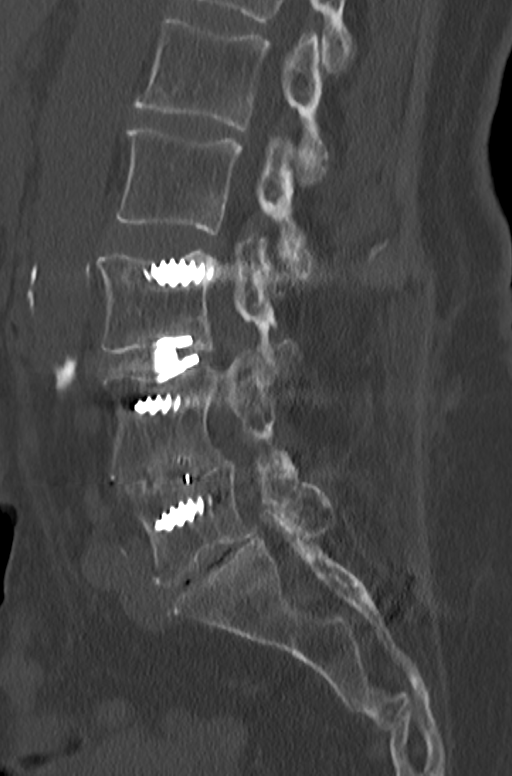
[im 49/97  soft-tissue]
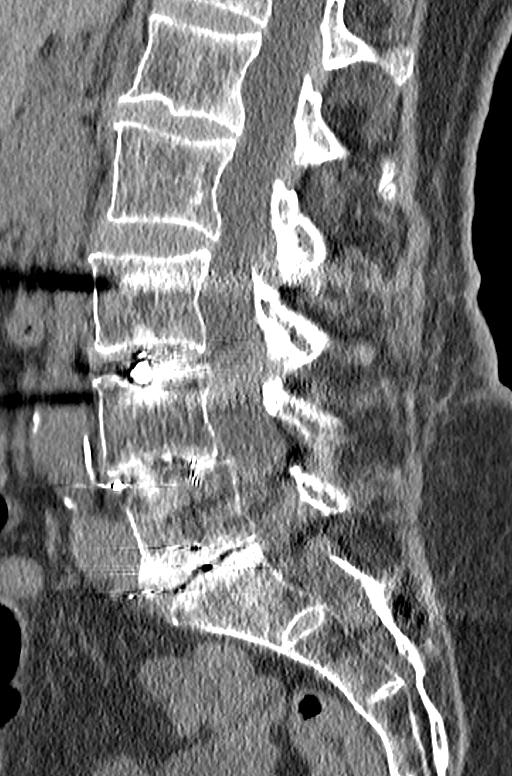
[im 49/97  bone]
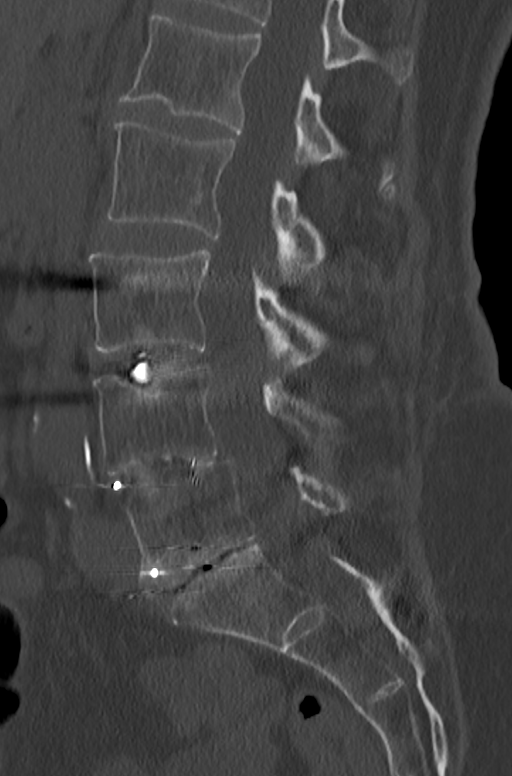
[im 57/97  bone]
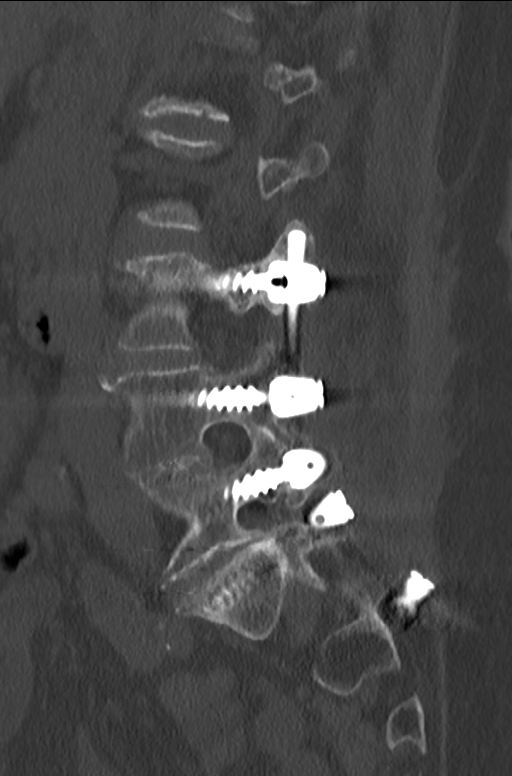
[im 65/97  bone]
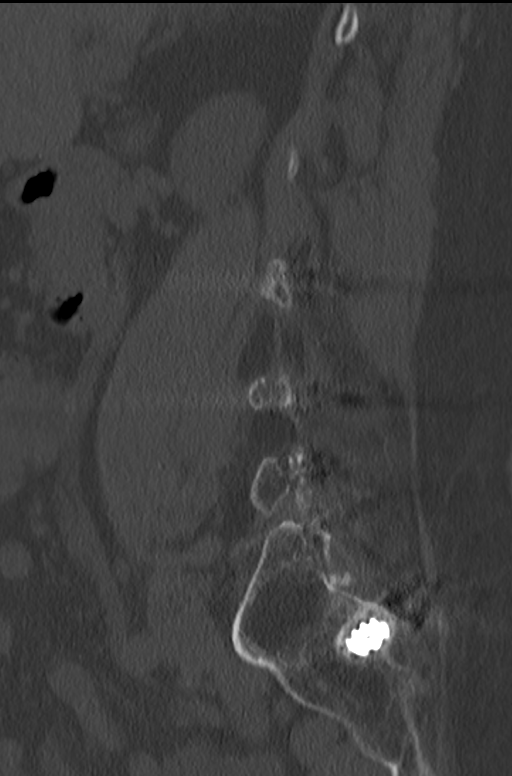

[Series 7: l-spine 2.00 br60 s3 cor bone · coronal · 0.38mm/px · 3 of 72 slices shown]
[im 15/72  bone]
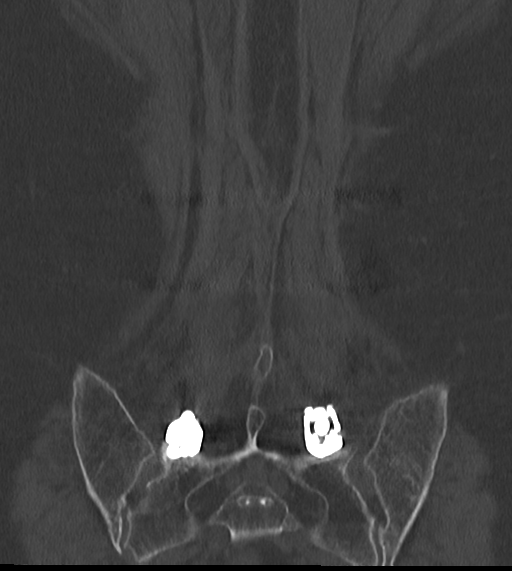
[im 29/72  bone]
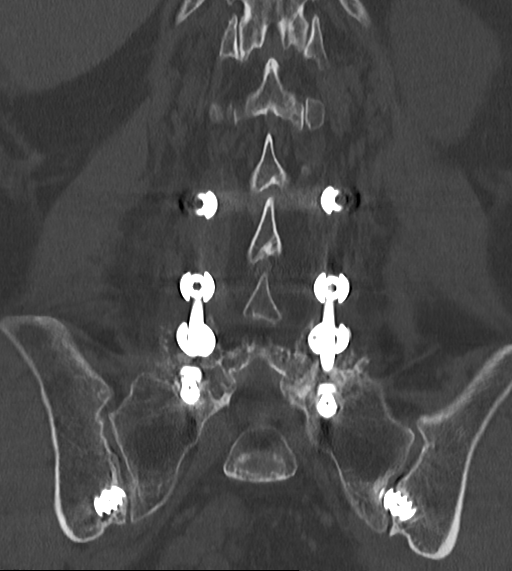
[im 43/72  bone]
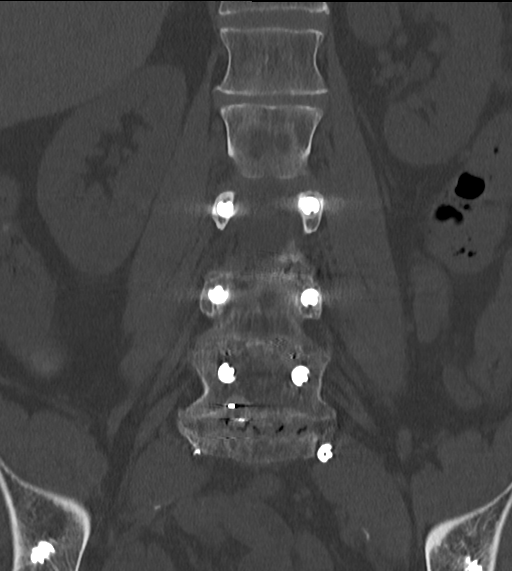

[13 of 33 positions shown; findings below may reference images not displayed]

FINDINGS: Segmentation: Normal

Alignment: Mild retrolisthesis L2-3.  Slight anterolisthesis L4-5.

Vertebrae: Negative for fracture or mass.

Paraspinal and other soft tissues: Negative for paraspinous mass or
adenopathy or fluid collection. Mild atherosclerotic calcification
in the aorta.

Disc levels: L1-2: Mild disc degeneration.  Negative for stenosis

L2-3: Mild retrolisthesis. Disc bulging and mild facet degeneration.
Negative for stenosis.

L3-4: Pedicle screw and interbody fusion. Metal interbody spacer
with subsidence into the endplates at L3-4. No solid bony fusion
present. Pedicle screws in satisfactory position

L4-5: Pedicle screw and interbody fusion. Solid interbody fusion.
Hardware in satisfactory position

L5-S1: Pedicle screw and interbody fusion. Persistent gas in the
disc space. Probable pseudarthrosis at this level.

Bilateral screws extend into the iliac bone across the SI joint
bilaterally. No hardware lucency or fracture.
IMPRESSION: Pedicle screw and interbody fusion L3-4. Subsidence of the spacer
without bony fusion

Solid fusion L4-5

Pedicle screw and interbody fusion L5-S1 with probable
pseudarthrosis.

## 2020-07-25 IMAGING — CT CT PELVIS W/O CM
2 of 6 series · 13 of 46 positions shown, 18 images · non-contrast
Comparison: None.

CLINICAL DATA: Chronic back pain.  History of lumbosacral fusion.

EXAM:
CT PELVIS WITHOUT CONTRAST
TECHNIQUE: Multidetector CT imaging of the pelvis was performed following the
standard protocol without intravenous contrast.

[Series 8: pelvis 2.00 br40 s3 axial st · axial · 0.76mm/px · z∈[+1207,+1415]mm · 10 of 121 slices shown, 15 images (1 of 2)]
[im 9/121  soft-tissue]
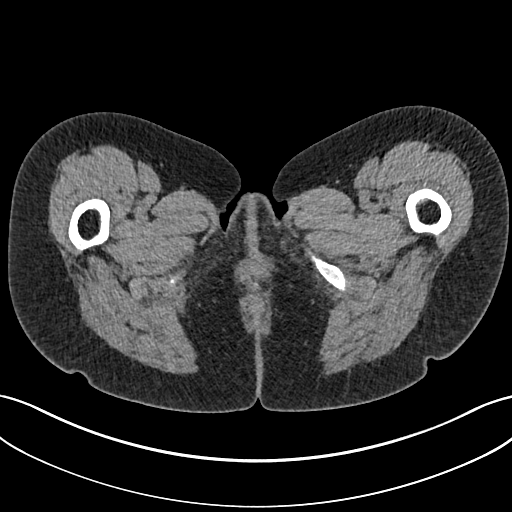
[im 9/121  bone]
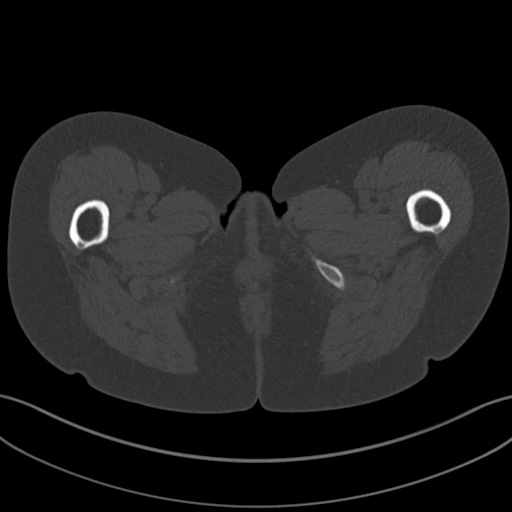
[im 25/121  soft-tissue]
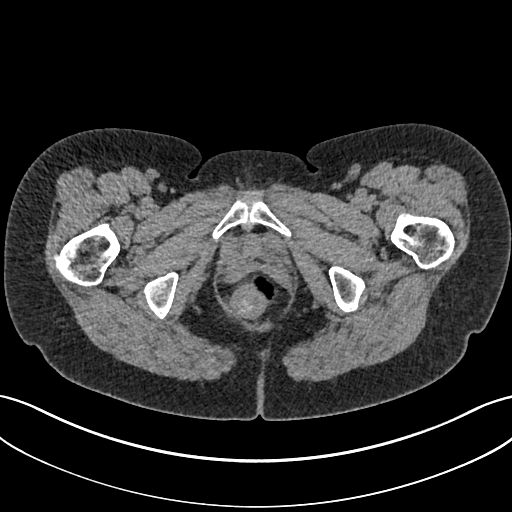
[im 33/121  soft-tissue]
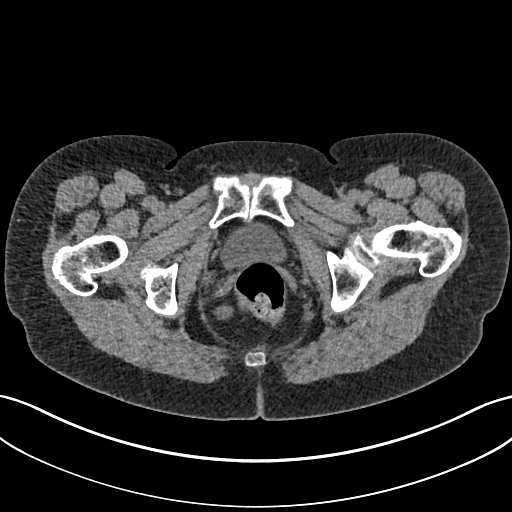
[im 49/121  soft-tissue]
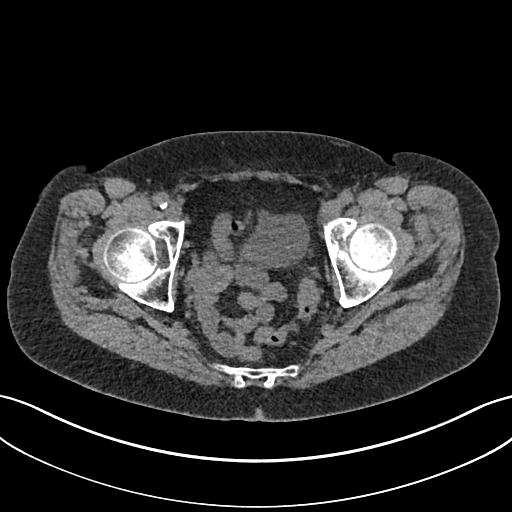
[im 65/121  soft-tissue]
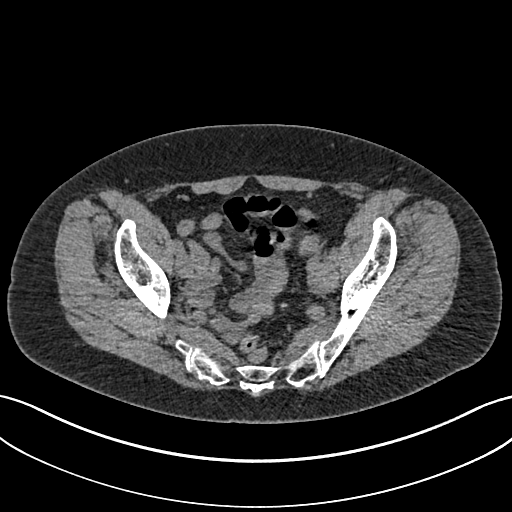
[im 73/121  soft-tissue]
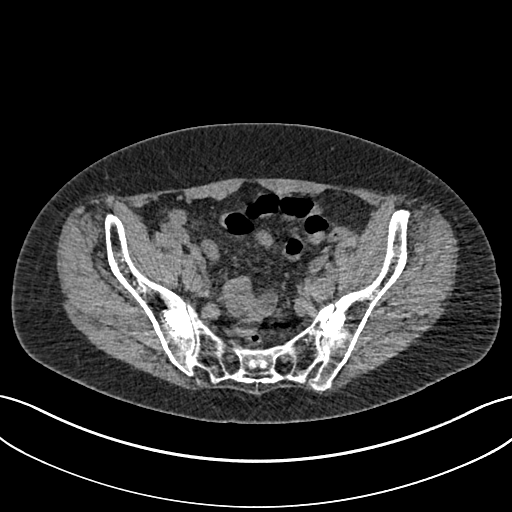
[im 89/121  soft-tissue]
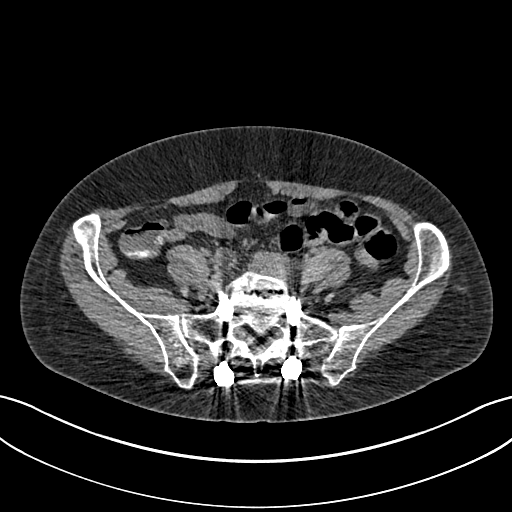
[im 89/121  lung]
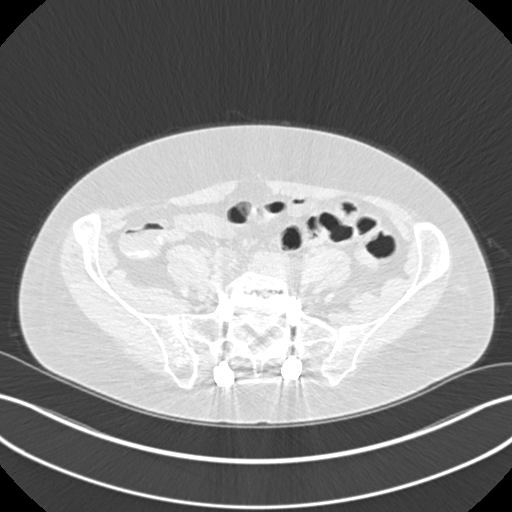
[im 97/121  soft-tissue]
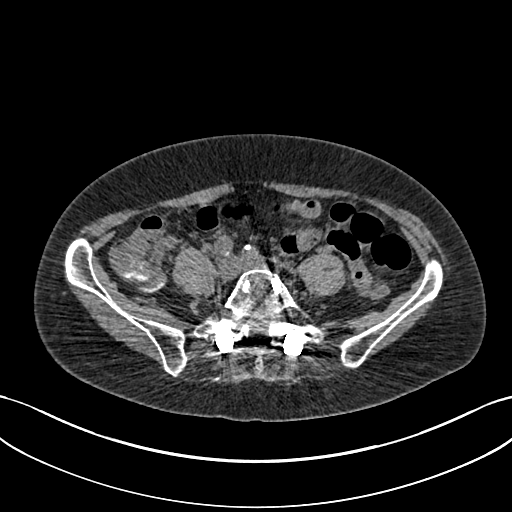
[im 97/121  lung]
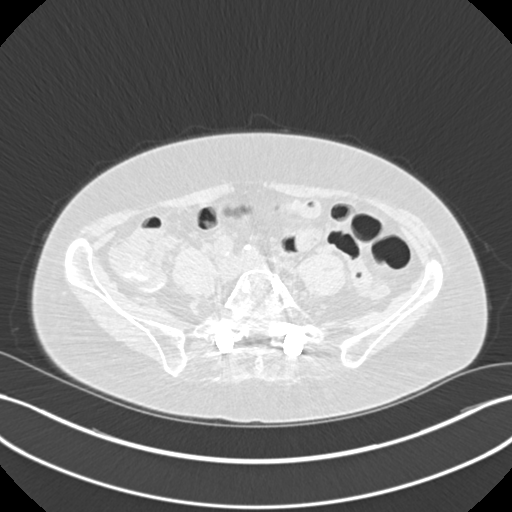
[im 105/121  lung]
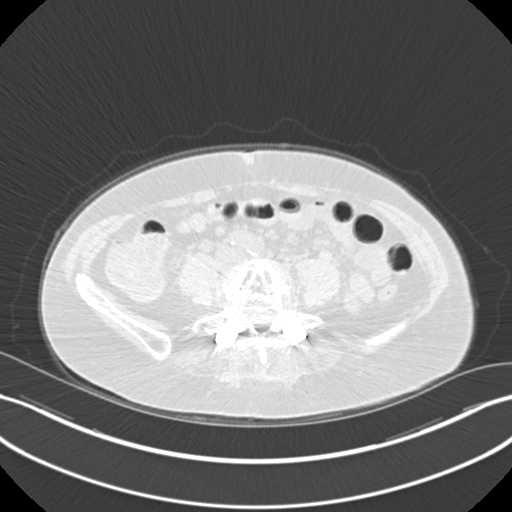
[im 113/121  soft-tissue]
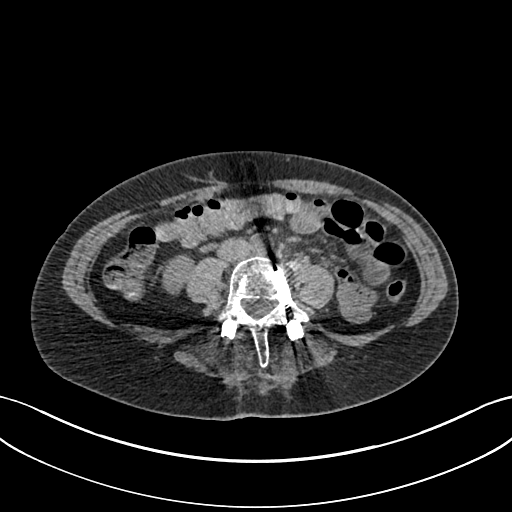
[im 113/121  lung]
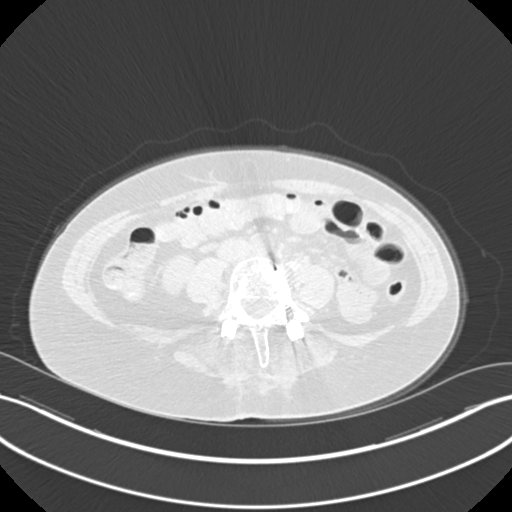
[im 113/121  bone]
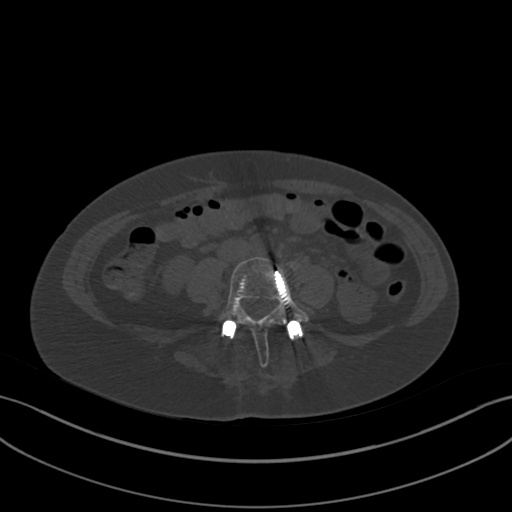

[Series 10: pelvis 2.00 br40 s3 axial st · coronal · 0.47mm/px · 3 of 182 slices shown (2 of 2)]
[im 46/182  soft-tissue]
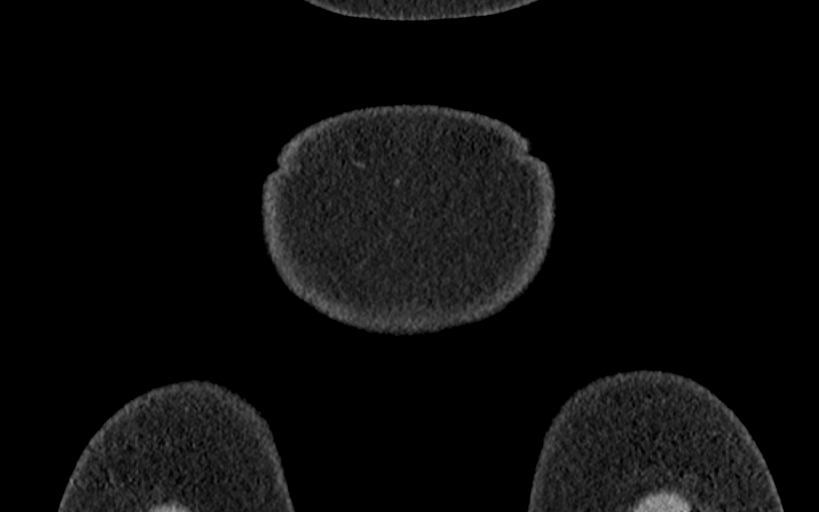
[im 91/182  soft-tissue]
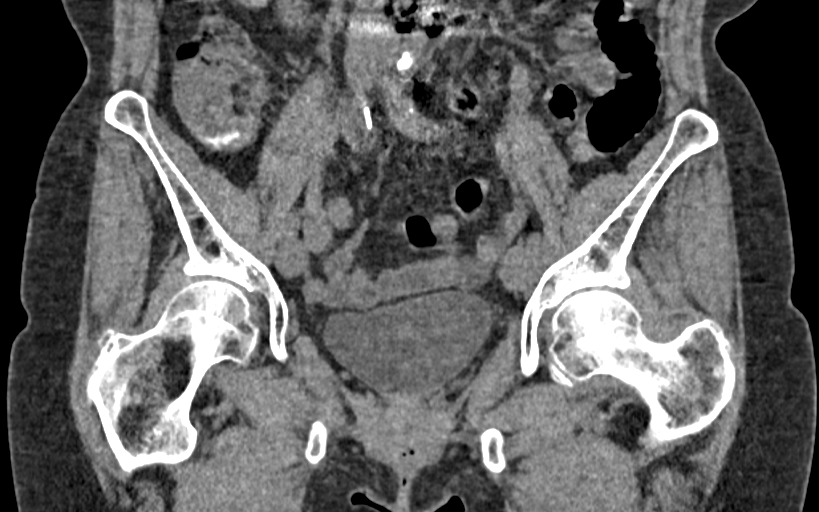
[im 136/182  soft-tissue]
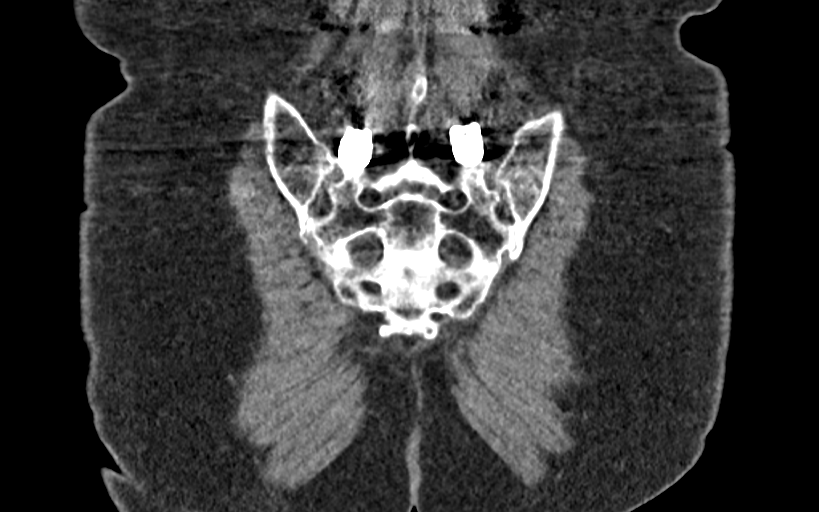

[13 of 46 positions shown; findings below may reference images not displayed]

FINDINGS: Both hips are normally located. No hip fracture or AVN. No
significant degenerative changes.

The pubic symphysis and SI joints are intact. The lumbosacral fusion
hardware includes large sacral screws which traverse the SI joints
and extend into the iliac bones bilaterally. No complicating
features are identified. I do not see any evidence of loose
hardware. No pelvic fractures or bone lesions.

No significant intrapelvic abnormalities are identified.

The hip and pelvic musculature appear grossly normal. No obvious
muscle tear or intramuscular hematoma mass.
IMPRESSION: 1. Intact pelvic lumbosacral fusion hardware without complicating
features.
2. No acute bony findings, bone lesions or evidence of loose
hardware.
3. No significant intrapelvic abnormalities.

## 2020-07-29 ENCOUNTER — Ambulatory Visit (INDEPENDENT_AMBULATORY_CARE_PROVIDER_SITE_OTHER): Payer: PPO | Admitting: Psychology

## 2020-07-29 DIAGNOSIS — F4321 Adjustment disorder with depressed mood: Secondary | ICD-10-CM | POA: Diagnosis not present

## 2020-08-02 ENCOUNTER — Other Ambulatory Visit: Payer: Federal, State, Local not specified - PPO

## 2020-08-06 ENCOUNTER — Telehealth: Payer: Self-pay | Admitting: Psychiatry

## 2020-08-06 DIAGNOSIS — F4310 Post-traumatic stress disorder, unspecified: Secondary | ICD-10-CM

## 2020-08-06 DIAGNOSIS — M797 Fibromyalgia: Secondary | ICD-10-CM

## 2020-08-06 DIAGNOSIS — F331 Major depressive disorder, recurrent, moderate: Secondary | ICD-10-CM

## 2020-08-06 MED ORDER — DULOXETINE HCL 30 MG PO CPEP: 30.0000 mg | ORAL_CAPSULE | Freq: Every day | ORAL | 0 refills | Status: DC

## 2020-08-06 NOTE — Telephone Encounter (Signed)
Pt called and said that the lexapro that she is taking is making her feel very depressed. She doesn't want to get out of bed. Please call her at [phone]

## 2020-08-06 NOTE — Telephone Encounter (Signed)
Returned call to pt. She reports that she has been feeling more depressed since re-starting Lexapro which is surprising to her. She reports that she has been feeling tired and not wanting to get out of bed. She reports that she has had periods of sadness. She reports that anxiety has also been intermittent. She reports that Lexapro has not had any impact on her chronic pain. Sleeping 5-6 hours of sleep without awakening.    Denies SI.   Would like to switch to either Cymbalta or Nortriptyline. She reports that Cymbalta was somewhat helpful for her depression. She reports that Nortriptyline seemed to "pull me out of my depression in a hurry." Feels that Cymbalta was helpful for her pain.   Denies any kidney or cardiac issues.   Pt will call back in about 4 weeks to discuss response and tolerability to Cymbalta. Discussed taking Cymbalta 30 mg later in the day if she experiences somnolence.   Patient advised to contact office with any questions, adverse effects, or acute worsening in signs and symptoms.

## 2020-08-07 ENCOUNTER — Other Ambulatory Visit: Payer: Self-pay | Admitting: Orthopedic Surgery

## 2020-08-09 ENCOUNTER — Encounter: Payer: Self-pay | Admitting: Physical Medicine and Rehabilitation

## 2020-08-13 ENCOUNTER — Ambulatory Visit: Payer: Federal, State, Local not specified - PPO | Admitting: Psychiatry

## 2020-08-13 ENCOUNTER — Telehealth: Payer: Self-pay | Admitting: Psychiatry

## 2020-08-13 DIAGNOSIS — F331 Major depressive disorder, recurrent, moderate: Secondary | ICD-10-CM

## 2020-08-13 DIAGNOSIS — F4310 Post-traumatic stress disorder, unspecified: Secondary | ICD-10-CM

## 2020-08-13 DIAGNOSIS — M797 Fibromyalgia: Secondary | ICD-10-CM

## 2020-08-13 MED ORDER — DULOXETINE HCL 60 MG PO CPEP: 60.0000 mg | ORAL_CAPSULE | Freq: Every day | ORAL | 1 refills | Status: DC

## 2020-08-13 NOTE — Telephone Encounter (Signed)
Avenell called to report that she fills her Cymbalta needs to be increased. She is on 30mg  now.  Please call to discuss the increase.

## 2020-08-13 NOTE — Telephone Encounter (Signed)
LVM

## 2020-08-14 DIAGNOSIS — M25311 Other instability, right shoulder: Secondary | ICD-10-CM | POA: Diagnosis not present

## 2020-08-14 DIAGNOSIS — M545 Low back pain, unspecified: Secondary | ICD-10-CM | POA: Diagnosis not present

## 2020-08-14 DIAGNOSIS — Z9889 Other specified postprocedural states: Secondary | ICD-10-CM | POA: Diagnosis not present

## 2020-08-14 DIAGNOSIS — S43431D Superior glenoid labrum lesion of right shoulder, subsequent encounter: Secondary | ICD-10-CM | POA: Diagnosis not present

## 2020-08-14 DIAGNOSIS — M25511 Pain in right shoulder: Secondary | ICD-10-CM | POA: Diagnosis not present

## 2020-08-16 ENCOUNTER — Telehealth: Payer: Self-pay | Admitting: Psychiatry

## 2020-08-16 DIAGNOSIS — M25311 Other instability, right shoulder: Secondary | ICD-10-CM | POA: Diagnosis not present

## 2020-08-16 DIAGNOSIS — M25511 Pain in right shoulder: Secondary | ICD-10-CM | POA: Diagnosis not present

## 2020-08-16 DIAGNOSIS — S43431D Superior glenoid labrum lesion of right shoulder, subsequent encounter: Secondary | ICD-10-CM | POA: Diagnosis not present

## 2020-08-16 DIAGNOSIS — Z9889 Other specified postprocedural states: Secondary | ICD-10-CM | POA: Diagnosis not present

## 2020-08-18 NOTE — Telephone Encounter (Signed)
Received written note from pt that she is not tolerating increase in Cymbalta to 60 mg daily and is now having difficulty with insomnia and "electric shock" sensations throughout the day. She reports that her depression remains severe. Pt requested assistance.  Attempted to reach pt to discuss other possible options and left message for patient. Provider noticed apt opening for 08/19/20 and requested that office staff reserve apt time for pt and call and off her apt. Staff report that they were able to reach pt and she agreed to apt on 08/19/20.

## 2020-08-19 ENCOUNTER — Encounter: Payer: Self-pay | Admitting: Psychiatry

## 2020-08-19 ENCOUNTER — Ambulatory Visit (INDEPENDENT_AMBULATORY_CARE_PROVIDER_SITE_OTHER): Payer: PPO | Admitting: Psychiatry

## 2020-08-19 ENCOUNTER — Other Ambulatory Visit: Payer: Self-pay

## 2020-08-19 DIAGNOSIS — S43431D Superior glenoid labrum lesion of right shoulder, subsequent encounter: Secondary | ICD-10-CM | POA: Diagnosis not present

## 2020-08-19 DIAGNOSIS — F4310 Post-traumatic stress disorder, unspecified: Secondary | ICD-10-CM | POA: Diagnosis not present

## 2020-08-19 DIAGNOSIS — F331 Major depressive disorder, recurrent, moderate: Secondary | ICD-10-CM

## 2020-08-19 DIAGNOSIS — Z9889 Other specified postprocedural states: Secondary | ICD-10-CM | POA: Diagnosis not present

## 2020-08-19 DIAGNOSIS — M25511 Pain in right shoulder: Secondary | ICD-10-CM | POA: Diagnosis not present

## 2020-08-19 DIAGNOSIS — M25311 Other instability, right shoulder: Secondary | ICD-10-CM | POA: Diagnosis not present

## 2020-08-19 MED ORDER — NORTRIPTYLINE HCL 10 MG PO CAPS
ORAL_CAPSULE | ORAL | 1 refills | Status: DC
Start: 2020-08-19 — End: 2020-08-26

## 2020-08-19 NOTE — Progress Notes (Signed)
Sharon Logan [id number] 1955/07/20 65 y.o.  Subjective:   Patient ID:  Sharon Logan is a 65 y.o. (DOB Mar 04, 1956) female.  Chief Complaint:  Chief Complaint  Patient presents with  . Depression  . Anxiety  . Insomnia    HPI Sharon Logan presents to the office today for follow-up of depression and anxiety. She reports that she is "not tolerating Cymbalta at all." She reports that she is experiencing electric shock sensations "up and down my body." She reports that she then went to 30 mg and then 30 mg every other day for a few days and then stopped it around 08/15/20. She reports "the constant burn did not leave until I quit cold [place]." She reports that she had worsening insomnia and nausea with Cymbalta. She reports that the other side effects started to improve with dose reduction. She reports, "the SSRI's that I have tried have been really rough on my body." She reports that tricyclic antidepressants have been the most effective for her in the past and that she did not have affective dulling.   She reports that her mood "seems good. I don't feel depressed." She reports that she has some anxiety. Not having constant stomach discomfort with anxiety. Denies panic s/s. Typically takes 2 hours to call asleep and sleeps about 5 hours. Appetite has been improving. Has been taking a course through the [place] about healthy eating. She has weaned off sodas. She has been trying to increase intake of fruits and veggies. She reports that her energy and motivation have been "zero." Trying to push herself to do things, such as putting exercise on her calendar. She has been walking daily. She reports that she has difficulty with concentration. She reports that she has to use her calendar and notes to stay organized. Denies SI.   She asks about re-starting Nortriptyline since this has been effective for her depression in the past, and possibly pain and anxiety as well.   She reports that she has been connecting  with group of single women at her church.    Past Medication Trials: Lexapro- "My anti-depressant of choice" and gives her energy. Was taking 2-3 years prior to surgery. Re-started one week ago. Has taken 10 mg and 20 mg doses in the past. Thinks it may have helped with pain in the past.  Cymbalta- Started after back surgery. Has made her tired, even when reduced to 30 mg. Was having to take 2 hour naps. May have helped slightly with pain. Had HA, Constipation, Nausea Prozac- Constipation. Causes her to feel cold. Celexa Lexapro Zoloft- worsening mood Effexor-HA, helped with excessive sweating. Paxil Wellbutrin- Did not cause HA's. Tolerated well and was effective for depression.  Amitriptyline- Did not cause HA's. Weight gain. Effective.  Nortriptyline- Did not cause HA's. Effective. Lamictal- Rash Gabapentin -Rhabomyolysis Lyrica- Adverse reaction Buspar Trazodone Ambien- Parasomnia Lunesta- Tolerated Clonidine  Review of Systems:  Review of Systems  Gastrointestinal: Negative.   Musculoskeletal: Negative for gait problem.       Had surgery scheduled for the end of the month to adjust screws in her hip.  Neurological: Negative for tremors and headaches.  Psychiatric/Behavioral:       Please refer to HPI   She reports that she is wanting to "wean off" pain medication since her pain is usually worse in cold weather and weather is starting to warm up.   Medications: I have reviewed the patient's current medications.  Current Outpatient Medications  Medication Sig Dispense  Refill  . b complex vitamins capsule Take 1 capsule by mouth daily.    . baclofen (LIORESAL) 10 MG tablet Take 10 mg by mouth every 8 (eight) hours as needed.     . Carboxymethylcellulose Sodium (THERATEARS OP) Apply 1 drop to eye daily as needed (dry eyes).    . Cyanocobalamin 1000 MCG/ML KIT Inject as directed.    . diclofenac Sodium (VOLTAREN) 1 % GEL     . fluticasone (FLONASE) 50 MCG/ACT nasal spray  Place 2 sprays into both nostrils 2 (two) times daily as needed for allergies or rhinitis.    Marland Kitchen HYDROcodone-acetaminophen (NORCO/VICODIN) 5-325 MG tablet     . levocetirizine (XYZAL) 5 MG tablet Take 5 mg by mouth every evening.    . nortriptyline (PAMELOR) 10 MG capsule Take 1 capsule at bedtime for one week, then may increase to 2 capsules at bedtime 60 capsule 1  . olopatadine (PATANOL) 0.1 % ophthalmic solution Place 1 drop into both eyes 2 (two) times daily as needed for allergies.     Marland Kitchen OVER THE COUNTER MEDICATION Take 2 capsules by mouth 2 (two) times daily as needed (allergies). Sinus Support EF    . rizatriptan (MAXALT) 10 MG tablet Take 10 mg by mouth daily as needed for migraine.     Marland Kitchen VITAMIN D PO Take 4 capsules by mouth daily.    Eduard Roux (AIMOVIG) 70 MG/ML SOAJ Inject 70 mg into the skin every 28 (twenty-eight) days. (Patient not taking: No sig reported) 1.12 mL 5  . HYDROcodone-acetaminophen (NORCO/VICODIN) 5-325 MG tablet Take 1-2 tablets by mouth every 6 (six) hours as needed. 20 tablet 0  . montelukast (SINGULAIR) 10 MG tablet  (Patient not taking: Reported on 08/19/2020)    . progesterone (PROMETRIUM) 200 MG capsule Take 200 mg by mouth at bedtime. (Patient not taking: Reported on 04/30/2020)     No current facility-administered medications for this visit.    Medication Side Effects: Other: N/A  Allergies:  Allergies  Allergen Reactions  . Gabapentin Other (See Comments)  . Polymyxin B Other (See Comments)    Eyes go blood red  . Cymbalta [Duloxetine Hcl] Other (See Comments)    Headaches, constipation  . Duloxetine Other (See Comments)    HEADACHES CONSTIPATION  . Lamotrigine Rash  . Other Other (See Comments)    OTOBIOTIC > RED EYES  . Pregabalin Rash    Itchy red rash on chest  . Prozac [Fluoxetine] Other (See Comments)    CONSTIPATION  . Zolpidem Rash    "sleep walk"    Past Medical History:  Diagnosis Date  . Allergic rhinitis   . Anxiety   .  Arthritis   . Cancer ([place])    skin  . Chronic pain   . Chronically dry eyes   . Depression   . Fibromyalgia   . Headache   . Vitamin D deficiency   . Wears glasses     Family History  Problem Relation Age of Onset  . Depression Mother   . Heart attack Mother   . COPD Mother   . Depression Father   . Heart attack Father   . ADD / ADHD Son     Social History   Socioeconomic History  . Marital status: Widowed    Spouse name: Not on file  . Number of children: Not on file  . Years of education: Not on file  . Highest education level: Not on file  Occupational History  . Not  on file  Tobacco Use  . Smoking status: Never Smoker  . Smokeless tobacco: Never Used  Vaping Use  . Vaping Use: Never used  Substance and Sexual Activity  . Alcohol use: No  . Drug use: No  . Sexual activity: Not on file  Other Topics Concern  . Not on file  Social History Narrative   Right handed   Social Determinants of Health   Financial Resource Strain: Not on file  Food Insecurity: Not on file  Transportation Needs: Not on file  Physical Activity: Not on file  Stress: Not on file  Social Connections: Not on file  Intimate Partner Violence: Not on file    Past Medical History, Surgical history, Social history, and Family history were reviewed and updated as appropriate.   Please see review of systems for further details on the patient's review from today.   Objective:   Physical Exam:  There were no vitals taken for this visit.  Physical Exam Constitutional:      General: She is not in acute distress. Musculoskeletal:        General: No deformity.  Neurological:     Mental Status: She is alert and oriented to person, place, and time.     Coordination: Coordination normal.  Psychiatric:        Attention and Perception: Attention and perception normal. She does not perceive auditory or visual hallucinations.        Mood and Affect: Mood is depressed. Mood is not anxious.  Affect is not labile, blunt, angry or inappropriate.        Speech: Speech normal.        Behavior: Behavior normal.        Thought Content: Thought content normal. Thought content is not paranoid or delusional. Thought content does not include homicidal or suicidal ideation. Thought content does not include homicidal or suicidal plan.        Cognition and Memory: Cognition and memory normal.        Judgment: Judgment normal.     Comments: Insight intact     Lab Review:     Component Value Date/Time   NA 138 06/29/2019 1156   K 3.5 06/29/2019 1156   CL 106 06/26/2019 1008   CO2 24 06/26/2019 1008   GLUCOSE 98 06/26/2019 1008   BUN 20 06/26/2019 1008   CREATININE 0.84 06/26/2019 1008   CALCIUM 9.6 06/26/2019 1008   PROT 6.9 06/26/2019 1008   ALBUMIN 4.0 06/26/2019 1008   AST 22 06/26/2019 1008   ALT 15 06/26/2019 1008   ALKPHOS 75 06/26/2019 1008   BILITOT 0.5 06/26/2019 1008   GFRNONAA >60 06/26/2019 1008   GFRAA >60 06/26/2019 1008       Component Value Date/Time   WBC 6.3 06/26/2019 1008   RBC 4.45 06/26/2019 1008   HGB 9.5 (L) 06/29/2019 1156   HCT 28.0 (L) 06/29/2019 1156   PLT 269 06/26/2019 1008   MCV 95.5 06/26/2019 1008   MCH 30.1 06/26/2019 1008   MCHC 31.5 06/26/2019 1008   RDW 12.6 06/26/2019 1008   LYMPHSABS 1.9 06/26/2019 1008   MONOABS 0.4 06/26/2019 1008   EOSABS 0.1 06/26/2019 1008   BASOSABS 0.0 06/26/2019 1008    No results found for: POCLITH, LITHIUM   No results found for: PHENYTOIN, PHENOBARB, VALPROATE, CBMZ   .res Assessment: Plan:   Patient seen for 30 minutes and time spent counseling patient regarding possible treatment options.  She reports that nortriptyline has  been most effective for her mood and anxiety signs and symptoms and has been well-tolerated. Will start nortriptyline 10 mg at bedtime for 1 week, then increase to 20 mg at bedtime for mood and anxiety signs and symptoms. Patient to follow-up in 3 to 4 weeks or sooner if  clinically indicated. Recommend continuing psychotherapy. Patient advised to contact office with any questions, adverse effects, or acute worsening in signs and symptoms.  Sharon Logan was seen today for depression, anxiety and insomnia.  Diagnoses and all orders for this visit:  PTSD (post-traumatic stress disorder) -     nortriptyline (PAMELOR) 10 MG capsule; Take 1 capsule at bedtime for one week, then may increase to 2 capsules at bedtime  Moderate recurrent major depression (HCC) -     nortriptyline (PAMELOR) 10 MG capsule; Take 1 capsule at bedtime for one week, then may increase to 2 capsules at bedtime     Please see After Visit Summary for patient specific instructions.  Future Appointments  Date Time Provider [place]  08/22/2020  3:15 PM Trula Slade, [place] [facility]  09/10/2020 10:30 AM Thayer Headings, PMHNP [facility] None  09/11/2020  3:00 PM Bauert, Nicolasa Ducking, LCSW [facility] None  09/24/2020  2:30 PM Pieter Partridge, DO [facility] None  10/08/2020 10:00 AM Raulkar, Clide Deutscher, MD [facility]    No orders of the defined types were placed in this encounter.   -------------------------------

## 2020-08-20 DIAGNOSIS — D485 Neoplasm of uncertain behavior of skin: Secondary | ICD-10-CM | POA: Diagnosis not present

## 2020-08-20 DIAGNOSIS — H61002 Unspecified perichondritis of left external ear: Secondary | ICD-10-CM | POA: Diagnosis not present

## 2020-08-21 DIAGNOSIS — S43431D Superior glenoid labrum lesion of right shoulder, subsequent encounter: Secondary | ICD-10-CM | POA: Diagnosis not present

## 2020-08-21 DIAGNOSIS — M25311 Other instability, right shoulder: Secondary | ICD-10-CM | POA: Diagnosis not present

## 2020-08-21 DIAGNOSIS — M25511 Pain in right shoulder: Secondary | ICD-10-CM | POA: Diagnosis not present

## 2020-08-21 DIAGNOSIS — Z9889 Other specified postprocedural states: Secondary | ICD-10-CM | POA: Diagnosis not present

## 2020-08-22 ENCOUNTER — Ambulatory Visit (INDEPENDENT_AMBULATORY_CARE_PROVIDER_SITE_OTHER): Payer: PPO | Admitting: Podiatry

## 2020-08-22 ENCOUNTER — Other Ambulatory Visit: Payer: Self-pay

## 2020-08-22 DIAGNOSIS — M779 Enthesopathy, unspecified: Secondary | ICD-10-CM

## 2020-08-22 DIAGNOSIS — M21961 Unspecified acquired deformity of right lower leg: Secondary | ICD-10-CM

## 2020-08-22 MED ORDER — TRIAMCINOLONE ACETONIDE 10 MG/ML IJ SUSP
10.0000 mg | Freq: Once | INTRAMUSCULAR | Status: AC
  Administered 2020-08-22: 10 mg

## 2020-08-23 DIAGNOSIS — S43431D Superior glenoid labrum lesion of right shoulder, subsequent encounter: Secondary | ICD-10-CM | POA: Diagnosis not present

## 2020-08-23 DIAGNOSIS — M25311 Other instability, right shoulder: Secondary | ICD-10-CM | POA: Diagnosis not present

## 2020-08-23 DIAGNOSIS — M25511 Pain in right shoulder: Secondary | ICD-10-CM | POA: Diagnosis not present

## 2020-08-23 DIAGNOSIS — Z9889 Other specified postprocedural states: Secondary | ICD-10-CM | POA: Diagnosis not present

## 2020-08-24 DIAGNOSIS — G44209 Tension-type headache, unspecified, not intractable: Secondary | ICD-10-CM | POA: Diagnosis not present

## 2020-08-24 DIAGNOSIS — H6012 Cellulitis of left external ear: Secondary | ICD-10-CM | POA: Diagnosis not present

## 2020-08-26 ENCOUNTER — Telehealth: Payer: Self-pay | Admitting: Psychiatry

## 2020-08-26 DIAGNOSIS — M797 Fibromyalgia: Secondary | ICD-10-CM | POA: Diagnosis not present

## 2020-08-26 DIAGNOSIS — M25511 Pain in right shoulder: Secondary | ICD-10-CM | POA: Diagnosis not present

## 2020-08-26 DIAGNOSIS — S73191A Other sprain of right hip, initial encounter: Secondary | ICD-10-CM | POA: Diagnosis not present

## 2020-08-26 DIAGNOSIS — F331 Major depressive disorder, recurrent, moderate: Secondary | ICD-10-CM

## 2020-08-26 DIAGNOSIS — Z79899 Other long term (current) drug therapy: Secondary | ICD-10-CM | POA: Diagnosis not present

## 2020-08-26 DIAGNOSIS — M48061 Spinal stenosis, lumbar region without neurogenic claudication: Secondary | ICD-10-CM | POA: Diagnosis not present

## 2020-08-26 DIAGNOSIS — F4310 Post-traumatic stress disorder, unspecified: Secondary | ICD-10-CM

## 2020-08-26 DIAGNOSIS — M25311 Other instability, right shoulder: Secondary | ICD-10-CM | POA: Diagnosis not present

## 2020-08-26 DIAGNOSIS — Z9889 Other specified postprocedural states: Secondary | ICD-10-CM | POA: Diagnosis not present

## 2020-08-26 DIAGNOSIS — M85851 Other specified disorders of bone density and structure, right thigh: Secondary | ICD-10-CM | POA: Diagnosis not present

## 2020-08-26 DIAGNOSIS — S43431D Superior glenoid labrum lesion of right shoulder, subsequent encounter: Secondary | ICD-10-CM | POA: Diagnosis not present

## 2020-08-26 MED ORDER — MIRTAZAPINE 15 MG PO TABS: 15.0000 mg | ORAL_TABLET | Freq: Every day | ORAL | 1 refills | Status: DC

## 2020-08-26 NOTE — Telephone Encounter (Signed)
Received written note from patient that nortriptyline is causing her to have electric shock sensations in her arms and legs and she stopped taking it.  She asks if something can be added to address anxiety and stress, to include headaches and stomach aches from stress.  She also notes that she is not sleeping well. Will send in script for Remeron since this may be helpful for her mood, anxiety, sleep, and stomach discomfort and tends to be well tolerated.

## 2020-08-26 NOTE — Telephone Encounter (Signed)
Pt has been informed.

## 2020-08-27 NOTE — Progress Notes (Signed)
Subjective: 65 year old female presents to the office today for follow-up evaluation of right foot pain.  She is asking for steroid injections today.  She again points to the second MPJ where she has majority discomfort.  She denies any recent injury or trauma.  She also feels the orthotics are not doing much.  No recent injury or falls or changes otherwise since I last saw her. Denies any systemic complaints such as fevers, chills, nausea, vomiting.   Objective: AAO x3, NAD DP/PT pulses palpable bilaterally, CRT less than 3 seconds There is no significant discomfort with first MPJ range of motion although restriction is noted.  Majority tenderness is back on the second MPJ.  There is no edema, erythema.  No area of pinpoint tenderness.  Flexor, extensor tendons appear to be intact.  MMT 5/5.  No areas of discomfort identified today. No pain with calf compression, swelling, warmth, erythema  Assessment: Capsulitis right foot  Plan: -All treatment options discussed with the patient including all alternatives, risks, complications.  -Steroid injection performed on second MPJ today.  Skin was prepped with Betadine, alcohol and mixture of 1 cc Kenalog 10, 0.5 cc of Marcaine plain, 0.5 cc lidocaine plain was infiltrated around the second MPJ.  Care was taken not to directly infiltrated on the implant.  She tolerated the procedure without any complications.  Was injection care discussed. -I modified orthotics.  I removed the pad that she had and I placed a metatarsal offloading pad.  If needed I can send this back to incorporate this. -We discussed surgical intervention to shorten second metatarsal.  Trula Slade DPM

## 2020-08-28 DIAGNOSIS — Z20822 Contact with and (suspected) exposure to covid-19: Secondary | ICD-10-CM | POA: Diagnosis not present

## 2020-08-28 DIAGNOSIS — M25311 Other instability, right shoulder: Secondary | ICD-10-CM | POA: Diagnosis not present

## 2020-08-28 DIAGNOSIS — M25511 Pain in right shoulder: Secondary | ICD-10-CM | POA: Diagnosis not present

## 2020-08-28 DIAGNOSIS — S43431D Superior glenoid labrum lesion of right shoulder, subsequent encounter: Secondary | ICD-10-CM | POA: Diagnosis not present

## 2020-08-28 DIAGNOSIS — Z9889 Other specified postprocedural states: Secondary | ICD-10-CM | POA: Diagnosis not present

## 2020-08-29 DIAGNOSIS — S43431D Superior glenoid labrum lesion of right shoulder, subsequent encounter: Secondary | ICD-10-CM | POA: Diagnosis not present

## 2020-08-29 DIAGNOSIS — Z9889 Other specified postprocedural states: Secondary | ICD-10-CM | POA: Diagnosis not present

## 2020-08-29 DIAGNOSIS — M25511 Pain in right shoulder: Secondary | ICD-10-CM | POA: Diagnosis not present

## 2020-08-29 DIAGNOSIS — M25311 Other instability, right shoulder: Secondary | ICD-10-CM | POA: Diagnosis not present

## 2020-09-09 DIAGNOSIS — M25511 Pain in right shoulder: Secondary | ICD-10-CM | POA: Diagnosis not present

## 2020-09-09 DIAGNOSIS — S43431D Superior glenoid labrum lesion of right shoulder, subsequent encounter: Secondary | ICD-10-CM | POA: Diagnosis not present

## 2020-09-09 DIAGNOSIS — Z9889 Other specified postprocedural states: Secondary | ICD-10-CM | POA: Diagnosis not present

## 2020-09-09 DIAGNOSIS — M25311 Other instability, right shoulder: Secondary | ICD-10-CM | POA: Diagnosis not present

## 2020-09-09 NOTE — Progress Notes (Addendum)
Surgical Instructions    Your procedure is scheduled on Thursday, March 31st, 2022  Report to [facility] Main Entrance "A" at 05:30 A.M., then check in with the Admitting office.  Call this number if you have problems the morning of surgery:  [phone]   If you have any questions prior to your surgery date call [phone]: Open Monday-Friday 8am-4pm    Remember:  Do not eat after midnight the night before your surgery  You may drink clear liquids until 04:30 the morning of your surgery.   Clear liquids allowed are: Water, Non-Citrus Juices (without pulp), Carbonated Beverages, Clear Tea, Black Coffee Only, and Gatorade  Patient Instructions  . The night before surgery:  o No food after midnight. ONLY clear liquids after midnight  . The day of surgery (if you do NOT have diabetes):  o Drink ONE (1) Pre-Surgery Clear Ensure by 04:30 the morning of surgery. Drink in one sitting. Do not sip.  o This drink was given to you during your hospital  pre-op appointment visit.  o Nothing else to drink after completing the  Pre-Surgery Clear Ensure.          If you have questions, please contact your surgeon's office.     Take these medicines the morning of surgery with A SIP OF WATER   HYDROcodone-acetaminophen (NORCO) sulfamethoxazole-trimethoprim (BACTRIM DS  If needed:  baclofen (LIORESAL) Carboxymethylcellulose Sodium (THERATEARS OP) fluticasone (FLONASE) levocetirizine (XYZAL)   As of today, STOP taking any Aspirin (unless otherwise instructed by your surgeon) Aleve, Naproxen, Ibuprofen, Motrin, Advil, Goody's, BC's, all herbal medications, fish oil, and all vitamins.                     Do not wear jewelry, make up, or nail polish            Do not wear lotions, powders, perfumes, or deodorant.            Do not shave 48 hours prior to surgery.              Do not bring valuables to the hospital.            [facility] is not responsible for any belongings or  valuables.  Do NOT Smoke (Tobacco/Vaping) or drink Alcohol 24 hours prior to your procedure If you use a CPAP at night, you may bring all equipment for your overnight stay.   Contacts, glasses, dentures or bridgework may not be worn into surgery, please bring cases for these belongings   For patients admitted to the hospital, discharge time will be determined by your treatment team.   Patients discharged the day of surgery will not be allowed to drive home, and someone needs to stay with them for 24 hours.    Special instructions:   [place]- Preparing For Surgery  Before surgery, you can play an important role. Because skin is not sterile, your skin needs to be as free of germs as possible. You can reduce the number of germs on your skin by washing with CHG (chlorahexidine gluconate) Soap before surgery.  CHG is an antiseptic cleaner which kills germs and bonds with the skin to continue killing germs even after washing.    Oral Hygiene is also important to reduce your risk of infection.  Remember - BRUSH YOUR TEETH THE MORNING OF SURGERY WITH YOUR REGULAR TOOTHPASTE  Please do not use if you have an allergy to CHG or antibacterial soaps. If your skin  becomes reddened/irritated stop using the CHG.  Do not shave (including legs and underarms) for at least 48 hours prior to first CHG shower. It is OK to shave your face.  Please follow these instructions carefully.   1. Shower the NIGHT BEFORE SURGERY and the MORNING OF SURGERY  2. If you chose to wash your hair, wash your hair first as usual with your normal shampoo.  3. After you shampoo, rinse your hair and body thoroughly to remove the shampoo.   4. Use CHG Soap as you would any other liquid soap. You can apply CHG directly to the skin and wash gently with a scrungie or a clean washcloth.   5. Apply the CHG Soap to your body ONLY FROM THE NECK DOWN.  Do not use on open wounds or open sores. Avoid contact with your eyes, ears,  mouth and genitals (private parts). Wash Face and genitals (private parts)  with your normal soap.   6. Wash thoroughly, paying special attention to the area where your surgery will be performed.  7. Thoroughly rinse your body with warm water from the neck down.  8. DO NOT shower/wash with your normal soap after using and rinsing off the CHG Soap.  9. Pat yourself dry with a CLEAN TOWEL.  10. Wear CLEAN PAJAMAS to bed the night before surgery  11. Place CLEAN SHEETS on your bed the night before your surgery  12. DO NOT SLEEP WITH PETS.   Day of Surgery: Shower with CHG soap. Wear Clean/Comfortable clothing the morning of surgery Do not apply any deodorants/lotions.   Remember to brush your teeth WITH YOUR REGULAR TOOTHPASTE.   Please read over the following fact sheets that you were given.

## 2020-09-10 ENCOUNTER — Encounter (HOSPITAL_COMMUNITY): Payer: Self-pay

## 2020-09-10 ENCOUNTER — Ambulatory Visit (INDEPENDENT_AMBULATORY_CARE_PROVIDER_SITE_OTHER): Payer: PPO | Admitting: Psychiatry

## 2020-09-10 ENCOUNTER — Encounter (HOSPITAL_COMMUNITY)
Admission: RE | Admit: 2020-09-10 | Discharge: 2020-09-10 | Disposition: A | Discharge status: Home / Self Care | Payer: PPO | Source: Ambulatory Visit | Attending: Orthopedic Surgery | Admitting: Orthopedic Surgery

## 2020-09-10 ENCOUNTER — Encounter: Payer: Self-pay | Admitting: Psychiatry

## 2020-09-10 ENCOUNTER — Other Ambulatory Visit (HOSPITAL_COMMUNITY): Payer: Federal, State, Local not specified - PPO

## 2020-09-10 ENCOUNTER — Other Ambulatory Visit: Payer: Self-pay

## 2020-09-10 DIAGNOSIS — F4310 Post-traumatic stress disorder, unspecified: Secondary | ICD-10-CM | POA: Diagnosis not present

## 2020-09-10 DIAGNOSIS — M25311 Other instability, right shoulder: Secondary | ICD-10-CM | POA: Diagnosis not present

## 2020-09-10 DIAGNOSIS — Z01812 Encounter for preprocedural laboratory examination: Secondary | ICD-10-CM | POA: Diagnosis not present

## 2020-09-10 DIAGNOSIS — Z20822 Contact with and (suspected) exposure to covid-19: Secondary | ICD-10-CM | POA: Insufficient documentation

## 2020-09-10 DIAGNOSIS — M25511 Pain in right shoulder: Secondary | ICD-10-CM | POA: Diagnosis not present

## 2020-09-10 DIAGNOSIS — Z9889 Other specified postprocedural states: Secondary | ICD-10-CM | POA: Diagnosis not present

## 2020-09-10 DIAGNOSIS — S43431D Superior glenoid labrum lesion of right shoulder, subsequent encounter: Secondary | ICD-10-CM | POA: Diagnosis not present

## 2020-09-10 DIAGNOSIS — F331 Major depressive disorder, recurrent, moderate: Secondary | ICD-10-CM | POA: Diagnosis not present

## 2020-09-10 HISTORY — DX: Gastro-esophageal reflux disease without esophagitis: K21.9

## 2020-09-10 LAB — URINALYSIS, ROUTINE W REFLEX MICROSCOPIC
Bacteria, UA: NONE SEEN
Bilirubin Urine: NEGATIVE
Glucose, UA: NEGATIVE mg/dL
Ketones, ur: NEGATIVE mg/dL
Leukocytes,Ua: NEGATIVE
Nitrite: NEGATIVE
Protein, ur: NEGATIVE mg/dL
Specific Gravity, Urine: 1.01 (ref 1.005–1.030)
pH: 7 (ref 5.0–8.0)

## 2020-09-10 LAB — CBC WITH DIFFERENTIAL/PLATELET
Abs Immature Granulocytes: 0.02 10*3/uL (ref 0.00–0.07)
Basophils Absolute: 0 10*3/uL (ref 0.0–0.1)
Basophils Relative: 0 %
Eosinophils Absolute: 0.1 10*3/uL (ref 0.0–0.5)
Eosinophils Relative: 1 %
HCT: 42.9 % (ref 36.0–46.0)
Hemoglobin: 13.7 g/dL (ref 12.0–15.0)
Immature Granulocytes: 0 %
Lymphocytes Relative: 30 %
Lymphs Abs: 2 10*3/uL (ref 0.7–4.0)
MCH: 30.9 pg (ref 26.0–34.0)
MCHC: 31.9 g/dL (ref 30.0–36.0)
MCV: 96.6 fL (ref 80.0–100.0)
Monocytes Absolute: 0.3 10*3/uL (ref 0.1–1.0)
Monocytes Relative: 5 %
Neutro Abs: 4.4 10*3/uL (ref 1.7–7.7)
Neutrophils Relative %: 64 %
Platelets: 278 10*3/uL (ref 150–400)
RBC: 4.44 MIL/uL (ref 3.87–5.11)
RDW: 13 % (ref 11.5–15.5)
WBC: 6.8 10*3/uL (ref 4.0–10.5)
nRBC: 0 % (ref 0.0–0.2)

## 2020-09-10 LAB — SARS CORONAVIRUS 2 (TAT 6-24 HRS): SARS Coronavirus 2: NEGATIVE

## 2020-09-10 LAB — COMPREHENSIVE METABOLIC PANEL
ALT: 28 U/L (ref 0–44)
AST: 31 U/L (ref 15–41)
Albumin: 4.1 g/dL (ref 3.5–5.0)
Alkaline Phosphatase: 68 U/L (ref 38–126)
Anion gap: 7 (ref 5–15)
BUN: 9 mg/dL (ref 8–23)
CO2: 26 mmol/L (ref 22–32)
Calcium: 9.6 mg/dL (ref 8.9–10.3)
Chloride: 108 mmol/L (ref 98–111)
Creatinine, Ser: 0.76 mg/dL (ref 0.44–1.00)
GFR, Estimated: >60 mL/min (ref >60)
Glucose, Bld: 85 mg/dL (ref 70–99)
Potassium: 3.9 mmol/L (ref 3.5–5.1)
Sodium: 141 mmol/L (ref 135–145)
Total Bilirubin: 0.6 mg/dL (ref 0.3–1.2)
Total Protein: 7.5 g/dL (ref 6.5–8.1)

## 2020-09-10 LAB — TYPE AND SCREEN
ABO/RH(D): A POS
Antibody Screen: NEGATIVE

## 2020-09-10 LAB — PROTIME-INR
INR: 1 (ref 0.8–1.2)
Prothrombin Time: 12.6 seconds (ref 11.4–15.2)

## 2020-09-10 LAB — APTT: aPTT: 29 seconds (ref 24–36)

## 2020-09-10 MED ORDER — MIRTAZAPINE 15 MG PO TABS: 15.0000 mg | ORAL_TABLET | Freq: Every day | ORAL | 0 refills | Status: DC

## 2020-09-10 NOTE — Progress Notes (Signed)
Sharon Logan [id number] March 16, 1956 65 y.o.  Subjective:   Patient ID:  Sharon Logan is a 65 y.o. (DOB July 16, 1955) female.  Chief Complaint:  Chief Complaint  Patient presents with  . Follow-up    Anxiety, depression, and insomnia    HPI Sharon Logan presents to the office today for follow-up of anxiety, depression, and insomnia. She reports that Remeron has been helping with the anxiety and stress. She reports that she has not experienced any electric shock sensations with Remeron like she has had with other antidepressants. She reports that she has only had one episode of anxiety in the last 2 weeks and was able to manage this. She reports that she is noticing an increase in appetite. She reports that Remeron has been helping with sleep initiation. Sleeping about 6 hours a night. Denies any nightmares. She reports that there is some sadness "but it is not nagging depression." Energy and motivation have been low. She reports some increase in enjoyment in things. Concentration has been ok. She reports, "I haven't been a scatter brain." She reports concentration has improved. Denies SI.   Past Medication Trials: Lexapro- "My anti-depressant of choice" and gives her energy. Was taking 2-3 years prior to surgery. Re-started one week ago. Has taken 10 mg and 20 mg doses in the past. Thinks it may have helped with pain in the past.  Cymbalta- Started after back surgery. Has made her tired, even when reduced to 30 mg. Was having to take 2 hour naps. May have helped slightly with pain. Had HA, Constipation, Nausea Prozac- Constipation. Causes her to feel cold. Celexa Lexapro Zoloft- worsening mood Effexor-HA, helped with excessive sweating. Paxil Wellbutrin- Did not cause HA's. Tolerated well and was effective for depression.  Amitriptyline- Did not cause HA's. Weight gain. Effective.  Nortriptyline- Did not cause HA's. Effective. Lamictal- Rash Gabapentin -Rhabomyolysis Lyrica- Adverse  reaction Buspar Trazodone Ambien- Parasomnia Lunesta- Tolerated Clonidine  Flowsheet Row Pre-Admission Testing 60 from 09/10/2020 in [facility] PREADMISSION TESTING  C-SSRS RISK CATEGORY No Risk       Review of Systems:  Review of Systems  Gastrointestinal:       Heartburn. Some nausea  Musculoskeletal: Positive for back pain. Negative for gait problem.  Neurological: Negative for tremors and headaches.  Psychiatric/Behavioral:       Please refer to HPI   She reports that she is trying to gradually reduce Hydrocodone-acetaminophen. She reports that she is currently taking it once daily.  Having screws removed from hip on Thursday. Daughter lives close by and will be checking on her and taking care.   Medications: I have reviewed the patient's current medications.  Current Outpatient Medications  Medication Sig Dispense Refill  . b complex vitamins capsule Take 1 capsule by mouth daily.    . baclofen (LIORESAL) 10 MG tablet Take 10 mg by mouth every 8 (eight) hours as needed for muscle spasms.    . Carboxymethylcellulose Sodium (THERATEARS OP) Apply 1 drop to eye 4 (four) times daily as needed (dry eyes).    . cholecalciferol (VITAMIN D) 25 MCG (1000 UNIT) tablet Take 2,000 Units by mouth in the morning.    . fluticasone (FLONASE) 50 MCG/ACT nasal spray Place 2 sprays into both nostrils 2 (two) times daily as needed for allergies or rhinitis.    Marland Kitchen HYDROcodone-acetaminophen (NORCO) 10-325 MG tablet Take 1 tablet by mouth daily.    Marland Kitchen levocetirizine (XYZAL) 5 MG tablet Take 5 mg by mouth at  bedtime as needed for allergies.    . Nutritional Supplements (WOMENS FORMULA MENOPAUSE PO) Take 2 capsules by mouth in the morning and at bedtime.    . rizatriptan (MAXALT) 10 MG tablet Take 10 mg by mouth daily as needed for migraine.     . sulfamethoxazole-trimethoprim (BACTRIM DS) 800-160 MG tablet Take 1 tablet by mouth 2 (two) times daily.    . mirtazapine (REMERON) 15 MG  tablet Take 1 tablet (15 mg total) by mouth at bedtime. 90 tablet 0   No current facility-administered medications for this visit.    Medication Side Effects: Other: Increase in appetite  Allergies:  Allergies  Allergen Reactions  . Gabapentin Other (See Comments)  . Polymyxin B Other (See Comments)    Eyes go blood red  . Cymbalta [Duloxetine Hcl] Other (See Comments)    Headaches, constipation  . Duloxetine Other (See Comments)    HEADACHES CONSTIPATION  . Lamotrigine Rash  . Other Other (See Comments)    OTOBIOTIC > RED EYES  . Pregabalin Rash    Itchy red rash on chest  . Prozac [Fluoxetine] Other (See Comments)    CONSTIPATION  . Zolpidem Rash    "sleep walk"    Past Medical History:  Diagnosis Date  . Allergic rhinitis   . Anxiety   . Arthritis   . Cancer ([place])    skin  . Chronic pain   . Chronically dry eyes   . Depression   . Fibromyalgia   . GERD (gastroesophageal reflux disease)   . Headache   . Vitamin D deficiency   . Wears glasses     Family History  Problem Relation Age of Onset  . Depression Mother   . Heart attack Mother   . COPD Mother   . Depression Father   . Heart attack Father   . ADD / ADHD Son     Social History   Socioeconomic History  . Marital status: Widowed    Spouse name: Not on file  . Number of children: Not on file  . Years of education: Not on file  . Highest education level: Not on file  Occupational History  . Not on file  Tobacco Use  . Smoking status: Never Smoker  . Smokeless tobacco: Never Used  Vaping Use  . Vaping Use: Never used  Substance and Sexual Activity  . Alcohol use: No  . Drug use: No  . Sexual activity: Not on file  Other Topics Concern  . Not on file  Social History Narrative   Right handed   Social Determinants of Health   Financial Resource Strain: Not on file  Food Insecurity: Not on file  Transportation Needs: Not on file  Physical Activity: Not on file  Stress: Not on file   Social Connections: Not on file  Intimate Partner Violence: Not on file    Past Medical History, Surgical history, Social history, and Family history were reviewed and updated as appropriate.   Please see review of systems for further details on the patient's review from today.   Objective:   Physical Exam:  Wt 142 lb (64.4 kg)   BMI 24.37 kg/m   Physical Exam Constitutional:      General: She is not in acute distress. Musculoskeletal:        General: No deformity.  Neurological:     Mental Status: She is alert and oriented to person, place, and time.     Coordination: Coordination normal.  Psychiatric:  Attention and Perception: Attention and perception normal. She does not perceive auditory or visual hallucinations.        Mood and Affect: Mood normal. Mood is not anxious or depressed. Affect is not labile, blunt, angry or inappropriate.        Speech: Speech normal.        Behavior: Behavior normal.        Thought Content: Thought content normal. Thought content is not paranoid or delusional. Thought content does not include homicidal or suicidal ideation. Thought content does not include homicidal or suicidal plan.        Cognition and Memory: Cognition and memory normal.        Judgment: Judgment normal.     Comments: Insight intact     Lab Review:     Component Value Date/Time   NA 138 06/29/2019 1156   K 3.5 06/29/2019 1156   CL 106 06/26/2019 1008   CO2 24 06/26/2019 1008   GLUCOSE 98 06/26/2019 1008   BUN 20 06/26/2019 1008   CREATININE 0.84 06/26/2019 1008   CALCIUM 9.6 06/26/2019 1008   PROT 6.9 06/26/2019 1008   ALBUMIN 4.0 06/26/2019 1008   AST 22 06/26/2019 1008   ALT 15 06/26/2019 1008   ALKPHOS 75 06/26/2019 1008   BILITOT 0.5 06/26/2019 1008   GFRNONAA >60 06/26/2019 1008   GFRAA >60 06/26/2019 1008       Component Value Date/Time   WBC 6.8 09/10/2020 0918   RBC 4.44 09/10/2020 0918   HGB 13.7 09/10/2020 0918   HCT 42.9 09/10/2020  0918   PLT 278 09/10/2020 0918   MCV 96.6 09/10/2020 0918   MCH 30.9 09/10/2020 0918   MCHC 31.9 09/10/2020 0918   RDW 13.0 09/10/2020 0918   LYMPHSABS 2.0 09/10/2020 0918   MONOABS 0.3 09/10/2020 0918   EOSABS 0.1 09/10/2020 0918   BASOSABS 0.0 09/10/2020 0918    No results found for: POCLITH, LITHIUM   No results found for: PHENYTOIN, PHENOBARB, VALPROATE, CBMZ   .res Assessment: Plan:   Pt wishes to continue Remeron 15 mg po QHS since this has been helpful for her mood, anxiety, and insomnia. She reports concern about increase in appetite and reports that if wt gain occurs she may wish to discontinue Remeron. Discussed that increased appetite/wt gain can occasionally be less with higher doses and discussed potential benefits, risks, and side effects of increasing Remeron.  Will continue Remeron 15 mg po QHS for mood, anxiety, and insomnia at this time.  Pt to follow-up in 6 weeks or sooner if clinically indicated.  Patient advised to contact office with any questions, adverse effects, or acute worsening in signs and symptoms.  Sharon Logan was seen today for follow-up.  Diagnoses and all orders for this visit:  PTSD (post-traumatic stress disorder) -     mirtazapine (REMERON) 15 MG tablet; Take 1 tablet (15 mg total) by mouth at bedtime.  Moderate recurrent major depression (HCC) -     mirtazapine (REMERON) 15 MG tablet; Take 1 tablet (15 mg total) by mouth at bedtime.     Please see After Visit Summary for patient specific instructions.  Future Appointments  Date Time Provider [address]  09/11/2020  3:00 PM Barrie Folk, [place] [facility] None  09/24/2020  2:30 PM Pieter Partridge, DO [facility] None  10/03/2020  1:45 PM Trula Slade, DPM [facility]  10/08/2020 10:00 AM Raulkar, Clide Deutscher, MD [facility] CPR  10/25/2020 10:30 AM Thayer Headings, PMHNP [facility]  None    No orders of the defined types were placed in this encounter.   -------------------------------

## 2020-09-10 NOTE — Progress Notes (Incomplete)
PCP - Marda Stalker, PA-C Cardiologist - denies  PPM/ICD - denies Device Orders - denies Rep Notified - denies  Chest x-ray - n/a EKG -  Stress Test - 10/18/2017 ECHO - denies Cardiac Cath - denies  Sleep Study - denies CPAP - n/a  Blood Thinner Instructions: n/a Aspirin Instructions: Patient was instructed to STOP taking any Aspirin (unless otherwise instructed by your surgeon) Aleve, Naproxen, Ibuprofen, Motrin, Advil, Goody's, BC's, all herbal medications, fish oil, and all vitamins.  ERAS Protcol - yes PRE-SURGERY Ensure   COVID TEST- 09/10/2020   Anesthesia review: no  Patient denies shortness of breath, fever, cough and chest pain at PAT appointment   All instructions explained to the patient, with a verbal understanding of the material. Patient agrees to go over the instructions while at home for a better understanding. Patient also instructed to self quarantine after being tested for COVID-19. The opportunity to ask questions was provided.

## 2020-09-11 ENCOUNTER — Ambulatory Visit: Payer: PPO | Admitting: Psychology

## 2020-09-11 ENCOUNTER — Ambulatory Visit: Payer: Federal, State, Local not specified - PPO | Admitting: Psychology

## 2020-09-11 DIAGNOSIS — Z9889 Other specified postprocedural states: Secondary | ICD-10-CM | POA: Diagnosis not present

## 2020-09-11 DIAGNOSIS — S43431D Superior glenoid labrum lesion of right shoulder, subsequent encounter: Secondary | ICD-10-CM | POA: Diagnosis not present

## 2020-09-11 DIAGNOSIS — M25511 Pain in right shoulder: Secondary | ICD-10-CM | POA: Diagnosis not present

## 2020-09-11 DIAGNOSIS — M25311 Other instability, right shoulder: Secondary | ICD-10-CM | POA: Diagnosis not present

## 2020-09-11 NOTE — Anesthesia Preprocedure Evaluation (Addendum)
Anesthesia Evaluation  Patient identified by MRN, date of birth, ID band Patient awake    Reviewed: Allergy & Precautions, NPO status , Patient's Chart, lab work & pertinent test results  Airway Mallampati: II  TM Distance: >3 FB Neck ROM: Full    Dental no notable dental hx. (+) Teeth Intact, Missing,    Pulmonary neg pulmonary ROS,    Pulmonary exam normal breath sounds clear to auscultation       Cardiovascular negative cardio ROS Normal cardiovascular exam Rhythm:Regular Rate:Normal     Neuro/Psych  Headaches, PSYCHIATRIC DISORDERS Anxiety Depression  Neuromuscular disease    GI/Hepatic Neg liver ROS,   Endo/Other  negative endocrine ROS  Renal/GU negative Renal ROS  negative genitourinary   Musculoskeletal  (+) Arthritis , Osteoarthritis,  Fibromyalgia -  Abdominal   Peds  Hematology negative hematology ROS (+)   Anesthesia Other Findings   Reproductive/Obstetrics                            Anesthesia Physical  Anesthesia Plan  ASA: II  Anesthesia Plan: General   Post-op Pain Management:    Induction: Intravenous  PONV Risk Score and Plan: 4 or greater and Ondansetron, Dexamethasone and Midazolam  Airway Management Planned: Oral ETT, LMA and Video Laryngoscope Planned  Additional Equipment: None  Intra-op Plan:   Post-operative Plan: Extubation in OR  Informed Consent: I have reviewed the patients History and Physical, chart, labs and discussed the procedure including the risks, benefits and alternatives for the proposed anesthesia with the patient or authorized representative who has indicated his/her understanding and acceptance.     Dental advisory given  Plan Discussed with: CRNA and Anesthesiologist  Anesthesia Plan Comments:        Anesthesia Quick Evaluation

## 2020-09-12 ENCOUNTER — Encounter (HOSPITAL_COMMUNITY): Payer: Self-pay | Admitting: Orthopedic Surgery

## 2020-09-12 ENCOUNTER — Ambulatory Visit (HOSPITAL_COMMUNITY)
Admission: RE | Admit: 2020-09-12 | Discharge: 2020-09-12 | Disposition: A | Discharge status: Home / Self Care | Payer: PPO | Source: Ambulatory Visit | Attending: Orthopedic Surgery | Admitting: Orthopedic Surgery

## 2020-09-12 ENCOUNTER — Inpatient Hospital Stay (HOSPITAL_COMMUNITY): Payer: PPO | Admitting: Certified Registered Nurse Anesthetist

## 2020-09-12 ENCOUNTER — Inpatient Hospital Stay (HOSPITAL_COMMUNITY): Payer: PPO

## 2020-09-12 ENCOUNTER — Ambulatory Visit (HOSPITAL_COMMUNITY)
Admission: RE | Disposition: A | Discharge status: Home / Self Care | Payer: Self-pay | Source: Ambulatory Visit | Attending: Orthopedic Surgery

## 2020-09-12 DIAGNOSIS — E559 Vitamin D deficiency, unspecified: Secondary | ICD-10-CM | POA: Diagnosis not present

## 2020-09-12 DIAGNOSIS — Z79899 Other long term (current) drug therapy: Secondary | ICD-10-CM | POA: Insufficient documentation

## 2020-09-12 DIAGNOSIS — Z888 Allergy status to other drugs, medicaments and biological substances status: Secondary | ICD-10-CM | POA: Diagnosis not present

## 2020-09-12 DIAGNOSIS — Z9889 Other specified postprocedural states: Secondary | ICD-10-CM

## 2020-09-12 DIAGNOSIS — T8484XA Pain due to internal orthopedic prosthetic devices, implants and grafts, initial encounter: Secondary | ICD-10-CM | POA: Diagnosis not present

## 2020-09-12 DIAGNOSIS — Y839 Surgical procedure, unspecified as the cause of abnormal reaction of the patient, or of later complication, without mention of misadventure at the time of the procedure: Secondary | ICD-10-CM | POA: Diagnosis not present

## 2020-09-12 DIAGNOSIS — Z981 Arthrodesis status: Secondary | ICD-10-CM | POA: Diagnosis not present

## 2020-09-12 DIAGNOSIS — Z90710 Acquired absence of both cervix and uterus: Secondary | ICD-10-CM | POA: Diagnosis not present

## 2020-09-12 DIAGNOSIS — Z85828 Personal history of other malignant neoplasm of skin: Secondary | ICD-10-CM | POA: Insufficient documentation

## 2020-09-12 DIAGNOSIS — K219 Gastro-esophageal reflux disease without esophagitis: Secondary | ICD-10-CM | POA: Diagnosis not present

## 2020-09-12 DIAGNOSIS — Z419 Encounter for procedure for purposes other than remedying health state, unspecified: Secondary | ICD-10-CM

## 2020-09-12 DIAGNOSIS — Z91041 Radiographic dye allergy status: Secondary | ICD-10-CM | POA: Diagnosis not present

## 2020-09-12 DIAGNOSIS — M4327 Fusion of spine, lumbosacral region: Secondary | ICD-10-CM | POA: Diagnosis not present

## 2020-09-12 DIAGNOSIS — G43909 Migraine, unspecified, not intractable, without status migrainosus: Secondary | ICD-10-CM | POA: Diagnosis not present

## 2020-09-12 HISTORY — PX: SACROILIAC JOINT FUSION: SHX6088

## 2020-09-12 IMAGING — RF DG SI JOINTS 3+V
1 series · 2 of 2 positions shown · non-contrast
Comparison: CT [DATE].

CLINICAL DATA: SI screw removal.

EXAM:
DG C-ARM 1-60 MIN; BILATERAL SACROILIAC JOINTS - 3+ VIEW
FLUOROSCOPY TIME:  Fluoroscopy Time:  10.7 seconds.
Number of Acquired Spot Images: 2

[Series 1: run · 2 of 2 slices shown]
[im 1/2]
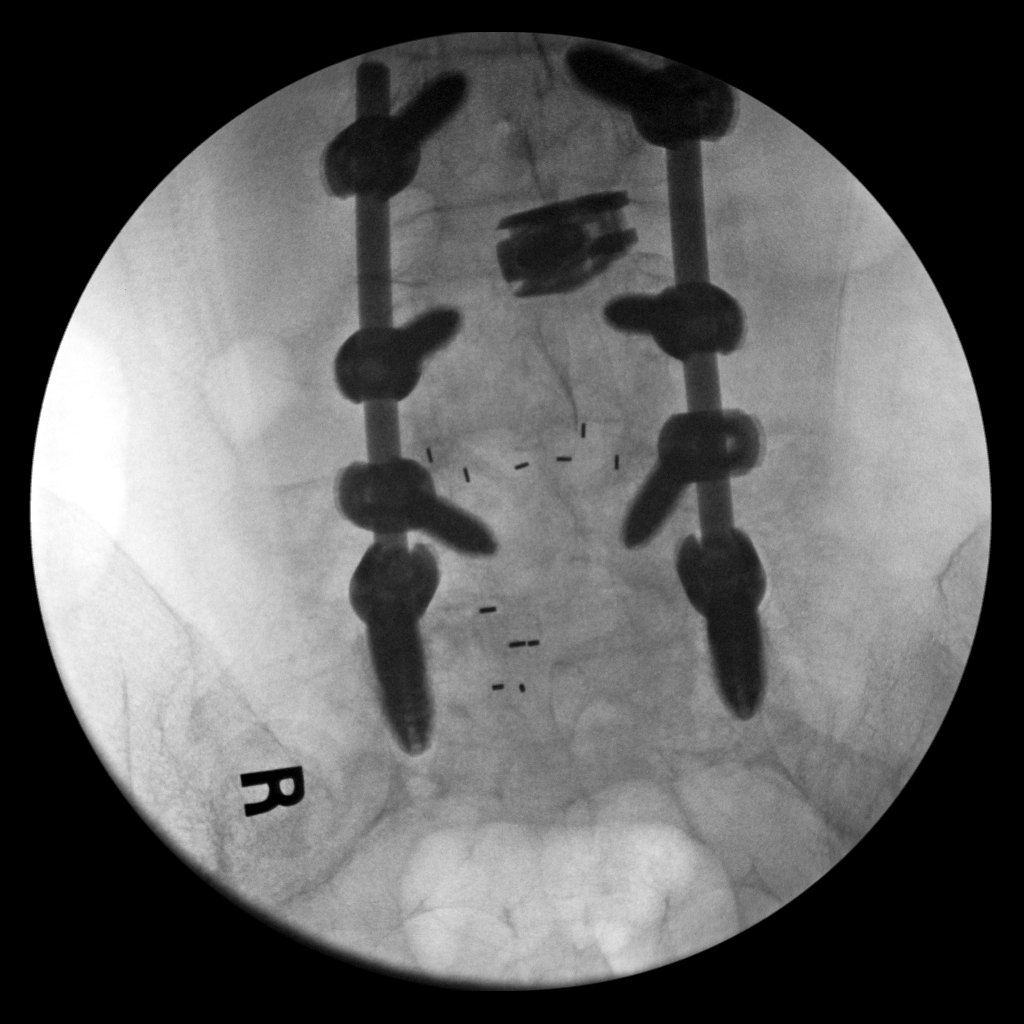
[im 2/2]
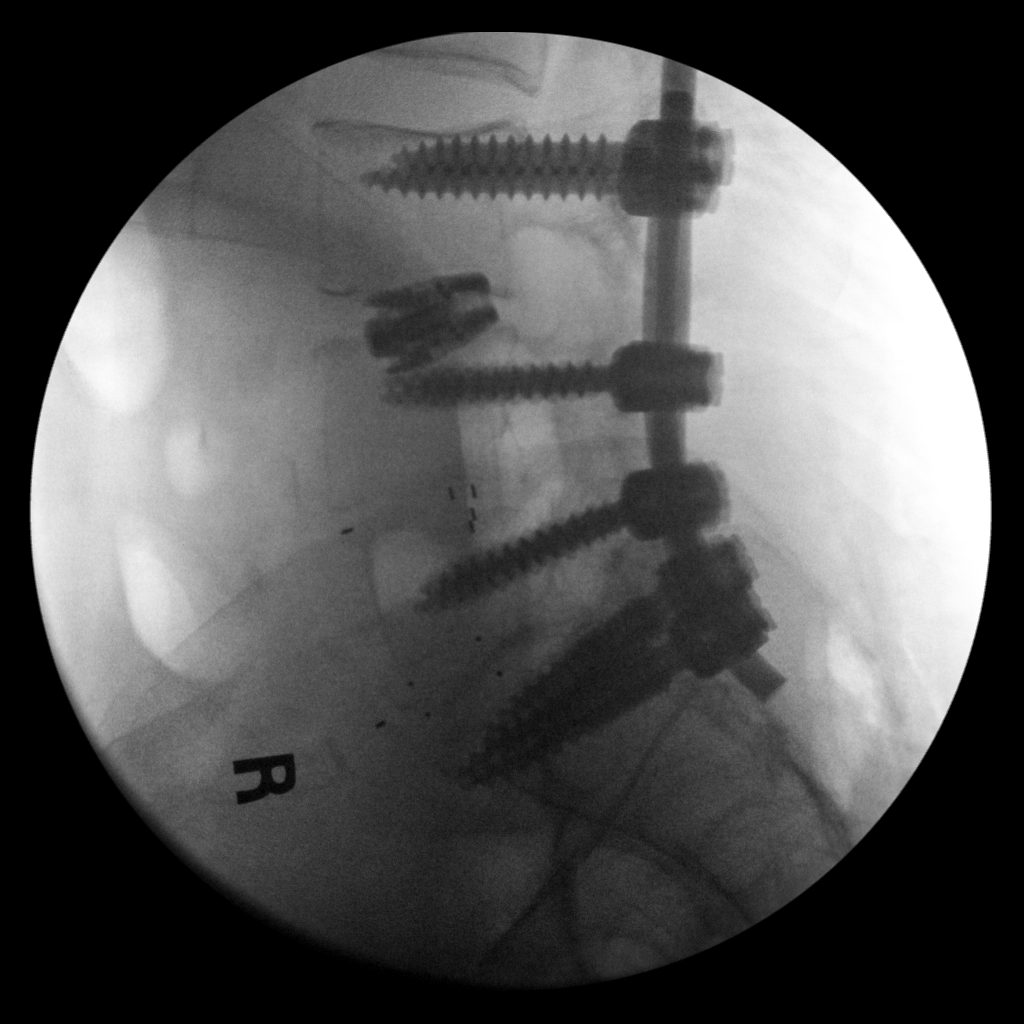

[2 of 2 positions shown; findings below may reference images not displayed]

FINDINGS: Two C-arm fluoroscopic images were obtained intraoperatively and
submitted for post operative interpretation. These images
demonstrate lumbosacral fusion with interval removal of the
sacroiliac screws bilaterally. Please see the performing provider's
procedural report for further detail.
IMPRESSION: Intraoperative fluoroscopy, as detailed above.

## 2020-09-12 SURGERY — SACROILIAC JOINT FUSION
Anesthesia: General | Site: Pelvis | Laterality: Bilateral

## 2020-09-12 MED ORDER — 0.9 % SODIUM CHLORIDE (POUR BTL) OPTIME
TOPICAL | Status: DC | PRN
  Administered 2020-09-12 (×2): 1000 mL

## 2020-09-12 MED ORDER — LACTATED RINGERS IV SOLN: INTRAVENOUS | Status: DC

## 2020-09-12 MED ORDER — BUPIVACAINE LIPOSOME 1.3 % IJ SUSP
INTRAMUSCULAR | Status: DC | PRN
  Administered 2020-09-12: 10 mL

## 2020-09-12 MED ORDER — FENTANYL CITRATE (PF) 100 MCG/2ML IJ SOLN
25.0000 ug | INTRAMUSCULAR | Status: DC | PRN
Start: 2020-09-12 — End: 2020-09-12

## 2020-09-12 MED ORDER — BUPIVACAINE-EPINEPHRINE (PF) 0.25% -1:200000 IJ SOLN
INTRAMUSCULAR | Status: DC | PRN
  Administered 2020-09-12 (×2): 10 mL

## 2020-09-12 MED ORDER — BUPIVACAINE HCL (PF) 0.25 % IJ SOLN
INTRAMUSCULAR | Status: AC
  Filled 2020-09-12: qty 30

## 2020-09-12 MED ORDER — ROCURONIUM BROMIDE 10 MG/ML (PF) SYRINGE
PREFILLED_SYRINGE | INTRAVENOUS | Status: AC
  Filled 2020-09-12: qty 10

## 2020-09-12 MED ORDER — MIDAZOLAM HCL 2 MG/2ML IJ SOLN
INTRAMUSCULAR | Status: AC
  Filled 2020-09-12: qty 2

## 2020-09-12 MED ORDER — FENTANYL CITRATE (PF) 250 MCG/5ML IJ SOLN
INTRAMUSCULAR | Status: DC | PRN
  Administered 2020-09-12: 25 ug via INTRAVENOUS
  Administered 2020-09-12: 100 ug via INTRAVENOUS
  Administered 2020-09-12 (×2): 25 ug via INTRAVENOUS

## 2020-09-12 MED ORDER — ACETAMINOPHEN 325 MG PO TABS: 325.0000 mg | ORAL_TABLET | ORAL | Status: DC | PRN

## 2020-09-12 MED ORDER — CHLORHEXIDINE GLUCONATE 0.12 % MT SOLN
15.0000 mL | Freq: Once | OROMUCOSAL | Status: AC
  Administered 2020-09-12: 15 mL via OROMUCOSAL
  Filled 2020-09-12: qty 15

## 2020-09-12 MED ORDER — HYDROCODONE-ACETAMINOPHEN 10-325 MG PO TABS: 1.0000 | ORAL_TABLET | Freq: Four times a day (QID) | ORAL | 0 refills | Status: AC | PRN

## 2020-09-12 MED ORDER — CEFAZOLIN SODIUM-DEXTROSE 2-4 GM/100ML-% IV SOLN
2.0000 g | INTRAVENOUS | Status: AC
  Administered 2020-09-12: 2 g via INTRAVENOUS
  Filled 2020-09-12: qty 100

## 2020-09-12 MED ORDER — BACITRACIN ZINC 500 UNIT/GM EX OINT
TOPICAL_OINTMENT | CUTANEOUS | Status: DC | PRN
  Administered 2020-09-12: 1 via TOPICAL

## 2020-09-12 MED ORDER — MIDAZOLAM HCL 5 MG/5ML IJ SOLN
INTRAMUSCULAR | Status: DC | PRN
  Administered 2020-09-12: 2 mg via INTRAVENOUS

## 2020-09-12 MED ORDER — ORAL CARE MOUTH RINSE: 15.0000 mL | Freq: Once | OROMUCOSAL | Status: AC

## 2020-09-12 MED ORDER — PHENYLEPHRINE HCL-NACL 10-0.9 MG/250ML-% IV SOLN
INTRAVENOUS | Status: DC | PRN
  Administered 2020-09-12: 25 ug/min via INTRAVENOUS

## 2020-09-12 MED ORDER — OXYCODONE HCL 5 MG/5ML PO SOLN: 5.0000 mg | Freq: Once | ORAL | Status: DC | PRN

## 2020-09-12 MED ORDER — THROMBIN 20000 UNITS EX SOLR
CUTANEOUS | Status: DC | PRN
  Administered 2020-09-12: 20 mL via TOPICAL

## 2020-09-12 MED ORDER — BUPIVACAINE LIPOSOME 1.3 % IJ SUSP
INTRAMUSCULAR | Status: AC
  Filled 2020-09-12: qty 20

## 2020-09-12 MED ORDER — FENTANYL CITRATE (PF) 250 MCG/5ML IJ SOLN
INTRAMUSCULAR | Status: AC
  Filled 2020-09-12: qty 5

## 2020-09-12 MED ORDER — DEXAMETHASONE SODIUM PHOSPHATE 10 MG/ML IJ SOLN
INTRAMUSCULAR | Status: AC
  Filled 2020-09-12: qty 1

## 2020-09-12 MED ORDER — DEXAMETHASONE SODIUM PHOSPHATE 10 MG/ML IJ SOLN
INTRAMUSCULAR | Status: DC | PRN
  Administered 2020-09-12: 5 mg via INTRAVENOUS

## 2020-09-12 MED ORDER — THROMBIN (RECOMBINANT) 20000 UNITS EX SOLR
CUTANEOUS | Status: AC
  Filled 2020-09-12: qty 20000

## 2020-09-12 MED ORDER — PROPOFOL 10 MG/ML IV BOLUS
INTRAVENOUS | Status: AC
  Filled 2020-09-12: qty 40

## 2020-09-12 MED ORDER — SUGAMMADEX SODIUM 200 MG/2ML IV SOLN
INTRAVENOUS | Status: DC | PRN
  Administered 2020-09-12: 200 mg via INTRAVENOUS

## 2020-09-12 MED ORDER — BACITRACIN ZINC 500 UNIT/GM EX OINT
TOPICAL_OINTMENT | CUTANEOUS | Status: AC
  Filled 2020-09-12: qty 28.35

## 2020-09-12 MED ORDER — EPHEDRINE SULFATE-NACL 50-0.9 MG/10ML-% IV SOSY
PREFILLED_SYRINGE | INTRAVENOUS | Status: DC | PRN
  Administered 2020-09-12 (×2): 5 mg via INTRAVENOUS

## 2020-09-12 MED ORDER — ROCURONIUM BROMIDE 100 MG/10ML IV SOLN
INTRAVENOUS | Status: DC | PRN
  Administered 2020-09-12: 50 mg via INTRAVENOUS
  Administered 2020-09-12: 10 mg via INTRAVENOUS

## 2020-09-12 MED ORDER — POVIDONE-IODINE 7.5 % EX SOLN: Freq: Once | CUTANEOUS | Status: DC

## 2020-09-12 MED ORDER — MEPERIDINE HCL 25 MG/ML IJ SOLN: 6.2500 mg | INTRAMUSCULAR | Status: DC | PRN

## 2020-09-12 MED ORDER — ACETAMINOPHEN 160 MG/5ML PO SOLN
325.0000 mg | ORAL | Status: DC | PRN
Start: 2020-09-12 — End: 2020-09-12

## 2020-09-12 MED ORDER — LIDOCAINE 2% (20 MG/ML) 5 ML SYRINGE
INTRAMUSCULAR | Status: AC
  Filled 2020-09-12: qty 5

## 2020-09-12 MED ORDER — ONDANSETRON HCL 4 MG/2ML IJ SOLN
INTRAMUSCULAR | Status: AC
  Filled 2020-09-12: qty 2

## 2020-09-12 MED ORDER — ONDANSETRON HCL 4 MG/2ML IJ SOLN
INTRAMUSCULAR | Status: DC | PRN
  Administered 2020-09-12: 4 mg via INTRAVENOUS

## 2020-09-12 MED ORDER — ONDANSETRON HCL 4 MG/2ML IJ SOLN: 4.0000 mg | Freq: Once | INTRAMUSCULAR | Status: DC | PRN

## 2020-09-12 MED ORDER — PROPOFOL 10 MG/ML IV BOLUS
INTRAVENOUS | Status: DC | PRN
  Administered 2020-09-12: 120 mg via INTRAVENOUS
  Administered 2020-09-12: 20 mg via INTRAVENOUS

## 2020-09-12 MED ORDER — LIDOCAINE 2% (20 MG/ML) 5 ML SYRINGE
INTRAMUSCULAR | Status: DC | PRN
  Administered 2020-09-12: 60 mg via INTRAVENOUS

## 2020-09-12 MED ORDER — THROMBIN (RECOMBINANT) 5000 UNITS EX SOLR
CUTANEOUS | Status: AC
  Filled 2020-09-12: qty 5000

## 2020-09-12 MED ORDER — OXYCODONE HCL 5 MG PO TABS
5.0000 mg | ORAL_TABLET | Freq: Once | ORAL | Status: DC | PRN
Start: 2020-09-12 — End: 2020-09-12

## 2020-09-12 MED ORDER — PHENYLEPHRINE 40 MCG/ML (10ML) SYRINGE FOR IV PUSH (FOR BLOOD PRESSURE SUPPORT)
PREFILLED_SYRINGE | INTRAVENOUS | Status: DC | PRN
  Administered 2020-09-12: 80 ug via INTRAVENOUS
  Administered 2020-09-12: 40 ug via INTRAVENOUS
  Administered 2020-09-12: 80 ug via INTRAVENOUS
  Administered 2020-09-12 (×2): 40 ug via INTRAVENOUS

## 2020-09-12 SURGICAL SUPPLY — 63 items
BENZOIN TINCTURE PRP APPL 2/3 (GAUZE/BANDAGES/DRESSINGS) ×2 IMPLANT
BLADE CLIPPER SURG (BLADE) ×2 IMPLANT
BLADE SURG 10 STRL SS (BLADE) ×2 IMPLANT
BUR DISC 0.8X25 (BURR) ×2 IMPLANT
CANISTER SUCT 3000ML PPV (MISCELLANEOUS) ×2 IMPLANT
CORD BIPOLAR FORCEPS 12FT (ELECTRODE) ×2 IMPLANT
COVER SURGICAL LIGHT HANDLE (MISCELLANEOUS) ×4 IMPLANT
DRAPE C-ARM 42X72 X-RAY (DRAPES) ×2 IMPLANT
DRAPE C-ARMOR (DRAPES) ×2 IMPLANT
DRAPE INCISE IOBAN 66X45 STRL (DRAPES) ×2 IMPLANT
DRAPE POUCH INSTRU U-SHP 10X18 (DRAPES) ×2 IMPLANT
DRAPE SURG 17X23 STRL (DRAPES) ×8 IMPLANT
DRESSING MEPILEX FLEX 4X4 (GAUZE/BANDAGES/DRESSINGS) ×1 IMPLANT
DRSG AQUACEL AG ADV 3.5X 6 (GAUZE/BANDAGES/DRESSINGS) ×4 IMPLANT
DRSG MEPILEX FLEX 4X4 (GAUZE/BANDAGES/DRESSINGS) ×2
DURAPREP 26ML APPLICATOR (WOUND CARE) ×2 IMPLANT
ELECT CAUTERY BLADE 6.4 (BLADE) ×2 IMPLANT
ELECT REM PT RETURN 9FT ADLT (ELECTROSURGICAL) ×2
ELECTRODE REM PT RTRN 9FT ADLT (ELECTROSURGICAL) ×1 IMPLANT
FILTER STRAW FLUID ASPIR (MISCELLANEOUS) ×2 IMPLANT
GAUZE 4X4 16PLY RFD (DISPOSABLE) IMPLANT
GAUZE SPONGE 4X4 12PLY STRL (GAUZE/BANDAGES/DRESSINGS) ×2 IMPLANT
GLOVE BIO SURGEON STRL SZ7 (GLOVE) ×2 IMPLANT
GLOVE BIO SURGEON STRL SZ8 (GLOVE) ×2 IMPLANT
GLOVE SRG 8 PF TXTR STRL LF DI (GLOVE) ×1 IMPLANT
GLOVE SURG UNDER POLY LF SZ7 (GLOVE) ×2 IMPLANT
GLOVE SURG UNDER POLY LF SZ8 (GLOVE) ×1
GOWN STRL REUS W/ TWL LRG LVL3 (GOWN DISPOSABLE) ×2 IMPLANT
GOWN STRL REUS W/ TWL XL LVL3 (GOWN DISPOSABLE) ×1 IMPLANT
GOWN STRL REUS W/TWL LRG LVL3 (GOWN DISPOSABLE) ×2
GOWN STRL REUS W/TWL XL LVL3 (GOWN DISPOSABLE) ×1
KIT BASIN OR (CUSTOM PROCEDURE TRAY) ×2 IMPLANT
KIT TURNOVER KIT B (KITS) ×2 IMPLANT
MANIFOLD NEPTUNE II (INSTRUMENTS) ×2 IMPLANT
NEEDLE 22X1 1/2 (OR ONLY) (NEEDLE) ×2 IMPLANT
NEEDLE HYPO 25GX1X1/2 BEV (NEEDLE) ×2 IMPLANT
NS IRRIG 1000ML POUR BTL (IV SOLUTION) ×2 IMPLANT
PACK UNIVERSAL I (CUSTOM PROCEDURE TRAY) ×2 IMPLANT
PAD ARMBOARD 7.5X6 YLW CONV (MISCELLANEOUS) ×4 IMPLANT
PATTIES SURGICAL .5 X.5 (GAUZE/BANDAGES/DRESSINGS) ×2 IMPLANT
PENCIL BUTTON HOLSTER BLD 10FT (ELECTRODE) ×2 IMPLANT
RASP 3.0MM (RASP) ×2 IMPLANT
SPONGE INTESTINAL PEANUT (DISPOSABLE) ×2 IMPLANT
SPONGE LAP 18X18 RF (DISPOSABLE) ×2 IMPLANT
SPONGE LAP 4X18 RFD (DISPOSABLE) ×2 IMPLANT
SPONGE SURGIFOAM ABS GEL 12-7 (HEMOSTASIS) ×2 IMPLANT
SPONGE SURGIFOAM ABS GEL SZ50 (HEMOSTASIS) ×2 IMPLANT
STAPLER VISISTAT 35W (STAPLE) ×2 IMPLANT
STRIP CLOSURE SKIN 1/2X4 (GAUZE/BANDAGES/DRESSINGS) ×4 IMPLANT
SUT BONE WAX W31G (SUTURE) ×2 IMPLANT
SUT MNCRL AB 4-0 PS2 18 (SUTURE) ×4 IMPLANT
SUT VIC AB 0 CT1 18XCR BRD 8 (SUTURE) ×2 IMPLANT
SUT VIC AB 0 CT1 8-18 (SUTURE) ×2
SUT VIC AB 1 CT1 18XCR BRD 8 (SUTURE) ×2 IMPLANT
SUT VIC AB 1 CT1 8-18 (SUTURE) ×2
SUT VIC AB 2-0 CT2 18 VCP726D (SUTURE) ×4 IMPLANT
SYR BULB IRRIG 60ML STRL (SYRINGE) ×2 IMPLANT
SYR CONTROL 10ML LL (SYRINGE) ×4 IMPLANT
TOWEL GREEN STERILE (TOWEL DISPOSABLE) ×4 IMPLANT
TOWEL GREEN STERILE FF (TOWEL DISPOSABLE) ×2 IMPLANT
TUBE CONNECTING 12X1/4 (SUCTIONS) ×2 IMPLANT
WATER STERILE IRR 1000ML POUR (IV SOLUTION) IMPLANT
YANKAUER SUCT BULB TIP NO VENT (SUCTIONS) ×2 IMPLANT

## 2020-09-12 NOTE — Anesthesia Postprocedure Evaluation (Signed)
Anesthesia Post Note  Patient: Sharon Logan  Procedure(s) Performed: REMOVAL OF BILATERAL PELVIC SCREWS (Bilateral Pelvis)     Patient location during evaluation: PACU Anesthesia Type: General Level of consciousness: awake and alert Pain management: pain level controlled Vital Signs Assessment: post-procedure vital signs reviewed and stable Respiratory status: spontaneous breathing, nonlabored ventilation, respiratory function stable and patient connected to nasal cannula oxygen Cardiovascular status: blood pressure returned to baseline and stable Postop Assessment: no apparent nausea or vomiting Anesthetic complications: no   No complications documented.  Last Vitals:  Vitals:   09/12/20 1015 09/12/20 1030  BP: 125/76 125/75  Pulse: 82 79  Resp: 17 15  Temp:  36.8 C  SpO2: 100% 95%    Last Pain:  Vitals:   09/12/20 1030  TempSrc:   PainSc: 0-No pain                 Johniya Durfee

## 2020-09-12 NOTE — H&P (Signed)
PREOPERATIVE H&P  Chief Complaint: Low back pain  HPI: Sharon Logan is a 65 y.o. female who presents with ongoing pain in the low back on the right and left sides, overlying the SI joints. Patient is s/p SAI instrumentation and presents to have her SAI screws removed, as she feels they are causing pain.   Patient has failed multiple forms of conservative care and continues to have pain (see office notes for additional details regarding the patient's full course of treatment)  Past Medical History:  Diagnosis Date  . Allergic rhinitis   . Anxiety   . Arthritis   . Cancer ([place])    skin  . Chronic pain   . Chronically dry eyes   . Depression   . Fibromyalgia   . GERD (gastroesophageal reflux disease)   . Headache   . Vitamin D deficiency   . Wears glasses    Past Surgical History:  Procedure Laterality Date  . ABDOMINAL HYSTERECTOMY    . BACK SURGERY    . BUNIONECTOMY Bilateral   . EYE SURGERY     laser surgery  . NASAL SINUS SURGERY     x2  . SKIN CANCER EXCISION     face x3  . TUBAL LIGATION     Social History   Socioeconomic History  . Marital status: Widowed    Spouse name: Not on file  . Number of children: Not on file  . Years of education: Not on file  . Highest education level: Not on file  Occupational History  . Not on file  Tobacco Use  . Smoking status: Never Smoker  . Smokeless tobacco: Never Used  Vaping Use  . Vaping Use: Never used  Substance and Sexual Activity  . Alcohol use: No  . Drug use: No  . Sexual activity: Not on file  Other Topics Concern  . Not on file  Social History Narrative   Right handed   Social Determinants of Health   Financial Resource Strain: Not on file  Food Insecurity: Not on file  Transportation Needs: Not on file  Physical Activity: Not on file  Stress: Not on file  Social Connections: Not on file   Family History  Problem Relation Age of Onset  . Depression Mother   . Heart attack Mother   .  COPD Mother   . Depression Father   . Heart attack Father   . ADD / ADHD Son    Allergies  Allergen Reactions  . Gabapentin Other (See Comments)  . Polymyxin B Other (See Comments)    Eyes go blood red  . Cymbalta [Duloxetine Hcl] Other (See Comments)    Headaches, constipation  . Duloxetine Other (See Comments)    HEADACHES CONSTIPATION  . Lamotrigine Rash  . Other Other (See Comments)    OTOBIOTIC > RED EYES  . Pregabalin Rash    Itchy red rash on chest  . Prozac [Fluoxetine] Other (See Comments)    CONSTIPATION  . Zolpidem Rash    "sleep walk"   Prior to Admission medications   Medication Sig Start Date End Date Taking? Authorizing Provider  b complex vitamins capsule Take 1 capsule by mouth daily.   Yes [provider]  baclofen (LIORESAL) 10 MG tablet Take 10 mg by mouth every 8 (eight) hours as needed for muscle spasms. 02/09/20  Yes [provider]  Carboxymethylcellulose Sodium (THERATEARS OP) Apply 1 drop to eye 4 (four) times daily as needed (dry  eyes).   Yes [provider]  cholecalciferol (VITAMIN D) 25 MCG (1000 UNIT) tablet Take 2,000 Units by mouth in the morning.   Yes [provider]  fluticasone (FLONASE) 50 MCG/ACT nasal spray Place 2 sprays into both nostrils 2 (two) times daily as needed for allergies or rhinitis.   Yes [provider]  HYDROcodone-acetaminophen (NORCO) 10-325 MG tablet Take 1 tablet by mouth daily. 08/26/20  Yes [provider]  levocetirizine (XYZAL) 5 MG tablet Take 5 mg by mouth at bedtime as needed for allergies.   Yes [provider]  mirtazapine (REMERON) 15 MG tablet Take 1 tablet (15 mg total) by mouth at bedtime. 09/10/20  Yes Thayer Headings, PMHNP  Nutritional Supplements (WOMENS FORMULA MENOPAUSE PO) Take 2 capsules by mouth in the morning and at bedtime.   Yes [provider]  rizatriptan (MAXALT) 10 MG tablet Take 10 mg by mouth daily as needed for migraine.   09/08/18  Yes [provider]  sulfamethoxazole-trimethoprim (BACTRIM DS) 800-160 MG tablet Take 1 tablet by mouth 2 (two) times daily. 08/24/20  Yes [provider]     All other systems have been reviewed and were otherwise negative with the exception of those mentioned in the HPI and as above.  Physical Exam: Vitals:   09/12/20 0621  BP: 113/72  Pulse: 62  Resp: 17  Temp: 97.6 F (36.4 C)  SpO2: 100%    Body mass index is 24.37 kg/m.  General: Alert, no acute distress Cardiovascular: No pedal edema Respiratory: No cyanosis, no use of accessory musculature Skin: No lesions in the area of chief complaint Neurologic: Sensation intact distally Psychiatric: Patient is competent for consent with normal mood and affect Lymphatic: No axillary or cervical lymphadenopathy   Assessment/Plan: BILATERAL SACROILIAC JOINT PAIN, POTENTIALLY RELATED TO PAINFUL HARDWARE Plan for Procedure(s): REMOVAL OF BI-LATERAL PELVIC SCREWS   Norva Karvonen, MD 09/12/2020 7:21 AM

## 2020-09-12 NOTE — Progress Notes (Signed)
Patient has abrasions on right hand, right elbow, left knee, right knee.  No drainage present.

## 2020-09-12 NOTE — Transfer of Care (Signed)
Immediate Anesthesia Transfer of Care Note  Patient: Sharon Logan  Procedure(s) Performed: REMOVAL OF BILATERAL PELVIC SCREWS (Bilateral Pelvis)  Patient Location: PACU  Anesthesia Type:General  Level of Consciousness: drowsy and patient cooperative  Airway & Oxygen Therapy: Patient Spontanous Breathing and Patient connected to face mask oxygen  Post-op Assessment: Report given to RN, Post -op Vital signs reviewed and stable and Patient moving all extremities X 4  Post vital signs: Reviewed  Last Vitals:  Vitals Value Taken Time  BP 131/74 09/12/20 1000  Temp 36.8 C 09/12/20 1000  Pulse 86 09/12/20 1001  Resp 21 09/12/20 1001  SpO2 100 % 09/12/20 1001  Vitals shown include unvalidated device data.  Last Pain:  Vitals:   09/12/20 0657  TempSrc:   PainSc: 3       Patients Stated Pain Goal: 3 (08/13/29 4388)  Complications: No complications documented.

## 2020-09-12 NOTE — Op Note (Signed)
PATIENT NAME: Sharon Logan RECORD NO.:   [id number]    DATE OF BIRTH: 29-Oct-1955   DATE OF PROCEDURE: 09/12/2020                               OPERATIVE REPORT     PREOPERATIVE DIAGNOSES: 1.  Retained painful hardware (bilateral sacral/pelvis instrumentation) 2.  Status post previous revision posterior fusion procedure   POSTOPERATIVE DIAGNOSES: 1.  Retained painful hardware (bilateral sacral/pelvis instrumentation) 2.  Status post previous revision posterior fusion procedure   PROCEDURE: 1.  Removal of bilateral pelvis screws 2.  Exploration of posterior spinal fusion, L5-S1   SURGEON:  Phylliss Bob, MD   ASSISTANT:  Pricilla Holm, PA-C.   ANESTHESIA:  General endotracheal anesthesia.   COMPLICATIONS:  None.   DISPOSITION:  Stable.   ESTIMATED BLOOD LOSS:  Minimal.   INDICATIONS FOR SURGERY:  Briefly, Sharon Logan  is a pleasant 65 -year- old female, who is status post a revision posterior fusion procedure, involving placement of bilateral "SAI" screws, extending through the sacrum and into the bilateral iliac crests.  Patient did well after surgery, but did go on to have pain over the right and left sacroiliac joint regions.  The patient did have concerns that her pain was secondary to these screws.  I did extensively discussed with the patient that this may or may not be the case, but I did feel that it was reasonable to proceed with removal of the screws, in order to identify this may help alleviate her pain.  She was interested in proceeding with this.  She did understand that if removing the screws did not address her pain, additional procedures targeting her sacroiliac joints can be offered to her in the future.  After a full understanding of the risks and potential limitations of surgery, she did wish to proceed.    OPERATIVE DETAILS:  On 09/12/2020, the patient was brought to surgery and general endotracheal anesthesia was administered.  The patient was placed  prone on a well-padded flat Jackson bed with a spinal frame.  The back was prepped and draped in the usual sterile fashion.   A timeout procedure was performed.  At this point, paramedian incisions were made on the right and left sides, overlying the patient's instrumentation construct.  Starting on the left side, I did dissect through the paraspinal musculature, and did identify the rod, as well as the L5, S1, and pelvic screws.  The L5-S1 posterior lateral recess was identified and explored, in order to evaluate the success of the L5-S1 fusion.  There did appear to be robust bone formation across the intervertebral space, spanning the transverse process of L5 and sacral ala.  This did suggest a solid fusion across L5-S1. I then used a metal cutting bur to amputate the rod distal to the S1 pedicle screw.  The cap overlying the pelvic screw on the left side was then removed, as was the distal aspect of the rod, and the screw extending from the sacrum into the pelvis.  Bone wax was placed into the hole that remained.  The wound was then copiously irrigated, and closed in layers, using #1 Vicryl, followed by 0 Vicryl, followed by 2-0 Vicryl, followed by 4-0 Monocryl.  I then turned my attention towards the right.  In the manner previously described, the paraspinal musculature was dissected, the rod on the right was exposed, and a metal cutting  bur was used to amputate the rod below the S1 pedicle screw.  As previously documented, the cap associated with the pelvic screw was removed, as was the distal aspect of the rod, and the pelvic screw, and bone wax was again placed into the hole that remained.  All bleeding was adequately controlled and the wound was copiously irrigated.  Once again, the wound was closed in layers as previously documented.  Final AP and lateral fluoroscopic images were obtained in order to confirm successful removal of the appropriate screws.   Of note, Pricilla Holm, PA-C, was my assistant  throughout surgery, and did aid in retraction, removal of the hardware, suctioning, and closure from start to finish.         Phylliss Bob, MD

## 2020-09-12 NOTE — Anesthesia Procedure Notes (Signed)
Procedure Name: Intubation Date/Time: 09/12/2020 7:44 AM Performed by: Janene Harvey, CRNA Pre-anesthesia Checklist: Patient identified, Emergency Drugs available, Suction available and Patient being monitored Patient Re-evaluated:Patient Re-evaluated prior to induction Oxygen Delivery Method: Circle system utilized Preoxygenation: Pre-oxygenation with 100% oxygen Induction Type: IV induction Ventilation: Mask ventilation without difficulty Laryngoscope Size: Glidescope and 3 Grade View: Grade I Tube type: Oral Tube size: 7.0 mm Number of attempts: 1 Airway Equipment and Method: Stylet and Oral airway Placement Confirmation: ETT inserted through vocal cords under direct vision,  positive ETCO2 and breath sounds checked- equal and bilateral Secured at: 22 cm Tube secured with: Tape Dental Injury: Teeth and Oropharynx as per pre-operative assessment  Comments: Elective glidescope. Previous anesthetic required bougie for intubation related to anterior airway/inability to pass ett. 7.0 easily passed with glidescope.

## 2020-09-12 NOTE — OR Nursing (Signed)
4x4 Mepilex dressing applied to left knee for additional protection, prior to prone positioning

## 2020-09-13 ENCOUNTER — Encounter (HOSPITAL_COMMUNITY): Payer: Self-pay | Admitting: Orthopedic Surgery

## 2020-09-13 MED FILL — Thrombin (Recombinant) For Soln 20000 Unit: CUTANEOUS | Qty: 1 | Status: AC

## 2020-09-16 DIAGNOSIS — M25311 Other instability, right shoulder: Secondary | ICD-10-CM | POA: Diagnosis not present

## 2020-09-16 DIAGNOSIS — S43431D Superior glenoid labrum lesion of right shoulder, subsequent encounter: Secondary | ICD-10-CM | POA: Diagnosis not present

## 2020-09-16 DIAGNOSIS — Z9889 Other specified postprocedural states: Secondary | ICD-10-CM | POA: Diagnosis not present

## 2020-09-16 DIAGNOSIS — M25511 Pain in right shoulder: Secondary | ICD-10-CM | POA: Diagnosis not present

## 2020-09-19 DIAGNOSIS — Z9889 Other specified postprocedural states: Secondary | ICD-10-CM | POA: Diagnosis not present

## 2020-09-19 DIAGNOSIS — S43431D Superior glenoid labrum lesion of right shoulder, subsequent encounter: Secondary | ICD-10-CM | POA: Diagnosis not present

## 2020-09-19 DIAGNOSIS — M25311 Other instability, right shoulder: Secondary | ICD-10-CM | POA: Diagnosis not present

## 2020-09-19 DIAGNOSIS — M25511 Pain in right shoulder: Secondary | ICD-10-CM | POA: Diagnosis not present

## 2020-09-23 NOTE — Progress Notes (Signed)
NEUROLOGY FOLLOW UP OFFICE NOTE  PORS[facility] AXLEY [id number]  Assessment/Plan:   1.  Chronic migraine 2.  Dizziness - peripheral vs migrainous  1.  Will send new prescription for Aimovig 70mg  every 28 days.  Advised to contact office if not heard in a week. 2.  Maxalt as needed 3.  Limit use of pain relievers to no more than 2 days out of week to prevent risk of rebound or medication-overuse headache. 4.  Keep headache diary 5.  Follow up 4 to 6 months.  Subjective:  Sharon Logan is a 65 year old right-handed female with fibromyalgia and depression who follows up for migraines and dizziness.  UPDATE: Supposed to start [place] but she never received a response from Medicare or her pharmacy that it was ready. She has had change in antidepressants which have triggered headaches.   Current NSAIDS/analgesics:  Hydrocodone-acetaminophen Current triptans:  Maxalt 10mg  Current Antidepressant medications:  Remeron 15mg  Other therapy:  none  Caffeine:  2 cups black tea daily.  [place] once a week.  No coffee. Diet:  100 oz water daily.  Does not skip meals Exercise:  yes Depression:  Generally controlled; but a little worse now. Other pain:  Fibromyalgia, back pain Sleep hygiene:  Gets a good 5 hours a night.   HISTORY: She has had migraines "all my life" but worse since menopause.  Weather always is a trigger.  Headaches have been worse.  Typical headaches are across the forehead.  They have changed over the past 3 to 4 months.  They last 2-3 hours with Maxalt and occur about 15 days a month.  They are now bilateral retro-orbital aching pain.  There is associated with nausea, photophobia, phonophobia and in the past left greater than right arm numbness.  She now has associated dizziness, described as a spinning sensation in which she needs to hold onto something, lasting a couple of minutes.  It was originally 2 times a day and then 1 to 2 times a week.  It is not positional but occurs  with activity such as playing with her dog on the floor or exercising such as yoga.  Dizziness causes problems with balance.  No double vision.  Sometimes dizziness occurs independent from the headaches.  She tried vestibular rehab.  She has fibromyalgia and chronic low back pain and bilateral lumbar radiculopathy with known spondylosis and spinal stenosis of the lumbar spine s/p L4-5 fusion and later L3-S1 fusion revision.  She has previously been followed by surgery, pain management and neurology.  She has failed Cymbalta, gabapentin, Lyrica, Nucenta, baclofen, diclofenac, Celebrex, injections and surgery.    Past NSAIDS/analgesics:  Ibuprofen, naproxen, Excedrin, Fioricet Past abortive triptans:  Relpax, sumatriptan Past abortive ergotamine:  none Past anti-emetic:  none  Past antihypertensive medications:  none Past antidepressant medications:  Nortriptyline, venlafaxine, Lexapro Past anticonvulsant medications:  Topiramate, Depakote Past anti-CGRP:  none Other past therapies:  Botox (effective for 2 months and then wears off the last month)   Family history of headache:  Mom  PAST MEDICAL HISTORY: Past Medical History:  Diagnosis Date  . Allergic rhinitis   . Anxiety   . Arthritis   . Cancer ([place])    skin  . Chronic pain   . Chronically dry eyes   . Depression   . Fibromyalgia   . GERD (gastroesophageal reflux disease)   . Headache   . Vitamin D deficiency   . Wears glasses     MEDICATIONS:  Current Outpatient Medications on File Prior to Visit  Medication Sig Dispense Refill  . b complex vitamins capsule Take 1 capsule by mouth daily.    . baclofen (LIORESAL) 10 MG tablet Take 10 mg by mouth every 8 (eight) hours as needed for muscle spasms.    . Carboxymethylcellulose Sodium (THERATEARS OP) Apply 1 drop to eye 4 (four) times daily as needed (dry eyes).    . cholecalciferol (VITAMIN D) 25 MCG (1000 UNIT) tablet Take 2,000 Units by mouth in the morning.    .  fluticasone (FLONASE) 50 MCG/ACT nasal spray Place 2 sprays into both nostrils 2 (two) times daily as needed for allergies or rhinitis.    Marland Kitchen levocetirizine (XYZAL) 5 MG tablet Take 5 mg by mouth at bedtime as needed for allergies.    . mirtazapine (REMERON) 15 MG tablet Take 1 tablet (15 mg total) by mouth at bedtime. 90 tablet 0  . Nutritional Supplements (WOMENS FORMULA MENOPAUSE PO) Take 2 capsules by mouth in the morning and at bedtime.    . rizatriptan (MAXALT) 10 MG tablet Take 10 mg by mouth daily as needed for migraine.      No current facility-administered medications on file prior to visit.    ALLERGIES: Allergies  Allergen Reactions  . Gabapentin Other (See Comments)  . Polymyxin B Other (See Comments)    Eyes go blood red  . Cymbalta [Duloxetine Hcl] Other (See Comments)    Headaches, constipation  . Duloxetine Other (See Comments)    HEADACHES CONSTIPATION  . Lamotrigine Rash  . Other Other (See Comments)    OTOBIOTIC > RED EYES  . Pregabalin Rash    Itchy red rash on chest  . Prozac [Fluoxetine] Other (See Comments)    CONSTIPATION  . Zolpidem Rash    "sleep walk"    FAMILY HISTORY: Family History  Problem Relation Age of Onset  . Depression Mother   . Heart attack Mother   . COPD Mother   . Depression Father   . Heart attack Father   . ADD / ADHD Son       Objective:  Blood pressure 118/71, pulse 72, resp. rate 20, height 5\' 4"  (1.626 m), weight 143 lb (64.9 kg), SpO2 96 %. General: No acute distress.  Patient appears well-groomed.    Metta Clines, DO  CC: Marda Stalker, PA-C

## 2020-09-24 ENCOUNTER — Ambulatory Visit (INDEPENDENT_AMBULATORY_CARE_PROVIDER_SITE_OTHER): Payer: PPO | Admitting: Neurology

## 2020-09-24 ENCOUNTER — Encounter: Payer: Self-pay | Admitting: Neurology

## 2020-09-24 ENCOUNTER — Other Ambulatory Visit: Payer: Self-pay

## 2020-09-24 VITALS — BP 118/71 | HR 72 | Resp 20 | Ht 64.0 in | Wt 143.0 lb

## 2020-09-24 DIAGNOSIS — M25511 Pain in right shoulder: Secondary | ICD-10-CM | POA: Diagnosis not present

## 2020-09-24 DIAGNOSIS — G43709 Chronic migraine without aura, not intractable, without status migrainosus: Secondary | ICD-10-CM | POA: Diagnosis not present

## 2020-09-24 DIAGNOSIS — S43431D Superior glenoid labrum lesion of right shoulder, subsequent encounter: Secondary | ICD-10-CM | POA: Diagnosis not present

## 2020-09-24 DIAGNOSIS — Z9889 Other specified postprocedural states: Secondary | ICD-10-CM | POA: Diagnosis not present

## 2020-09-24 DIAGNOSIS — R42 Dizziness and giddiness: Secondary | ICD-10-CM

## 2020-09-24 DIAGNOSIS — M25311 Other instability, right shoulder: Secondary | ICD-10-CM | POA: Diagnosis not present

## 2020-09-24 MED ORDER — AIMOVIG 70 MG/ML ~~LOC~~ SOAJ: 70.0000 mg | SUBCUTANEOUS | 5 refills | Status: DC

## 2020-09-24 NOTE — Patient Instructions (Signed)
1.  Start Aimovig 70mg  injection every 28 days.  Contact us in a week if you do not hear from anyone 2.  Rizatriptan as needed.  Limit use of pain relievers to no more than 2 days out of week to prevent risk of rebound or medication-overuse headache. 3.  Try magnesium citrate 400mg  daily, riboflavin (B2) 400mg  daily and coenzyme Q-10 300mg  daily 4.  Keep headache diary 5.  Follow up 6 months.

## 2020-09-25 ENCOUNTER — Encounter: Payer: Self-pay | Admitting: Neurology

## 2020-09-25 DIAGNOSIS — Z9889 Other specified postprocedural states: Secondary | ICD-10-CM | POA: Diagnosis not present

## 2020-09-25 NOTE — Progress Notes (Signed)
Sharon Logan (Key: [id number]) Rx #: [id number] Aimovig 70MG /ML auto-injectors   Form [employer] Electronic PA Form [phone] NCPDP) Created 18 hours ago Sent to Plan 3 minutes ago Plan Response 3 minutes ago Submit Clinical Questions less than a minute ago Determination Favorable less than a minute ago Message from [place] PA request has been approved. Additional information will be provided in the approval communication. (Message 1145)

## 2020-09-30 DIAGNOSIS — M48061 Spinal stenosis, lumbar region without neurogenic claudication: Secondary | ICD-10-CM | POA: Diagnosis not present

## 2020-09-30 DIAGNOSIS — S73191A Other sprain of right hip, initial encounter: Secondary | ICD-10-CM | POA: Diagnosis not present

## 2020-09-30 DIAGNOSIS — M25511 Pain in right shoulder: Secondary | ICD-10-CM | POA: Diagnosis not present

## 2020-09-30 DIAGNOSIS — M25311 Other instability, right shoulder: Secondary | ICD-10-CM | POA: Diagnosis not present

## 2020-09-30 DIAGNOSIS — M797 Fibromyalgia: Secondary | ICD-10-CM | POA: Diagnosis not present

## 2020-09-30 DIAGNOSIS — Z79899 Other long term (current) drug therapy: Secondary | ICD-10-CM | POA: Diagnosis not present

## 2020-09-30 DIAGNOSIS — S43431D Superior glenoid labrum lesion of right shoulder, subsequent encounter: Secondary | ICD-10-CM | POA: Diagnosis not present

## 2020-09-30 DIAGNOSIS — Z9889 Other specified postprocedural states: Secondary | ICD-10-CM | POA: Diagnosis not present

## 2020-09-30 DIAGNOSIS — M85851 Other specified disorders of bone density and structure, right thigh: Secondary | ICD-10-CM | POA: Diagnosis not present

## 2020-10-01 DIAGNOSIS — M2560 Stiffness of unspecified joint, not elsewhere classified: Secondary | ICD-10-CM | POA: Diagnosis not present

## 2020-10-01 DIAGNOSIS — M6281 Muscle weakness (generalized): Secondary | ICD-10-CM | POA: Diagnosis not present

## 2020-10-01 DIAGNOSIS — M5416 Radiculopathy, lumbar region: Secondary | ICD-10-CM | POA: Diagnosis not present

## 2020-10-01 DIAGNOSIS — M5451 Vertebrogenic low back pain: Secondary | ICD-10-CM | POA: Diagnosis not present

## 2020-10-03 ENCOUNTER — Ambulatory Visit (INDEPENDENT_AMBULATORY_CARE_PROVIDER_SITE_OTHER): Payer: PPO | Admitting: Podiatry

## 2020-10-03 ENCOUNTER — Other Ambulatory Visit: Payer: Self-pay

## 2020-10-03 DIAGNOSIS — M6281 Muscle weakness (generalized): Secondary | ICD-10-CM | POA: Diagnosis not present

## 2020-10-03 DIAGNOSIS — M2560 Stiffness of unspecified joint, not elsewhere classified: Secondary | ICD-10-CM | POA: Diagnosis not present

## 2020-10-03 DIAGNOSIS — M79671 Pain in right foot: Secondary | ICD-10-CM | POA: Diagnosis not present

## 2020-10-03 DIAGNOSIS — M21961 Unspecified acquired deformity of right lower leg: Secondary | ICD-10-CM | POA: Diagnosis not present

## 2020-10-03 DIAGNOSIS — M5451 Vertebrogenic low back pain: Secondary | ICD-10-CM | POA: Diagnosis not present

## 2020-10-03 DIAGNOSIS — M779 Enthesopathy, unspecified: Secondary | ICD-10-CM

## 2020-10-03 DIAGNOSIS — M5416 Radiculopathy, lumbar region: Secondary | ICD-10-CM | POA: Diagnosis not present

## 2020-10-07 ENCOUNTER — Telehealth: Payer: Self-pay | Admitting: Neurology

## 2020-10-07 DIAGNOSIS — M6281 Muscle weakness (generalized): Secondary | ICD-10-CM | POA: Diagnosis not present

## 2020-10-07 DIAGNOSIS — M2560 Stiffness of unspecified joint, not elsewhere classified: Secondary | ICD-10-CM | POA: Diagnosis not present

## 2020-10-07 DIAGNOSIS — M5416 Radiculopathy, lumbar region: Secondary | ICD-10-CM | POA: Diagnosis not present

## 2020-10-07 DIAGNOSIS — M5451 Vertebrogenic low back pain: Secondary | ICD-10-CM | POA: Diagnosis not present

## 2020-10-07 NOTE — Telephone Encounter (Signed)
Patient stated the Prior Authorization is approved by [facility]  1. Which medications need to be refilled? (please list name of each medication and dose if known) Aimovig  2. Which pharmacy/location (including street and city if local pharmacy) is medication to be sent to? [facility] on [address]  3. Do they need a 30 day or 90 day supply? "However it comes"

## 2020-10-08 ENCOUNTER — Other Ambulatory Visit: Payer: Self-pay | Admitting: Podiatry

## 2020-10-08 ENCOUNTER — Encounter: Payer: PPO | Attending: Physical Medicine and Rehabilitation | Admitting: Physical Medicine and Rehabilitation

## 2020-10-08 ENCOUNTER — Telehealth: Payer: Self-pay | Admitting: *Deleted

## 2020-10-08 ENCOUNTER — Other Ambulatory Visit: Payer: Self-pay

## 2020-10-08 VITALS — BP 136/86 | HR 65 | Temp 98.1°F | Ht 64.0 in | Wt 141.0 lb

## 2020-10-08 DIAGNOSIS — F5101 Primary insomnia: Secondary | ICD-10-CM | POA: Diagnosis not present

## 2020-10-08 DIAGNOSIS — M797 Fibromyalgia: Secondary | ICD-10-CM | POA: Diagnosis not present

## 2020-10-08 MED ORDER — TOPIRAMATE 25 MG PO TABS: 25.0000 mg | ORAL_TABLET | Freq: Every day | ORAL | 2 refills | Status: DC

## 2020-10-08 MED ORDER — NITROGLYCERIN 0.2 MG/HR TD PT24: 0.2000 mg | MEDICATED_PATCH | Freq: Every day | TRANSDERMAL | 2 refills | Status: DC

## 2020-10-08 NOTE — Telephone Encounter (Signed)
Advised pt that the medication sent to the pharmacy on 09/24/20.

## 2020-10-08 NOTE — Patient Instructions (Addendum)
1) Ginger (especially studied for arthritis)- reduce leukotriene production to decrease inflammation 2) Blueberries- high in phytonutrients that decrease inflammation 3) Salmon- marine omega-3s reduce joint swelling and pain 4) Pumpkin seeds- reduce inflammation 5) dark chocolate- reduces inflammation 6) turmeric- reduces inflammation 7) tart cherries - reduce pain and stiffness 8) extra virgin olive oil - its compound olecanthal helps to block prostaglandins  9) chili peppers- can be eaten or applied topically via capsaicin 10) mint- helpful for headache, muscle aches, joint pain, and itching 11) garlic- reduces inflammation  Link to further information on diet for chronic pain: [email or web address]   Anxiety: -Discussed exercise and meditation as tools to decrease anxiety. -Recommended Down Dog Yoga app -Discussed spending time outdoors. -Discussed positive re-framing of anxiety.  -Discussed the following foods that have been show to reduce anxiety: 1) [place] nuts, mushrooms, soy beans due to their high selenium content. Upper limit of toxicity of selenium is 471mcg/day so no more than 3-4 [place] nuts per day.  2) Fatty fish such as salmon, mackerel, sardines, trout, and herring- high in omega-3 fatty acids 3) Eggs- increases serotonin and dopamine 4) Pumpkin seeds- high in omega-3 fatty acids 5) dark chocolate- high in flavanols that increase blood flow to brain 6) turmeric- take with black pepper to increase absorption 7) chamomile tea- antioxidant and anti-inflammatory properties 8) yogurt without sugar- supports gut-brain axis 9) green tea- contains L- theanine 10) blueberries- high in vitamin C and antioxidants 11) [place]- high in tryptophan which gets converted to serotonin 12) bell peppers- rich in vitamin C and antioxidants 13) citrus fruits- rich in vitamin C and antioxidants 14) almonds- high in  vitamin E and healthy fats 15) chia seeds- high in omega-3 fatty acids

## 2020-10-08 NOTE — Progress Notes (Signed)
Subjective:    Patient ID: Sharon Logan, female    DOB: Sep 20, 1955, 65 y.o.   MRN: [id number]  HPI  Sharon Logan is a 65 year old woman who presents to establish care for fibromyalgia.   -Muscles feel like dead weight- both arms and legs.   -going on for 4 years.  -found her husband's body- her husband had throat cancer and was doing radiation and chemo at the same time and suffered a heart attack. This was 14 years ago and may have been a trigger.  -she worked at a [employer] and had to do a lot of lifting.   -she has been given mirtazepine as she also has anxiety   -she has been given hydrocodone and she feels she has built up a tolerance  -she has never tried Topamax.   -she gets cold really fast and hurts twice as bad.   Pain Inventory Average Pain 5 Pain Right Now 4 My pain is sharp, burning, stabbing, tingling and aching  In the last 24 hours, has pain interfered with the following? General activity 5 Relation with others 5 Enjoyment of life 5 What TIME of day is your pain at its worst? evening Sleep (in general) Poor  Pain is worse with: walking, bending and some activites Pain improves with: rest, heat/ice, therapy/exercise and injections Relief from Meds: 4  walk without assistance how many minutes can you walk? 40 ability to climb steps?  yes do you drive?  yes  disabled: date disabled Jan 2013 retired  numbness depression anxiety  New pt  New pt    Family History  Problem Relation Age of Onset  . Depression Mother   . Heart attack Mother   . COPD Mother   . Depression Father   . Heart attack Father   . ADD / ADHD Son    Social History   Socioeconomic History  . Marital status: Widowed    Spouse name: Not on file  . Number of children: Not on file  . Years of education: Not on file  . Highest education level: Not on file  Occupational History  . Not on file  Tobacco Use  . Smoking status: Never Smoker  . Smokeless tobacco:  Never Used  Vaping Use  . Vaping Use: Never used  Substance and Sexual Activity  . Alcohol use: No  . Drug use: No  . Sexual activity: Not on file  Other Topics Concern  . Not on file  Social History Narrative   Right handed   Lives in a one story home   Drinks Tea   Social Determinants of Health   Financial Resource Strain: Not on file  Food Insecurity: Not on file  Transportation Needs: Not on file  Physical Activity: Not on file  Stress: Not on file  Social Connections: Not on file   Past Surgical History:  Procedure Laterality Date  . ABDOMINAL HYSTERECTOMY    . BACK SURGERY    . BUNIONECTOMY Bilateral   . EYE SURGERY     laser surgery  . NASAL SINUS SURGERY     x2  . SACROILIAC JOINT FUSION Bilateral 09/12/2020   Procedure: REMOVAL OF BILATERAL PELVIC SCREWS;  Surgeon: Phylliss Bob, MD;  Location: [facility];  Service: Orthopedics;  Laterality: Bilateral;  . SKIN CANCER EXCISION     face x3  . TUBAL LIGATION     Past Medical History:  Diagnosis Date  . Allergic rhinitis   . Anxiety   .  Arthritis   . Cancer ([place])    skin  . Chronic pain   . Chronically dry eyes   . Depression   . Fibromyalgia   . GERD (gastroesophageal reflux disease)   . Headache   . Vitamin D deficiency   . Wears glasses    BP 136/86   Pulse 65   Temp 98.1 F (36.7 C)   Ht 5\' 4"  (1.626 m)   Wt 141 lb (64 kg)   SpO2 98%   BMI 24.20 kg/m   Opioid Risk Score:   Fall Risk Score:  `1  Depression screen PHQ 2/9  Depression screen PHQ 2/9 10/08/2020  Decreased Interest 1  Down, Depressed, Hopeless 1  PHQ - 2 Score 2  Altered sleeping 2  Tired, decreased energy 2  Change in appetite 2  Feeling bad or failure about yourself  0  Trouble concentrating 0  Moving slowly or fidgety/restless 0  Suicidal thoughts 0  PHQ-9 Score 8  Difficult doing work/chores Not difficult at all    Review of Systems  Musculoskeletal: Positive for myalgias.       Arm, leg, foot pain   Neurological: Positive for numbness.  Psychiatric/Behavioral: Positive for dysphoric mood. The patient is nervous/anxious.   All other systems reviewed and are negative.      Objective:   Physical Exam  Gen: no distress, normal appearing HEENT: oral mucosa pink and moist, NCAT Cardio: Reg rate Chest: normal effort, normal rate of breathing Abd: soft, non-distended Ext: no edema Psych: pleasant, normal affect Skin: intact Neuro: Alert and oriented x3 Musculoskeletal: Diffuse pain and tenderness in upper and lower extremities.      Assessment & Plan:  Sharon Logan is a 65 year old woman who presents to establish care for fibromyalgia:  Chronic Pain Syndrome secondary to fibromyalgia -Discussed current symptoms of pain and history of pain.  -Discussed benefits of exercise in reducing pain. -start topamax 25mg  HS, can uptitrate in 3 days if well tolerated.  -Discussed following foods that may reduce pain: 1) Ginger (especially studied for arthritis)- reduce leukotriene production to decrease inflammation 2) Blueberries- high in phytonutrients that decrease inflammation 3) Salmon- marine omega-3s reduce joint swelling and pain 4) Pumpkin seeds- reduce inflammation 5) dark chocolate- reduces inflammation 6) turmeric- reduces inflammation 7) tart cherries - reduce pain and stiffness 8) extra virgin olive oil - its compound olecanthal helps to block prostaglandins  9) chili peppers- can be eaten or applied topically via capsaicin 10) mint- helpful for headache, muscle aches, joint pain, and itching 11) garlic- reduces inflammation  Link to further information on diet for chronic pain: [email or web address]  2. Insomnia: -Try to go outside near sunrise -Get exercise during the day.  -Discussed good sleep hygiene: turning off all devices an hour before bedtime.  -Chamomile tea with dinner.  -Can consider over  the counter melatonin  2) Anxiety: -Discussed exercise and meditation as tools to decrease anxiety. -Recommended Down Dog Yoga app -Discussed spending time outdoors. -Discussed positive re-framing of anxiety.  -Discussed the following foods that have been show to reduce anxiety: 1) [place] nuts, mushrooms, soy beans due to their high selenium content. Upper limit of toxicity of selenium is 469mcg/day so no more than 3-4 [place] nuts per day.  2) Fatty fish such as salmon, mackerel, sardines, trout, and herring- high in omega-3 fatty acids 3) Eggs- increases serotonin and dopamine 4) Pumpkin seeds- high in omega-3 fatty acids 5) dark chocolate- high in flavanols that increase blood flow  to brain 6) turmeric- take with black pepper to increase absorption 7) chamomile tea- antioxidant and anti-inflammatory properties 8) yogurt without sugar- supports gut-brain axis 9) green tea- contains L- theanine 10) blueberries- high in vitamin C and antioxidants 11) [place]- high in tryptophan which gets converted to serotonin 12) bell peppers- rich in vitamin C and antioxidants 13) citrus fruits- rich in vitamin C and antioxidants 14) almonds- high in vitamin E and healthy fats 15) chia seeds- high in omega-3 fatty acids

## 2020-10-08 NOTE — Telephone Encounter (Signed)
Patient is calling for the status of her Nitroglycerin 2% ointment that doctor was suppose to refill, see in epic and called the pharmacy to verify but they do not have the order. Please advise.

## 2020-10-08 NOTE — Telephone Encounter (Signed)
Resent, please let her know. Thanks.

## 2020-10-09 NOTE — Progress Notes (Signed)
Subjective: 65 year old female presents the office today for follow evaluation of right foot pain.  She states that the injection did well and just recently wore off.  She recently has started using a nitro cream-was given for another issue by her dermatologist which has been helpful for her foot.  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  In 1985 is when she first started noticed her foot hurting more in her first foot doctor appointment was in 1989.  Her first surgery was in 1998.  She did get out of the service in 1975 but her feet were hurting at that point she states.  Denies any systemic complaints such as fevers, chills, nausea, vomiting. No acute changes since last appointment, and no other complaints at this time.   Objective: AAO x3, NAD DP/PT pulses palpable bilaterally, CRT less than 3 seconds There is mild tenderness palpation along the right foot along the second MPJ.  There is no edema, erythema.  There is decreased range of motion of first MPJ but no pain with range of motion.  There is no areas of pinpoint tenderness.  MMT 5/5. No pain with calf compression, swelling, warmth, erythema  Assessment: Right foot pain, capsulitis  Plan: -All treatment options discussed with the patient including all alternatives, risks, complications.  -Today we held off on steroid injection.  Prescribed Nitropatch as the cream has been helpful.  Discussed side effects.  Continue shoes and orthotics.  She said the orthotics have been comfortable.  Again discussed with her surgical intervention including second metatarsal osteotomy.  She will consider this in the future potentially. -Patient encouraged to call the office with any questions, concerns, change in symptoms.   Trula Slade DPM

## 2020-10-09 NOTE — Telephone Encounter (Signed)
Called and informed patient thru Vmessage that medication had been sent.

## 2020-10-10 DIAGNOSIS — M2560 Stiffness of unspecified joint, not elsewhere classified: Secondary | ICD-10-CM | POA: Diagnosis not present

## 2020-10-10 DIAGNOSIS — M6281 Muscle weakness (generalized): Secondary | ICD-10-CM | POA: Diagnosis not present

## 2020-10-10 DIAGNOSIS — M5451 Vertebrogenic low back pain: Secondary | ICD-10-CM | POA: Diagnosis not present

## 2020-10-10 DIAGNOSIS — M5416 Radiculopathy, lumbar region: Secondary | ICD-10-CM | POA: Diagnosis not present

## 2020-10-10 DIAGNOSIS — S29012A Strain of muscle and tendon of back wall of thorax, initial encounter: Secondary | ICD-10-CM | POA: Diagnosis not present

## 2020-10-10 NOTE — Discharge Summary (Signed)
Patient ID: Sharon Logan MRN: [id number] DOB/AGE: Jul 03, 1955 65 y.o.  Admit date: 09/12/2020 Discharge date: 09/12/2020  Admission Diagnoses:  Active Problems:   S/P hardware removal   Discharge Diagnoses:  Same  Past Medical History:  Diagnosis Date  . Allergic rhinitis   . Anxiety   . Arthritis   . Cancer ([place])    skin  . Chronic pain   . Chronically dry eyes   . Depression   . Fibromyalgia   . GERD (gastroesophageal reflux disease)   . Headache   . Vitamin D deficiency   . Wears glasses     Surgeries: Procedure(s): REMOVAL OF BILATERAL PELVIC SCREWS on 09/12/2020   Consultants: None  Discharged Condition: Improved  Hospital Course: Sharon Logan is an 65 y.o. female who was admitted 09/12/2020 for operative treatment of painful hardware. Patient has severe unremitting pain that affects sleep, daily activities, and work/hobbies. After pre-op clearance the patient was taken to the operating room on 09/12/2020 and underwent  Procedure(s): REMOVAL OF BILATERAL PELVIC SCREWS.    Patient was given perioperative antibiotics:  Anti-infectives (From admission, onward)   Start     Dose/Rate Route Frequency Ordered Stop   09/12/20 0645  ceFAZolin (ANCEF) IVPB 2g/100 mL premix        2 g 200 mL/hr over 30 Minutes Intravenous On call to O.R. 09/12/20 2831 09/12/20 0805       Patient was given sequential compression devices, early ambulation to prevent DVT.  Patient benefited maximally from hospital stay and there were no complications.    Recent vital signs: BP 125/75   Pulse 79   Temp 98.3 F (36.8 C)   Resp 15   Ht 5\' 4"  (1.626 m)   Wt 64.4 kg   SpO2 95%   BMI 24.37 kg/m    Discharge Medications:   Allergies as of 09/12/2020      Reactions   Gabapentin Other (See Comments)   Polymyxin B Other (See Comments)   Eyes go blood red   Cymbalta [duloxetine Hcl] Other (See Comments)   Headaches, constipation   Duloxetine Other (See Comments)    HEADACHES CONSTIPATION   Lamotrigine Rash   Other Other (See Comments)   OTOBIOTIC > RED EYES   Pregabalin Rash   Itchy red rash on chest   Prozac [fluoxetine] Other (See Comments)   CONSTIPATION   Zolpidem Rash   "sleep walk"      Medication List    STOP taking these medications   HYDROcodone-acetaminophen 10-325 MG tablet Commonly known as: NORCO   sulfamethoxazole-trimethoprim 800-160 MG tablet Commonly known as: BACTRIM DS     TAKE these medications   b complex vitamins capsule Take 1 capsule by mouth daily.   baclofen 10 MG tablet Commonly known as: LIORESAL Take 10 mg by mouth every 8 (eight) hours as needed for muscle spasms.   cholecalciferol 25 MCG (1000 UNIT) tablet Commonly known as: VITAMIN D Take 2,000 Units by mouth in the morning.   fluticasone 50 MCG/ACT nasal spray Commonly known as: FLONASE Place 2 sprays into both nostrils 2 (two) times daily as needed for allergies or rhinitis.   levocetirizine 5 MG tablet Commonly known as: XYZAL Take 5 mg by mouth at bedtime as needed for allergies.   mirtazapine 15 MG tablet Commonly known as: REMERON Take 1 tablet (15 mg total) by mouth at bedtime.   rizatriptan 10 MG tablet Commonly known as: MAXALT Take 10 mg by mouth daily  as needed for migraine.   THERATEARS OP Apply 1 drop to eye 4 (four) times daily as needed (dry eyes).   WOMENS FORMULA MENOPAUSE PO Take 2 capsules by mouth in the morning and at bedtime.     ASK your doctor about these medications   HYDROcodone-acetaminophen 10-325 MG tablet Commonly known as: Norco Take 1 tablet by mouth every 6 (six) hours as needed for up to 7 days for moderate pain or severe pain. Ask about: Should I take this medication?       Diagnostic Studies: DG Si Joints  Result Date: 09/12/2020 CLINICAL DATA:  SI screw removal. EXAM: DG C-ARM 1-60 MIN; BILATERAL SACROILIAC JOINTS - 3+ VIEW FLUOROSCOPY TIME:  Fluoroscopy Time:  10.7 seconds. Number of  Acquired Spot Images: 2 COMPARISON:  CT July 25, 2020. FINDINGS: Two C-arm fluoroscopic images were obtained intraoperatively and submitted for post operative interpretation. These images demonstrate lumbosacral fusion with interval removal of the sacroiliac screws bilaterally. Please see the performing provider's procedural report for further detail. IMPRESSION: Intraoperative fluoroscopy, as detailed above. Electronically Signed   By: Margaretha Sheffield MD   On: 09/12/2020 10:58   DG C-Arm 1-60 Min  Result Date: 09/12/2020 CLINICAL DATA:  SI screw removal. EXAM: DG C-ARM 1-60 MIN; BILATERAL SACROILIAC JOINTS - 3+ VIEW FLUOROSCOPY TIME:  Fluoroscopy Time:  10.7 seconds. Number of Acquired Spot Images: 2 COMPARISON:  CT July 25, 2020. FINDINGS: Two C-arm fluoroscopic images were obtained intraoperatively and submitted for post operative interpretation. These images demonstrate lumbosacral fusion with interval removal of the sacroiliac screws bilaterally. Please see the performing provider's procedural report for further detail. IMPRESSION: Intraoperative fluoroscopy, as detailed above. Electronically Signed   By: Margaretha Sheffield MD   On: 09/12/2020 10:58    Disposition: Discharge disposition: 01-Home or Self Care       Discharge Instructions    Discharge patient   Complete by: As directed    D/C Instructions printed and placed in paper chart D/C scripts sent to pharmacy electronically Back precautions at all times F/U in office 2 weeks   Discharge disposition: 01-Home or Self Care   Discharge patient date: 09/12/2020      -Scripts for pain sent to pharmacy electronically  -D/C instructions sheet printed and in chart -D/C today  -F/U in office 2 weeks   Signed: Lennie Muckle Makenzey Nanni 10/10/2020, 1:02 PM

## 2020-10-12 DIAGNOSIS — G4700 Insomnia, unspecified: Secondary | ICD-10-CM | POA: Diagnosis not present

## 2020-10-12 DIAGNOSIS — D508 Other iron deficiency anemias: Secondary | ICD-10-CM | POA: Diagnosis not present

## 2020-10-12 DIAGNOSIS — M19079 Primary osteoarthritis, unspecified ankle and foot: Secondary | ICD-10-CM | POA: Diagnosis not present

## 2020-10-12 DIAGNOSIS — G43009 Migraine without aura, not intractable, without status migrainosus: Secondary | ICD-10-CM | POA: Diagnosis not present

## 2020-10-12 DIAGNOSIS — E7800 Pure hypercholesterolemia, unspecified: Secondary | ICD-10-CM | POA: Diagnosis not present

## 2020-10-12 DIAGNOSIS — M8580 Other specified disorders of bone density and structure, unspecified site: Secondary | ICD-10-CM | POA: Diagnosis not present

## 2020-10-12 DIAGNOSIS — F3341 Major depressive disorder, recurrent, in partial remission: Secondary | ICD-10-CM | POA: Diagnosis not present

## 2020-10-14 DIAGNOSIS — M6281 Muscle weakness (generalized): Secondary | ICD-10-CM | POA: Diagnosis not present

## 2020-10-14 DIAGNOSIS — M5416 Radiculopathy, lumbar region: Secondary | ICD-10-CM | POA: Diagnosis not present

## 2020-10-14 DIAGNOSIS — M2560 Stiffness of unspecified joint, not elsewhere classified: Secondary | ICD-10-CM | POA: Diagnosis not present

## 2020-10-14 DIAGNOSIS — M5451 Vertebrogenic low back pain: Secondary | ICD-10-CM | POA: Diagnosis not present

## 2020-10-17 DIAGNOSIS — M5451 Vertebrogenic low back pain: Secondary | ICD-10-CM | POA: Diagnosis not present

## 2020-10-17 DIAGNOSIS — M5416 Radiculopathy, lumbar region: Secondary | ICD-10-CM | POA: Diagnosis not present

## 2020-10-17 DIAGNOSIS — M6281 Muscle weakness (generalized): Secondary | ICD-10-CM | POA: Diagnosis not present

## 2020-10-17 DIAGNOSIS — M2560 Stiffness of unspecified joint, not elsewhere classified: Secondary | ICD-10-CM | POA: Diagnosis not present

## 2020-10-18 DIAGNOSIS — M549 Dorsalgia, unspecified: Secondary | ICD-10-CM | POA: Diagnosis not present

## 2020-10-21 ENCOUNTER — Telehealth: Payer: Self-pay | Admitting: Podiatry

## 2020-10-21 DIAGNOSIS — M5451 Vertebrogenic low back pain: Secondary | ICD-10-CM | POA: Diagnosis not present

## 2020-10-21 DIAGNOSIS — M5416 Radiculopathy, lumbar region: Secondary | ICD-10-CM | POA: Diagnosis not present

## 2020-10-21 DIAGNOSIS — M2560 Stiffness of unspecified joint, not elsewhere classified: Secondary | ICD-10-CM | POA: Diagnosis not present

## 2020-10-21 DIAGNOSIS — M6281 Muscle weakness (generalized): Secondary | ICD-10-CM | POA: Diagnosis not present

## 2020-10-21 NOTE — Telephone Encounter (Signed)
OK. Do I need to resend any medication?

## 2020-10-21 NOTE — Telephone Encounter (Signed)
No refills are prescriptions are needed at the moment, patient just wanted you to be aware

## 2020-10-21 NOTE — Telephone Encounter (Signed)
Patient called stating that moving forward she would like to be prescribed generic brands for medications only. Last prescription the insurance wouldn't cover so she paid out of pocket, Please Advise

## 2020-10-24 DIAGNOSIS — M5451 Vertebrogenic low back pain: Secondary | ICD-10-CM | POA: Diagnosis not present

## 2020-10-24 DIAGNOSIS — M5416 Radiculopathy, lumbar region: Secondary | ICD-10-CM | POA: Diagnosis not present

## 2020-10-24 DIAGNOSIS — M2560 Stiffness of unspecified joint, not elsewhere classified: Secondary | ICD-10-CM | POA: Diagnosis not present

## 2020-10-24 DIAGNOSIS — M6281 Muscle weakness (generalized): Secondary | ICD-10-CM | POA: Diagnosis not present

## 2020-10-25 ENCOUNTER — Ambulatory Visit (INDEPENDENT_AMBULATORY_CARE_PROVIDER_SITE_OTHER): Payer: PPO | Admitting: Psychiatry

## 2020-10-25 ENCOUNTER — Other Ambulatory Visit: Payer: Self-pay

## 2020-10-25 ENCOUNTER — Encounter: Payer: Self-pay | Admitting: Psychiatry

## 2020-10-25 DIAGNOSIS — F4310 Post-traumatic stress disorder, unspecified: Secondary | ICD-10-CM | POA: Diagnosis not present

## 2020-10-25 DIAGNOSIS — F331 Major depressive disorder, recurrent, moderate: Secondary | ICD-10-CM

## 2020-10-25 MED ORDER — DESVENLAFAXINE SUCCINATE ER 25 MG PO TB24: 25.0000 mg | ORAL_TABLET | Freq: Every morning | ORAL | 2 refills | Status: DC

## 2020-10-25 NOTE — Progress Notes (Signed)
Sharon Logan [id number] [birth year] 65 y.o.  Subjective:   Patient ID:  Sharon Logan is a 65 y.o. (DOB [birth year]) female.  Chief Complaint:  Chief Complaint  Patient presents with  . Follow-up    History of depression, anxiety, and insomnia    HPI Sharon Logan presents to the office today for follow-up of anxiety, depression, and insomnia. She reports that medication helped with anxiety. She reports that she is taking Remeron prn only due to concerns about weight gain. She reports that her pain interrupts sleep. She reports that her anxiety is currently manageable. She reports that her depression is "pretty stable."   Went on a trip in March. She reports that this triggered some past trauma.   Past Medication Trials: Lexapro- "My anti-depressant of choice" and gives her energy. Was taking 2-3 years prior to surgery. Re-started one week ago. Has taken 10 mg and 20 mg doses in the past. Thinks it may have helped with pain in the past.  Cymbalta- Started after back surgery. Has made her tired, even when reduced to 30 mg. Was having to take 2 hour naps. May have helped slightly with pain. Had HA, Constipation, Nausea Savella- muscle aches, rhabdomyolysis Effexor-HA, helped with excessive sweating. Prozac- Constipation. Causes her to feel cold. Celexa Lexapro Zoloft- worsening mood Paxil Wellbutrin- Did not cause HA's. Tolerated well and was effective for depression.  Amitriptyline- Did not cause HA's. Weight gain. Effective. Nortriptyline- Did not cause HA's.Effective. Lamictal- Rash Gabapentin -Rhabomyolysis Lyrica- Adverse reaction Buspar Trazodone Ambien- Parasomnia Lunesta- Tolerated Clonidine  PHQ2-9   Flowsheet Row Office Visit from 10/08/2020 in [facility]  PHQ-2 Total Score 2  PHQ-9 Total Score 8    Flowsheet Row Admission (Discharged) from 09/12/2020 in [facility] from 09/10/2020 in  [facility] PREADMISSION TESTING  C-SSRS RISK CATEGORY No Risk No Risk       Review of Systems:  Review of Systems  Musculoskeletal: Positive for arthralgias and back pain. Negative for gait problem.       Foot pain  Neurological: Positive for headaches.  Psychiatric/Behavioral:       Please refer to HPI    Medications: I have reviewed the patient's current medications.  Current Outpatient Medications  Medication Sig Dispense Refill  . Desvenlafaxine Succinate ER 25 MG TB24 Take 25 mg by mouth every morning. 30 tablet 2  . b complex vitamins capsule Take 1 capsule by mouth daily.    . baclofen (LIORESAL) 10 MG tablet Take 10 mg by mouth every 8 (eight) hours as needed for muscle spasms.    . Carboxymethylcellulose Sodium (THERATEARS OP) Apply 1 drop to eye 4 (four) times daily as needed (dry eyes).    . cholecalciferol (VITAMIN D) 25 MCG (1000 UNIT) tablet Take 2,000 Units by mouth in the morning.    Eduard Roux (AIMOVIG) 70 MG/ML SOAJ Inject 70 mg into the skin every 28 (twenty-eight) days. 1.12 mL 5  . fluticasone (FLONASE) 50 MCG/ACT nasal spray Place 2 sprays into both nostrils 2 (two) times daily as needed for allergies or rhinitis.    Marland Kitchen levocetirizine (XYZAL) 5 MG tablet Take 5 mg by mouth at bedtime as needed for allergies.    . mirtazapine (REMERON) 15 MG tablet Take 1 tablet (15 mg total) by mouth at bedtime. 90 tablet 0  . nitroGLYCERIN (NITRO-DUR) 0.2 mg/hr patch Place 1 patch (0.2 mg total) onto the skin daily. 30 patch  2  . Nutritional Supplements (WOMENS FORMULA MENOPAUSE PO) Take 2 capsules by mouth in the morning and at bedtime.    . rizatriptan (MAXALT) 10 MG tablet Take 10 mg by mouth daily as needed for migraine.     . topiramate (TOPAMAX) 25 MG tablet Take 1 tablet (25 mg total) by mouth at bedtime. 30 tablet 2   No current facility-administered medications for this visit.    Medication Side Effects: Other: Wt gain  Allergies:  Allergies   Allergen Reactions  . Gabapentin Other (See Comments)  . Polymyxin B Other (See Comments)    Eyes go blood red  . Cymbalta [Duloxetine Hcl] Other (See Comments)    Headaches, constipation  . Duloxetine Other (See Comments)    HEADACHES CONSTIPATION  . Lamotrigine Rash  . Other Other (See Comments)    OTOBIOTIC > RED EYES  . Pregabalin Rash    Itchy red rash on chest  . Prozac [Fluoxetine] Other (See Comments)    CONSTIPATION  . Zolpidem Rash    "sleep walk"    Past Medical History:  Diagnosis Date  . Allergic rhinitis   . Anxiety   . Arthritis   . Cancer ([place])    skin  . Chronic pain   . Chronically dry eyes   . Depression   . Fibromyalgia   . GERD (gastroesophageal reflux disease)   . Headache   . Vitamin D deficiency   . Wears glasses     Past Medical History, Surgical history, Social history, and Family history were reviewed and updated as appropriate.   Please see review of systems for further details on the patient's review from today.   Objective:   Physical Exam:  There were no vitals taken for this visit.  Physical Exam Constitutional:      General: She is not in acute distress. Musculoskeletal:        General: No deformity.  Neurological:     Mental Status: She is alert and oriented to person, place, and time.     Coordination: Coordination normal.  Psychiatric:        Attention and Perception: Attention and perception normal. She does not perceive auditory or visual hallucinations.        Mood and Affect: Mood normal. Mood is not anxious or depressed. Affect is not labile, blunt, angry or inappropriate.        Speech: Speech normal.        Behavior: Behavior normal.        Thought Content: Thought content normal. Thought content is not paranoid or delusional. Thought content does not include homicidal or suicidal ideation. Thought content does not include homicidal or suicidal plan.        Cognition and Memory: Cognition and memory normal.         Judgment: Judgment normal.     Comments: Insight intact     Lab Review:     Component Value Date/Time   NA 141 09/10/2020 0918   K 3.9 09/10/2020 0918   CL 108 09/10/2020 0918   CO2 26 09/10/2020 0918   GLUCOSE 85 09/10/2020 0918   BUN 9 09/10/2020 0918   CREATININE 0.76 09/10/2020 0918   CALCIUM 9.6 09/10/2020 0918   PROT 7.5 09/10/2020 0918   ALBUMIN 4.1 09/10/2020 0918   AST 31 09/10/2020 0918   ALT 28 09/10/2020 0918   ALKPHOS 68 09/10/2020 0918   BILITOT 0.6 09/10/2020 0918   GFRNONAA >60 09/10/2020 0918   GFRAA >  60 06/26/2019 1008       Component Value Date/Time   WBC 6.8 09/10/2020 0918   RBC 4.44 09/10/2020 0918   HGB 13.7 09/10/2020 0918   HCT 42.9 09/10/2020 0918   PLT 278 09/10/2020 0918   MCV 96.6 09/10/2020 0918   MCH 30.9 09/10/2020 0918   MCHC 31.9 09/10/2020 0918   RDW 13.0 09/10/2020 0918   LYMPHSABS 2.0 09/10/2020 0918   MONOABS 0.3 09/10/2020 0918   EOSABS 0.1 09/10/2020 0918   BASOSABS 0.0 09/10/2020 0918    No results found for: POCLITH, LITHIUM   No results found for: PHENYTOIN, PHENOBARB, VALPROATE, CBMZ   .res Assessment: Plan:    Sharon Logan was seen today for follow-up.  Diagnoses and all orders for this visit:  Moderate recurrent major depression (HCC) -     Desvenlafaxine Succinate ER 25 MG TB24; Take 25 mg by mouth every morning.  PTSD (post-traumatic stress disorder)      Patient seen for 30 minutes and time spent counseling patient regarding possible treatment options.  She reports that mirtazapine has been helpful for insomnia and anxiety, however it has caused weight gain and she prefers taking it only as needed.  Patient asks about possible treatment options that would also improve her chronic pain.  Discussed that she has tried multiple medications with indications for  pain and has had adverse reactions, to include Cymbalta, Lyrica, and gabapentin.  Discussed that SNRIs theoretically may help with with pain since Savella and  Cymbalta have indications for pain.  Discussed that Pristiq is an SNRI, however treatment pain is an off label indication and that it is indicated for depression.  Discussed potential benefits, risks, and side effects of Pristiq.  Patient agrees to trial of low-dose Pristiq.  Will start Pristiq 25 mg daily for depression and anxiety. Continue Remeron 15 mg at bedtime as needed for insomnia and anxiety. Patient advised to contact office with any questions, adverse effects, or acute worsening in signs and symptoms.   Please see After Visit Summary for patient specific instructions.  Future Appointments  Date Time Provider [place]  11/07/2020  2:15 PM Trula Slade, [place] [facility]  12/09/2020 11:00 AM Thayer Headings, PMHNP [facility] None  12/24/2020 11:00 AM Raulkar, Clide Deutscher, MD [facility]  03/13/2021  9:00 AM Edgardo Roys, PsyD [facility]  03/26/2021 10:10 AM Pieter Partridge, DO [facility] None    No orders of the defined types were placed in this encounter.   -------------------------------

## 2020-10-28 DIAGNOSIS — M2560 Stiffness of unspecified joint, not elsewhere classified: Secondary | ICD-10-CM | POA: Diagnosis not present

## 2020-10-28 DIAGNOSIS — M546 Pain in thoracic spine: Secondary | ICD-10-CM | POA: Diagnosis not present

## 2020-10-28 DIAGNOSIS — M5451 Vertebrogenic low back pain: Secondary | ICD-10-CM | POA: Diagnosis not present

## 2020-10-28 DIAGNOSIS — M6281 Muscle weakness (generalized): Secondary | ICD-10-CM | POA: Diagnosis not present

## 2020-10-28 DIAGNOSIS — Z9889 Other specified postprocedural states: Secondary | ICD-10-CM | POA: Diagnosis not present

## 2020-10-28 DIAGNOSIS — M5416 Radiculopathy, lumbar region: Secondary | ICD-10-CM | POA: Diagnosis not present

## 2020-10-30 DIAGNOSIS — M9901 Segmental and somatic dysfunction of cervical region: Secondary | ICD-10-CM | POA: Diagnosis not present

## 2020-10-30 DIAGNOSIS — M50322 Other cervical disc degeneration at C5-C6 level: Secondary | ICD-10-CM | POA: Diagnosis not present

## 2020-10-30 DIAGNOSIS — M9902 Segmental and somatic dysfunction of thoracic region: Secondary | ICD-10-CM | POA: Diagnosis not present

## 2020-10-30 DIAGNOSIS — M546 Pain in thoracic spine: Secondary | ICD-10-CM | POA: Diagnosis not present

## 2020-10-30 DIAGNOSIS — M5134 Other intervertebral disc degeneration, thoracic region: Secondary | ICD-10-CM | POA: Diagnosis not present

## 2020-10-31 DIAGNOSIS — M5134 Other intervertebral disc degeneration, thoracic region: Secondary | ICD-10-CM | POA: Diagnosis not present

## 2020-10-31 DIAGNOSIS — M48061 Spinal stenosis, lumbar region without neurogenic claudication: Secondary | ICD-10-CM | POA: Diagnosis not present

## 2020-10-31 DIAGNOSIS — M797 Fibromyalgia: Secondary | ICD-10-CM | POA: Diagnosis not present

## 2020-10-31 DIAGNOSIS — M9901 Segmental and somatic dysfunction of cervical region: Secondary | ICD-10-CM | POA: Diagnosis not present

## 2020-10-31 DIAGNOSIS — S73191A Other sprain of right hip, initial encounter: Secondary | ICD-10-CM | POA: Diagnosis not present

## 2020-10-31 DIAGNOSIS — M50322 Other cervical disc degeneration at C5-C6 level: Secondary | ICD-10-CM | POA: Diagnosis not present

## 2020-10-31 DIAGNOSIS — Z79899 Other long term (current) drug therapy: Secondary | ICD-10-CM | POA: Diagnosis not present

## 2020-10-31 DIAGNOSIS — R030 Elevated blood-pressure reading, without diagnosis of hypertension: Secondary | ICD-10-CM | POA: Diagnosis not present

## 2020-10-31 DIAGNOSIS — M9902 Segmental and somatic dysfunction of thoracic region: Secondary | ICD-10-CM | POA: Diagnosis not present

## 2020-11-04 DIAGNOSIS — M5451 Vertebrogenic low back pain: Secondary | ICD-10-CM | POA: Diagnosis not present

## 2020-11-04 DIAGNOSIS — M9901 Segmental and somatic dysfunction of cervical region: Secondary | ICD-10-CM | POA: Diagnosis not present

## 2020-11-04 DIAGNOSIS — M6281 Muscle weakness (generalized): Secondary | ICD-10-CM | POA: Diagnosis not present

## 2020-11-04 DIAGNOSIS — M2560 Stiffness of unspecified joint, not elsewhere classified: Secondary | ICD-10-CM | POA: Diagnosis not present

## 2020-11-04 DIAGNOSIS — M5416 Radiculopathy, lumbar region: Secondary | ICD-10-CM | POA: Diagnosis not present

## 2020-11-04 DIAGNOSIS — M50322 Other cervical disc degeneration at C5-C6 level: Secondary | ICD-10-CM | POA: Diagnosis not present

## 2020-11-04 DIAGNOSIS — M5134 Other intervertebral disc degeneration, thoracic region: Secondary | ICD-10-CM | POA: Diagnosis not present

## 2020-11-04 DIAGNOSIS — M9902 Segmental and somatic dysfunction of thoracic region: Secondary | ICD-10-CM | POA: Diagnosis not present

## 2020-11-05 ENCOUNTER — Other Ambulatory Visit: Payer: Self-pay | Admitting: Family Medicine

## 2020-11-05 DIAGNOSIS — R9389 Abnormal findings on diagnostic imaging of other specified body structures: Secondary | ICD-10-CM

## 2020-11-06 DIAGNOSIS — M2560 Stiffness of unspecified joint, not elsewhere classified: Secondary | ICD-10-CM | POA: Diagnosis not present

## 2020-11-06 DIAGNOSIS — M6281 Muscle weakness (generalized): Secondary | ICD-10-CM | POA: Diagnosis not present

## 2020-11-06 DIAGNOSIS — M5416 Radiculopathy, lumbar region: Secondary | ICD-10-CM | POA: Diagnosis not present

## 2020-11-06 DIAGNOSIS — M5451 Vertebrogenic low back pain: Secondary | ICD-10-CM | POA: Diagnosis not present

## 2020-11-07 ENCOUNTER — Other Ambulatory Visit: Payer: Self-pay

## 2020-11-07 ENCOUNTER — Ambulatory Visit (INDEPENDENT_AMBULATORY_CARE_PROVIDER_SITE_OTHER): Payer: PPO | Admitting: Podiatry

## 2020-11-07 ENCOUNTER — Telehealth: Payer: Self-pay | Admitting: Psychiatry

## 2020-11-07 DIAGNOSIS — M2142 Flat foot [pes planus] (acquired), left foot: Secondary | ICD-10-CM | POA: Diagnosis not present

## 2020-11-07 DIAGNOSIS — M2141 Flat foot [pes planus] (acquired), right foot: Secondary | ICD-10-CM | POA: Diagnosis not present

## 2020-11-07 DIAGNOSIS — M722 Plantar fascial fibromatosis: Secondary | ICD-10-CM

## 2020-11-07 DIAGNOSIS — M9902 Segmental and somatic dysfunction of thoracic region: Secondary | ICD-10-CM | POA: Diagnosis not present

## 2020-11-07 DIAGNOSIS — F331 Major depressive disorder, recurrent, moderate: Secondary | ICD-10-CM

## 2020-11-07 DIAGNOSIS — M9901 Segmental and somatic dysfunction of cervical region: Secondary | ICD-10-CM | POA: Diagnosis not present

## 2020-11-07 DIAGNOSIS — M21961 Unspecified acquired deformity of right lower leg: Secondary | ICD-10-CM | POA: Diagnosis not present

## 2020-11-07 DIAGNOSIS — M50322 Other cervical disc degeneration at C5-C6 level: Secondary | ICD-10-CM | POA: Diagnosis not present

## 2020-11-07 DIAGNOSIS — M779 Enthesopathy, unspecified: Secondary | ICD-10-CM | POA: Diagnosis not present

## 2020-11-07 DIAGNOSIS — M5134 Other intervertebral disc degeneration, thoracic region: Secondary | ICD-10-CM | POA: Diagnosis not present

## 2020-11-07 MED ORDER — DESVENLAFAXINE SUCCINATE ER 50 MG PO TB24
50.0000 mg | ORAL_TABLET | Freq: Every day | ORAL | 1 refills | Status: DC
Start: 2020-11-07 — End: 2020-12-09

## 2020-11-07 NOTE — Patient Instructions (Signed)

## 2020-11-07 NOTE — Telephone Encounter (Signed)
Received written message from pt stating that Pristiq "is a good fit" and she is taking two 25 mg tabs and would like 50 mg tabs sent to pharmacy. She indicates she is taking "mirtazapine every night now because of pain." Script sent for Pristiq 50 mg.

## 2020-11-08 ENCOUNTER — Other Ambulatory Visit: Payer: PPO

## 2020-11-10 NOTE — Progress Notes (Signed)
Subjective: 65 year old female presents the office today for follow evaluation of right foot pain.  She states she is doing better.  The Nitropatch has been helping she is asking for refill but is the generic version.  She has been using some Planter fasciitis symptoms.  Describes discomfort along the heel without any recent injury.  No swelling.  She wanted to discuss shoe options to help. Denies any systemic complaints such as fevers, chills, nausea, vomiting. No acute changes since last appointment, and no other complaints at this time.   Objective: AAO x3, NAD DP/PT pulses palpable bilaterally, CRT less than 3 seconds Tenderness no significant tenderness palpation along the right foot and second MPJ.  No edema, erythema.  Decreasing to motion of the first MPJ without any discomfort.  She does get some discomfort of the plantar aspect calcaneus and fissures on the plantar fashion for the plantar fascial first intact.  No pain with lateral compression of calcaneus.  No edema, erythema.  MMT 5/5. No pain with calf compression, swelling, warmth, erythema  Assessment: Right foot pain, capsulitis; plan fasciitis  Plan: -All treatment options discussed with the patient including all alternatives, risks, complications.  -Overall doing better.  We will redo the Nitropatch as an generic version.  We will need to contact her insurance company as she brought me a letter about this today.  Regards to the plantar fasciitis we discussed stretching, icing daily.  Hold off on steroid injection but offered this today.  Discussed different shoe modifications as well.  Trula Slade DPM

## 2020-11-12 ENCOUNTER — Ambulatory Visit
Admission: RE | Admit: 2020-11-12 | Discharge: 2020-11-12 | Disposition: A | Discharge status: Home / Self Care | Payer: PPO | Source: Ambulatory Visit | Attending: Family Medicine | Admitting: Family Medicine

## 2020-11-12 ENCOUNTER — Other Ambulatory Visit: Payer: Self-pay

## 2020-11-12 DIAGNOSIS — M5451 Vertebrogenic low back pain: Secondary | ICD-10-CM | POA: Diagnosis not present

## 2020-11-12 DIAGNOSIS — M5416 Radiculopathy, lumbar region: Secondary | ICD-10-CM | POA: Diagnosis not present

## 2020-11-12 DIAGNOSIS — J986 Disorders of diaphragm: Secondary | ICD-10-CM | POA: Diagnosis not present

## 2020-11-12 DIAGNOSIS — R9389 Abnormal findings on diagnostic imaging of other specified body structures: Secondary | ICD-10-CM

## 2020-11-12 DIAGNOSIS — M6281 Muscle weakness (generalized): Secondary | ICD-10-CM | POA: Diagnosis not present

## 2020-11-12 DIAGNOSIS — M2560 Stiffness of unspecified joint, not elsewhere classified: Secondary | ICD-10-CM | POA: Diagnosis not present

## 2020-11-12 IMAGING — CT CT CHEST W/ CM
1 series · 15 of 34 positions shown, 19 images · IV contrast (APPLIED)
Comparison: [facility] thoracic spine radiographs [DATE].
CT Abdomen and Pelvis [DATE].

CLINICAL DATA: 65-year-old female with thoracic spine radiographs
for back pain earlier this month and possible abnormal density over
the lower thoracic levels on the lateral view. Scapula area pain for
1 month.

Creatinine was obtained on site at [HOSPITAL] at [HOSPITAL].
Results: Creatinine 0.9 mg/dL.
EXAM:
CT CHEST WITH CONTRAST
TECHNIQUE: Multidetector CT imaging of the chest was performed during
intravenous contrast administration.
CONTRAST:  75mL [93] IOPAMIDOL ([93]) INJECTION 61%

[Series 2: chest w/cm · axial · 0.65mm/px · z∈[-317,-47]mm · 15 of 159 slices shown, 19 images]
[im 12/159  mediastinal]
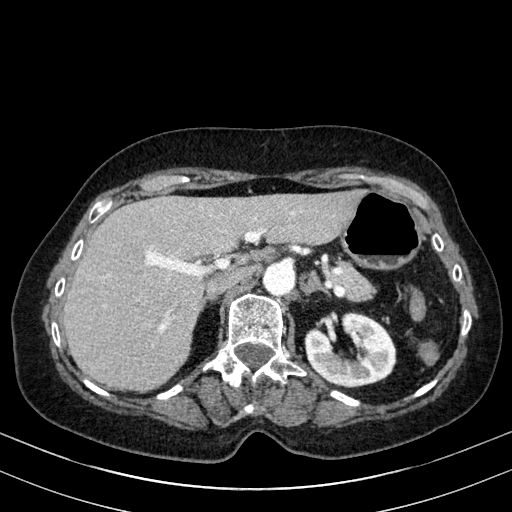
[im 12/159  lung]
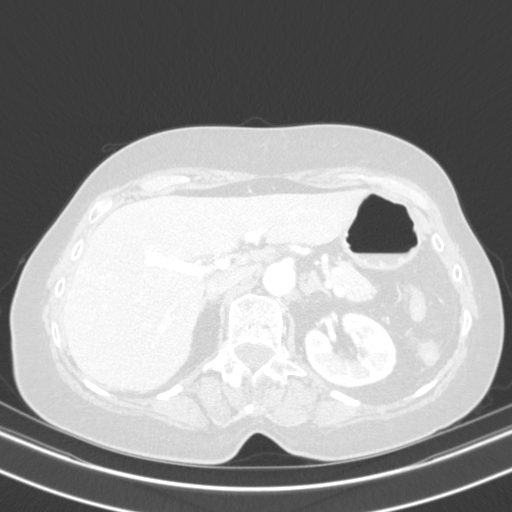
[im 24/159  lung]
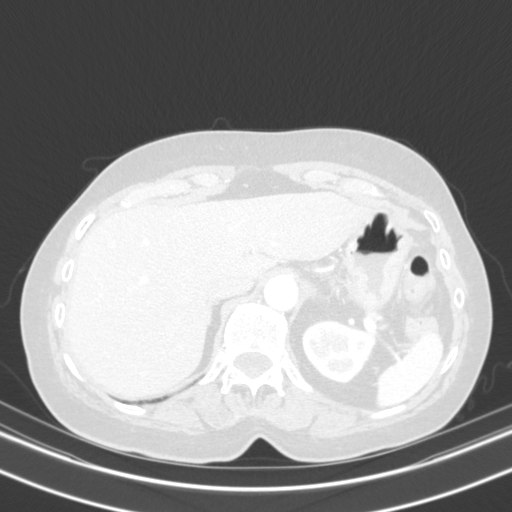
[im 32/159  lung]
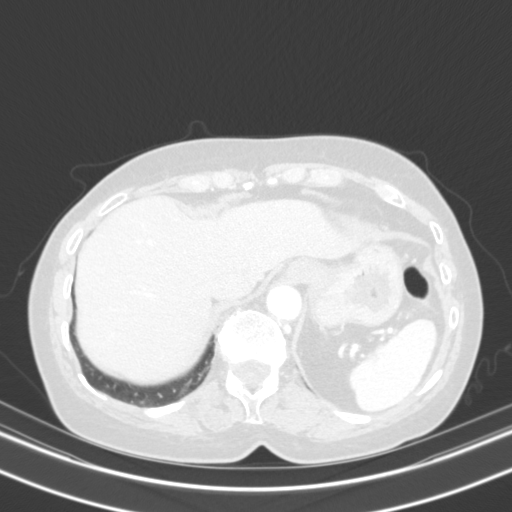
[im 41/159  lung]
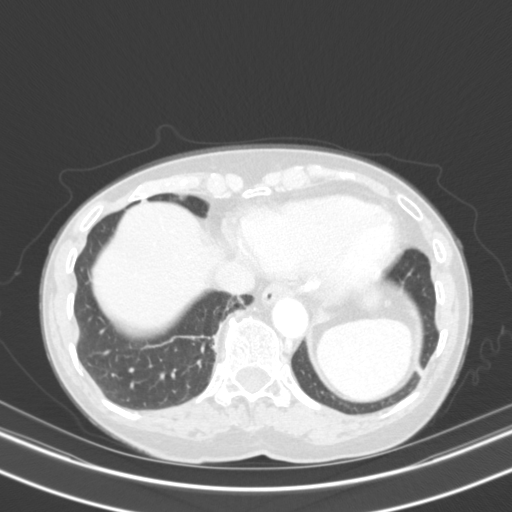
[im 53/159  mediastinal]
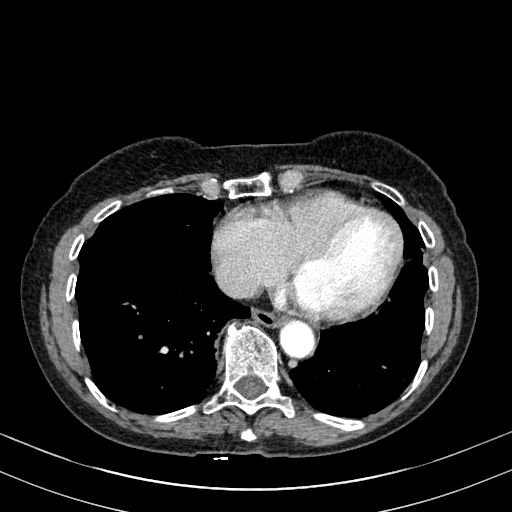
[im 53/159  lung]
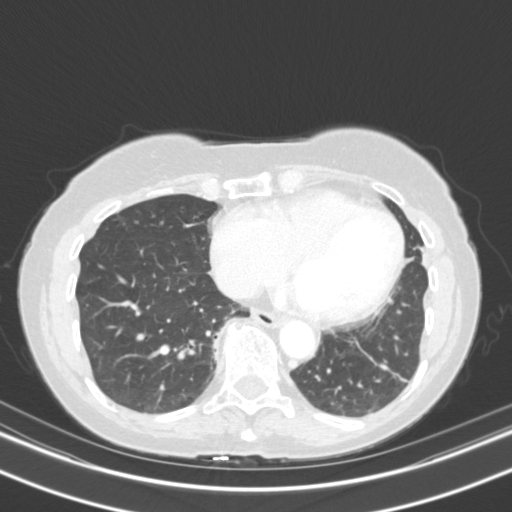
[im 64/159  lung]
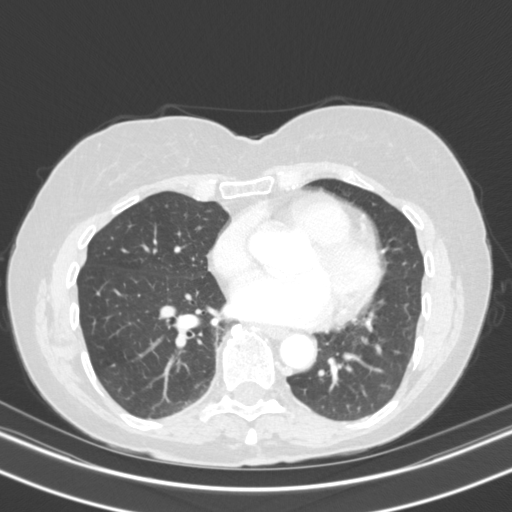
[im 71/159  lung]
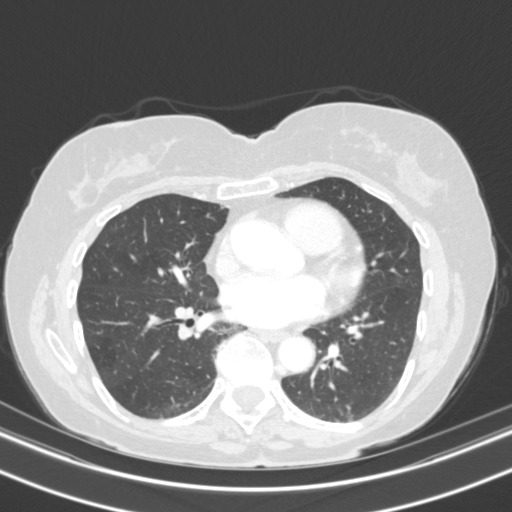
[im 82/159  lung]
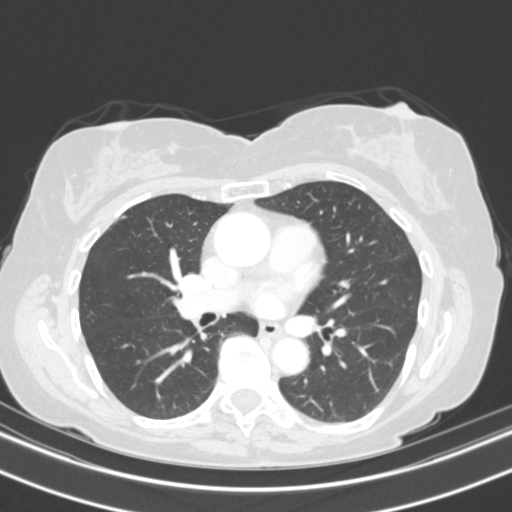
[im 88/159  mediastinal]
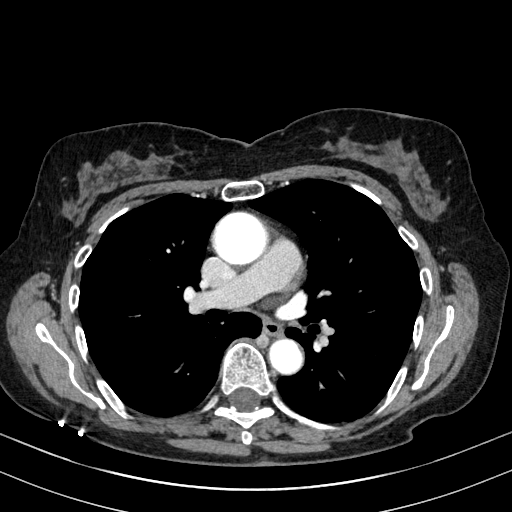
[im 88/159  lung]
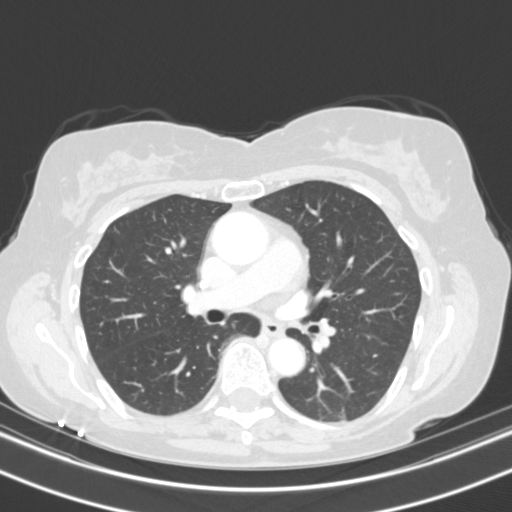
[im 95/159  lung]
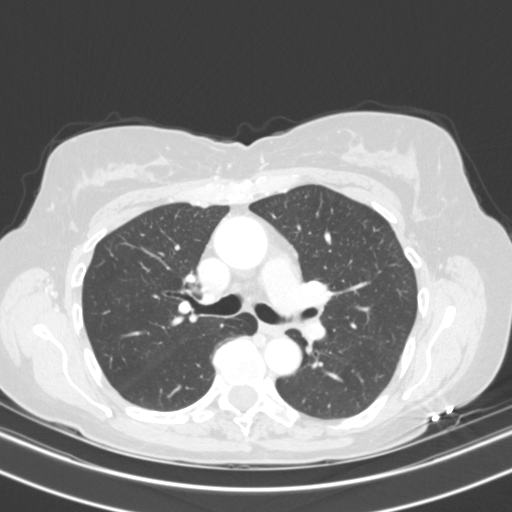
[im 106/159  lung]
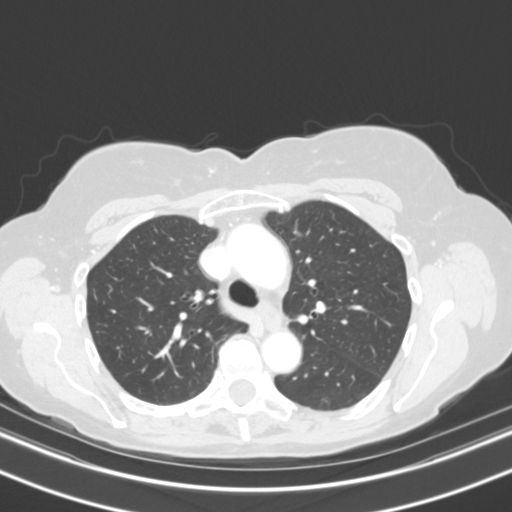
[im 118/159  lung]
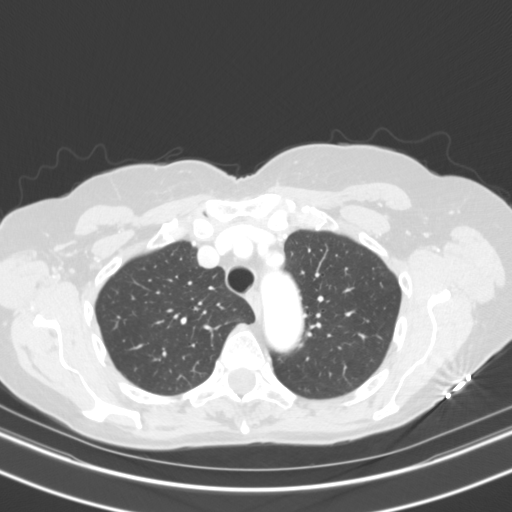
[im 127/159  mediastinal]
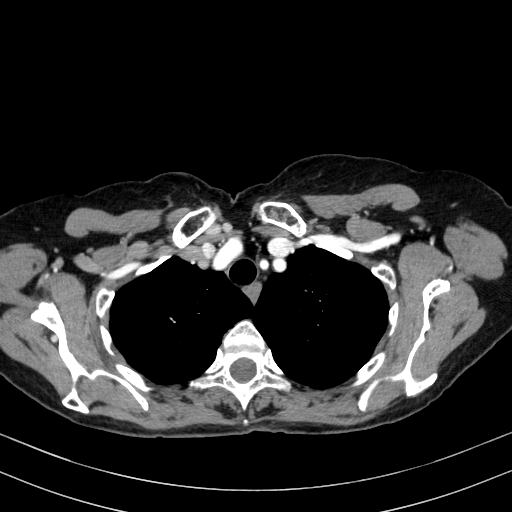
[im 127/159  lung]
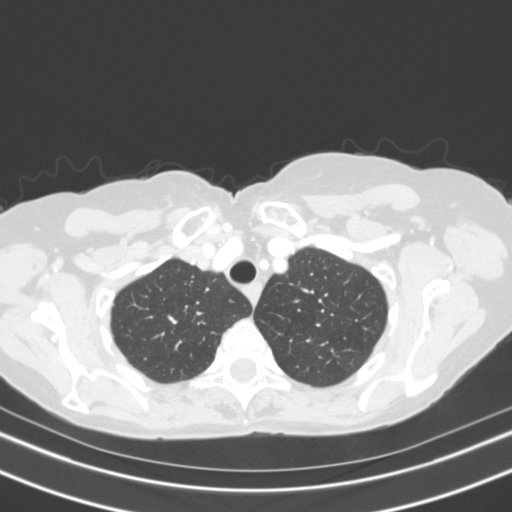
[im 135/159  lung]
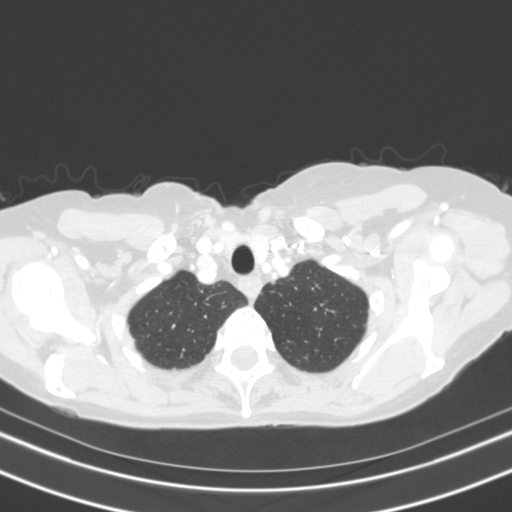
[im 147/159  lung]
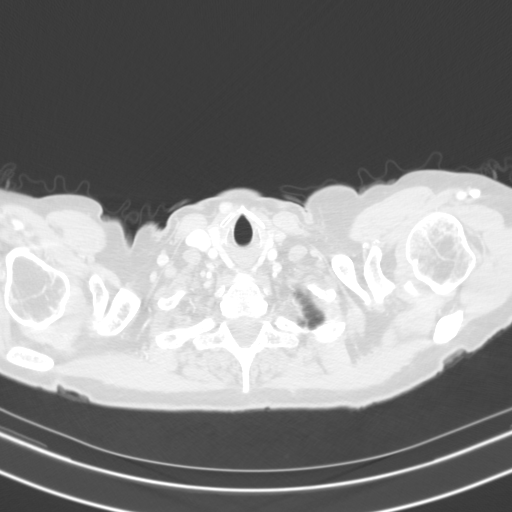

[15 of 34 positions shown; findings below may reference images not displayed]

FINDINGS: Cardiovascular: Minor soft plaque of the thoracic aorta. Three
vessel arch configuration. Cardiac size is stable since [93], within
normal limits. No pericardial effusion.

Mediastinum/Nodes: Negative.  No lymphadenopathy.

Lungs/Pleura: Mild asymmetric elevation of the posterior left
hemidiaphragm with subjacent spleen (series 3, image 102) which is
felt to be the the finding related to increased density in the
posterior chest over the lower thoracic spine on the recent lateral
radiographs.

Major airways are patent. There is mild atelectasis or scarring in
both middle and lower lobes. Otherwise the lungs are negative.

Upper Abdomen: Negative visible liver, spleen, pancreas, adrenal
glands, kidneys and bowel in the upper abdomen.

Musculoskeletal: Thoracic spine appears negative for age, with a T8
superior endplate Schmorl's node suspected on series 4, image 80.
The thoracic spinal canal is capacious. No acute or suspicious
osseous abnormality. And the thoracic paraspinal soft tissues are
negative. Asymmetric elevation of the posterior left hemidiaphragm
felt related to the density on recent radiographs as detailed in the
lung section above.
IMPRESSION: 1. Asymmetric elevation of the posterior left hemidiaphragm (normal
variant) appears to account for the radiographic density earlier
this month. Negative for age thoracic spine.
2. Negative CT appearance of the Chest.

## 2020-11-12 MED ORDER — IOPAMIDOL (ISOVUE-300) INJECTION 61%
75.0000 mL | Freq: Once | INTRAVENOUS | Status: AC | PRN
  Administered 2020-11-12: 75 mL via INTRAVENOUS

## 2020-11-13 DIAGNOSIS — M50322 Other cervical disc degeneration at C5-C6 level: Secondary | ICD-10-CM | POA: Diagnosis not present

## 2020-11-13 DIAGNOSIS — M5134 Other intervertebral disc degeneration, thoracic region: Secondary | ICD-10-CM | POA: Diagnosis not present

## 2020-11-13 DIAGNOSIS — M9902 Segmental and somatic dysfunction of thoracic region: Secondary | ICD-10-CM | POA: Diagnosis not present

## 2020-11-13 DIAGNOSIS — M9901 Segmental and somatic dysfunction of cervical region: Secondary | ICD-10-CM | POA: Diagnosis not present

## 2020-11-14 DIAGNOSIS — M6281 Muscle weakness (generalized): Secondary | ICD-10-CM | POA: Diagnosis not present

## 2020-11-14 DIAGNOSIS — M5451 Vertebrogenic low back pain: Secondary | ICD-10-CM | POA: Diagnosis not present

## 2020-11-14 DIAGNOSIS — M549 Dorsalgia, unspecified: Secondary | ICD-10-CM | POA: Diagnosis not present

## 2020-11-14 DIAGNOSIS — R208 Other disturbances of skin sensation: Secondary | ICD-10-CM | POA: Diagnosis not present

## 2020-11-14 DIAGNOSIS — M2560 Stiffness of unspecified joint, not elsewhere classified: Secondary | ICD-10-CM | POA: Diagnosis not present

## 2020-11-14 DIAGNOSIS — M5416 Radiculopathy, lumbar region: Secondary | ICD-10-CM | POA: Diagnosis not present

## 2020-11-15 ENCOUNTER — Telehealth: Payer: Self-pay | Admitting: *Deleted

## 2020-11-15 DIAGNOSIS — L74510 Primary focal hyperhidrosis, axilla: Secondary | ICD-10-CM | POA: Diagnosis not present

## 2020-11-15 DIAGNOSIS — M797 Fibromyalgia: Secondary | ICD-10-CM | POA: Diagnosis not present

## 2020-11-15 DIAGNOSIS — Z79899 Other long term (current) drug therapy: Secondary | ICD-10-CM | POA: Diagnosis not present

## 2020-11-15 DIAGNOSIS — M48061 Spinal stenosis, lumbar region without neurogenic claudication: Secondary | ICD-10-CM | POA: Diagnosis not present

## 2020-11-15 NOTE — Telephone Encounter (Signed)
Tried to call the pharmacy at [place] and they are closed till 2 pm today. Lattie Haw

## 2020-11-18 ENCOUNTER — Telehealth: Payer: Self-pay | Admitting: *Deleted

## 2020-11-18 DIAGNOSIS — M2560 Stiffness of unspecified joint, not elsewhere classified: Secondary | ICD-10-CM | POA: Diagnosis not present

## 2020-11-18 DIAGNOSIS — M5416 Radiculopathy, lumbar region: Secondary | ICD-10-CM | POA: Diagnosis not present

## 2020-11-18 DIAGNOSIS — M5451 Vertebrogenic low back pain: Secondary | ICD-10-CM | POA: Diagnosis not present

## 2020-11-18 DIAGNOSIS — M6281 Muscle weakness (generalized): Secondary | ICD-10-CM | POA: Diagnosis not present

## 2020-11-18 NOTE — Telephone Encounter (Signed)
Called and spoke with Minna Merritts at the [facility] [phone]) and relayed the message per Dr Jacqualyn Posey that the patient can get the brand name for the nitroglycerin patch. Lattie Haw

## 2020-11-21 DIAGNOSIS — M5451 Vertebrogenic low back pain: Secondary | ICD-10-CM | POA: Diagnosis not present

## 2020-11-21 DIAGNOSIS — M5416 Radiculopathy, lumbar region: Secondary | ICD-10-CM | POA: Diagnosis not present

## 2020-11-21 DIAGNOSIS — M2560 Stiffness of unspecified joint, not elsewhere classified: Secondary | ICD-10-CM | POA: Diagnosis not present

## 2020-11-21 DIAGNOSIS — M6281 Muscle weakness (generalized): Secondary | ICD-10-CM | POA: Diagnosis not present

## 2020-11-25 ENCOUNTER — Telehealth: Payer: Self-pay | Admitting: Podiatry

## 2020-11-25 DIAGNOSIS — M6281 Muscle weakness (generalized): Secondary | ICD-10-CM | POA: Diagnosis not present

## 2020-11-25 DIAGNOSIS — M5416 Radiculopathy, lumbar region: Secondary | ICD-10-CM | POA: Diagnosis not present

## 2020-11-25 DIAGNOSIS — M5451 Vertebrogenic low back pain: Secondary | ICD-10-CM | POA: Diagnosis not present

## 2020-11-25 DIAGNOSIS — M2560 Stiffness of unspecified joint, not elsewhere classified: Secondary | ICD-10-CM | POA: Diagnosis not present

## 2020-11-25 NOTE — Telephone Encounter (Signed)
Patient is requesting prescription for generic nitroglycerine patches (patient states it must be generic brand)   Send to pharmacy:  [facility]  Or mail order pharmacy: [phone]

## 2020-11-26 ENCOUNTER — Other Ambulatory Visit: Payer: Self-pay | Admitting: Podiatry

## 2020-11-26 MED ORDER — NITROGLYCERIN 0.2 MG/HR TD PT24: 0.2000 mg | MEDICATED_PATCH | Freq: Every day | TRANSDERMAL | 2 refills | Status: DC

## 2020-11-28 DIAGNOSIS — M5416 Radiculopathy, lumbar region: Secondary | ICD-10-CM | POA: Diagnosis not present

## 2020-11-28 DIAGNOSIS — M5451 Vertebrogenic low back pain: Secondary | ICD-10-CM | POA: Diagnosis not present

## 2020-11-28 DIAGNOSIS — M2560 Stiffness of unspecified joint, not elsewhere classified: Secondary | ICD-10-CM | POA: Diagnosis not present

## 2020-11-28 DIAGNOSIS — M6281 Muscle weakness (generalized): Secondary | ICD-10-CM | POA: Diagnosis not present

## 2020-12-02 DIAGNOSIS — M797 Fibromyalgia: Secondary | ICD-10-CM | POA: Diagnosis not present

## 2020-12-02 DIAGNOSIS — M48061 Spinal stenosis, lumbar region without neurogenic claudication: Secondary | ICD-10-CM | POA: Diagnosis not present

## 2020-12-02 DIAGNOSIS — R252 Cramp and spasm: Secondary | ICD-10-CM | POA: Diagnosis not present

## 2020-12-02 DIAGNOSIS — Z79899 Other long term (current) drug therapy: Secondary | ICD-10-CM | POA: Diagnosis not present

## 2020-12-02 DIAGNOSIS — M542 Cervicalgia: Secondary | ICD-10-CM | POA: Diagnosis not present

## 2020-12-03 DIAGNOSIS — M9902 Segmental and somatic dysfunction of thoracic region: Secondary | ICD-10-CM | POA: Diagnosis not present

## 2020-12-03 DIAGNOSIS — M9901 Segmental and somatic dysfunction of cervical region: Secondary | ICD-10-CM | POA: Diagnosis not present

## 2020-12-03 DIAGNOSIS — M5134 Other intervertebral disc degeneration, thoracic region: Secondary | ICD-10-CM | POA: Diagnosis not present

## 2020-12-03 DIAGNOSIS — M50322 Other cervical disc degeneration at C5-C6 level: Secondary | ICD-10-CM | POA: Diagnosis not present

## 2020-12-09 ENCOUNTER — Other Ambulatory Visit: Payer: Self-pay

## 2020-12-09 ENCOUNTER — Encounter: Payer: Self-pay | Admitting: Psychiatry

## 2020-12-09 ENCOUNTER — Ambulatory Visit (INDEPENDENT_AMBULATORY_CARE_PROVIDER_SITE_OTHER): Payer: PPO | Admitting: Psychiatry

## 2020-12-09 DIAGNOSIS — F331 Major depressive disorder, recurrent, moderate: Secondary | ICD-10-CM

## 2020-12-09 DIAGNOSIS — F4310 Post-traumatic stress disorder, unspecified: Secondary | ICD-10-CM

## 2020-12-09 MED ORDER — DESVENLAFAXINE SUCCINATE ER 50 MG PO TB24: 50.0000 mg | ORAL_TABLET | Freq: Every day | ORAL | 2 refills | Status: DC

## 2020-12-09 MED ORDER — MIRTAZAPINE 15 MG PO TABS: ORAL_TABLET | ORAL | 0 refills | Status: DC

## 2020-12-09 NOTE — Progress Notes (Signed)
Sharon Logan [id number] July 29, 1955 65 y.o.  Subjective:   Patient ID:  Sharon Logan is a 65 y.o. (DOB Nov 25, 1955) female.  Chief Complaint:  Chief Complaint  Patient presents with   Follow-up    H/o Depression, anxiety, and insomnia    HPI Sharon Logan presents to the office today for follow-up of depression, anxiety, and insomnia. She reports that she would like to come off Remeron due to "inner restlessness" and dry eyes. She reports that increased appetite was reduced with Pristiq. She reports, "overall I think my mood has been good." She reports that she feels somewhat "dampened down." She reports that she has not been wanting to get out of bed. Very low motivation. Energy has been ok. She reports that she feels limited by back pain and will have increased pain after over-doing it. Denies sad mood. "The anxiety has been very good" and denies anxiety since March. She reports that her sleep has been good overall. Concentration has been adequate. Has been enjoying her puppy. Denies anhedonia. Denies SI.   She reports that she is getting out some.   Past Medication Trials: Lexapro- "My anti-depressant of choice" and gives her energy. Was taking 2-3 years prior to surgery. Re-started one week ago. Has taken 10 mg and 20 mg doses in the past. Thinks it may have helped with pain in the past. Cymbalta- Started after back surgery. Has made her tired, even when reduced to 30 mg. Was having to take 2 hour naps. May have helped slightly with pain. Had HA, Constipation, Nausea Savella- muscle aches, rhabdomyolysis Effexor-HA, helped with excessive sweating.  Pristiq Prozac- Constipation. Causes her to feel cold.  Celexa Lexapro Zoloft- worsening mood Paxil Wellbutrin- Did not cause HA's. Tolerated well and was effective for depression. Amitriptyline- Did not cause HA's. Weight gain. Effective.  Nortriptyline- Did not cause HA's. Effective. Lamictal- Rash Gabapentin -Rhabomyolysis Lyrica-  Adverse reaction Buspar Trazodone Ambien- Parasomnia Lunesta- Tolerated, effective at 2 mg dose Remeron- Dry eyes, internal restlessness Clonidine Topamax- ineffective for headaches  PHQ2-9    Flowsheet Row Office Visit from 10/08/2020 in [facility]  PHQ-2 Total Score 2  PHQ-9 Total Score 8      Flowsheet Row Admission (Discharged) from 09/12/2020 in [facility] from 09/10/2020 in [facility] PREADMISSION TESTING  C-SSRS RISK CATEGORY No Risk No Risk        Review of Systems:  Review of Systems  Musculoskeletal:  Negative for gait problem.       She reports some slight improvement in pain with Pristiq  Psychiatric/Behavioral:         Please refer to HPI   Medications: I have reviewed the patient's current medications.  Current Outpatient Medications  Medication Sig Dispense Refill   b complex vitamins capsule Take 1 capsule by mouth daily.     baclofen (LIORESAL) 10 MG tablet Take 10 mg by mouth every 8 (eight) hours as needed for muscle spasms.     Carboxymethylcellulose Sodium (THERATEARS OP) Apply 1 drop to eye 4 (four) times daily as needed (dry eyes).     cholecalciferol (VITAMIN D) 25 MCG (1000 UNIT) tablet Take 2,000 Units by mouth in the morning.     Erenumab-aooe (AIMOVIG) 70 MG/ML SOAJ Inject 70 mg into the skin every 28 (twenty-eight) days. 1.12 mL 5   fluticasone (FLONASE) 50 MCG/ACT nasal spray Place 2 sprays into both nostrils 2 (two) times daily as  needed for allergies or rhinitis.     levocetirizine (XYZAL) 5 MG tablet Take 5 mg by mouth at bedtime as needed for allergies.     nitroGLYCERIN (NITRO-DUR) 0.2 mg/hr patch Place 1 patch (0.2 mg total) onto the skin daily. 30 patch 2   rizatriptan (MAXALT) 10 MG tablet Take 10 mg by mouth daily as needed for migraine.      desvenlafaxine (PRISTIQ) 50 MG 24 hr tablet Take 1 tablet (50 mg total) by mouth daily. 30 tablet 2    mirtazapine (REMERON) 15 MG tablet Decrease to 1/2 tablet at bedtime for one week, then stop 90 tablet 0   topiramate (TOPAMAX) 25 MG tablet Take 1 tablet (25 mg total) by mouth at bedtime. (Patient not taking: Reported on 12/09/2020) 30 tablet 2   No current facility-administered medications for this visit.    Medication Side Effects: Other: Dry eyes, inner restlessness  Allergies:  Allergies  Allergen Reactions   Gabapentin Other (See Comments)   Polymyxin B Other (See Comments)    Eyes go blood red   Cymbalta [Duloxetine Hcl] Other (See Comments)    Headaches, constipation   Duloxetine Other (See Comments)    HEADACHES CONSTIPATION   Lamotrigine Rash   Other Other (See Comments)    OTOBIOTIC > RED EYES   Pregabalin Rash    Itchy red rash on chest   Prozac [Fluoxetine] Other (See Comments)    CONSTIPATION   Zolpidem Rash    "sleep walk"    Past Medical History:  Diagnosis Date   Allergic rhinitis    Anxiety    Arthritis    Cancer (HCC)    skin   Chronic pain    Chronically dry eyes    Depression    Fibromyalgia    GERD (gastroesophageal reflux disease)    Headache    Vitamin D deficiency    Wears glasses     Past Medical History, Surgical history, Social history, and Family history were reviewed and updated as appropriate.   Please see review of systems for further details on the patient's review from today.   Objective:   Physical Exam:  There were no vitals taken for this visit.  Physical Exam Constitutional:      General: She is not in acute distress. Musculoskeletal:        General: No deformity.  Neurological:     Mental Status: She is alert and oriented to person, place, and time.     Coordination: Coordination normal.  Psychiatric:        Attention and Perception: Attention and perception normal. She does not perceive auditory or visual hallucinations.        Mood and Affect: Mood normal. Mood is not anxious or depressed. Affect is not  labile, blunt, angry or inappropriate.        Speech: Speech normal.        Behavior: Behavior normal.        Thought Content: Thought content normal. Thought content is not paranoid or delusional. Thought content does not include homicidal or suicidal ideation. Thought content does not include homicidal or suicidal plan.        Cognition and Memory: Cognition and memory normal.        Judgment: Judgment normal.     Comments: Insight intact    Lab Review:     Component Value Date/Time   NA 141 09/10/2020 0918   K 3.9 09/10/2020 0918   CL 108 09/10/2020 1749  CO2 26 09/10/2020 0918   GLUCOSE 85 09/10/2020 0918   BUN 9 09/10/2020 0918   CREATININE 0.76 09/10/2020 0918   CALCIUM 9.6 09/10/2020 0918   PROT 7.5 09/10/2020 0918   ALBUMIN 4.1 09/10/2020 0918   AST 31 09/10/2020 0918   ALT 28 09/10/2020 0918   ALKPHOS 68 09/10/2020 0918   BILITOT 0.6 09/10/2020 0918   GFRNONAA >60 09/10/2020 0918   GFRAA >60 06/26/2019 1008       Component Value Date/Time   WBC 6.8 09/10/2020 0918   RBC 4.44 09/10/2020 0918   HGB 13.7 09/10/2020 0918   HCT 42.9 09/10/2020 0918   PLT 278 09/10/2020 0918   MCV 96.6 09/10/2020 0918   MCH 30.9 09/10/2020 0918   MCHC 31.9 09/10/2020 0918   RDW 13.0 09/10/2020 0918   LYMPHSABS 2.0 09/10/2020 0918   MONOABS 0.3 09/10/2020 0918   EOSABS 0.1 09/10/2020 0918   BASOSABS 0.0 09/10/2020 0918    No results found for: POCLITH, LITHIUM   No results found for: PHENYTOIN, PHENOBARB, VALPROATE, CBMZ   .res Assessment: Plan:    Pt seen for 30 minutes and time spent discussing decreasing and discontinuing Remeron due to side effects. Discussed that Remeron can be stopped at the 15 mg dose, however would recommend reducing to 7.5 mg po QHS for one week before stopping due to her h/o severe discontinuation s/s. Discussed that Remeron is also available in a 7.5 mg tab that could be split if she needed to have dose titrated more gradually.  Will consider  re-starting Lunesta if she begins to have insomnia after discontinuation with Remeron.  Continue Pristiq 50 mg po qd for depression and anxiety. Pt to follow-up in 4-6 weeks or sooner if clinically indicated.  Patient advised to contact office with any questions, adverse effects, or acute worsening in signs and symptoms.     Janesia was seen today for follow-up.  Diagnoses and all orders for this visit:  PTSD (post-traumatic stress disorder) -     mirtazapine (REMERON) 15 MG tablet; Decrease to 1/2 tablet at bedtime for one week, then stop  Moderate recurrent major depression (HCC) -     mirtazapine (REMERON) 15 MG tablet; Decrease to 1/2 tablet at bedtime for one week, then stop -     desvenlafaxine (PRISTIQ) 50 MG 24 hr tablet; Take 1 tablet (50 mg total) by mouth daily.    Please see After Visit Summary for patient specific instructions.  Future Appointments  Date Time Provider [place]  12/24/2020 11:00 AM Raulkar, Clide Deutscher, MD [facility] CPR  01/20/2021  1:15 PM Thayer Headings, PMHNP [facility] None  03/13/2021  9:00 AM Edgardo Roys, PsyD [facility] CPR  03/26/2021 10:10 AM Pieter Partridge, DO [facility] None    No orders of the defined types were placed in this encounter.   -------------------------------

## 2020-12-19 DIAGNOSIS — Z03818 Encounter for observation for suspected exposure to other biological agents ruled out: Secondary | ICD-10-CM | POA: Diagnosis not present

## 2020-12-19 DIAGNOSIS — R6889 Other general symptoms and signs: Secondary | ICD-10-CM | POA: Diagnosis not present

## 2020-12-19 DIAGNOSIS — R110 Nausea: Secondary | ICD-10-CM | POA: Diagnosis not present

## 2020-12-19 DIAGNOSIS — R6883 Chills (without fever): Secondary | ICD-10-CM | POA: Diagnosis not present

## 2020-12-24 ENCOUNTER — Other Ambulatory Visit: Payer: Self-pay

## 2020-12-24 ENCOUNTER — Encounter: Payer: PPO | Attending: Physical Medicine and Rehabilitation | Admitting: Physical Medicine and Rehabilitation

## 2020-12-24 VITALS — BP 148/83 | HR 67 | Temp 98.1°F | Ht 64.0 in | Wt 137.0 lb

## 2020-12-24 DIAGNOSIS — M7918 Myalgia, other site: Secondary | ICD-10-CM | POA: Diagnosis not present

## 2020-12-24 DIAGNOSIS — E559 Vitamin D deficiency, unspecified: Secondary | ICD-10-CM | POA: Diagnosis not present

## 2020-12-24 DIAGNOSIS — E039 Hypothyroidism, unspecified: Secondary | ICD-10-CM | POA: Insufficient documentation

## 2020-12-24 MED ORDER — LEVOTHYROXINE SODIUM 50 MCG PO TABS: 50.0000 ug | ORAL_TABLET | Freq: Every day | ORAL | 3 refills | Status: DC

## 2020-12-24 NOTE — Progress Notes (Signed)
Subjective:    Patient ID: Sharon Logan, female    DOB: 1956/04/10, 65 y.o.   MRN: [id number]  HPI  Sharon Logan is a 65 year old woman who presents for follow-up of cervical myofascial pain syndrome, intolerance to cold.   -Muscles feel like dead weight- both arms and legs.   -going on for 4 years.  -found her husband's body- her husband had throat cancer and was doing radiation and chemo at the same time and suffered a heart attack. This was 14 years ago and may have been a trigger.  -she worked at a [employer] and had to do a lot of lifting.   -she has been given mirtazepine as she also has anxiety   -she has been given hydrocodone and she feels she has built up a tolerance  -she has never tried Topamax.   -she gets cold really fast and hurts twice as bad. She has even been getting cold in the summer and this is intolerable to her  -she loves to read  -she has tried a TENS unit before with benefit while she used it  -continues to have a lot of cervical myofascial pain and is ready to try trigger point injections today. She cannot recall if she has tried these before.   Pain Inventory Average Pain 5 Pain Right Now 4 My pain is sharp, burning, stabbing, tingling and aching  In the last 24 hours, has pain interfered with the following? General activity 5 Relation with others 5 Enjoyment of life 5 What TIME of day is your pain at its worst? evening Sleep (in general) Poor  Pain is worse with: walking, bending and some activites Pain improves with: rest, heat/ice, therapy/exercise and injections Relief from Meds: 4  walk without assistance how many minutes can you walk? 40 ability to climb steps?  yes do you drive?  yes  disabled: date disabled Jan 2013 retired  numbness depression anxiety  New pt  New pt    Family History  Problem Relation Age of Onset   Depression Mother    Heart attack Mother    COPD Mother    Depression Father    Heart  attack Father    ADD / ADHD Son    Social History   Socioeconomic History   Marital status: Widowed    Spouse name: Not on file   Number of children: Not on file   Years of education: Not on file   Highest education level: Not on file  Occupational History   Not on file  Tobacco Use   Smoking status: Never   Smokeless tobacco: Never  Vaping Use   Vaping Use: Never used  Substance and Sexual Activity   Alcohol use: No   Drug use: No   Sexual activity: Not on file  Other Topics Concern   Not on file  Social History Narrative   Right handed   Lives in a one story home   Drinks Tea   Social Determinants of Health   Financial Resource Strain: Not on file  Food Insecurity: Not on file  Transportation Needs: Not on file  Physical Activity: Not on file  Stress: Not on file  Social Connections: Not on file   Past Surgical History:  Procedure Laterality Date   ABDOMINAL HYSTERECTOMY     BACK SURGERY     BUNIONECTOMY Bilateral    EYE SURGERY     laser surgery   NASAL SINUS SURGERY  x2   SACROILIAC JOINT FUSION Bilateral 09/12/2020   Procedure: REMOVAL OF BILATERAL PELVIC SCREWS;  Surgeon: Phylliss Bob, MD;  Location: [facility];  Service: Orthopedics;  Laterality: Bilateral;   SKIN CANCER EXCISION     face x3   TUBAL LIGATION     Past Medical History:  Diagnosis Date   Allergic rhinitis    Anxiety    Arthritis    Cancer (HCC)    skin   Chronic pain    Chronically dry eyes    Depression    Fibromyalgia    GERD (gastroesophageal reflux disease)    Headache    Vitamin D deficiency    Wears glasses    BP (!) 148/83 (BP Location: Right Arm)   Pulse 67   Temp 98.1 F (36.7 C) (Oral)   Ht 5\' 4"  (1.626 m)   Wt 137 lb (62.1 kg)   SpO2 96%   BMI 23.52 kg/m   Opioid Risk Score:   Fall Risk Score:  `1  Depression screen PHQ 2/9  Depression screen [facility] 2/9 12/24/2020 10/08/2020  Decreased Interest 1 1  Down, Depressed, Hopeless 1 1  PHQ - 2 Score 2 2   Altered sleeping - 2  Tired, decreased energy - 2  Change in appetite - 2  Feeling bad or failure about yourself  - 0  Trouble concentrating - 0  Moving slowly or fidgety/restless - 0  Suicidal thoughts - 0  PHQ-9 Score - 8  Difficult doing work/chores - Not difficult at all    Review of Systems  Musculoskeletal:  Positive for myalgias.       Arm, leg, foot pain  Neurological:  Positive for numbness.  Psychiatric/Behavioral:  Positive for dysphoric mood. The patient is nervous/anxious.   All other systems reviewed and are negative.     Objective:   Physical Exam  Gen: no distress, normal appearing HEENT: oral mucosa pink and moist, NCAT Cardio: Reg rate Chest: normal effort, normal rate of breathing Abd: soft, non-distended Ext: no edema Psych: pleasant, normal affect Skin: intact Neuro: Alert and oriented x3 Musculoskeletal: Diffuse pain and tenderness in upper and lower extremities. +trigger point injections in bilateral trapezius muscles     Assessment & Plan:  Sharon Logan is a 65 year old woman who presents to establish care for fibromyalgia:  Chronic Pain Syndrome secondary to fibromyalgia -Discussed current symptoms of pain and history of pain.  -Discussed benefits of exercise in reducing pain. -start topamax 25mg  HS, can uptitrate in 3 days if well tolerated.  -Discussed following foods that may reduce pain: 1) Ginger (especially studied for arthritis)- reduce leukotriene production to decrease inflammation 2) Blueberries- high in phytonutrients that decrease inflammation 3) Salmon- marine omega-3s reduce joint swelling and pain 4) Pumpkin seeds- reduce inflammation 5) dark chocolate- reduces inflammation 6) turmeric- reduces inflammation 7) tart cherries - reduce pain and stiffness 8) extra virgin olive oil - its compound olecanthal helps to block prostaglandins  9) chili peppers- can be eaten or applied topically via capsaicin 10) mint- helpful for  headache, muscle aches, joint pain, and itching 11) garlic- reduces inflammation  Link to further information on diet for chronic pain: [email or web address].[email or web address]/  2. Insomnia: -Try to go outside near sunrise -Get exercise during the day.  -Discussed good sleep hygiene: turning off all devices an hour before bedtime.  -Chamomile tea with dinner.  -Can consider over the counter melatonin  2) Anxiety: -Discussed exercise and meditation as tools to decrease anxiety. -Recommended Down  Dog Yoga app -Discussed spending time outdoors. -Discussed positive re-framing of anxiety.  -Discussed the following foods that have been show to reduce anxiety: 1) [place] nuts, mushrooms, soy beans due to their high selenium content. Upper limit of toxicity of selenium is 467mcg/day so no more than 3-4 [place] nuts per day.  2) Fatty fish such as salmon, mackerel, sardines, trout, and herring- high in omega-3 fatty acids 3) Eggs- increases serotonin and dopamine 4) Pumpkin seeds- high in omega-3 fatty acids 5) dark chocolate- high in flavanols that increase blood flow to brain 6) turmeric- take with black pepper to increase absorption 7) chamomile tea- antioxidant and anti-inflammatory properties 8) yogurt without sugar- supports gut-brain axis 9) green tea- contains L- theanine 10) blueberries- high in vitamin C and antioxidants 11) [place]- high in tryptophan which gets converted to serotonin 12) bell peppers- rich in vitamin C and antioxidants 13) citrus fruits- rich in vitamin C and antioxidants 14) almonds- high in vitamin E and healthy fats 15) chia seeds- high in omega-3 fatty acids  3) Cervical myofascial pain syndrome Trigger Point Injection  Indication: Cervical myofascial pain not relieved by medication management and other conservative care.  Informed consent was obtained after describing risk and benefits of the procedure  with the patient, this includes bleeding, bruising, infection and medication side effects.  The patient wishes to proceed and has given written consent.  The patient was placed in a seated position.  The cervical area was marked and prepped with Betadine.  It was entered with a 25-gauge 1/2 inch needle and a total of 5 mL of 1% lidocaine was injected into a total of 4 trigger points, after negative draw back for blood.  The patient tolerated the procedure well.  Post procedure instructions were given.    -advised heat to relax muscles -can repeat in 3 months if needed -call me to repeat earlier if needed -discussed benefits of TENs unit, can try for her if trigger point injections not providing enough relief.  4) Cold intolerance -very bothersome to her -present even in summer -thyroid low end of norma -prescribed levothyroxine 22mcg to see if this helps, advised her to take 30-60 minutes before breakfast and to please let me know  5) Low vitamin D -continue current supplementation -advised regarding the benefits of high dose supplementation, can consider if symptoms fail to improve with above regimen.

## 2020-12-25 DIAGNOSIS — M8580 Other specified disorders of bone density and structure, unspecified site: Secondary | ICD-10-CM | POA: Diagnosis not present

## 2020-12-25 DIAGNOSIS — D508 Other iron deficiency anemias: Secondary | ICD-10-CM | POA: Diagnosis not present

## 2020-12-25 DIAGNOSIS — M19079 Primary osteoarthritis, unspecified ankle and foot: Secondary | ICD-10-CM | POA: Diagnosis not present

## 2020-12-25 DIAGNOSIS — E7800 Pure hypercholesterolemia, unspecified: Secondary | ICD-10-CM | POA: Diagnosis not present

## 2020-12-25 DIAGNOSIS — G43009 Migraine without aura, not intractable, without status migrainosus: Secondary | ICD-10-CM | POA: Diagnosis not present

## 2020-12-25 DIAGNOSIS — G4700 Insomnia, unspecified: Secondary | ICD-10-CM | POA: Diagnosis not present

## 2020-12-25 DIAGNOSIS — F3341 Major depressive disorder, recurrent, in partial remission: Secondary | ICD-10-CM | POA: Diagnosis not present

## 2020-12-26 DIAGNOSIS — M810 Age-related osteoporosis without current pathological fracture: Secondary | ICD-10-CM | POA: Diagnosis not present

## 2021-01-10 ENCOUNTER — Telehealth: Payer: Self-pay | Admitting: Psychiatry

## 2021-01-10 DIAGNOSIS — M85851 Other specified disorders of bone density and structure, right thigh: Secondary | ICD-10-CM | POA: Diagnosis not present

## 2021-01-10 DIAGNOSIS — M48061 Spinal stenosis, lumbar region without neurogenic claudication: Secondary | ICD-10-CM | POA: Diagnosis not present

## 2021-01-10 DIAGNOSIS — Z79899 Other long term (current) drug therapy: Secondary | ICD-10-CM | POA: Diagnosis not present

## 2021-01-10 DIAGNOSIS — F4310 Post-traumatic stress disorder, unspecified: Secondary | ICD-10-CM | POA: Diagnosis not present

## 2021-01-10 DIAGNOSIS — R030 Elevated blood-pressure reading, without diagnosis of hypertension: Secondary | ICD-10-CM | POA: Diagnosis not present

## 2021-01-10 DIAGNOSIS — M797 Fibromyalgia: Secondary | ICD-10-CM | POA: Diagnosis not present

## 2021-01-10 NOTE — Telephone Encounter (Signed)
LVM with info

## 2021-01-10 NOTE — Telephone Encounter (Signed)
Received note from pt that her therapist suggested Wellbutrin to help with energy and she was wondering how to come off Prisitiq. Please contact pt and let her know that we can discuss this at her visit next week on 8/8.

## 2021-01-12 DIAGNOSIS — J0140 Acute pansinusitis, unspecified: Secondary | ICD-10-CM | POA: Diagnosis not present

## 2021-01-14 ENCOUNTER — Encounter: Payer: Self-pay | Admitting: Psychiatry

## 2021-01-14 ENCOUNTER — Ambulatory Visit (INDEPENDENT_AMBULATORY_CARE_PROVIDER_SITE_OTHER): Payer: PPO | Admitting: Psychiatry

## 2021-01-14 ENCOUNTER — Other Ambulatory Visit: Payer: Self-pay

## 2021-01-14 DIAGNOSIS — G4700 Insomnia, unspecified: Secondary | ICD-10-CM | POA: Diagnosis not present

## 2021-01-14 DIAGNOSIS — F3341 Major depressive disorder, recurrent, in partial remission: Secondary | ICD-10-CM

## 2021-01-14 DIAGNOSIS — J329 Chronic sinusitis, unspecified: Secondary | ICD-10-CM | POA: Diagnosis not present

## 2021-01-14 DIAGNOSIS — G43009 Migraine without aura, not intractable, without status migrainosus: Secondary | ICD-10-CM | POA: Diagnosis not present

## 2021-01-14 DIAGNOSIS — H5713 Ocular pain, bilateral: Secondary | ICD-10-CM | POA: Diagnosis not present

## 2021-01-14 MED ORDER — DOXEPIN HCL 6 MG PO TABS
ORAL_TABLET | ORAL | 2 refills | Status: DC
Start: 2021-01-14 — End: 2021-02-21

## 2021-01-14 NOTE — Progress Notes (Signed)
Sharon Logan DI:[id number] 25-Apr-1956 65 y.o.  Subjective:   Patient ID:  Sharon Logan is a 65 y.o. (DOB 04/26/1956) female.  Chief Complaint:  Chief Complaint  Patient presents with   Follow-up    H/o depression and anxiety    HPI Sharon Logan presents to the office today for follow-up of insomnia, depression, and anxiety. She has not taken Pristiq for the last 4 days after talking to therapist. She reports, "it's been easier to get up in the morning and I am not having the sense of dread." She reports that [place] may have been exacerbating depression. She reports that her mood is "fine. I don't feel depressed or anything." Energy has improved significantly. She reports that her energy is likely to not be as good as she would like without her pain improving. Sleep is interrupted due to pain. Reports some difficulty falling and staying asleep. -Denies anxiety- "I haven't really had any." Appetite has been ok. She describes her concentration as "50/50." Able to sustain concentration while reading. Denies SI.   Past Medication Trials: Lexapro- "My anti-depressant of choice" and gives her energy. Was taking 2-3 years prior to surgery. Re-started one week ago. Has taken 10 mg and 20 mg doses in the past. Thinks it may have helped with pain in the past. Cymbalta- Started after back surgery. Has made her tired, even when reduced to 30 mg. Was having to take 2 hour naps. May have helped slightly with pain. Had HA, Constipation, Nausea Savella- muscle aches, rhabdomyolysis Effexor-HA, helped with excessive sweating.  Pristiq- May have increased depression. Prozac- Constipation. Causes her to feel cold.  Celexa Lexapro Zoloft- worsening mood Paxil Wellbutrin- Did not cause HA's. Tolerated well and was effective for depression. Amitriptyline- Did not cause HA's. Weight gain. Effective.  Nortriptyline- Did not cause HA's. Effective. Lamictal- Rash Gabapentin -Rhabomyolysis Lyrica- Adverse  reaction Buspar Trazodone- helped with sleep initiation but sleep was not restful. Ambien- Parasomnia Lunesta- Tolerated, effective at 2 mg dose Remeron- Dry eyes, internal restlessness Clonidine Topamax- ineffective for headaches  PHQ2-9    [place] Office Visit from 12/24/2020 in [facility] Office Visit from 10/08/2020 in [facility]  PHQ-2 Total Score 2 2  PHQ-9 Total Score -- 8      Flowsheet Row Admission (Discharged) from 09/12/2020 in [facility] from 09/10/2020 in [facility] PREADMISSION TESTING  C-SSRS RISK CATEGORY No Risk No Risk        Review of Systems:  Review of Systems  Constitutional:  Positive for diaphoresis.  Endocrine: Positive for cold intolerance.  Musculoskeletal:  Positive for back pain and neck pain. Negative for gait problem.  Psychiatric/Behavioral:         Please refer to HPI   Medications: I have reviewed the patient's current medications.  Current Outpatient Medications  Medication Sig Dispense Refill   b complex vitamins capsule Take 1 capsule by mouth daily.     baclofen (LIORESAL) 10 MG tablet Take 10 mg by mouth every 8 (eight) hours as needed for muscle spasms.     Carboxymethylcellulose Sodium (THERATEARS OP) Apply 1 drop to eye 4 (four) times daily as needed (dry eyes).     cholecalciferol (VITAMIN D) 25 MCG (1000 UNIT) tablet Take 2,000 Units by mouth in the morning.     Doxepin HCl 6 MG TABS Take 1/2-1 tab po QHS prn insomnia 30 tablet 2  Erenumab-aooe (AIMOVIG) 70 MG/ML SOAJ Inject 70 mg into the skin every 28 (twenty-eight) days. 1.12 mL 5   fluticasone (FLONASE) 50 MCG/ACT nasal spray Place 2 sprays into both nostrils 2 (two) times daily as needed for allergies or rhinitis.     HYDROcodone-acetaminophen (NORCO) 10-325 MG tablet Take 1 tablet by mouth 2 (two) times daily as needed.      levocetirizine (XYZAL) 5 MG tablet Take 5 mg by mouth at bedtime as needed for allergies.     levothyroxine (SYNTHROID) 50 MCG tablet Take 1 tablet (50 mcg total) by mouth daily before breakfast. 30 tablet 3   nitroGLYCERIN (NITRO-DUR) 0.2 mg/hr patch Place 1 patch (0.2 mg total) onto the skin daily. 30 patch 2   rizatriptan (MAXALT) 10 MG tablet Take 10 mg by mouth daily as needed for migraine.      topiramate (TOPAMAX) 25 MG tablet Take 1 tablet (25 mg total) by mouth at bedtime. (Patient not taking: Reported on 01/14/2021) 30 tablet 2   No current facility-administered medications for this visit.    Medication Side Effects: Other: N/A  Allergies:  Allergies  Allergen Reactions   Gabapentin Other (See Comments)   Polymyxin B Other (See Comments)    Eyes go blood red   Cymbalta [Duloxetine Hcl] Other (See Comments)    Headaches, constipation   Duloxetine Other (See Comments)    HEADACHES CONSTIPATION   Lamotrigine Rash   Other Other (See Comments)    OTOBIOTIC > RED EYES   Pregabalin Rash    Itchy red rash on chest   Prozac [Fluoxetine] Other (See Comments)    CONSTIPATION   Zolpidem Rash    "sleep walk"    Past Medical History:  Diagnosis Date   Allergic rhinitis    Anxiety    Arthritis    Cancer (HCC)    skin   Chronic pain    Chronically dry eyes    Depression    Fibromyalgia    GERD (gastroesophageal reflux disease)    Headache    Osteoporosis    Vitamin D deficiency    Wears glasses     Past Medical History, Surgical history, Social history, and Family history were reviewed and updated as appropriate.   Please see review of systems for further details on the patient's review from today.   Objective:   Physical Exam:  There were no vitals taken for this visit.  Physical Exam Constitutional:      General: She is not in acute distress. Musculoskeletal:        General: No deformity.  Neurological:     Mental Status: She is alert and oriented to  person, place, and time.     Coordination: Coordination normal.  Psychiatric:        Attention and Perception: Attention and perception normal. She does not perceive auditory or visual hallucinations.        Mood and Affect: Mood normal. Mood is not anxious or depressed. Affect is not labile, blunt, angry or inappropriate.        Speech: Speech normal.        Behavior: Behavior normal.        Thought Content: Thought content normal. Thought content is not paranoid or delusional. Thought content does not include homicidal or suicidal ideation. Thought content does not include homicidal or suicidal plan.        Cognition and Memory: Cognition and memory normal.        Judgment: Judgment normal.  Comments: Insight intact    Lab Review:     Component Value Date/Time   NA 141 09/10/2020 0918   K 3.9 09/10/2020 0918   CL 108 09/10/2020 0918   CO2 26 09/10/2020 0918   GLUCOSE 85 09/10/2020 0918   BUN 9 09/10/2020 0918   CREATININE 0.76 09/10/2020 0918   CALCIUM 9.6 09/10/2020 0918   PROT 7.5 09/10/2020 0918   ALBUMIN 4.1 09/10/2020 0918   AST 31 09/10/2020 0918   ALT 28 09/10/2020 0918   ALKPHOS 68 09/10/2020 0918   BILITOT 0.6 09/10/2020 0918   GFRNONAA >60 09/10/2020 0918   GFRAA >60 06/26/2019 1008       Component Value Date/Time   WBC 6.8 09/10/2020 0918   RBC 4.44 09/10/2020 0918   HGB 13.7 09/10/2020 0918   HCT 42.9 09/10/2020 0918   PLT 278 09/10/2020 0918   MCV 96.6 09/10/2020 0918   MCH 30.9 09/10/2020 0918   MCHC 31.9 09/10/2020 0918   RDW 13.0 09/10/2020 0918   LYMPHSABS 2.0 09/10/2020 0918   MONOABS 0.3 09/10/2020 0918   EOSABS 0.1 09/10/2020 0918   BASOSABS 0.0 09/10/2020 0918    No results found for: POCLITH, LITHIUM   No results found for: PHENYTOIN, PHENOBARB, VALPROATE, CBMZ   .res Assessment: Plan:   Pt seen for 30 minutes and time spent discussing treatment plan. She reports that she has been feeling less depressed and less tired since stopping  Pristiq. She currently denies any anxiety or depressive s/s at this time and prefers not to start a medication for depression or anxiety at this time.  She reports that she has had good results with Wellbutrin XL 150 mg daily in the past and will consider re-starting Wellbutrin if depression recurs.  Discussed potential benefits, risks, and side effects of Doxepin for insomnia.  Discussed continuing therapy with [name], [facility].  Pt to follow-up in 3 months or sooner if clinically indicated.  Patient advised to contact office with any questions, adverse effects, or acute worsening in signs and symptoms.   [place] was seen today for follow-up.  Diagnoses and all orders for this visit:  Insomnia, unspecified type -     Doxepin HCl 6 MG TABS; Take 1/2-1 tab po QHS prn insomnia  Recurrent major depressive disorder, in partial remission ([place])    Please see After Visit Summary for patient specific instructions.  Future Appointments  Date Time Provider [place]  01/16/2021 10:45 AM Trula Slade, DPM [facility]  03/13/2021  9:00 AM Edgardo Roys, PsyD [facility]  03/25/2021 11:20 AM Ranell Patrick, Clide Deutscher, MD [facility]  03/26/2021 10:10 AM Pieter Partridge, DO [facility] None  04/15/2021 11:30 AM Thayer Headings, PMHNP [facility] None    No orders of the defined types were placed in this encounter.   -------------------------------

## 2021-01-16 ENCOUNTER — Other Ambulatory Visit: Payer: Self-pay

## 2021-01-16 ENCOUNTER — Ambulatory Visit (INDEPENDENT_AMBULATORY_CARE_PROVIDER_SITE_OTHER): Payer: PPO | Admitting: Podiatry

## 2021-01-16 DIAGNOSIS — R6889 Other general symptoms and signs: Secondary | ICD-10-CM | POA: Diagnosis not present

## 2021-01-16 DIAGNOSIS — M2142 Flat foot [pes planus] (acquired), left foot: Secondary | ICD-10-CM | POA: Diagnosis not present

## 2021-01-16 DIAGNOSIS — M779 Enthesopathy, unspecified: Secondary | ICD-10-CM

## 2021-01-16 DIAGNOSIS — M722 Plantar fascial fibromatosis: Secondary | ICD-10-CM | POA: Diagnosis not present

## 2021-01-16 DIAGNOSIS — M21961 Unspecified acquired deformity of right lower leg: Secondary | ICD-10-CM | POA: Diagnosis not present

## 2021-01-16 DIAGNOSIS — M797 Fibromyalgia: Secondary | ICD-10-CM | POA: Diagnosis not present

## 2021-01-16 DIAGNOSIS — M2141 Flat foot [pes planus] (acquired), right foot: Secondary | ICD-10-CM

## 2021-01-16 DIAGNOSIS — M8580 Other specified disorders of bone density and structure, unspecified site: Secondary | ICD-10-CM | POA: Diagnosis not present

## 2021-01-16 NOTE — Patient Instructions (Signed)
For instructions on how to put on your Night Splint, please visit [email or web address]  You can also try "theraworx" for the cramping   Plantar Fasciitis (Heel Spur Syndrome) with Rehab The plantar fascia is a fibrous, ligament-like, soft-tissue structure that spans the bottom of the foot. Plantar fasciitis is a condition that causes pain in the foot due to inflammation of the tissue. SYMPTOMS  Pain and tenderness on the underneath side of the foot. Pain that worsens with standing or walking. CAUSES  Plantar fasciitis is caused by irritation and injury to the plantar fascia on the underneath side of the foot. Common mechanisms of injury include: Direct trauma to bottom of the foot. Damage to a small nerve that runs under the foot where the main fascia attaches to the heel bone. Stress placed on the plantar fascia due to bone spurs. RISK INCREASES WITH:  Activities that place stress on the plantar fascia (running, jumping, pivoting, or cutting). Poor strength and flexibility. Improperly fitted shoes. Tight calf muscles. Flat feet. Failure to warm-up properly before activity. Obesity. PREVENTION Warm up and stretch properly before activity. Allow for adequate recovery between workouts. Maintain physical fitness: Strength, flexibility, and endurance. Cardiovascular fitness. Maintain a health body weight. Avoid stress on the plantar fascia. Wear properly fitted shoes, including arch supports for individuals who have flat feet.  PROGNOSIS  If treated properly, then the symptoms of plantar fasciitis usually resolve without surgery. However, occasionally surgery is necessary.  RELATED COMPLICATIONS  Recurrent symptoms that may result in a chronic condition. Problems of the lower back that are caused by compensating for the injury, such as limping. Pain or weakness of the foot during push-off following surgery. Chronic inflammation, scarring, and partial or complete fascia tear,  occurring more often from repeated injections.  TREATMENT  Treatment initially involves the use of ice and medication to help reduce pain and inflammation. The use of strengthening and stretching exercises may help reduce pain with activity, especially stretches of the Achilles tendon. These exercises may be performed at home or with a therapist. Your caregiver may recommend that you use heel cups of arch supports to help reduce stress on the plantar fascia. Occasionally, corticosteroid injections are given to reduce inflammation. If symptoms persist for greater than 6 months despite non-surgical (conservative), then surgery may be recommended.   MEDICATION  If pain medication is necessary, then nonsteroidal anti-inflammatory medications, such as aspirin and ibuprofen, or other minor pain relievers, such as acetaminophen, are often recommended. Do not take pain medication within 7 days before surgery. Prescription pain relievers may be given if deemed necessary by your caregiver. Use only as directed and only as much as you need. Corticosteroid injections may be given by your caregiver. These injections should be reserved for the most serious cases, because they may only be given a certain number of times.  HEAT AND COLD Cold treatment (icing) relieves pain and reduces inflammation. Cold treatment should be applied for 10 to 15 minutes every 2 to 3 hours for inflammation and pain and immediately after any activity that aggravates your symptoms. Use ice packs or massage the area with a piece of ice (ice massage). Heat treatment may be used prior to performing the stretching and strengthening activities prescribed by your caregiver, physical therapist, or athletic trainer. Use a heat pack or soak the injury in warm water.  SEEK IMMEDIATE MEDICAL CARE IF: Treatment seems to offer no benefit, or the condition worsens. Any medications produce adverse side effects.  EXERCISES- RANGE OF  MOTION (ROM) AND  STRETCHING EXERCISES - Plantar Fasciitis (Heel Spur Syndrome) These exercises may help you when beginning to rehabilitate your injury. Your symptoms may resolve with or without further involvement from your physician, physical therapist or athletic trainer. While completing these exercises, remember:  Restoring tissue flexibility helps normal motion to return to the joints. This allows healthier, less painful movement and activity. An effective stretch should be held for at least 30 seconds. A stretch should never be painful. You should only feel a gentle lengthening or release in the stretched tissue.  RANGE OF MOTION - Toe Extension, Flexion Sit with your right / left leg crossed over your opposite knee. Grasp your toes and gently pull them back toward the top of your foot. You should feel a stretch on the bottom of your toes and/or foot. Hold this stretch for 10 seconds. Now, gently pull your toes toward the bottom of your foot. You should feel a stretch on the top of your toes and or foot. Hold this stretch for 10 seconds. Repeat  times. Complete this stretch 3 times per day.   RANGE OF MOTION - Ankle Dorsiflexion, Active Assisted Remove shoes and sit on a chair that is preferably not on a carpeted surface. Place right / left foot under knee. Extend your opposite leg for support. Keeping your heel down, slide your right / left foot back toward the chair until you feel a stretch at your ankle or calf. If you do not feel a stretch, slide your bottom forward to the edge of the chair, while still keeping your heel down. Hold this stretch for 10 seconds. Repeat 3 times. Complete this stretch 2 times per day.   STRETCH  Gastroc, Standing Place hands on wall. Extend right / left leg, keeping the front knee somewhat bent. Slightly point your toes inward on your back foot. Keeping your right / left heel on the floor and your knee straight, shift your weight toward the wall, not allowing your back  to arch. You should feel a gentle stretch in the right / left calf. Hold this position for 10 seconds. Repeat 3 times. Complete this stretch 2 times per day.  STRETCH  Soleus, Standing Place hands on wall. Extend right / left leg, keeping the other knee somewhat bent. Slightly point your toes inward on your back foot. Keep your right / left heel on the floor, bend your back knee, and slightly shift your weight over the back leg so that you feel a gentle stretch deep in your back calf. Hold this position for 10 seconds. Repeat 3 times. Complete this stretch 2 times per day.  STRETCH  Gastrocsoleus, Standing  Note: This exercise can place a lot of stress on your foot and ankle. Please complete this exercise only if specifically instructed by your caregiver.  Place the ball of your right / left foot on a step, keeping your other foot firmly on the same step. Hold on to the wall or a rail for balance. Slowly lift your other foot, allowing your body weight to press your heel down over the edge of the step. You should feel a stretch in your right / left calf. Hold this position for 10 seconds. Repeat this exercise with a slight bend in your right / left knee. Repeat 3 times. Complete this stretch 2 times per day.   STRENGTHENING EXERCISES - Plantar Fasciitis (Heel Spur Syndrome)  These exercises may help you when beginning to rehabilitate your injury. They may  resolve your symptoms with or without further involvement from your physician, physical therapist or athletic trainer. While completing these exercises, remember:  Muscles can gain both the endurance and the strength needed for everyday activities through controlled exercises. Complete these exercises as instructed by your physician, physical therapist or athletic trainer. Progress the resistance and repetitions only as guided.  STRENGTH - Towel Curls Sit in a chair positioned on a non-carpeted surface. Place your foot on a towel,  keeping your heel on the floor. Pull the towel toward your heel by only curling your toes. Keep your heel on the floor. Repeat 3 times. Complete this exercise 2 times per day.  STRENGTH - Ankle Inversion Secure one end of a rubber exercise band/tubing to a fixed object (table, pole). Loop the other end around your foot just before your toes. Place your fists between your knees. This will focus your strengthening at your ankle. Slowly, pull your big toe up and in, making sure the band/tubing is positioned to resist the entire motion. Hold this position for 10 seconds. Have your muscles resist the band/tubing as it slowly pulls your foot back to the starting position. Repeat 3 times. Complete this exercises 2 times per day.  Document Released: 06/01/2005 Document Revised: 08/24/2011 Document Reviewed: 09/13/2008 [facility] Patient Information 2014 [place], [place].

## 2021-01-20 ENCOUNTER — Ambulatory Visit: Admitting: Psychiatry

## 2021-01-21 NOTE — Progress Notes (Signed)
Subjective: 65 year old female presents the office today for follow evaluation of right foot pain.  She said the plan fasciitis symptoms of worsening the arch of her foot.  Remaining cramping the entire foot.  She does feel that this is worsened ever since the implant was placed into the joint.  She gets pain in the morning when she first gets up in the arch.  No recent injury or trauma or any increase in swelling or redness.  She has no other concerns.   Objective: AAO x3, NAD DP/PT pulses palpable bilaterally, CRT less than 3 seconds Majority tenderness along medial band plantar fascial along the arch of the foot.  She does get discomfort along the first MPJ as well as second MPJ.  No edema no erythema.  No Tinel sign.  No other areas of discomfort.  MMT 5/5. No pain with calf compression, swelling, warmth, erythema  Assessment: Right foot pain, capsulitis; plantar fasciitis  Plan: -All treatment options discussed with the patient including all alternatives, risks, complications.  -Reported as she has we discussed stretching, icing daily.  Given the morning discomfort I recommended and dispensed a night splint.  Discussed shoe modifications and arch supports.  We discussed that future surgical intervention.  We discussed with her first MPJ arthrodesis with removal of the implant.  She is to consider this and at next appointment we will further discuss this as well as possible surgical consent.  Trula Slade DPM

## 2021-02-03 ENCOUNTER — Other Ambulatory Visit: Payer: Self-pay | Admitting: Orthopedic Surgery

## 2021-02-03 DIAGNOSIS — M533 Sacrococcygeal disorders, not elsewhere classified: Secondary | ICD-10-CM | POA: Diagnosis not present

## 2021-02-03 DIAGNOSIS — G8929 Other chronic pain: Secondary | ICD-10-CM

## 2021-02-05 DIAGNOSIS — Z5181 Encounter for therapeutic drug level monitoring: Secondary | ICD-10-CM | POA: Diagnosis not present

## 2021-02-05 DIAGNOSIS — G894 Chronic pain syndrome: Secondary | ICD-10-CM | POA: Diagnosis not present

## 2021-02-05 DIAGNOSIS — M5136 Other intervertebral disc degeneration, lumbar region: Secondary | ICD-10-CM | POA: Diagnosis not present

## 2021-02-05 DIAGNOSIS — M961 Postlaminectomy syndrome, not elsewhere classified: Secondary | ICD-10-CM | POA: Diagnosis not present

## 2021-02-05 DIAGNOSIS — Z79899 Other long term (current) drug therapy: Secondary | ICD-10-CM | POA: Diagnosis not present

## 2021-02-06 DIAGNOSIS — M5442 Lumbago with sciatica, left side: Secondary | ICD-10-CM | POA: Diagnosis not present

## 2021-02-06 DIAGNOSIS — M47816 Spondylosis without myelopathy or radiculopathy, lumbar region: Secondary | ICD-10-CM | POA: Diagnosis not present

## 2021-02-06 DIAGNOSIS — M545 Low back pain, unspecified: Secondary | ICD-10-CM | POA: Diagnosis not present

## 2021-02-06 DIAGNOSIS — M5441 Lumbago with sciatica, right side: Secondary | ICD-10-CM | POA: Diagnosis not present

## 2021-02-10 DIAGNOSIS — L821 Other seborrheic keratosis: Secondary | ICD-10-CM | POA: Diagnosis not present

## 2021-02-10 DIAGNOSIS — D485 Neoplasm of uncertain behavior of skin: Secondary | ICD-10-CM | POA: Diagnosis not present

## 2021-02-10 DIAGNOSIS — M48061 Spinal stenosis, lumbar region without neurogenic claudication: Secondary | ICD-10-CM | POA: Diagnosis not present

## 2021-02-10 DIAGNOSIS — M797 Fibromyalgia: Secondary | ICD-10-CM | POA: Diagnosis not present

## 2021-02-10 DIAGNOSIS — M85851 Other specified disorders of bone density and structure, right thigh: Secondary | ICD-10-CM | POA: Diagnosis not present

## 2021-02-10 DIAGNOSIS — Z79899 Other long term (current) drug therapy: Secondary | ICD-10-CM | POA: Diagnosis not present

## 2021-02-10 DIAGNOSIS — H61002 Unspecified perichondritis of left external ear: Secondary | ICD-10-CM | POA: Diagnosis not present

## 2021-02-12 DIAGNOSIS — M19011 Primary osteoarthritis, right shoulder: Secondary | ICD-10-CM | POA: Diagnosis not present

## 2021-02-19 ENCOUNTER — Other Ambulatory Visit: Payer: Self-pay

## 2021-02-19 MED ORDER — AIMOVIG 70 MG/ML ~~LOC~~ SOAJ: 70.0000 mg | SUBCUTANEOUS | 0 refills | Status: DC

## 2021-02-19 NOTE — Telephone Encounter (Signed)
Refill request received from [facility] 70 mg #1 0 refills pt schedule for f/I visit 03/2021

## 2021-02-21 ENCOUNTER — Telehealth: Payer: Self-pay | Admitting: Psychiatry

## 2021-02-21 DIAGNOSIS — G4700 Insomnia, unspecified: Secondary | ICD-10-CM

## 2021-02-21 DIAGNOSIS — F331 Major depressive disorder, recurrent, moderate: Secondary | ICD-10-CM

## 2021-02-21 MED ORDER — DOXEPIN HCL 10 MG PO CAPS: 10.0000 mg | ORAL_CAPSULE | Freq: Every evening | ORAL | 1 refills | Status: DC | PRN

## 2021-02-21 MED ORDER — BUPROPION HCL ER (XL) 150 MG PO TB24: 150.0000 mg | ORAL_TABLET | Freq: Every day | ORAL | 1 refills | Status: DC

## 2021-02-21 NOTE — Telephone Encounter (Signed)
Please relay below information to patient and advise her to contact office if sleep does not improve with increased dose in doxepin.

## 2021-02-21 NOTE — Telephone Encounter (Signed)
LVM with info

## 2021-02-21 NOTE — Telephone Encounter (Signed)
Received written note from patient dictating for "it is time to try Wellbutrin.  The doxepin does not work now.  The pain wakes me up twice a night.  Is there something similar I can try to help me sleep?"  Will send script for Wellbutrin XL 150 mg po qd for depression. Will also send in script for Doxepin 10 mg po QHS for insomnia to determine if this dose may be more effective for insomnia since 6 mg is no longer as effective.

## 2021-02-24 ENCOUNTER — Ambulatory Visit
Admission: RE | Admit: 2021-02-24 | Discharge: 2021-02-24 | Disposition: A | Discharge status: Home / Self Care | Payer: PPO | Source: Ambulatory Visit | Attending: Orthopedic Surgery | Admitting: Orthopedic Surgery

## 2021-02-24 ENCOUNTER — Other Ambulatory Visit: Payer: PPO

## 2021-02-24 ENCOUNTER — Other Ambulatory Visit: Payer: Self-pay

## 2021-02-24 DIAGNOSIS — G8929 Other chronic pain: Secondary | ICD-10-CM

## 2021-02-24 DIAGNOSIS — M533 Sacrococcygeal disorders, not elsewhere classified: Secondary | ICD-10-CM

## 2021-02-24 DIAGNOSIS — M461 Sacroiliitis, not elsewhere classified: Secondary | ICD-10-CM | POA: Diagnosis not present

## 2021-02-24 IMAGING — CT CT BIOPSY
4 of 6 series · 14 of 32 positions shown, 19 images · non-contrast
Comparison: none

CLINICAL DATA: Bilateral sacroiliitis. Patient had positive
response to anesthetic injection eight months ago.

[Series 2: needle -guided injection · axial · 0.72mm/px · z∈[-154,-114]mm · 4 of 34 slices shown (1 of 4)]
[im 7/34  soft-tissue]
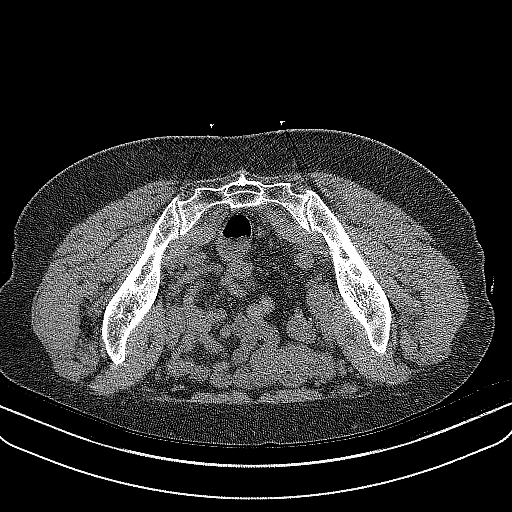
[im 14/34  soft-tissue]
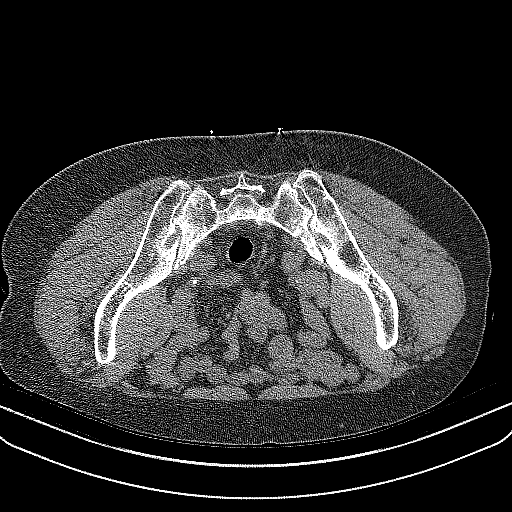
[im 20/34  soft-tissue]
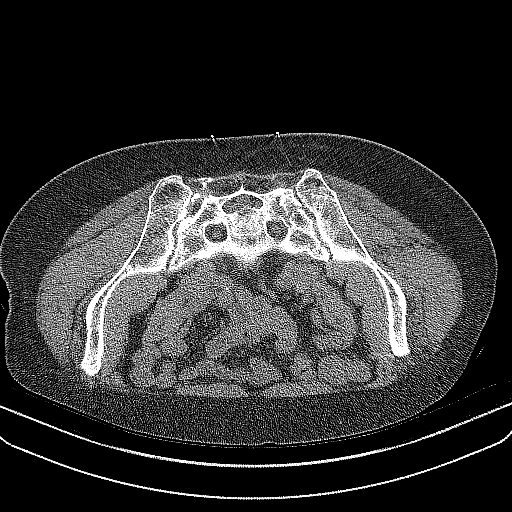
[im 27/34  soft-tissue]
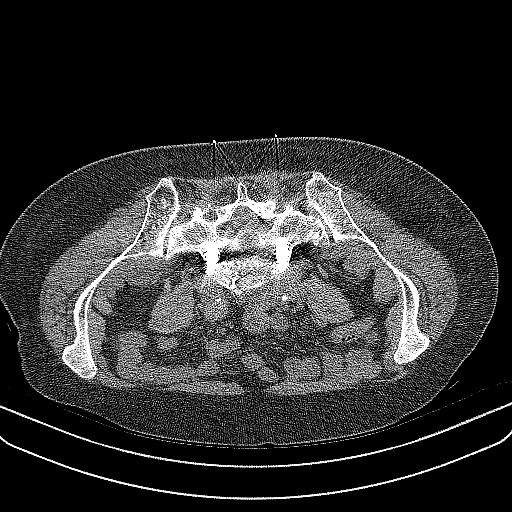

[Series 2: needle -guided injection · axial · 0.72mm/px · z∈[-154,-114]mm · 4 of 34 slices shown, 9 images (2 of 4)]
[im 7/34  soft-tissue]
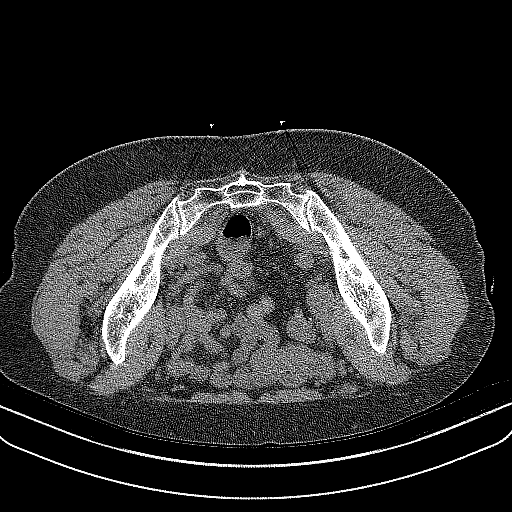
[im 7/34  lung]
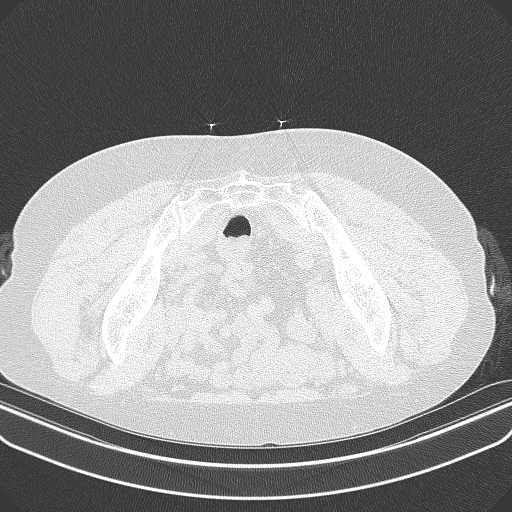
[im 7/34  bone]
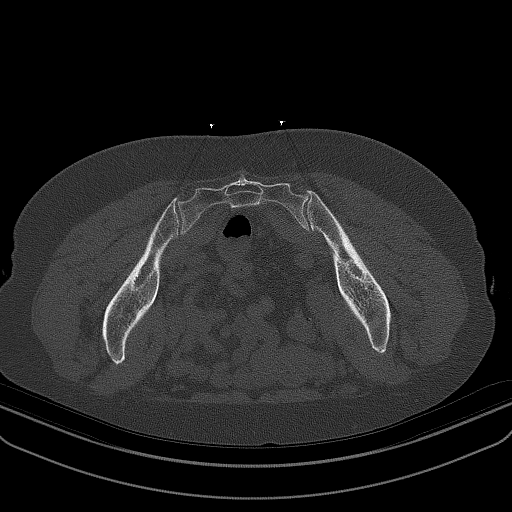
[im 14/34  soft-tissue]
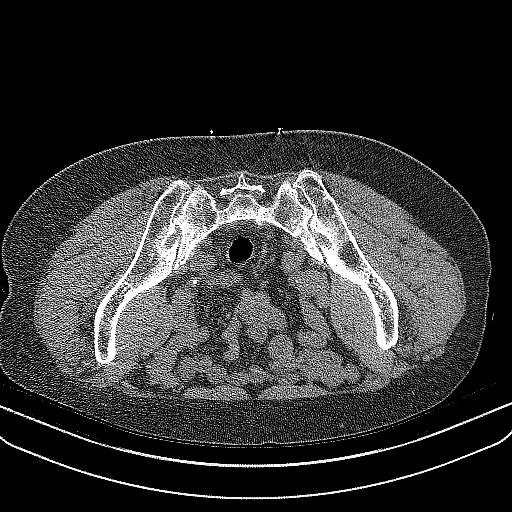
[im 14/34  lung]
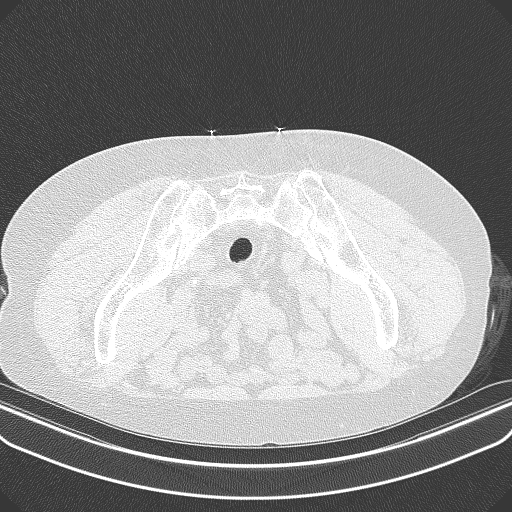
[im 20/34  soft-tissue]
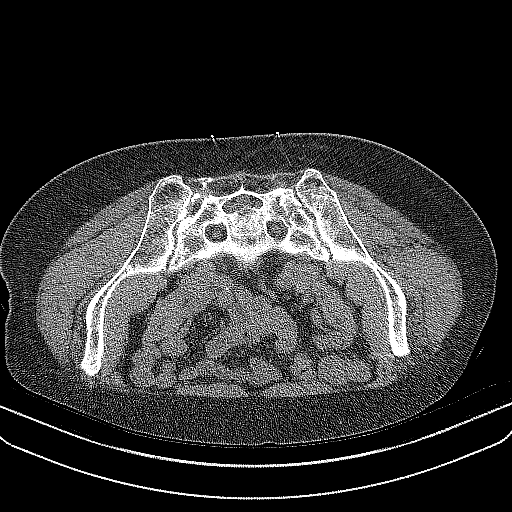
[im 20/34  lung]
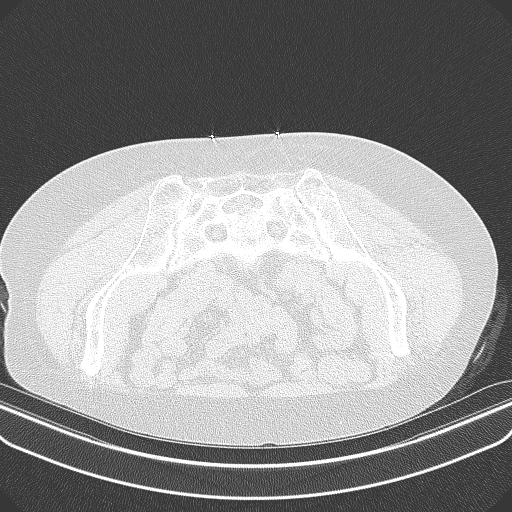
[im 27/34  soft-tissue]
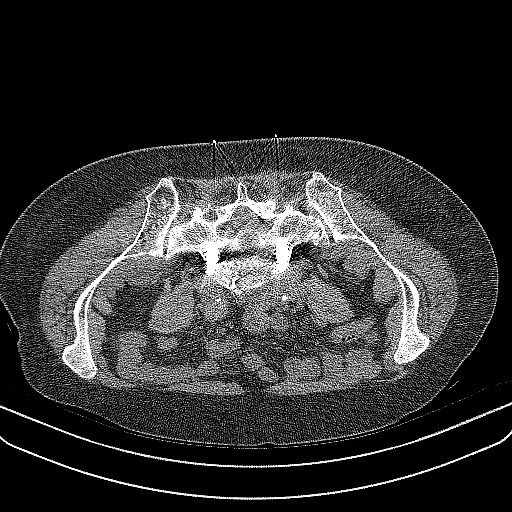
[im 27/34  lung]
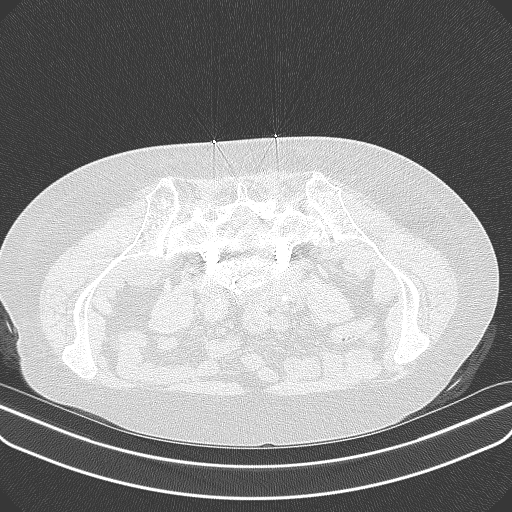

[Series 3: needle -guided injection · axial · 0.72mm/px · z∈[-169,-141]mm · 3 of 28 slices shown (3 of 4)]
[im 7/28  soft-tissue]
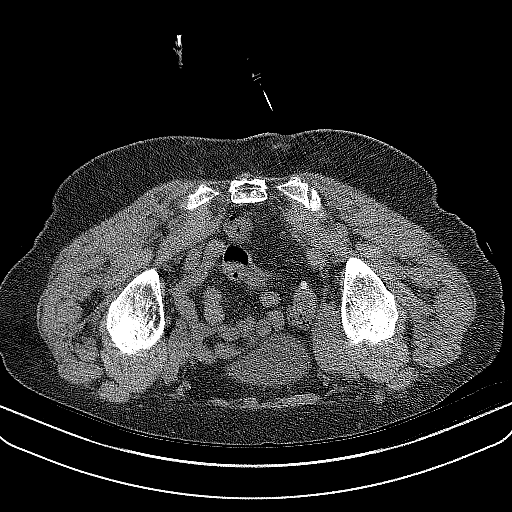
[im 14/28  soft-tissue]
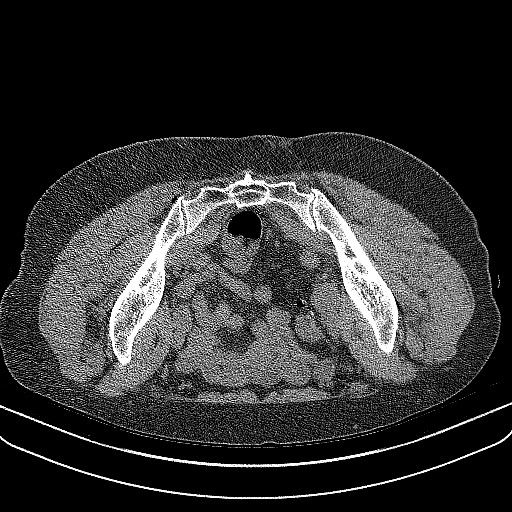
[im 21/28  soft-tissue]
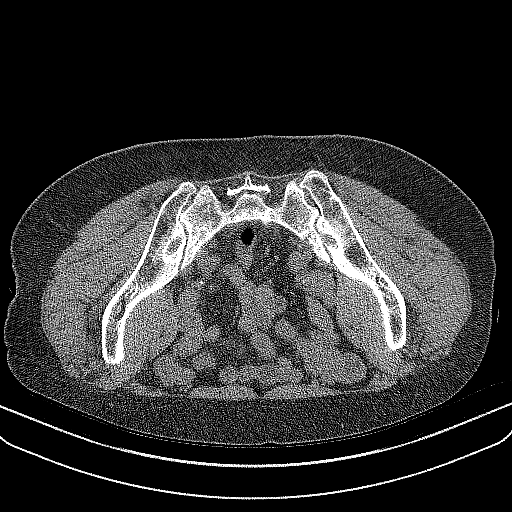

[Series 3: needle -guided injection · axial · 0.72mm/px · z∈[-169,-141]mm · 3 of 28 slices shown (4 of 4)]
[im 7/28  soft-tissue]
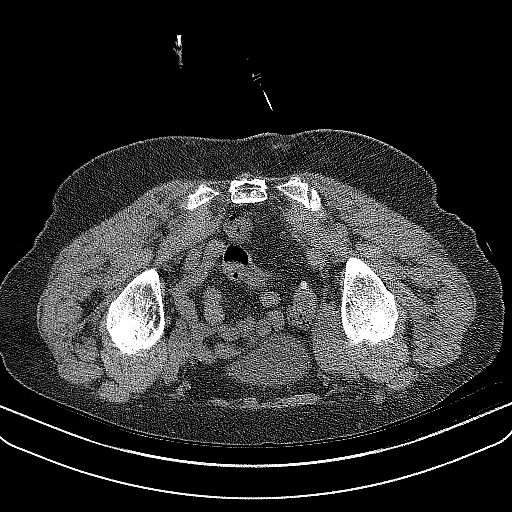
[im 14/28  soft-tissue]
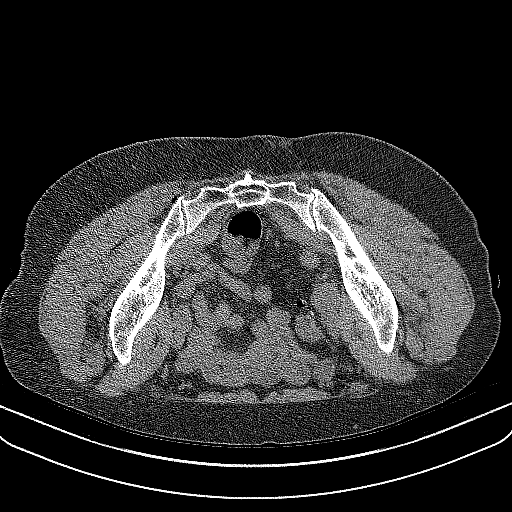
[im 21/28  soft-tissue]
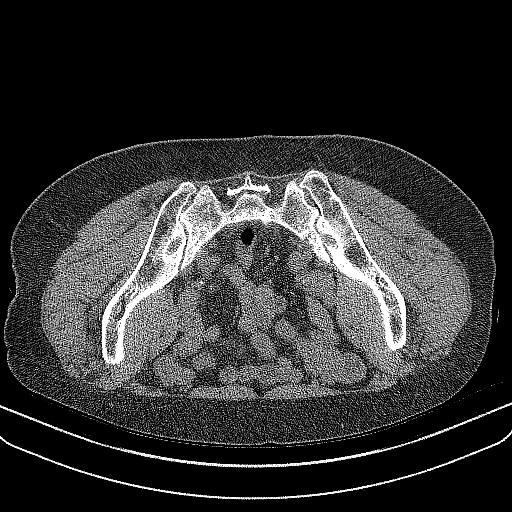

[14 of 32 positions shown; findings below may reference images not displayed]

EXAM:
Bilateral CT GUIDED SI JOINT INJECTION



After local anesthesia with 1% lidocaine without epinephrine and
subsequent deep anesthesia, 22g spinal needles were advanced into
the SI joints bilaterally under intermittent CT guidance.

Once the needles was in satisfactory position, representative images
was captured with the needles demonstrated in the sacroiliac joint.
Subsequently, 40 mg Depo-Medrol and 1 mL 1% lidocaine was injected
into the SI joints on each side. Needles removed and a sterile
dressing applied.

No complications were observed.
IMPRESSION: Successful CT-guided bilateral SI joint injections using steroid and
anesthetic.

## 2021-02-24 MED ORDER — METHYLPREDNISOLONE ACETATE 40 MG/ML INJ SUSP (RADIOLOG
80.0000 mg | Freq: Once | INTRAMUSCULAR | Status: AC
  Administered 2021-02-24: 80 mg via INTRA_ARTICULAR

## 2021-03-03 DIAGNOSIS — M5136 Other intervertebral disc degeneration, lumbar region: Secondary | ICD-10-CM | POA: Diagnosis not present

## 2021-03-03 DIAGNOSIS — M961 Postlaminectomy syndrome, not elsewhere classified: Secondary | ICD-10-CM | POA: Diagnosis not present

## 2021-03-03 DIAGNOSIS — G894 Chronic pain syndrome: Secondary | ICD-10-CM | POA: Diagnosis not present

## 2021-03-06 DIAGNOSIS — D508 Other iron deficiency anemias: Secondary | ICD-10-CM | POA: Diagnosis not present

## 2021-03-06 DIAGNOSIS — G4700 Insomnia, unspecified: Secondary | ICD-10-CM | POA: Diagnosis not present

## 2021-03-06 DIAGNOSIS — M8580 Other specified disorders of bone density and structure, unspecified site: Secondary | ICD-10-CM | POA: Diagnosis not present

## 2021-03-06 DIAGNOSIS — G43009 Migraine without aura, not intractable, without status migrainosus: Secondary | ICD-10-CM | POA: Diagnosis not present

## 2021-03-06 DIAGNOSIS — E7800 Pure hypercholesterolemia, unspecified: Secondary | ICD-10-CM | POA: Diagnosis not present

## 2021-03-06 DIAGNOSIS — M19079 Primary osteoarthritis, unspecified ankle and foot: Secondary | ICD-10-CM | POA: Diagnosis not present

## 2021-03-06 DIAGNOSIS — F3341 Major depressive disorder, recurrent, in partial remission: Secondary | ICD-10-CM | POA: Diagnosis not present

## 2021-03-11 DIAGNOSIS — M545 Low back pain, unspecified: Secondary | ICD-10-CM | POA: Diagnosis not present

## 2021-03-13 ENCOUNTER — Encounter: Payer: Self-pay | Admitting: Psychology

## 2021-03-13 ENCOUNTER — Other Ambulatory Visit: Payer: Self-pay

## 2021-03-13 ENCOUNTER — Encounter: Payer: PPO | Attending: Physical Medicine and Rehabilitation | Admitting: Psychology

## 2021-03-13 DIAGNOSIS — F331 Major depressive disorder, recurrent, moderate: Secondary | ICD-10-CM | POA: Diagnosis not present

## 2021-03-13 DIAGNOSIS — M5412 Radiculopathy, cervical region: Secondary | ICD-10-CM | POA: Insufficient documentation

## 2021-03-13 DIAGNOSIS — F5101 Primary insomnia: Secondary | ICD-10-CM | POA: Diagnosis not present

## 2021-03-13 DIAGNOSIS — M797 Fibromyalgia: Secondary | ICD-10-CM | POA: Insufficient documentation

## 2021-03-13 DIAGNOSIS — M5136 Other intervertebral disc degeneration, lumbar region: Secondary | ICD-10-CM

## 2021-03-13 DIAGNOSIS — F4310 Post-traumatic stress disorder, unspecified: Secondary | ICD-10-CM | POA: Diagnosis not present

## 2021-03-13 NOTE — Progress Notes (Signed)
Neuropsychological Consultation   Patient:   Sharon Logan   DOB:   Feb 18, 1956  MR Number:  [id number]  Location:  [facility] [place], [place] [id number] [place] [address] Dept: [phone]           Date of Service:   03/13/2028  Start Time:   9 AM End Time:   11 AM  Today's visit was an in person visit was conducted in my outpatient clinic office with the patient myself present.  Provider/Observer:  Ilean Skill, Psy.D.       Clinical Neuropsychologist       Billing Code/Service: [phone]  Chief Complaint:    Sharon Logan is a 65 year old female referred by Leeroy Cha, MD in conjunction with her PCP Marda Stalker, PA-C for neuropsychological consultation and therapeutic interventions due to residual and ongoing chronic pain issues in the setting of fibromyalgia and prior lumbar back surgery with continued chronic pain.  The patient is also been coping with cervical myofascial pain syndrome and intolerance to cold.  The patient describes significant fatigue, difficulty walking and moving and has described her muscles as feeling like "dead weight" in both her arms and legs.  The patient reports that the symptoms have been going on for at least 4 years but has become more severe since August of this year.  2 years ago she had back surgery with fusion at L4-L5 and L5-S1.  The patient has recently been tried on oxycodone for pain and has been using it for 2 months to see if it was helpful.  She is taking this twice per day.  The patient reports that she was diagnosed with fibromyalgia in 2010 and has had periodic flareups particular around times of cold and that these flareups come and go.  Current medications include baclofen, oxycodone and Wellbutrin.  Patient has tried TENS units before and there have been discussions about possible spinal cord stimulator trials but  this has not been pursued at this point.  Patient with significant depressive response along with her chronic pain and cervical myofascial pain.  She has just begun using trigger point injections with her physiatrist.  Reason for Service:  Sharon Logan is a 65 year old female referred by Leeroy Cha, MD in conjunction with her PCP Marda Stalker, PA-C for neuropsychological consultation and therapeutic interventions due to residual and ongoing chronic pain issues in the setting of fibromyalgia and prior lumbar back surgery with continued chronic pain.  The patient is also been coping with cervical myofascial pain syndrome and intolerance to cold.  The patient describes significant fatigue, difficulty walking and moving and has described her muscles as feeling like "dead weight" in both her arms and legs.  The patient reports that the symptoms have been going on for at least 4 years but has become more severe since August of this year.  2 years ago she had back surgery with fusion at L4-L5 and L5-S1.  The patient has recently been tried on oxycodone for pain and has been using it for 2 months to see if it was helpful.  She is taking this twice per day.  The patient reports that she was diagnosed with fibromyalgia in 2010 and has had periodic flareups particular around times of cold and that these flareups come and go.  Current medications include baclofen, oxycodone and Wellbutrin.  Patient has tried TENS units before and there have been discussions  about possible spinal cord stimulator trials but this has not been pursued at this point.  Patient with significant depressive response along with her chronic pain and cervical myofascial pain.  She has just begun using trigger point injections with her physiatrist.  The patient reports that she has had multiple back surgeries in 2018, 2019 and 2020.  Her current symptoms have become exacerbated and worsened in August of this year and reports that she is now in  constant pain and she has been talking and addressing this issue with her orthopedist about the possibility of removing the hardware that was placed previously.  The patient describes her pain as sharp, burning, stabbing, tingling and aching.  The patient does have a past medical history including depression and anxiety type symptoms along with her cervical and lumbar region difficulties.  The patient has been identified with vitamin D deficiency, neuropathy, radiculopathy, posttraumatic stress disorder.  We did not get into the elements of her PTSD during this visit.  The patient reports that she has significant difficulty falling asleep and will take up to 2 hours to fall asleep and only gets about 4 to 5 hours of sleep each night.  The patient reports that her appetite is good and that her memory and cognition are good.  Most recent imaging was a CT scan of her back done in February 2022 the impression/interpretation of these findings showed Disc levels: L1-2: Mild disc degeneration.  Negative for stenosis   L2-3: Mild retrolisthesis. Disc bulging and mild facet degeneration. Negative for stenosis.   L3-4: Pedicle screw and interbody fusion. Metal interbody spacer with subsidence into the endplates at U2-0. No solid bony fusion present. Pedicle screws in satisfactory position   L4-5: Pedicle screw and interbody fusion. Solid interbody fusion. Hardware in satisfactory position   L5-S1: Pedicle screw and interbody fusion. Persistent gas in the disc space. Probable pseudarthrosis at this level.   Bilateral screws extend into the iliac bone across the SI joint bilaterally. No hardware lucency or fracture.   IMPRESSION: Pedicle screw and interbody fusion L3-4. Subsidence of the spacer without bony fusion   Solid fusion L4-5   Pedicle screw and interbody fusion L5-S1 with probable pseudarthrosis.   Behavioral Observation: Sharon Logan  presents as a 65 y.o.-year-old Right Caucasian  Female who appeared her stated age. her dress was Appropriate and she was Well Groomed and her manners were Appropriate to the situation.  her participation was indicative of Appropriate and Attentive behaviors.  There were physical disabilities noted.  she displayed an appropriate level of cooperation and motivation.     Interactions:    Active Appropriate  Attention:   within normal limits and attention span and concentration were age appropriate  Memory:   within normal limits; recent and remote memory intact  Visuo-spatial:  not examined  Speech (Volume):  normal  Speech:   normal; normal  Thought Process:  Coherent and Relevant  Though Content:  WNL; not suicidal and not homicidal  Orientation:   person, place, time/date, and situation  Judgment:   Good  Planning:   Good  Affect:    Appropriate  Mood:    Dysphoric  Insight:   Good  Intelligence:   high  Marital Status/Living: The patient was born and raised in [place] along with 2 siblings.  She currently lives by herself.  The patient was married previously for 14 years and is now widowed.  The patient is a 63 year old daughter and a 67 year old son.  Current Employment: The patient is retired.  Past Employment:  The patient worked for the [employer] as a Scientist, clinical (histocompatibility and immunogenetics) for 34 years.  Hobbies and interest have included reading, walking and some gardening.  The patient was also in the [employer] serving in the [employer] between [place].  Highest rank was E4 and her rank at discharge was E4.  She had an honorable discharge.  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  Substance Use:  No concerns of substance abuse are reported.  Patient denies any substance use or abuse.  She denies any alcohol consumption or tobacco use.  Education:   The patient completed her bachelor's degree in business achieving at ending GPA of 3.26.  She has attended [employer] in [place].  Her best subject was history and reports that she did have minor difficulties relative to her good academic performance in math.  Medical History:   Past Medical History:  Diagnosis Date   Allergic rhinitis    Anxiety    Arthritis    Cancer ([place])    skin   Chronic pain    Chronically dry eyes    Depression    Fibromyalgia    GERD (gastroesophageal reflux disease)    Headache    Osteoporosis    Vitamin D deficiency    Wears glasses          Patient Active Problem List   Diagnosis Date Noted   S/P hardware removal 09/12/2020   Radiculopathy 06/29/2019   Cervical radiculopathy 08/15/2018   Chronic sinusitis 07/27/2018   Constipation 07/27/2018   Chronic bilateral low back pain with bilateral sciatica 07/01/2018   Disorder of bone and cartilage 04/04/2018   Esophageal reflux 04/04/2018   Other and unspecified hyperlipidemia 04/04/2018   Other malaise and fatigue 04/04/2018   Pain in joint involving ankle and foot 04/04/2018   Pain in soft tissues of limb 04/04/2018   Posttraumatic stress disorder 04/04/2018   Raynaud's phenomenon 04/04/2018   Skin sensation disturbance 04/04/2018   Sleep disturbance 04/04/2018   Vitamin D deficiency 04/04/2018   Myoclonus, segmental 10/18/2017   Adjacent segment disease with spinal stenosis 10/04/2017   DDD (degenerative disc disease), lumbar 10/30/2016   Spondylolisthesis of lumbar region 08/21/2016   Overweight with body mass index (BMI) 25.0-29.9 07/22/2016   Capsulitis of metatarsophalangeal (MTP) joint of right foot 03/23/2016   Lumbar spinal stenosis 12/05/2015   Migraine 09/13/2012   Allergic rhinitis 09/13/2012   Neuropathy 09/13/2012              Abuse/Trauma History: Patient does have a history of previous diagnosis of posttraumatic stress disorder but we have not reviewed this yet.  Psychiatric History:  Patient has a past psychiatric history including depression and anxiety as well as PTSD.  Family Med/Psych  History:  Family History  Problem Relation Age of Onset   Depression Mother    Heart attack Mother    COPD Mother    Depression Father    Heart attack Father    ADD / ADHD Son     Risk of Suicide/Violence: low patient denies any suicidal or homicidal ideation.  Impression/DX:  Sharon Logan is a 65 year old female referred by Leeroy Cha, MD in conjunction with her PCP Marda Stalker, PA-C for neuropsychological consultation and therapeutic interventions due to residual and ongoing chronic pain issues in the setting of fibromyalgia and prior lumbar back surgery with continued chronic pain.  The patient is also been coping with cervical myofascial pain syndrome and intolerance to cold.  The patient describes significant fatigue, difficulty walking and moving and has described her muscles as feeling like "dead weight" in both her arms and legs.  The patient reports that the symptoms have been going on for at least 4 years but has become more severe since August of this year.  2 years ago she had back surgery with fusion at L4-L5 and L5-S1.  The patient has recently been tried on oxycodone for pain and has been using it for 2 months to see if it was helpful.  She is taking this twice per day.  The patient reports that she was diagnosed with fibromyalgia in 2010 and has had periodic flareups particular around times of cold and that these flareups come and go.  Current medications include baclofen, oxycodone and Wellbutrin.  Patient has tried TENS units before and there have been discussions about possible spinal cord stimulator trials but this has not been pursued at this point.  Patient with significant depressive response along with her chronic pain and cervical myofascial pain.  She has just begun using trigger point injections with her physiatrist.  Disposition/Plan:  We have set the patient up for formal therapeutic interventions.  Because of scheduling challenges it will likely be sometime  before we begin the therapeutic efforts although the patient has been placed on the active waitlist and every effort will be made to move her next appointments to a soon as possible.  She has been scheduled for 4 visits 2 weeks apart for therapeutic interventions.  Diagnosis:    Fibromyalgia  Primary insomnia  Cervical radiculopathy  DDD (degenerative disc disease), lumbar  Moderate episode of recurrent major depressive disorder ([place])         Electronically Signed   _______________________ Ilean Skill, Psy.D. Clinical Neuropsychologist

## 2021-03-17 ENCOUNTER — Other Ambulatory Visit: Payer: Self-pay | Admitting: Orthopedic Surgery

## 2021-03-17 DIAGNOSIS — M545 Low back pain, unspecified: Secondary | ICD-10-CM

## 2021-03-18 ENCOUNTER — Ambulatory Visit: Payer: PPO | Admitting: Podiatry

## 2021-03-19 DIAGNOSIS — R1030 Lower abdominal pain, unspecified: Secondary | ICD-10-CM | POA: Diagnosis not present

## 2021-03-20 DIAGNOSIS — R300 Dysuria: Secondary | ICD-10-CM | POA: Diagnosis not present

## 2021-03-20 DIAGNOSIS — R1030 Lower abdominal pain, unspecified: Secondary | ICD-10-CM | POA: Diagnosis not present

## 2021-03-24 ENCOUNTER — Ambulatory Visit: Payer: PPO | Admitting: Podiatry

## 2021-03-24 ENCOUNTER — Telehealth: Payer: Self-pay

## 2021-03-25 ENCOUNTER — Encounter: Payer: PPO | Attending: Physical Medicine and Rehabilitation | Admitting: Physical Medicine and Rehabilitation

## 2021-03-25 ENCOUNTER — Other Ambulatory Visit: Payer: Self-pay

## 2021-03-25 VITALS — BP 172/83 | HR 70 | Temp 98.2°F | Ht 64.0 in | Wt 139.6 lb

## 2021-03-25 DIAGNOSIS — M797 Fibromyalgia: Secondary | ICD-10-CM | POA: Diagnosis not present

## 2021-03-25 DIAGNOSIS — M7918 Myalgia, other site: Secondary | ICD-10-CM | POA: Diagnosis not present

## 2021-03-25 MED ORDER — TOPIRAMATE 25 MG PO TABS: 25.0000 mg | ORAL_TABLET | Freq: Every day | ORAL | 2 refills | Status: DC

## 2021-03-25 NOTE — Patient Instructions (Signed)
Recommend topical CBD oil- discussed its benefits in reducing inflammation, pain, insomnia, and anxiety.  -Discussed that CBD oil differs from marijuana in that it does not contain THC- the substance that causes euphoria.  -Discussed that it is made from the hemp plant.  -It has been used for thousands of years -preliminary research suggests that if may be able to shrink cancerous tumors, stop plaque formation in Alzheimer's Disease, and slow the progress of brain disease from concussions.  -Additional benefits that have been demonstrated in studies include improved nausea, indigestion, and brain health, and reduced seizures.  -In a survey 92% of patients who tried medical cannabis felt it improved symptoms such as chronic pain, arthritis, migraines, and cancer.

## 2021-03-25 NOTE — Progress Notes (Signed)

## 2021-03-26 ENCOUNTER — Ambulatory Visit: Payer: PPO | Admitting: Neurology

## 2021-03-26 NOTE — Telephone Encounter (Signed)
F/u  Sharon Logan (Key: [id number]) Aimovig 70MG /ML auto-injectors   Form [employer] Electronic PA Form (2017 NCPDP) Created 1 day ago Sent to Plan 1 day ago Plan Response 1 day ago Submit Clinical Questions 1 day ago Determination Favorable 19 hours ago Message from [employer] Your PA request has been approved. Additional information will be provided in the approval communication. (Message 1145)

## 2021-03-29 ENCOUNTER — Other Ambulatory Visit: Payer: Self-pay

## 2021-03-29 ENCOUNTER — Ambulatory Visit
Admission: RE | Admit: 2021-03-29 | Discharge: 2021-03-29 | Disposition: A | Discharge status: Home / Self Care | Payer: PPO | Source: Ambulatory Visit | Attending: Orthopedic Surgery | Admitting: Orthopedic Surgery

## 2021-03-29 DIAGNOSIS — M545 Low back pain, unspecified: Secondary | ICD-10-CM

## 2021-03-29 IMAGING — CT CT L SPINE W/O CM
1 of 7 series · 6 of 14 positions shown, 8 images · non-contrast
Comparison: Fall [DATE]

CLINICAL DATA: Low back pain

EXAM:
CT LUMBAR SPINE WITHOUT CONTRAST
TECHNIQUE: Multidetector CT imaging of the lumbar spine was performed without
intravenous contrast administration. Multiplanar CT image
reconstructions were also generated.

[Series 3: l spine soft · axial · 0.32mm/px · z∈[-280,-128]mm · 6 of 108 slices shown, 8 images]
[im 16/108  soft-tissue]
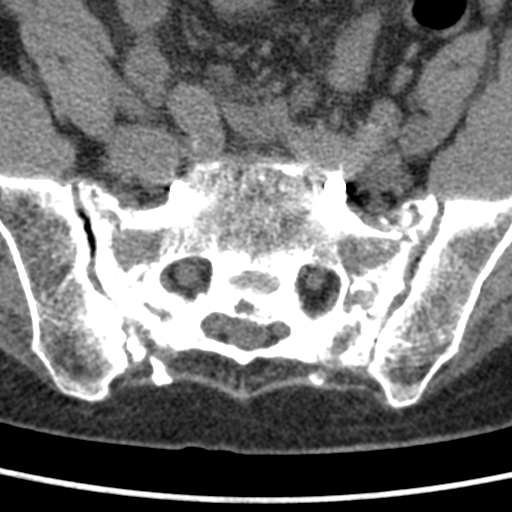
[im 16/108  bone]
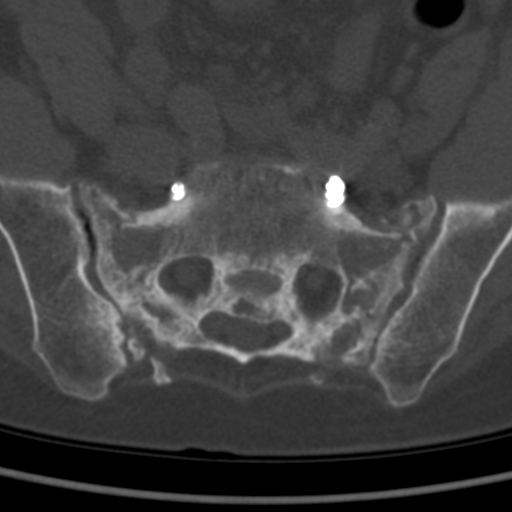
[im 31/108  bone]
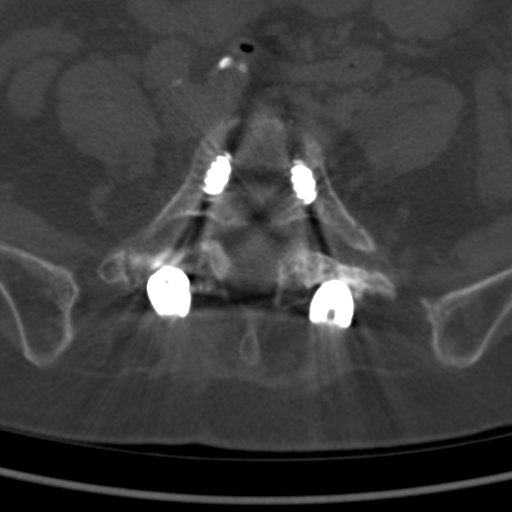
[im 46/108  bone]
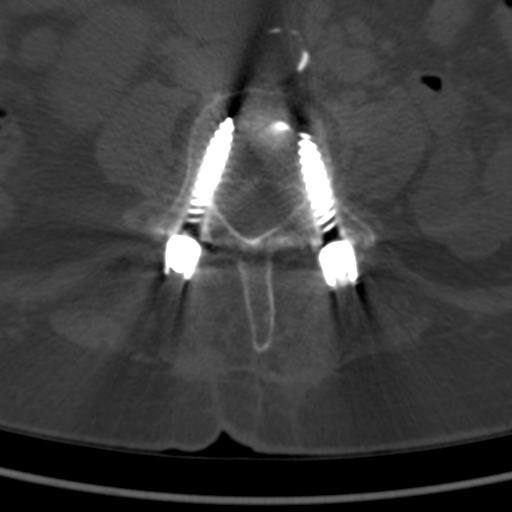
[im 62/108  bone]
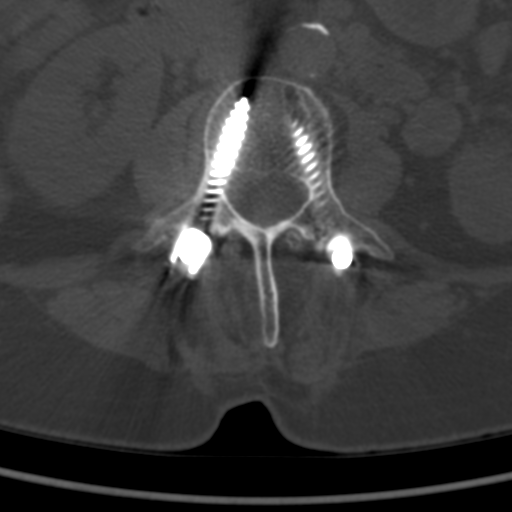
[im 77/108  soft-tissue]
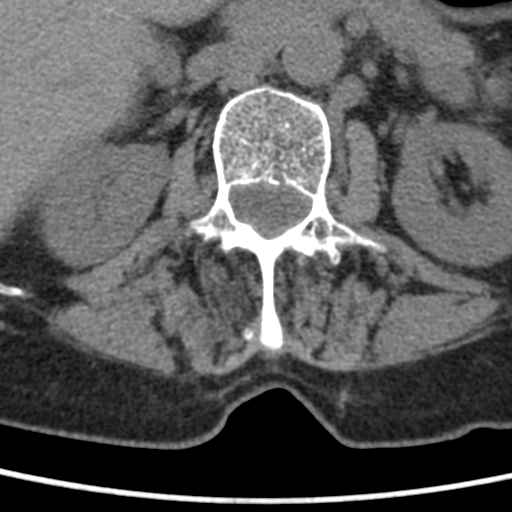
[im 77/108  bone]
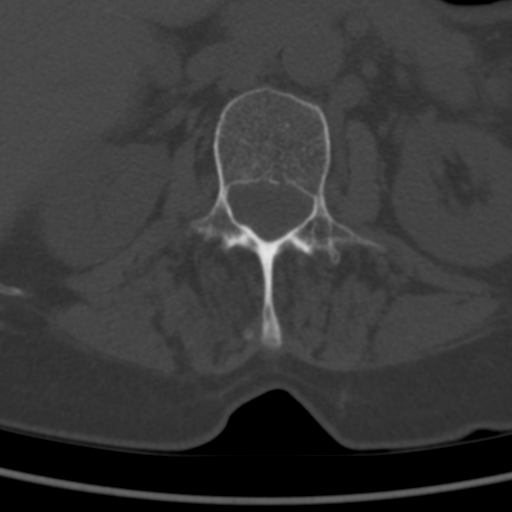
[im 92/108  bone]
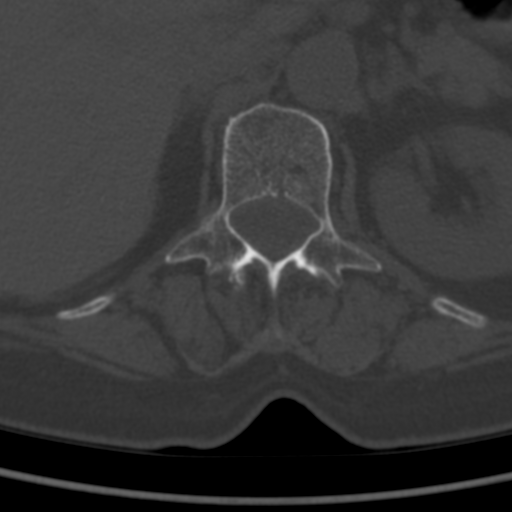

[6 of 14 positions shown; findings below may reference images not displayed]

FINDINGS: Segmentation: 5 lumbar type vertebrae.

Alignment: Normal in the lumbar spine.

Vertebrae: Horizontal and bilateral vertical sacral insufficiency
fractures with impaction greater on the left. No anterior sacral
foraminal involvement. Marked osteopenia with superimposed soft
fracture related sclerosis the pelvis. Pelvic screws have been
removed. Bilateral L5 transverse process fractures.

Bilateral sacroiliac osteoarthritis.

Paraspinal and other soft tissues: No perispinal mass or
inflammation seen. Atherosclerosis

Disc levels: L3-4 L5-S1 PLIF.
IMPRESSION: 1. Nonacute but unhealed bilateral vertical and horizontal sacral
insufficiency fractures with left sided impaction.
2. Subacute L5 transverse process fractures.

## 2021-04-01 DIAGNOSIS — M545 Low back pain, unspecified: Secondary | ICD-10-CM | POA: Diagnosis not present

## 2021-04-01 DIAGNOSIS — F4310 Post-traumatic stress disorder, unspecified: Secondary | ICD-10-CM | POA: Diagnosis not present

## 2021-04-02 DIAGNOSIS — M8580 Other specified disorders of bone density and structure, unspecified site: Secondary | ICD-10-CM | POA: Diagnosis not present

## 2021-04-02 DIAGNOSIS — M797 Fibromyalgia: Secondary | ICD-10-CM | POA: Diagnosis not present

## 2021-04-02 DIAGNOSIS — M461 Sacroiliitis, not elsewhere classified: Secondary | ICD-10-CM | POA: Diagnosis not present

## 2021-04-04 ENCOUNTER — Telehealth: Payer: Self-pay | Admitting: Psychiatry

## 2021-04-04 DIAGNOSIS — F331 Major depressive disorder, recurrent, moderate: Secondary | ICD-10-CM

## 2021-04-04 DIAGNOSIS — G4700 Insomnia, unspecified: Secondary | ICD-10-CM

## 2021-04-04 DIAGNOSIS — F4310 Post-traumatic stress disorder, unspecified: Secondary | ICD-10-CM

## 2021-04-04 MED ORDER — AMITRIPTYLINE HCL 10 MG PO TABS
10.0000 mg | ORAL_TABLET | Freq: Every day | ORAL | 0 refills | Status: DC
Start: 2021-04-04 — End: 2021-04-10

## 2021-04-04 NOTE — Telephone Encounter (Signed)
Patient informed Re: Wellbutrin and amitriptyline. She stated understanding and she said she would keep [place] informed.

## 2021-04-04 NOTE — Telephone Encounter (Signed)
Patient got the Rx for amitriptyline.  She stated she did not know if she should be taking the Wellbutrin in the morning and the amitriptyline at night. Please advise.

## 2021-04-04 NOTE — Telephone Encounter (Signed)
Yes, she can continue Wellbutrin in the morning and start Amitriptyline at night. She can stop Wellbutrin if she does not think it is helpful.

## 2021-04-04 NOTE — Telephone Encounter (Signed)
Please call pt with info

## 2021-04-04 NOTE — Telephone Encounter (Signed)
Patient called back in with questions about medications. She would like to know if she is to continue taking Wellbutrin in the morning and the Amitriptyline at night. " It was not discussed." Wants to make sure she is following directions properly. Pls rtc to discuss [phone]

## 2021-04-04 NOTE — Telephone Encounter (Signed)
Received hand written letter from patient stating," my depression and anxiety is back.  May I try amitriptyline.  I know it is good for pain which I have, plus depression.  I know it put weight on. I need help."  Please let pt know that Amitriptyline 10 mg was sent to her pharmacy. Will start with low dose and can increase if needed and if she seems to be tolerating it ok.

## 2021-04-08 ENCOUNTER — Telehealth: Payer: Self-pay | Admitting: Psychiatry

## 2021-04-08 ENCOUNTER — Other Ambulatory Visit: Payer: Self-pay | Admitting: Orthopedic Surgery

## 2021-04-08 DIAGNOSIS — G4700 Insomnia, unspecified: Secondary | ICD-10-CM

## 2021-04-08 DIAGNOSIS — F4310 Post-traumatic stress disorder, unspecified: Secondary | ICD-10-CM

## 2021-04-08 DIAGNOSIS — F331 Major depressive disorder, recurrent, moderate: Secondary | ICD-10-CM

## 2021-04-08 NOTE — Telephone Encounter (Signed)
Pt stated she wants to increase amitriptyline to 20 mg.She stated it helps her with depression but not too much with sleep.She is also requesting to go back on mirtazapine.I informed her you were out sick and there may be a delay in response.She understands.

## 2021-04-08 NOTE — Telephone Encounter (Signed)
Sharon Logan called and LM. She would like to increase her Amitryptylene dose at night. She also would like to renew her last anxiety med she took in March.

## 2021-04-10 DIAGNOSIS — X32XXXS Exposure to sunlight, sequela: Secondary | ICD-10-CM | POA: Diagnosis not present

## 2021-04-10 DIAGNOSIS — Z85828 Personal history of other malignant neoplasm of skin: Secondary | ICD-10-CM | POA: Diagnosis not present

## 2021-04-10 DIAGNOSIS — D225 Melanocytic nevi of trunk: Secondary | ICD-10-CM | POA: Diagnosis not present

## 2021-04-10 DIAGNOSIS — L570 Actinic keratosis: Secondary | ICD-10-CM | POA: Diagnosis not present

## 2021-04-10 DIAGNOSIS — L814 Other melanin hyperpigmentation: Secondary | ICD-10-CM | POA: Diagnosis not present

## 2021-04-10 DIAGNOSIS — H61002 Unspecified perichondritis of left external ear: Secondary | ICD-10-CM | POA: Diagnosis not present

## 2021-04-10 MED ORDER — AMITRIPTYLINE HCL 25 MG PO TABS: 25.0000 mg | ORAL_TABLET | Freq: Every day | ORAL | 0 refills | Status: DC

## 2021-04-10 MED ORDER — MIRTAZAPINE 15 MG PO TABS: 15.0000 mg | ORAL_TABLET | Freq: Every day | ORAL | 0 refills | Status: DC

## 2021-04-10 NOTE — Telephone Encounter (Signed)
Please let her know that Mirtazapine was sent to her pharmacy along with Amitriptyline 25 mg tablets. She can take two of the Amitriptyline 10 mg at [facility] for now if she prefers.

## 2021-04-10 NOTE — Addendum Note (Signed)
Addended by: Sharyl Nimrod on: 04/10/2021 08:45 AM   Modules accepted: Orders

## 2021-04-10 NOTE — Telephone Encounter (Signed)
Pt informed

## 2021-04-15 ENCOUNTER — Other Ambulatory Visit: Payer: Self-pay

## 2021-04-15 ENCOUNTER — Encounter: Payer: Self-pay | Admitting: Psychiatry

## 2021-04-15 ENCOUNTER — Ambulatory Visit (INDEPENDENT_AMBULATORY_CARE_PROVIDER_SITE_OTHER): Payer: PPO | Admitting: Psychiatry

## 2021-04-15 ENCOUNTER — Ambulatory Visit: Payer: PPO | Admitting: Psychiatry

## 2021-04-15 DIAGNOSIS — G4700 Insomnia, unspecified: Secondary | ICD-10-CM

## 2021-04-15 DIAGNOSIS — F331 Major depressive disorder, recurrent, moderate: Secondary | ICD-10-CM

## 2021-04-15 DIAGNOSIS — F4310 Post-traumatic stress disorder, unspecified: Secondary | ICD-10-CM | POA: Diagnosis not present

## 2021-04-15 NOTE — Progress Notes (Signed)
Sharon Logan [id number] Nov 25, 1955 65 y.o.  Subjective:   Patient ID:  Sharon Logan is a 65 y.o. (DOB 1955-07-03) female.  Chief Complaint:  Chief Complaint  Patient presents with   Depression    HPI Sharon Logan presents to the office today for follow-up of depression. She reports having recent depression. She reports "raw, hard pain" in hips, back, and groin. She reports that she tried to use OTC meds to manage pain. She was prescribed oxycodone and that this has caused her nausea. She reports that it helps some with pain. Rates pain a 6-7 our of 10 daily. She reports that pain triggers depression.   She reports that Amitriptyline has "eased the pain." She reports "no energy." Appetite fluctuates. She reports that she generally likes food and appetite has been less and is drinking Ensure BID. No motivation. Sleep was poor when she started Amitriptyline and now sleep has improved and is no longer awakening every 2 hours due to pain. She reports concentration is fair. Denies SI.   She reports some anxiety. Denies panic s/s.   Reports that she has been unable to take Mirtazapine due to stomach pain. Stopped taking Wellbutrin since it was helpful for energy and not depression.   Has been seeing [name], [facility].   Had to cancel family trip to [place] due to pain.   Has surgery scheduled for 04/24/21.   Past Medication Trials: Lexapro- "My anti-depressant of choice" and gives her energy. Was taking 2-3 years prior to surgery. Re-started one week ago. Has taken 10 mg and 20 mg doses in the past. Thinks it may have helped with pain in the past. Cymbalta- Started after back surgery. Has made her tired, even when reduced to 30 mg. Was having to take 2 hour naps. May have helped slightly with pain. Had HA, Constipation, Nausea Savella- muscle aches, rhabdomyolysis Effexor-HA, helped with excessive sweating.  Pristiq- May have increased depression. Prozac- Constipation. Causes her  to feel cold.  Celexa Lexapro Zoloft- worsening mood Paxil Wellbutrin- Did not cause HA's. Tolerated well and was effective for depression. Amitriptyline- Did not cause HA's. Weight gain. Effective.  Nortriptyline- Did not cause HA's. Effective. Lamictal- Rash Gabapentin -Rhabomyolysis Lyrica- Adverse reaction Buspar Trazodone- helped with sleep initiation but sleep was not restful. Ambien- Parasomnia Lunesta- Tolerated, effective at 2 mg dose Remeron- Dry eyes, internal restlessness. Stomach discomfort.  Clonidine Topamax- ineffective for headaches. Caused cognitive side effects.     [facility] Visit from 03/25/2021 in [place] Visit from 12/24/2020 in [place] Visit from 10/08/2020 in [facility]  PHQ-2 Total Score 0 2 2  PHQ-9 Total Score -- -- 8      Flowsheet Row Admission (Discharged) from 09/12/2020 in [place] [facility] from 09/10/2020 in [facility] PREADMISSION TESTING  C-SSRS RISK CATEGORY No Risk No Risk        Review of Systems:  Review of Systems  Musculoskeletal:  Positive for back pain. Negative for gait problem.       Hip pain  Neurological:  Negative for tremors.  Psychiatric/Behavioral:         Please refer to HPI   Medications: I have reviewed the patient's current medications.  Current Outpatient Medications  Medication Sig Dispense Refill   amitriptyline (ELAVIL) 25 MG tablet Take 1 tablet (25 mg total) by  mouth at bedtime. 30 tablet 0   b complex vitamins capsule Take 2 capsules by mouth daily.     baclofen (LIORESAL) 10 MG tablet Take 10 mg by mouth every 8 (eight) hours as needed for muscle spasms.     Carboxymethylcellulose Sodium (THERATEARS OP) Apply 1 drop to eye 4 (four) times daily as needed (dry eyes).     cholecalciferol (VITAMIN D) 25  MCG (1000 UNIT) tablet Take 2,000 Units by mouth in the morning.     fluticasone (FLONASE) 50 MCG/ACT nasal spray Place 2 sprays into both nostrils 2 (two) times daily as needed for allergies or rhinitis.     nitroGLYCERIN (NITRO-DUR) 0.2 mg/hr patch Place 1 patch (0.2 mg total) onto the skin daily. (Patient taking differently: Place 0.2 mg onto the skin daily as needed (bunion flare).) 30 patch 2   oxyCODONE-acetaminophen (PERCOCET) 10-325 MG tablet Take 1 tablet by mouth 2 (two) times daily as needed for pain.     rizatriptan (MAXALT) 10 MG tablet Take 10 mg by mouth daily as needed for migraine.      acetaminophen (TYLENOL) 325 MG tablet Take 650 mg by mouth every 6 (six) hours as needed for moderate pain.     alendronate (FOSAMAX) 70 MG tablet Take 70 mg by mouth every Saturday.     cetirizine (ZYRTEC) 10 MG tablet Take 10 mg by mouth daily as needed for allergies.     DRYSOL 20 % external solution Apply 1 application topically daily as needed (sweat).     Erenumab-aooe (AIMOVIG) 70 MG/ML SOAJ Inject 70 mg into the skin every 28 (twenty-eight) days. (Patient not taking: No sig reported) 1 mL 0   levothyroxine (SYNTHROID) 50 MCG tablet Take 1 tablet (50 mcg total) by mouth daily before breakfast. (Patient not taking: No sig reported) 30 tablet 3   saccharomyces boulardii (FLORASTOR) 250 MG capsule Take 500 mg by mouth daily.     No current facility-administered medications for this visit.    Medication Side Effects: Other: Slight dizziness with getting out bed.   Allergies:  Allergies  Allergen Reactions   Gabapentin Other (See Comments)    Delirium, stroke like symptoms    Polymyxin B Other (See Comments)    Eyes go blood red    Zolpidem Rash    "sleep walk"    Cymbalta [Duloxetine Hcl] Other (See Comments)    Headaches, constipation   Fluoxetine Other (See Comments)    CONSTIPATION    Lamotrigine Rash   Other Other (See Comments)    OTOBIOTIC > RED EYES   Pregabalin Rash     Itchy red rash on chest     Past Medical History:  Diagnosis Date   Allergic rhinitis    Anxiety    Arthritis    Cancer (HCC)    skin   Chronic pain    Chronically dry eyes    Depression    Fibromyalgia    GERD (gastroesophageal reflux disease)    Headache    Osteoporosis    Vitamin D deficiency    Wears glasses     Past Medical History, Surgical history, Social history, and Family history were reviewed and updated as appropriate.   Please see review of systems for further details on the patient's review from today.   Objective:   Physical Exam:  There were no vitals taken for this visit.  Physical Exam Constitutional:      General: She is not in acute distress. Musculoskeletal:  General: No deformity.  Neurological:     Mental Status: She is alert and oriented to person, place, and time.     Coordination: Coordination normal.  Psychiatric:        Attention and Perception: Attention and perception normal. She does not perceive auditory or visual hallucinations.        Mood and Affect: Mood is anxious and depressed. Affect is not labile, blunt, angry or inappropriate.        Speech: Speech normal.        Behavior: Behavior normal.        Thought Content: Thought content normal. Thought content is not paranoid or delusional. Thought content does not include homicidal or suicidal ideation. Thought content does not include homicidal or suicidal plan.        Cognition and Memory: Cognition and memory normal.        Judgment: Judgment normal.     Comments: Insight intact    Lab Review:     Component Value Date/Time   NA 141 09/10/2020 0918   K 3.9 09/10/2020 0918   CL 108 09/10/2020 0918   CO2 26 09/10/2020 0918   GLUCOSE 85 09/10/2020 0918   BUN 9 09/10/2020 0918   CREATININE 0.76 09/10/2020 0918   CALCIUM 9.6 09/10/2020 0918   PROT 7.5 09/10/2020 0918   ALBUMIN 4.1 09/10/2020 0918   AST 31 09/10/2020 0918   ALT 28 09/10/2020 0918   ALKPHOS 68  09/10/2020 0918   BILITOT 0.6 09/10/2020 0918   GFRNONAA >60 09/10/2020 0918   GFRAA >60 06/26/2019 1008       Component Value Date/Time   WBC 6.8 09/10/2020 0918   RBC 4.44 09/10/2020 0918   HGB 13.7 09/10/2020 0918   HCT 42.9 09/10/2020 0918   PLT 278 09/10/2020 0918   MCV 96.6 09/10/2020 0918   MCH 30.9 09/10/2020 0918   MCHC 31.9 09/10/2020 0918   RDW 13.0 09/10/2020 0918   LYMPHSABS 2.0 09/10/2020 0918   MONOABS 0.3 09/10/2020 0918   EOSABS 0.1 09/10/2020 0918   BASOSABS 0.0 09/10/2020 0918    No results found for: POCLITH, LITHIUM   No results found for: PHENYTOIN, PHENOBARB, VALPROATE, CBMZ   .res Assessment: Plan:   Pt seen for 30 minutes and time spent discussing treatment options. She reports that she stopped Remeron due to side effects and Wellbutrin XL due to side effects. Discussed that more time is needed for response to recent initiation of Amitriptyline.  Discussed continuing Amitriptyline at 25 mg until after surgery to minimize risk of side effects prior to surgery. Pt is in agreement with this plan. Will re-evaluate dose of Amirtriptyline following surgery.  Pt to follow-up in 4-6 weeks or sooner if clinically indicated.  Patient advised to contact office with any questions, adverse effects, or acute worsening in signs and symptoms.   Sumayya was seen today for depression.  Diagnoses and all orders for this visit:  Moderate recurrent major depression (HCC)  PTSD (post-traumatic stress disorder)  Insomnia, unspecified type    Please see After Visit Summary for patient specific instructions.  Future Appointments  Date Time Provider [place]  04/21/2021  9:00 AM [facility] PAT 1 [facility] None  04/21/2021 11:15 AM Trula Slade, DPM [facility]  05/29/2021  2:30 PM Thayer Headings, PMHNP [facility] None  07/15/2021 11:20 AM Ranell Patrick, Clide Deutscher, MD [facility] CPR  07/24/2021 11:00 AM Edgardo Roys, PsyD [facility] CPR  09/01/2021  2:00 PM  Edgardo Roys,  PsyD [facility] CPR  09/15/2021  1:00 PM Edgardo Roys, PsyD [facility] CPR  10/06/2021  9:50 AM Metta Clines R, DO [facility] None    No orders of the defined types were placed in this encounter.   -------------------------------

## 2021-04-17 ENCOUNTER — Ambulatory Visit: Payer: PPO | Admitting: Psychiatry

## 2021-04-18 ENCOUNTER — Ambulatory Visit: Payer: PPO | Admitting: Podiatry

## 2021-04-18 NOTE — Progress Notes (Signed)
Surgical Instructions    Your procedure is scheduled on Thursday November 10th.  Report to [facility] Main Entrance "A" at 5:30 A.M., then check in with the Admitting office.  Call this number if you have problems the morning of surgery:  [phone]   If you have any questions prior to your surgery date call [phone]: Open Monday-Friday 8am-4pm    Remember:  Do not eat after midnight the night before your surgery  You may drink clear liquids until 4:30am the morning of your surgery.   Clear liquids allowed are: Water, Non-Citrus Juices (without pulp), Carbonated Beverages, Clear Tea, Black Coffee ONLY (NO MILK, CREAM OR POWDERED CREAMER of any kind), and Gatorade   Enhanced Recovery after Surgery for Orthopedics Enhanced Recovery after Surgery is a protocol used to improve the stress on your body and your recovery after surgery.  Patient Instructions  The day of surgery (if you do NOT have diabetes):  Drink ONE (1) Pre-Surgery Clear Ensure by ___4:30__ am the morning of surgery   This drink was given to you during your hospital  pre-op appointment visit. Nothing else to drink after completing the  Pre-Surgery Clear Ensure.          If you have questions, please contact your surgeon's office.    Take these medicines the morning of surgery with A SIP OF WATER IF NEEDED  acetaminophen (TYLENOL) 325 MG tablet baclofen (LIORESAL) 10 MG tablet Carboxymethylcellulose Sodium (THERATEARS OP) cetirizine (ZYRTEC) 10 MG tablet fluticasone (FLONASE) 50 MCG/ACT nasal spray oxyCODONE-acetaminophen (PERCOCET) 10-325 MG tablet rizatriptan (MAXALT) 10 MG tablet  As of today, STOP taking any Aspirin (unless otherwise instructed by your surgeon) Aleve, Naproxen, Ibuprofen, Motrin, Advil, Goody's, BC's, all herbal medications, fish oil, and all vitamins.     After your COVID test   You are not required to quarantine however you are required to wear a well-fitting mask when you are  out and around people not in your household.  If your mask becomes wet or soiled, replace with a new one.  Wash your hands often with soap and water for 20 seconds or clean your hands with an alcohol-based hand sanitizer that contains at least 60% alcohol.  Do not share personal items.  Notify your provider: if you are in close contact with someone who has COVID  or if you develop a fever of 100.4 or greater, sneezing, cough, sore throat, shortness of breath or body aches.             Do not wear jewelry or makeup Do not wear lotions, powders, perfumes, or deodorant. Do not shave 48 hours prior to surgery.   Do not bring valuables to the hospital. DO Not wear nail polish, gel polish, artificial nails, or any other type of covering on natural nails including finger and toenails. If patients have artificial nails, gel coating, etc. that need to be removed by a nail salon, please have this removed prior to surgery or surgery may need to be canceled/delayed if the surgeon/ anesthesia feels like the patient is unable to be adequately monitored.             [facility] is not responsible for any belongings or valuables.  Do NOT Smoke (Tobacco/Vaping)  24 hours prior to your procedure  If you use a CPAP at night, you may bring your mask for your overnight stay.   Contacts, glasses, hearing aids, dentures or partials may not be worn into surgery, please bring cases for  these belongings   For patients admitted to the hospital, discharge time will be determined by your treatment team.   Patients discharged the day of surgery will not be allowed to drive home, and someone needs to stay with them for 24 hours.  NO VISITORS WILL BE ALLOWED IN PRE-OP WHERE PATIENTS ARE PREPPED FOR SURGERY.  ONLY 1 SUPPORT PERSON MAY BE PRESENT IN THE WAITING ROOM WHILE YOU ARE IN SURGERY.  IF YOU ARE TO BE ADMITTED, ONCE YOU ARE IN YOUR ROOM YOU WILL BE ALLOWED TWO (2) VISITORS. 1 (ONE) VISITOR MAY STAY OVERNIGHT  BUT MUST ARRIVE TO THE ROOM BY 8pm.  Minor children may have two parents present. Special consideration for safety and communication needs will be reviewed on a case by case basis.  Special instructions:    Oral Hygiene is also important to reduce your risk of infection.  Remember - BRUSH YOUR TEETH THE MORNING OF SURGERY WITH YOUR REGULAR TOOTHPASTE   [facility]- Preparing For Surgery  Before surgery, you can play an important role. Because skin is not sterile, your skin needs to be as free of germs as possible. You can reduce the number of germs on your skin by washing with CHG (chlorahexidine gluconate) Soap before surgery.  CHG is an antiseptic cleaner which kills germs and bonds with the skin to continue killing germs even after washing.     Please do not use if you have an allergy to CHG or antibacterial soaps. If your skin becomes reddened/irritated stop using the CHG.  Do not shave (including legs and underarms) for at least 48 hours prior to first CHG shower. It is OK to shave your face.  Please follow these instructions carefully.     Shower the NIGHT BEFORE SURGERY and the MORNING OF SURGERY with CHG Soap.   If you chose to wash your hair, wash your hair first as usual with your normal shampoo. After you shampoo, rinse your hair and body thoroughly to remove the shampoo.  Then [employer] and genitals (private parts) with your normal soap and rinse thoroughly to remove soap.  After that Use CHG Soap as you would any other liquid soap. You can apply CHG directly to the skin and wash gently with a scrungie or a clean washcloth.   Apply the CHG Soap to your body ONLY FROM THE NECK DOWN.  Do not use on open wounds or open sores. Avoid contact with your eyes, ears, mouth and genitals (private parts). Wash Face and genitals (private parts)  with your normal soap.   Wash thoroughly, paying special attention to the area where your surgery will be performed.  Thoroughly rinse your body  with warm water from the neck down.  DO NOT shower/wash with your normal soap after using and rinsing off the CHG Soap.  Pat yourself dry with a CLEAN TOWEL.  Wear CLEAN PAJAMAS to bed the night before surgery  Place CLEAN SHEETS on your bed the night before your surgery  DO NOT SLEEP WITH PETS.   Day of Surgery:  Take a shower with CHG soap. Wear Clean/Comfortable clothing the morning of surgery Do not apply any deodorants/lotions.   Remember to brush your teeth WITH YOUR REGULAR TOOTHPASTE.   Please read over the following fact sheets that you were given.

## 2021-04-21 ENCOUNTER — Encounter (HOSPITAL_COMMUNITY)
Admission: RE | Admit: 2021-04-21 | Discharge: 2021-04-21 | Disposition: A | Discharge status: Home / Self Care | Payer: PPO | Source: Ambulatory Visit | Attending: Orthopedic Surgery | Admitting: Orthopedic Surgery

## 2021-04-21 ENCOUNTER — Ambulatory Visit (INDEPENDENT_AMBULATORY_CARE_PROVIDER_SITE_OTHER): Payer: PPO | Admitting: Podiatry

## 2021-04-21 ENCOUNTER — Encounter (HOSPITAL_COMMUNITY): Payer: Self-pay

## 2021-04-21 ENCOUNTER — Other Ambulatory Visit: Payer: Self-pay

## 2021-04-21 VITALS — BP 140/73 | HR 73 | Temp 97.6°F | Resp 17 | Ht 64.0 in | Wt 138.7 lb

## 2021-04-21 DIAGNOSIS — M779 Enthesopathy, unspecified: Secondary | ICD-10-CM

## 2021-04-21 DIAGNOSIS — Z20822 Contact with and (suspected) exposure to covid-19: Secondary | ICD-10-CM | POA: Insufficient documentation

## 2021-04-21 DIAGNOSIS — M205X9 Other deformities of toe(s) (acquired), unspecified foot: Secondary | ICD-10-CM

## 2021-04-21 DIAGNOSIS — Z01812 Encounter for preprocedural laboratory examination: Secondary | ICD-10-CM | POA: Diagnosis not present

## 2021-04-21 DIAGNOSIS — M21961 Unspecified acquired deformity of right lower leg: Secondary | ICD-10-CM

## 2021-04-21 DIAGNOSIS — Z01818 Encounter for other preprocedural examination: Secondary | ICD-10-CM

## 2021-04-21 DIAGNOSIS — M549 Dorsalgia, unspecified: Secondary | ICD-10-CM | POA: Diagnosis not present

## 2021-04-21 DIAGNOSIS — M722 Plantar fascial fibromatosis: Secondary | ICD-10-CM | POA: Diagnosis not present

## 2021-04-21 LAB — URINALYSIS, ROUTINE W REFLEX MICROSCOPIC
Bilirubin Urine: NEGATIVE
Glucose, UA: NEGATIVE mg/dL
Hgb urine dipstick: NEGATIVE
Ketones, ur: NEGATIVE mg/dL
Leukocytes,Ua: NEGATIVE
Nitrite: NEGATIVE
Protein, ur: NEGATIVE mg/dL
Specific Gravity, Urine: 1.011 (ref 1.005–1.030)
pH: 8 (ref 5.0–8.0)

## 2021-04-21 LAB — COMPREHENSIVE METABOLIC PANEL
ALT: 21 U/L (ref 0–44)
AST: 26 U/L (ref 15–41)
Albumin: 3.8 g/dL (ref 3.5–5.0)
Alkaline Phosphatase: 120 U/L (ref 38–126)
Anion gap: 8 (ref 5–15)
BUN: 17 mg/dL (ref 8–23)
CO2: 26 mmol/L (ref 22–32)
Calcium: 9.5 mg/dL (ref 8.9–10.3)
Chloride: 104 mmol/L (ref 98–111)
Creatinine, Ser: 0.77 mg/dL (ref 0.44–1.00)
GFR, Estimated: >60 mL/min (ref >60)
Glucose, Bld: 93 mg/dL (ref 70–99)
Potassium: 4 mmol/L (ref 3.5–5.1)
Sodium: 138 mmol/L (ref 135–145)
Total Bilirubin: 0.7 mg/dL (ref 0.3–1.2)
Total Protein: 7.4 g/dL (ref 6.5–8.1)

## 2021-04-21 LAB — CBC WITH DIFFERENTIAL/PLATELET
Abs Immature Granulocytes: 0.03 10*3/uL (ref 0.00–0.07)
Basophils Absolute: 0.1 10*3/uL (ref 0.0–0.1)
Basophils Relative: 1 %
Eosinophils Absolute: 0.3 10*3/uL (ref 0.0–0.5)
Eosinophils Relative: 3 %
HCT: 40.3 % (ref 36.0–46.0)
Hemoglobin: 12.4 g/dL (ref 12.0–15.0)
Immature Granulocytes: 0 %
Lymphocytes Relative: 17 %
Lymphs Abs: 1.5 10*3/uL (ref 0.7–4.0)
MCH: 30 pg (ref 26.0–34.0)
MCHC: 30.8 g/dL (ref 30.0–36.0)
MCV: 97.3 fL (ref 80.0–100.0)
Monocytes Absolute: 0.5 10*3/uL (ref 0.1–1.0)
Monocytes Relative: 6 %
Neutro Abs: 6.5 10*3/uL (ref 1.7–7.7)
Neutrophils Relative %: 73 %
Platelets: 366 10*3/uL (ref 150–400)
RBC: 4.14 MIL/uL (ref 3.87–5.11)
RDW: 13.8 % (ref 11.5–15.5)
WBC: 8.9 10*3/uL (ref 4.0–10.5)
nRBC: 0 % (ref 0.0–0.2)

## 2021-04-21 LAB — SARS CORONAVIRUS 2 (TAT 6-24 HRS): SARS Coronavirus 2: NEGATIVE

## 2021-04-21 LAB — SURGICAL PCR SCREEN
MRSA, PCR: NEGATIVE
Staphylococcus aureus: NEGATIVE

## 2021-04-21 LAB — APTT: aPTT: 31 seconds (ref 24–36)

## 2021-04-21 LAB — PROTIME-INR
INR: 1 (ref 0.8–1.2)
Prothrombin Time: 13.3 seconds (ref 11.4–15.2)

## 2021-04-21 LAB — TYPE AND SCREEN
ABO/RH(D): A POS
Antibody Screen: NEGATIVE

## 2021-04-21 NOTE — Progress Notes (Signed)
PCP - Marda Stalker, PA  Chest x-ray - Not indicated EKG - NOt indicated Stress Test - 15 - 20 years ago no f/u needed  Sleep Study - Denies  DM - Denies  ERAS Protcol - Yes PRE-SURGERY Ensure given   COVID TEST- 04/21/21  Anesthesia review: No  Patient denies shortness of breath, fever, cough and chest pain at PAT appointment   All instructions explained to the patient, with a verbal understanding of the material. Patient agrees to go over the instructions while at home for a better understanding. Patient also instructed to wear a mask while in public after being tested for COVID-19. The opportunity to ask questions was provided.

## 2021-04-22 DIAGNOSIS — F4310 Post-traumatic stress disorder, unspecified: Secondary | ICD-10-CM | POA: Diagnosis not present

## 2021-04-23 NOTE — Progress Notes (Signed)
Subjective: 65 year old female presents the office today for follow evaluation of right foot pain.  She said that she still gets some cramping of the foot.  Otherwise her symptoms are mostly unchanged along the area of the first and second MPJs.  Her heel pain is doing better currently and the pain is intermittent as well.  No recent injury or changes otherwise.   Objective: AAO x3, NAD DP/PT pulses palpable bilaterally, CRT less than 3 seconds On today's exam there is no tenderness palpation on plantar medial tubercle of the calcaneus at the insertion of plantar fascial.  Most of her tenderness that she gets along the second and first MPJ and this is where she gets the cramping sensation.  Not able to elicit any area of pinpoint tenderness.  Flexor, extensor tendons appear to be intact.  MMT 5/5. No pain with calf compression, swelling, warmth, erythema  Assessment: Right foot pain, capsulitis; plantar fasciitis  Plan: -All treatment options discussed with the patient including all alternatives, risks, complications.  -We again discussed with conservative as well as surgical treatment options.  She wants to continue with conservative treatment for now.  Discussed with her first MPJ arthrodesis as well as second metatarsal shortening osteotomy she is on consider her options the future.  For now continue shoes, good arch support as well as stretching, icing.  Trula Slade DPM

## 2021-04-24 ENCOUNTER — Inpatient Hospital Stay (HOSPITAL_COMMUNITY): Admission: RE | Admit: 2021-04-24 | Payer: PPO | Source: Ambulatory Visit | Admitting: Orthopedic Surgery

## 2021-04-24 ENCOUNTER — Encounter (HOSPITAL_COMMUNITY): Admission: RE | Payer: Self-pay | Source: Ambulatory Visit

## 2021-04-24 SURGERY — POSTERIOR LUMBAR FUSION 2 WITH HARDWARE REMOVAL
Anesthesia: General

## 2021-04-28 ENCOUNTER — Other Ambulatory Visit: Payer: Self-pay | Admitting: Orthopedic Surgery

## 2021-04-28 DIAGNOSIS — R6889 Other general symptoms and signs: Secondary | ICD-10-CM | POA: Diagnosis not present

## 2021-04-28 DIAGNOSIS — L749 Eccrine sweat disorder, unspecified: Secondary | ICD-10-CM | POA: Diagnosis not present

## 2021-04-28 DIAGNOSIS — R5383 Other fatigue: Secondary | ICD-10-CM | POA: Diagnosis not present

## 2021-04-29 ENCOUNTER — Encounter (HOSPITAL_COMMUNITY): Payer: Self-pay | Admitting: Orthopedic Surgery

## 2021-04-29 ENCOUNTER — Other Ambulatory Visit: Payer: Self-pay

## 2021-04-29 NOTE — Progress Notes (Signed)
DUE TO COVID-19 ONLY ONE VISITOR IS ALLOWED TO COME WITH YOU AND STAY IN THE WAITING ROOM ONLY DURING PRE OP AND PROCEDURE DAY OF SURGERY.   Two VISITORS MAY VISIT WITH YOU AFTER SURGERY IN YOUR PRIVATE ROOM DURING VISITING HOURS ONLY!  PCP - Marda Stalker, PA Cardiologist - n/a  Chest x-ray - n/a EKG - n/a Stress Test - >15-20 yrs ago, No F/U needed ECHO - n/a Cardiac Cath - n/a  ICD Pacemaker/Loop - n/a  Sleep Study -  n/a CPAP - none  ERAS: Clear liquids til 12:30 PM on DOS.  Clear liquids reviewed with patient.  Please complete your PRE-SURGERY ENSURE (that was provided to you at PAT appt. on 04/21/21) by 12:30 PM the morning of surgery.  Please, if able, drink it in one setting. DO NOT SIP.   STOP now taking any Aspirin (unless otherwise instructed by your surgeon), Aleve, Naproxen, Ibuprofen, Motrin, Advil, Goody's, BC's, all herbal medications, fish oil, and all vitamins.   Coronavirus Screening Covid test is scheduled on DOS 04/30/21  Do you have any of the following symptoms:  Cough yes/no: No Fever (>100.50F)  yes/no: No Runny nose yes/no: No Sore throat yes/no: No Difficulty breathing/shortness of breath  yes/no: No  Have you traveled in the last 14 days and where? yes/no: No  Patient verbalized understanding of instructions that were given via phone.

## 2021-04-30 ENCOUNTER — Inpatient Hospital Stay (HOSPITAL_COMMUNITY)
Admission: RE | Admit: 2021-04-30 | Discharge: 2021-04-30 | DRG: 509 | Disposition: A | Discharge status: Home / Self Care | Payer: PPO | Attending: Orthopedic Surgery | Admitting: Orthopedic Surgery

## 2021-04-30 ENCOUNTER — Ambulatory Visit (HOSPITAL_COMMUNITY)
Admission: RE | Disposition: A | Discharge status: Home / Self Care | Payer: Self-pay | Source: Home / Self Care | Attending: Orthopedic Surgery

## 2021-04-30 ENCOUNTER — Encounter (HOSPITAL_COMMUNITY): Payer: Self-pay | Admitting: Orthopedic Surgery

## 2021-04-30 ENCOUNTER — Inpatient Hospital Stay (HOSPITAL_COMMUNITY): Payer: PPO | Admitting: Anesthesiology

## 2021-04-30 ENCOUNTER — Other Ambulatory Visit: Payer: Self-pay

## 2021-04-30 ENCOUNTER — Inpatient Hospital Stay (HOSPITAL_COMMUNITY): Payer: PPO

## 2021-04-30 DIAGNOSIS — K219 Gastro-esophageal reflux disease without esophagitis: Secondary | ICD-10-CM | POA: Diagnosis not present

## 2021-04-30 DIAGNOSIS — Z981 Arthrodesis status: Secondary | ICD-10-CM | POA: Diagnosis not present

## 2021-04-30 DIAGNOSIS — Z9889 Other specified postprocedural states: Secondary | ICD-10-CM

## 2021-04-30 DIAGNOSIS — M797 Fibromyalgia: Secondary | ICD-10-CM | POA: Diagnosis not present

## 2021-04-30 DIAGNOSIS — Z888 Allergy status to other drugs, medicaments and biological substances status: Secondary | ICD-10-CM

## 2021-04-30 DIAGNOSIS — G8929 Other chronic pain: Secondary | ICD-10-CM | POA: Diagnosis not present

## 2021-04-30 DIAGNOSIS — F419 Anxiety disorder, unspecified: Secondary | ICD-10-CM | POA: Diagnosis present

## 2021-04-30 DIAGNOSIS — Z79899 Other long term (current) drug therapy: Secondary | ICD-10-CM | POA: Diagnosis not present

## 2021-04-30 DIAGNOSIS — M5412 Radiculopathy, cervical region: Secondary | ICD-10-CM | POA: Diagnosis present

## 2021-04-30 DIAGNOSIS — E559 Vitamin D deficiency, unspecified: Secondary | ICD-10-CM | POA: Diagnosis not present

## 2021-04-30 DIAGNOSIS — M4316 Spondylolisthesis, lumbar region: Secondary | ICD-10-CM | POA: Diagnosis not present

## 2021-04-30 DIAGNOSIS — Z472 Encounter for removal of internal fixation device: Secondary | ICD-10-CM | POA: Diagnosis not present

## 2021-04-30 DIAGNOSIS — J309 Allergic rhinitis, unspecified: Secondary | ICD-10-CM | POA: Diagnosis present

## 2021-04-30 DIAGNOSIS — Z419 Encounter for procedure for purposes other than remedying health state, unspecified: Secondary | ICD-10-CM

## 2021-04-30 DIAGNOSIS — Z8249 Family history of ischemic heart disease and other diseases of the circulatory system: Secondary | ICD-10-CM | POA: Diagnosis not present

## 2021-04-30 DIAGNOSIS — M48061 Spinal stenosis, lumbar region without neurogenic claudication: Secondary | ICD-10-CM | POA: Diagnosis not present

## 2021-04-30 DIAGNOSIS — G709 Myoneural disorder, unspecified: Secondary | ICD-10-CM | POA: Diagnosis not present

## 2021-04-30 DIAGNOSIS — T8484XA Pain due to internal orthopedic prosthetic devices, implants and grafts, initial encounter: Secondary | ICD-10-CM | POA: Diagnosis not present

## 2021-04-30 DIAGNOSIS — Z20822 Contact with and (suspected) exposure to covid-19: Secondary | ICD-10-CM | POA: Diagnosis not present

## 2021-04-30 DIAGNOSIS — Z825 Family history of asthma and other chronic lower respiratory diseases: Secondary | ICD-10-CM

## 2021-04-30 DIAGNOSIS — Y831 Surgical operation with implant of artificial internal device as the cause of abnormal reaction of the patient, or of later complication, without mention of misadventure at the time of the procedure: Secondary | ICD-10-CM | POA: Diagnosis present

## 2021-04-30 LAB — SARS CORONAVIRUS 2 BY RT PCR (HOSPITAL ORDER, PERFORMED IN ~~LOC~~ HOSPITAL LAB): SARS Coronavirus 2: NEGATIVE

## 2021-04-30 SURGERY — POSTERIOR LUMBAR FUSION 2 WITH HARDWARE REMOVAL
Anesthesia: General | Site: Spine Lumbar

## 2021-04-30 MED ORDER — LIDOCAINE 2% (20 MG/ML) 5 ML SYRINGE
INTRAMUSCULAR | Status: DC | PRN
  Administered 2021-04-30: 80 mg via INTRAVENOUS

## 2021-04-30 MED ORDER — THROMBIN 20000 UNITS EX SOLR
CUTANEOUS | Status: DC | PRN
  Administered 2021-04-30: 20000 [IU] via TOPICAL

## 2021-04-30 MED ORDER — CHLORHEXIDINE GLUCONATE 0.12 % MT SOLN
15.0000 mL | Freq: Once | OROMUCOSAL | Status: AC
  Administered 2021-04-30: 15 mL via OROMUCOSAL

## 2021-04-30 MED ORDER — CHLORHEXIDINE GLUCONATE 0.12 % MT SOLN
OROMUCOSAL | Status: AC
  Filled 2021-04-30: qty 15

## 2021-04-30 MED ORDER — CEFAZOLIN SODIUM-DEXTROSE 2-4 GM/100ML-% IV SOLN
2.0000 g | INTRAVENOUS | Status: AC
  Administered 2021-04-30: 2 g via INTRAVENOUS
  Filled 2021-04-30: qty 100

## 2021-04-30 MED ORDER — ONDANSETRON HCL 4 MG/2ML IJ SOLN
INTRAMUSCULAR | Status: AC
  Filled 2021-04-30: qty 2

## 2021-04-30 MED ORDER — FENTANYL CITRATE (PF) 250 MCG/5ML IJ SOLN
INTRAMUSCULAR | Status: AC
  Filled 2021-04-30: qty 5

## 2021-04-30 MED ORDER — ESMOLOL HCL 100 MG/10ML IV SOLN
INTRAVENOUS | Status: DC | PRN
  Administered 2021-04-30: 20 mg via INTRAVENOUS

## 2021-04-30 MED ORDER — ORAL CARE MOUTH RINSE: 15.0000 mL | Freq: Once | OROMUCOSAL | Status: AC

## 2021-04-30 MED ORDER — LIDOCAINE 2% (20 MG/ML) 5 ML SYRINGE
INTRAMUSCULAR | Status: AC
  Filled 2021-04-30: qty 5

## 2021-04-30 MED ORDER — METHOCARBAMOL 750 MG PO TABS: 750.0000 mg | ORAL_TABLET | Freq: Four times a day (QID) | ORAL | 2 refills | Status: DC | PRN

## 2021-04-30 MED ORDER — ACETAMINOPHEN 500 MG PO TABS
1000.0000 mg | ORAL_TABLET | Freq: Once | ORAL | Status: DC
  Filled 2021-04-30: qty 2

## 2021-04-30 MED ORDER — BUPIVACAINE LIPOSOME 1.3 % IJ SUSP
INTRAMUSCULAR | Status: AC
  Filled 2021-04-30: qty 20

## 2021-04-30 MED ORDER — PROPOFOL 10 MG/ML IV BOLUS
INTRAVENOUS | Status: AC
  Filled 2021-04-30: qty 20

## 2021-04-30 MED ORDER — FENTANYL CITRATE (PF) 100 MCG/2ML IJ SOLN
25.0000 ug | INTRAMUSCULAR | Status: DC | PRN
Start: 2021-04-30 — End: 2021-05-01

## 2021-04-30 MED ORDER — FENTANYL CITRATE (PF) 250 MCG/5ML IJ SOLN
INTRAMUSCULAR | Status: DC | PRN
  Administered 2021-04-30: 100 ug via INTRAVENOUS
  Administered 2021-04-30 (×3): 50 ug via INTRAVENOUS

## 2021-04-30 MED ORDER — THROMBIN (RECOMBINANT) 20000 UNITS EX SOLR
CUTANEOUS | Status: AC
  Filled 2021-04-30: qty 20000

## 2021-04-30 MED ORDER — PHENYLEPHRINE HCL-NACL 20-0.9 MG/250ML-% IV SOLN
INTRAVENOUS | Status: DC | PRN
  Administered 2021-04-30: 30 ug/min via INTRAVENOUS

## 2021-04-30 MED ORDER — LACTATED RINGERS IV SOLN: INTRAVENOUS | Status: DC

## 2021-04-30 MED ORDER — SUGAMMADEX SODIUM 200 MG/2ML IV SOLN
INTRAVENOUS | Status: DC | PRN
  Administered 2021-04-30: 250.4 mg via INTRAVENOUS

## 2021-04-30 MED ORDER — OXYCODONE HCL 5 MG PO TABS: 5.0000 mg | ORAL_TABLET | Freq: Once | ORAL | Status: DC | PRN

## 2021-04-30 MED ORDER — ROCURONIUM BROMIDE 10 MG/ML (PF) SYRINGE
PREFILLED_SYRINGE | INTRAVENOUS | Status: AC
  Filled 2021-04-30: qty 10

## 2021-04-30 MED ORDER — POVIDONE-IODINE 7.5 % EX SOLN
Freq: Once | CUTANEOUS | Status: DC
  Filled 2021-04-30: qty 118

## 2021-04-30 MED ORDER — ESMOLOL HCL 100 MG/10ML IV SOLN
INTRAVENOUS | Status: AC
  Filled 2021-04-30: qty 10

## 2021-04-30 MED ORDER — PROPOFOL 10 MG/ML IV BOLUS
INTRAVENOUS | Status: DC | PRN
  Administered 2021-04-30: 130 mg via INTRAVENOUS

## 2021-04-30 MED ORDER — ACETAMINOPHEN 500 MG PO TABS
1000.0000 mg | ORAL_TABLET | Freq: Once | ORAL | Status: AC
  Administered 2021-04-30: 1000 mg via ORAL

## 2021-04-30 MED ORDER — PHENYLEPHRINE HCL (PRESSORS) 10 MG/ML IV SOLN
INTRAVENOUS | Status: DC | PRN
  Administered 2021-04-30 (×3): 80 ug via INTRAVENOUS

## 2021-04-30 MED ORDER — OXYCODONE HCL 5 MG/5ML PO SOLN: 5.0000 mg | Freq: Once | ORAL | Status: DC | PRN

## 2021-04-30 MED ORDER — BACITRACIN ZINC 500 UNIT/GM EX OINT
TOPICAL_OINTMENT | CUTANEOUS | Status: AC
  Filled 2021-04-30: qty 28.35

## 2021-04-30 MED ORDER — ONDANSETRON HCL 4 MG/2ML IJ SOLN
INTRAMUSCULAR | Status: DC | PRN
  Administered 2021-04-30: 4 mg via INTRAVENOUS

## 2021-04-30 MED ORDER — ROCURONIUM BROMIDE 10 MG/ML (PF) SYRINGE
PREFILLED_SYRINGE | INTRAVENOUS | Status: DC | PRN
  Administered 2021-04-30: 70 mg via INTRAVENOUS

## 2021-04-30 MED ORDER — BUPIVACAINE-EPINEPHRINE (PF) 0.25% -1:200000 IJ SOLN
INTRAMUSCULAR | Status: AC
  Filled 2021-04-30: qty 30

## 2021-04-30 MED ORDER — HYDROMORPHONE HCL 2 MG PO TABS: 1.0000 mg | ORAL_TABLET | ORAL | 0 refills | Status: AC | PRN

## 2021-04-30 MED ORDER — DEXAMETHASONE SODIUM PHOSPHATE 10 MG/ML IJ SOLN
INTRAMUSCULAR | Status: AC
  Filled 2021-04-30: qty 1

## 2021-04-30 MED ORDER — DEXAMETHASONE SODIUM PHOSPHATE 10 MG/ML IJ SOLN
INTRAMUSCULAR | Status: DC | PRN
  Administered 2021-04-30: 10 mg via INTRAVENOUS

## 2021-04-30 MED ORDER — BUPIVACAINE LIPOSOME 1.3 % IJ SUSP
INTRAMUSCULAR | Status: DC | PRN
  Administered 2021-04-30: 10 mL

## 2021-04-30 MED ORDER — BUPIVACAINE-EPINEPHRINE 0.25% -1:200000 IJ SOLN
INTRAMUSCULAR | Status: DC | PRN
  Administered 2021-04-30: 10 mL
  Administered 2021-04-30: 5 mL

## 2021-04-30 MED ORDER — AMISULPRIDE (ANTIEMETIC) 5 MG/2ML IV SOLN: 10.0000 mg | Freq: Once | INTRAVENOUS | Status: DC | PRN

## 2021-04-30 MED ORDER — ONDANSETRON HCL 4 MG/2ML IJ SOLN: 4.0000 mg | Freq: Once | INTRAMUSCULAR | Status: DC | PRN

## 2021-04-30 SURGICAL SUPPLY — 59 items
BAG COUNTER SPONGE SURGICOUNT (BAG) ×2 IMPLANT
BENZOIN TINCTURE PRP APPL 2/3 (GAUZE/BANDAGES/DRESSINGS) ×2 IMPLANT
CARTRIDGE OIL MAESTRO DRILL (MISCELLANEOUS) ×1 IMPLANT
CORD BIPOLAR FORCEPS 12FT (ELECTRODE) ×2 IMPLANT
COVER SURGICAL LIGHT HANDLE (MISCELLANEOUS) ×2 IMPLANT
DIFFUSER DRILL AIR PNEUMATIC (MISCELLANEOUS) ×2 IMPLANT
DRAPE C-ARM 35X43 STRL (DRAPES) IMPLANT
DRAPE C-ARM 42X72 X-RAY (DRAPES) ×2 IMPLANT
DRAPE ORTHO SPLIT 77X108 STRL (DRAPES) ×1
DRAPE POUCH INSTRU U-SHP 10X18 (DRAPES) ×2 IMPLANT
DRAPE SURG 17X23 STRL (DRAPES) ×6 IMPLANT
DRAPE SURG ORHT 6 SPLT 77X108 (DRAPES) ×1 IMPLANT
DURAPREP 26ML APPLICATOR (WOUND CARE) ×2 IMPLANT
ELECT BLADE 4.0 EZ CLEAN MEGAD (MISCELLANEOUS) ×2
ELECT CAUTERY BLADE 6.4 (BLADE) ×2 IMPLANT
ELECT REM PT RETURN 9FT ADLT (ELECTROSURGICAL) ×2
ELECTRODE BLDE 4.0 EZ CLN MEGD (MISCELLANEOUS) ×1 IMPLANT
ELECTRODE REM PT RTRN 9FT ADLT (ELECTROSURGICAL) ×1 IMPLANT
GAUZE 4X4 16PLY ~~LOC~~+RFID DBL (SPONGE) ×4 IMPLANT
GAUZE SPONGE 4X4 12PLY STRL (GAUZE/BANDAGES/DRESSINGS) ×2 IMPLANT
GLOVE SRG 8 PF TXTR STRL LF DI (GLOVE) ×1 IMPLANT
GLOVE SURG ENC MOIS LTX SZ8 (GLOVE) ×2 IMPLANT
GLOVE SURG UNDER POLY LF SZ8 (GLOVE) ×1
GOWN STRL REUS W/ TWL LRG LVL3 (GOWN DISPOSABLE) ×2 IMPLANT
GOWN STRL REUS W/ TWL XL LVL3 (GOWN DISPOSABLE) ×1 IMPLANT
GOWN STRL REUS W/TWL LRG LVL3 (GOWN DISPOSABLE) ×2
GOWN STRL REUS W/TWL XL LVL3 (GOWN DISPOSABLE) ×1
IV CATH 14GX2 1/4 (CATHETERS) ×2 IMPLANT
KIT BASIN OR (CUSTOM PROCEDURE TRAY) ×2 IMPLANT
KIT POSITION SURG JACKSON T1 (MISCELLANEOUS) ×2 IMPLANT
KIT TURNOVER KIT B (KITS) ×2 IMPLANT
MARKER SKIN DUAL TIP RULER LAB (MISCELLANEOUS) ×2 IMPLANT
NEEDLE HYPO 25GX1X1/2 BEV (NEEDLE) ×2 IMPLANT
NEEDLE SPNL 18GX3.5 QUINCKE PK (NEEDLE) ×2 IMPLANT
NS IRRIG 1000ML POUR BTL (IV SOLUTION) ×2 IMPLANT
OIL CARTRIDGE MAESTRO DRILL (MISCELLANEOUS) ×2
PACK LAMINECTOMY ORTHO (CUSTOM PROCEDURE TRAY) ×2 IMPLANT
PACK UNIVERSAL I (CUSTOM PROCEDURE TRAY) ×4 IMPLANT
PAD ARMBOARD 7.5X6 YLW CONV (MISCELLANEOUS) ×4 IMPLANT
PATTIES SURGICAL .5 X1 (DISPOSABLE) ×2 IMPLANT
PATTIES SURGICAL .5X1.5 (GAUZE/BANDAGES/DRESSINGS) ×2 IMPLANT
SLEEVE SCD COMPRESS KNEE MED (STOCKING) ×2 IMPLANT
SPONGE INTESTINAL PEANUT (DISPOSABLE) ×4 IMPLANT
SPONGE SURGIFOAM ABS GEL 100 (HEMOSTASIS) ×2 IMPLANT
STRIP CLOSURE SKIN 1/2X4 (GAUZE/BANDAGES/DRESSINGS) ×2 IMPLANT
SUT MNCRL AB 4-0 PS2 18 (SUTURE) ×2 IMPLANT
SUT VIC AB 0 CT1 18XCR BRD 8 (SUTURE) ×1 IMPLANT
SUT VIC AB 0 CT1 8-18 (SUTURE) ×1
SUT VIC AB 1 CT1 18XCR BRD 8 (SUTURE) ×2 IMPLANT
SUT VIC AB 1 CT1 8-18 (SUTURE) ×2
SUT VIC AB 2-0 CT2 18 VCP726D (SUTURE) ×6 IMPLANT
SYR 20ML LL LF (SYRINGE) ×2 IMPLANT
SYR BULB IRRIG 60ML STRL (SYRINGE) ×2 IMPLANT
SYR CONTROL 10ML LL (SYRINGE) ×4 IMPLANT
SYR TB 1ML LUER SLIP (SYRINGE) ×2 IMPLANT
TOWEL GREEN STERILE (TOWEL DISPOSABLE) ×2 IMPLANT
TOWEL GREEN STERILE FF (TOWEL DISPOSABLE) ×2 IMPLANT
WATER STERILE IRR 1000ML POUR (IV SOLUTION) ×2 IMPLANT
YANKAUER SUCT BULB TIP NO VENT (SUCTIONS) ×2 IMPLANT

## 2021-04-30 NOTE — Op Note (Signed)
PATIENT NAME: [facility] RECORD NO.:   [id number]    DATE OF BIRTH: 1956-01-19   DATE OF PROCEDURE: 04/30/2021                                OPERATIVE REPORT     PREOPERATIVE DIAGNOSES: 1.  Possible painful hardware, L3-S1   POSTOPERATIVE DIAGNOSES:   1.  Possible painful hardware, L3-S1   PROCEDURE: 1. Removal of posterior instrumentation, L3, L4, L5, S1 2. Exploration of spinal fusion L3-4, L4-5, L5-S1 3. Intraoperative use of floroscopy   SURGEON:  Phylliss Bob, MD   ASSISTANT:  None   ANESTHESIA:  General endotracheal anesthesia.   COMPLICATIONS:  None.   DISPOSITION:  Stable.   ESTIMATED BLOOD LOSS:  Minimal   INDICATIONS FOR SURGERY: Briefly, Ms. Gaccione has undergone a posterior fusion procedure in the past, with instrumentation spanning L3-S1.  She has been having pain in her low back.  The patient has concerns that the hardware currently is in place from L3-S1 is causing her pain.  I let her know that this may or may not be the case, but I do feel that it would be reasonable to proceed with her request, to remove her posterior spinal fixation from L3-S1.  She does present today for that procedure.  She does understand that removing the hardware may or may not alleviate her pain, and that as with any surgery, the risks of hardware removal, such as infection.  She does wish to proceed.   OPERATIVE DETAILS:  On 04/20/2021, the patient was brought to surgery and general endotracheal anesthesia was administered.  The patient was placed prone onto a Jackson spinal bed.  The back was then prepped and draped in the usual sterile fashion.  I then made paramedian incisions on the right and left sides, just lateral to the hardware spanning L3-4, L4-5 and L5-S1.  Bilaterally, the bilateral hardware was identified.  The caps overlying the L3, L4, L5, and S1 pedicle screws were removed. The interconnecting rods were removed bilaterally, and finally, the pedicle screws  bilaterally from L3-S1 were removed uneventfully.  I did explore the fusion mass on the left side, and a successful fusion was noted.  Prior to removing the pedicle screws, I did firmly grasp the posterior aspect of the pedicle screws, and did perform a pushing and pulling maneuver at each intervertebral level, and there was no motion noted, confirming a successful fusion.  Bone wax was placed in the holes that remained after removal of the pedicle screws.  The wound was then copiously irrigated.  On the right and left sides, the fascia was closed using #1 Vicryl.  The subcutaneous layer was closed using 0 Vicryl followed by 2-0 Vicryl, and the skin was then closed using 4-0 Monocryl. Benzoin and Steri-Strips were applied followed by sterile dressing.  All instrument counts were correct at the termination of the procedure.      Phylliss Bob, MD

## 2021-04-30 NOTE — Anesthesia Procedure Notes (Signed)
Procedure Name: Intubation Date/Time: 04/30/2021 4:42 PM Performed by: Maude Leriche, CRNA Pre-anesthesia Checklist: Patient identified, Emergency Drugs available, Suction available and Patient being monitored Patient Re-evaluated:Patient Re-evaluated prior to induction Oxygen Delivery Method: Circle system utilized Preoxygenation: Pre-oxygenation with 100% oxygen Induction Type: IV induction Ventilation: Mask ventilation without difficulty Laryngoscope Size: Miller and 2 Grade View: Grade I Tube type: Oral Tube size: 7.0 mm Number of attempts: 1 Airway Equipment and Method: Stylet Placement Confirmation: ETT inserted through vocal cords under direct vision, positive ETCO2 and breath sounds checked- equal and bilateral Secured at: 21 cm Tube secured with: Tape Dental Injury: Teeth and Oropharynx as per pre-operative assessment

## 2021-04-30 NOTE — Anesthesia Preprocedure Evaluation (Addendum)
Anesthesia Evaluation  Patient identified by MRN, date of birth, ID band Patient awake    Reviewed: Allergy & Precautions, NPO status , Patient's Chart, lab work & pertinent test results  Airway Mallampati: II  TM Distance: >3 FB Neck ROM: Full    Dental  (+) Teeth Intact, Chipped, Missing,    Pulmonary neg pulmonary ROS,    Pulmonary exam normal breath sounds clear to auscultation       Cardiovascular negative cardio ROS Normal cardiovascular exam Rhythm:Regular Rate:Normal     Neuro/Psych  Headaches, PSYCHIATRIC DISORDERS Anxiety Depression Cervical radiculopathy  Neuromuscular disease    GI/Hepatic Neg liver ROS, GERD  ,  Endo/Other  negative endocrine ROS  Renal/GU negative Renal ROS  negative genitourinary   Musculoskeletal  (+) Arthritis , Osteoarthritis,  Fibromyalgia -  Abdominal   Peds negative pediatric ROS (+)  Hematology negative hematology ROS (+)   Anesthesia Other Findings   Reproductive/Obstetrics                            Anesthesia Physical  Anesthesia Plan  ASA: 2  Anesthesia Plan: General   Post-op Pain Management:    Induction: Intravenous  PONV Risk Score and Plan: 4 or greater and Ondansetron, Dexamethasone, Midazolam and Treatment may vary due to age or medical condition  Airway Management Planned: Oral ETT, LMA and Video Laryngoscope Planned  Additional Equipment: None  Intra-op Plan:   Post-operative Plan: Extubation in OR  Informed Consent: I have reviewed the patients History and Physical, chart, labs and discussed the procedure including the risks, benefits and alternatives for the proposed anesthesia with the patient or authorized representative who has indicated his/her understanding and acceptance.     Dental advisory given  Plan Discussed with: CRNA and Anesthesiologist  Anesthesia Plan Comments:         Anesthesia Quick  Evaluation

## 2021-04-30 NOTE — Transfer of Care (Signed)
Immediate Anesthesia Transfer of Care Note  Patient: Sharon Logan  Procedure(s) Performed: REMOVAL OF LUMBAR FUSION HARDWARE LUMBAR 3 - LUMBAR 4, LUMBAR 4- LUMBAR 5, LUMBAR 5 - SACRUM 1 (Spine Lumbar)  Patient Location: PACU  Anesthesia Type:General  Level of Consciousness: awake, alert  and oriented  Airway & Oxygen Therapy: Patient Spontanous Breathing and Patient connected to face mask oxygen  Post-op Assessment: Report given to RN, Post -op Vital signs reviewed and stable, Patient moving all extremities X 4 and Patient able to stick tongue midline  Post vital signs: stable  Last Vitals:  Vitals Value Taken Time  BP 137/77 04/30/21 1825  Temp 36.8 C 04/30/21 1825  Pulse 67 04/30/21 1827  Resp 6 04/30/21 1827  SpO2 83 % 04/30/21 1827  Vitals shown include unvalidated device data.  Last Pain:  Vitals:   04/30/21 1256  TempSrc:   PainSc: 4       Patients Stated Pain Goal: 1 (67/61/95 0932)  Complications: No notable events documented.

## 2021-04-30 NOTE — H&P (Signed)
PREOPERATIVE H&P  Chief Complaint: Low back pain  HPI: Sharon Logan is a 65 y.o. female who presents with ongoing pain in the low back. The patient believes her pain is from her hardware and would like to have her hardware removed.    (see office notes for additional details regarding the patient's full course of treatment)  Past Medical History:  Diagnosis Date   Allergic rhinitis    Anxiety    Arthritis    Cancer ([place])    skin   Chronic pain    Chronically dry eyes    Depression    Fibromyalgia    GERD (gastroesophageal reflux disease)    Headache    Osteoporosis    Vitamin D deficiency    Wears glasses    Past Surgical History:  Procedure Laterality Date   ABDOMINAL HYSTERECTOMY     BACK SURGERY     BUNIONECTOMY Bilateral    EYE SURGERY     laser surgery   NASAL SINUS SURGERY     x2   SACROILIAC JOINT FUSION Bilateral 09/12/2020   Procedure: REMOVAL OF BILATERAL PELVIC SCREWS;  Surgeon: Phylliss Bob, MD;  Location: [facility];  Service: Orthopedics;  Laterality: Bilateral;   SKIN CANCER EXCISION     face x3   TUBAL LIGATION     Social History   Socioeconomic History   Marital status: Widowed    Spouse name: Not on file   Number of children: 2   Years of education: Not on file   Highest education level: Not on file  Occupational History   Not on file  Tobacco Use   Smoking status: Never   Smokeless tobacco: Never  Vaping Use   Vaping Use: Never used  Substance and Sexual Activity   Alcohol use: No   Drug use: No   Sexual activity: Not Currently    Birth control/protection: Surgical    Comment: Hysterectomy  Other Topics Concern   Not on file  Social History Narrative   Right handed   Lives in a one story home   Drinks Tea   Social Determinants of Health   Financial Resource Strain: Not on file  Food Insecurity: Not on file  Transportation Needs: Not on file  Physical Activity: Not on file  Stress: Not on file  Social Connections: Not  on file   Family History  Problem Relation Age of Onset   Depression Mother    Heart attack Mother    COPD Mother    Depression Father    Heart attack Father    ADD / ADHD Son    Allergies  Allergen Reactions   Gabapentin Other (See Comments)    Delirium, stroke like symptoms    Polymyxin B Other (See Comments)    Eyes go blood red    Zolpidem Rash    "sleep walk"    Cymbalta [Duloxetine Hcl] Other (See Comments)    Headaches, constipation   Fluoxetine Other (See Comments)    CONSTIPATION    Lamotrigine Rash   Other Other (See Comments)    OTOBIOTIC > RED EYES   Pregabalin Rash    Itchy red rash on chest    Prior to Admission medications   Medication Sig Start Date End Date Taking? Authorizing Provider  acetaminophen (TYLENOL) 325 MG tablet Take 650 mg by mouth every 6 (six) hours as needed for moderate pain.    [provider]  alendronate (FOSAMAX) 70 MG tablet Take  70 mg by mouth every Saturday. 04/08/21   [provider]  amitriptyline (ELAVIL) 25 MG tablet Take 1 tablet (25 mg total) by mouth at bedtime. 04/10/21 05/10/21  Thayer Headings, PMHNP  b complex vitamins capsule Take 2 capsules by mouth daily.    [provider]  baclofen (LIORESAL) 10 MG tablet Take 10 mg by mouth every 8 (eight) hours as needed for muscle spasms. 02/09/20   [provider]  Carboxymethylcellulose Sodium (THERATEARS OP) Apply 1 drop to eye 4 (four) times daily as needed (dry eyes).    [provider]  cetirizine (ZYRTEC) 10 MG tablet Take 10 mg by mouth daily as needed for allergies.    [provider]  cholecalciferol (VITAMIN D) 25 MCG (1000 UNIT) tablet Take 2,000 Units by mouth in the morning.    [provider]  DRYSOL 20 % external solution Apply 1 application topically daily as needed (sweat). 11/15/20   [provider]  Erenumab-aooe (AIMOVIG) 70 MG/ML SOAJ Inject 70 mg into the skin every 28 (twenty-eight)  days. Patient not taking: No sig reported 02/19/21   Pieter Partridge, DO  fluticasone (FLONASE) 50 MCG/ACT nasal spray Place 2 sprays into both nostrils 2 (two) times daily as needed for allergies or rhinitis.    [provider]  levothyroxine (SYNTHROID) 50 MCG tablet Take 1 tablet (50 mcg total) by mouth daily before breakfast. Patient not taking: No sig reported 12/24/20   Raulkar, Clide Deutscher, MD  nitroGLYCERIN (NITRO-DUR) 0.2 mg/hr patch Place 1 patch (0.2 mg total) onto the skin daily. Patient taking differently: Place 0.2 mg onto the skin daily as needed (bunion flare). 11/26/20   Trula Slade, DPM  oxyCODONE-acetaminophen (PERCOCET) 10-325 MG tablet Take 1 tablet by mouth 2 (two) times daily as needed for pain. 03/10/21   [provider]  rizatriptan (MAXALT) 10 MG tablet Take 10 mg by mouth daily as needed for migraine.  09/08/18   [provider]  saccharomyces boulardii (FLORASTOR) 250 MG capsule Take 500 mg by mouth daily.    [provider]     All other systems have been reviewed and were otherwise negative with the exception of those mentioned in the HPI and as above.  Physical Exam: There were no vitals filed for this visit.  Body mass index is 23.8 kg/m.  General: Alert, no acute distress Cardiovascular: No pedal edema Respiratory: No cyanosis, no use of accessory musculature Skin: No lesions in the area of chief complaint Neurologic: Sensation intact distally Psychiatric: Patient is competent for consent with normal mood and affect Lymphatic: No axillary or cervical lymphadenopathy   Assessment/Plan: POSSIBLE PAINFUL HARDWARE  Plan for Procedure(s): REMOVAL OF LUMBAR FUSION HARDWARE LUMBAR 3 - LUMBAR 4, LUMBAR 4- LUMBAR 5, LUMBAR 5 - SACRUM 1   Norva Karvonen, MD 04/30/2021 8:51 AM

## 2021-05-01 MED FILL — Thrombin (Recombinant) For Soln 20000 Unit: CUTANEOUS | Qty: 1 | Status: AC

## 2021-05-05 ENCOUNTER — Telehealth: Payer: Self-pay | Admitting: Psychiatry

## 2021-05-05 DIAGNOSIS — G894 Chronic pain syndrome: Secondary | ICD-10-CM | POA: Diagnosis not present

## 2021-05-05 DIAGNOSIS — G4700 Insomnia, unspecified: Secondary | ICD-10-CM

## 2021-05-05 DIAGNOSIS — M961 Postlaminectomy syndrome, not elsewhere classified: Secondary | ICD-10-CM | POA: Diagnosis not present

## 2021-05-05 DIAGNOSIS — F331 Major depressive disorder, recurrent, moderate: Secondary | ICD-10-CM

## 2021-05-05 DIAGNOSIS — F4310 Post-traumatic stress disorder, unspecified: Secondary | ICD-10-CM

## 2021-05-05 DIAGNOSIS — M5136 Other intervertebral disc degeneration, lumbar region: Secondary | ICD-10-CM | POA: Diagnosis not present

## 2021-05-05 MED ORDER — AMITRIPTYLINE HCL 50 MG PO TABS: 50.0000 mg | ORAL_TABLET | Freq: Every day | ORAL | 0 refills | Status: DC

## 2021-05-05 NOTE — Telephone Encounter (Signed)
Pt called and said that she would like to increase her amitriptyline to 50 mg.. Is this ok?

## 2021-05-05 NOTE — Telephone Encounter (Signed)
Pt stated you discussed this at last visit.She wants to go ahead and increase so that she can update you on how she's doing at her next visit.

## 2021-05-05 NOTE — Telephone Encounter (Signed)
LVM with info

## 2021-05-05 NOTE — Telephone Encounter (Signed)
Ok to increase to 50 mg dose. Script sent to her pharmacy.

## 2021-05-05 NOTE — Anesthesia Postprocedure Evaluation (Signed)
Anesthesia Post Note  Patient: Sharon Logan  Procedure(s) Performed: REMOVAL OF LUMBAR FUSION HARDWARE LUMBAR 3 - LUMBAR 4, LUMBAR 4- LUMBAR 5, LUMBAR 5 - SACRUM 1 (Spine Lumbar)     Patient location during evaluation: PACU Anesthesia Type: General Level of consciousness: awake and alert Pain management: pain level controlled Vital Signs Assessment: post-procedure vital signs reviewed and stable Respiratory status: spontaneous breathing, nonlabored ventilation, respiratory function stable and patient connected to nasal cannula oxygen Cardiovascular status: blood pressure returned to baseline and stable Postop Assessment: no apparent nausea or vomiting Anesthetic complications: no   No notable events documented.  Last Vitals:  Vitals:   04/30/21 1855 04/30/21 1910  BP: (!) 144/69 135/75  Pulse: 81 78  Resp: 17 16  Temp:  36.8 C  SpO2: 100% 100%    Last Pain:  Vitals:   04/30/21 1855  TempSrc:   PainSc: [place]

## 2021-05-06 DIAGNOSIS — M25561 Pain in right knee: Secondary | ICD-10-CM | POA: Diagnosis not present

## 2021-05-06 DIAGNOSIS — R1032 Left lower quadrant pain: Secondary | ICD-10-CM | POA: Diagnosis not present

## 2021-05-06 DIAGNOSIS — R300 Dysuria: Secondary | ICD-10-CM | POA: Diagnosis not present

## 2021-05-07 ENCOUNTER — Other Ambulatory Visit: Payer: Self-pay | Admitting: Family Medicine

## 2021-05-07 ENCOUNTER — Other Ambulatory Visit: Payer: Self-pay

## 2021-05-07 ENCOUNTER — Ambulatory Visit
Admission: RE | Admit: 2021-05-07 | Discharge: 2021-05-07 | Disposition: A | Discharge status: Home / Self Care | Payer: PPO | Source: Ambulatory Visit | Attending: Family Medicine | Admitting: Family Medicine

## 2021-05-07 DIAGNOSIS — M1711 Unilateral primary osteoarthritis, right knee: Secondary | ICD-10-CM | POA: Diagnosis not present

## 2021-05-07 DIAGNOSIS — R52 Pain, unspecified: Secondary | ICD-10-CM

## 2021-05-07 DIAGNOSIS — R1032 Left lower quadrant pain: Secondary | ICD-10-CM

## 2021-05-07 IMAGING — CR DG KNEE COMPLETE 4+V*R*
4 series · 4 of 4 positions shown · non-contrast
Comparison: None.

CLINICAL DATA: 65-year-old female with a history of pain for 3
months

EXAM:
RIGHT KNEE - COMPLETE 4+ VIEW

[w knee ap right]
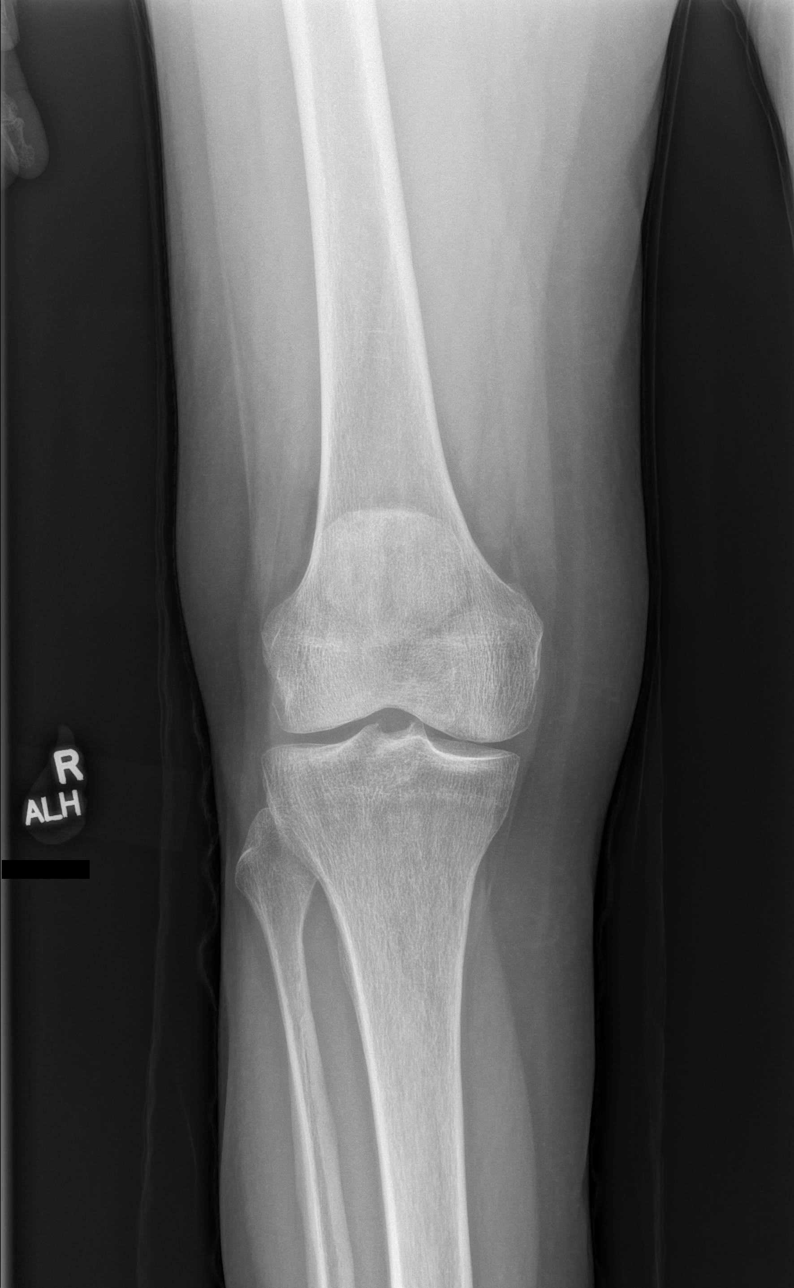

[w knee lat right]
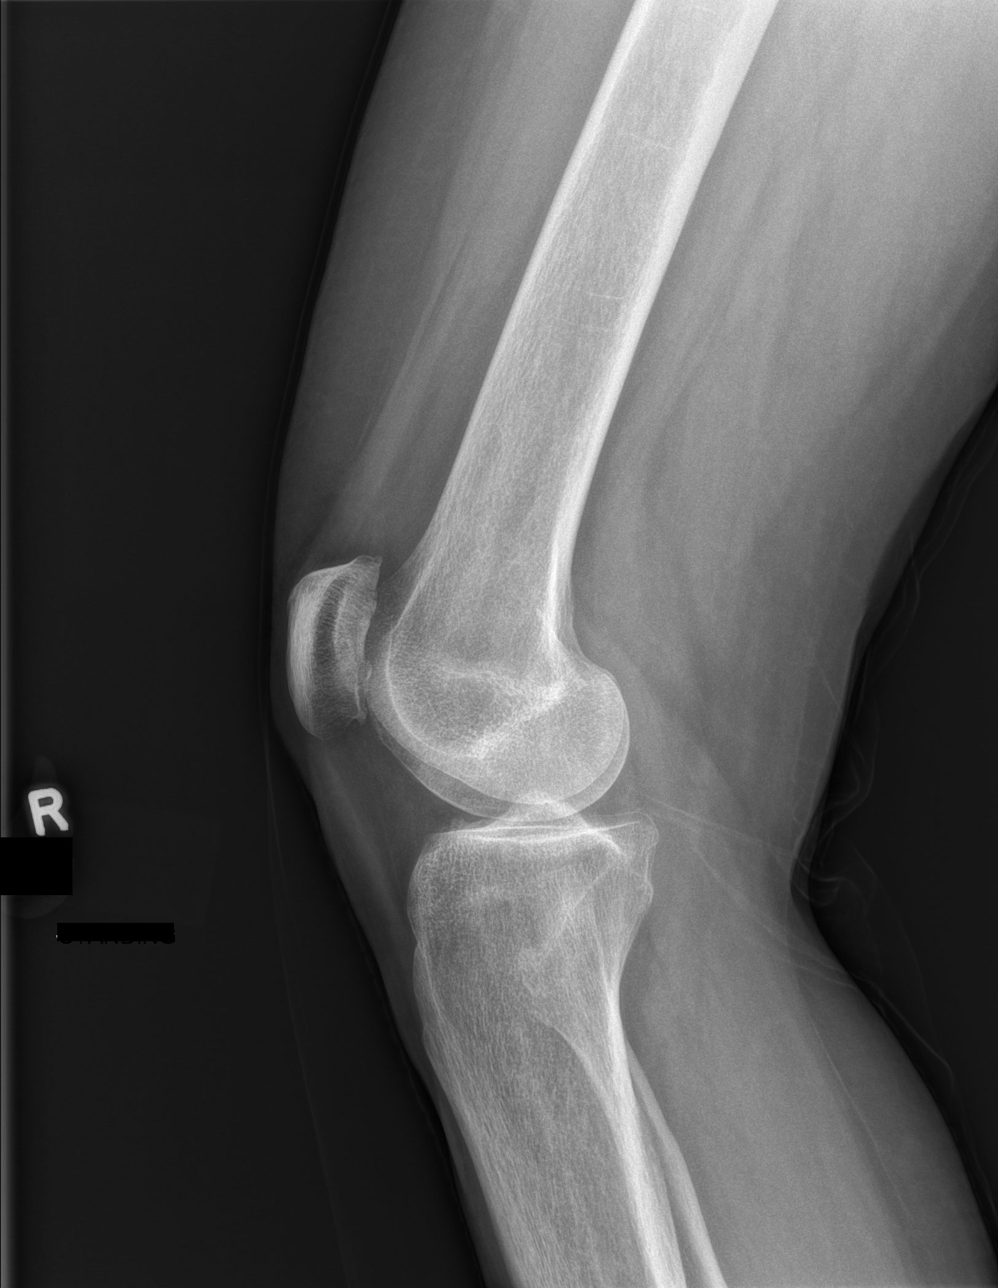

[w knee tunnel pa right]
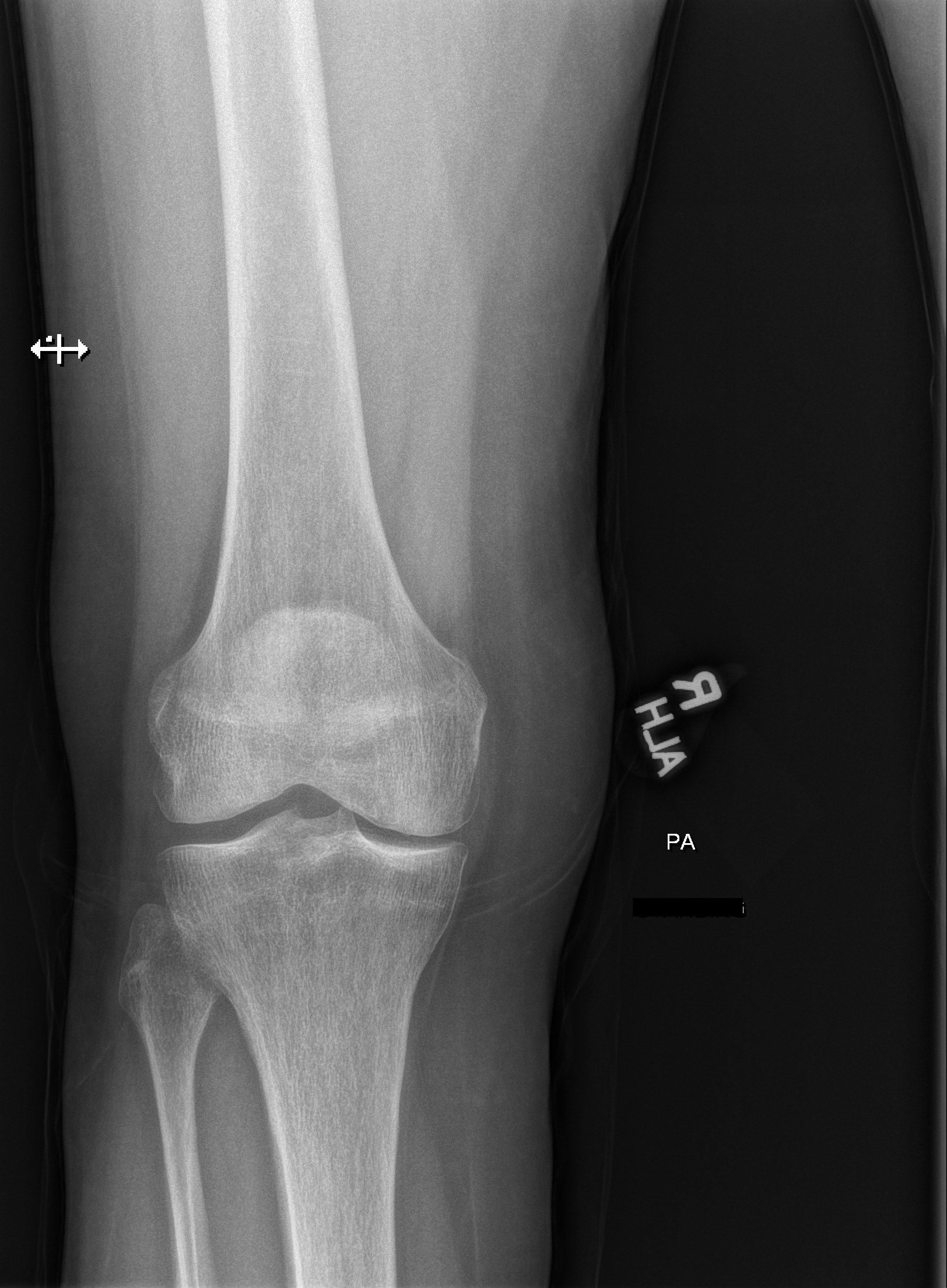

[x knee sunrise right]
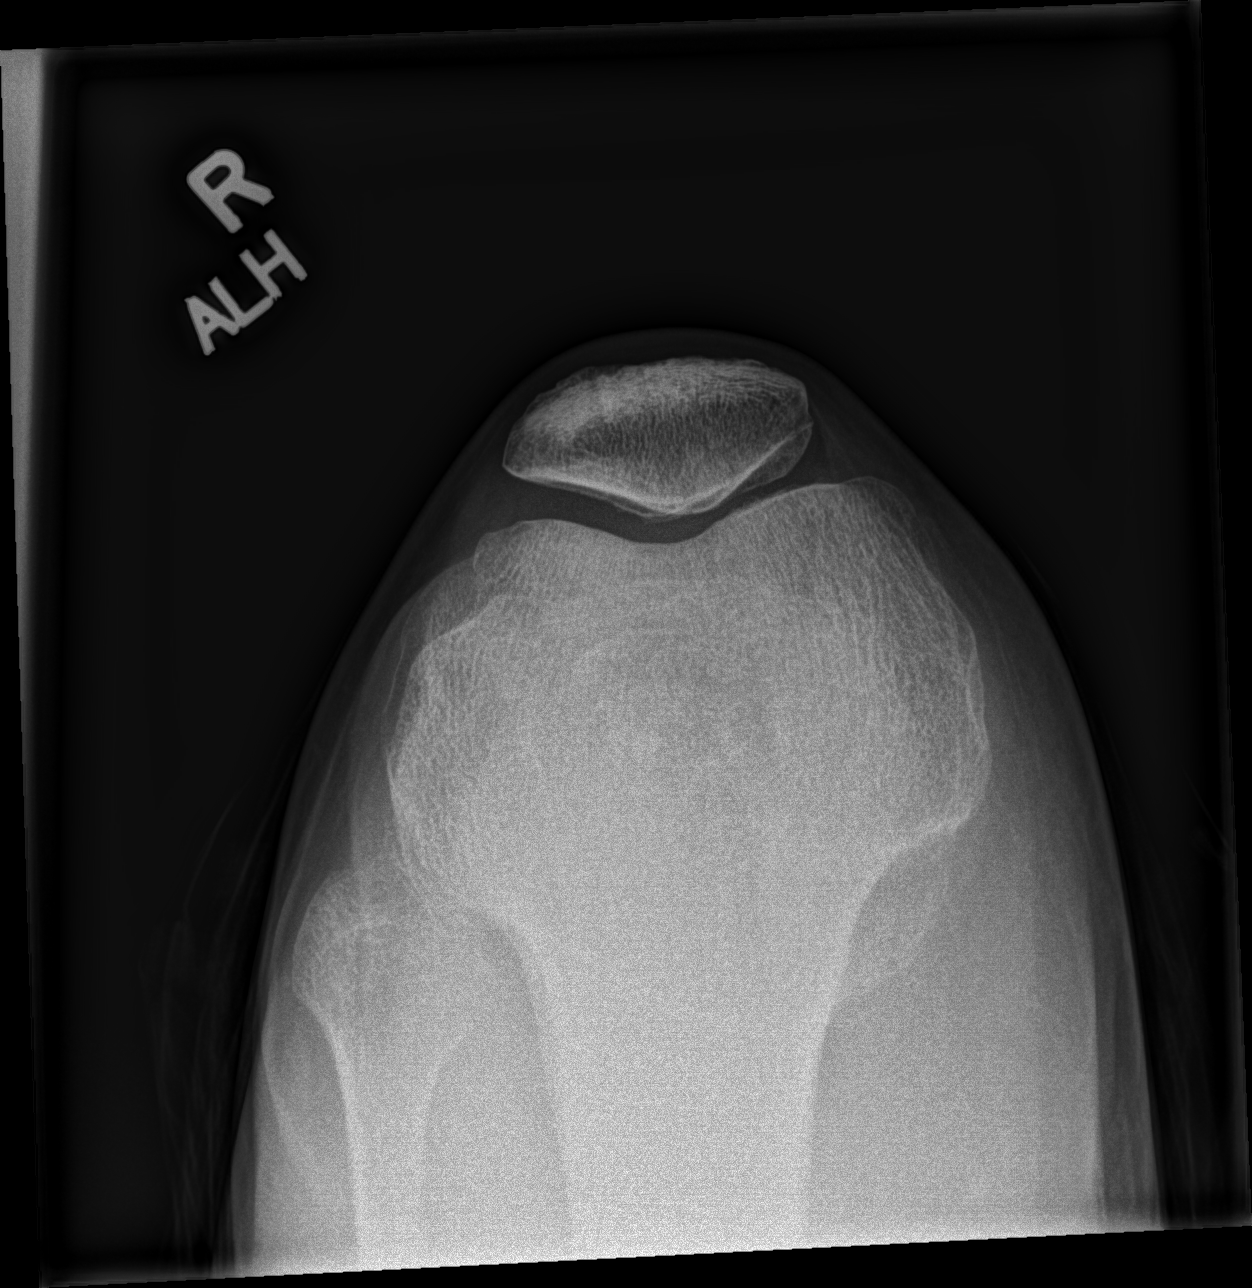

[4 of 4 positions shown; findings below may reference images not displayed]

FINDINGS: No acute displaced fracture. Minimal degenerative changes at the
patellofemoral joint. Medial greater than lateral joint space
narrowing without marginal osteophyte formation.

No radiopaque foreign body.  No joint effusion.
IMPRESSION: Negative for acute bony abnormality.

Mild osteoarthritis

## 2021-05-15 NOTE — Discharge Summary (Signed)
Patient ID: Sharon Logan MRN: [id number] DOB/AGE: Jan 26, 1956 65 y.o.  Admit date: 04/30/2021 Discharge date: 04/30/2021  Admission Diagnoses: Potentially Painful Hardware  Discharge Diagnoses: Removal of potentially painful hardware  Past Medical History:  Diagnosis Date   Allergic rhinitis    Anxiety    Arthritis    Cancer ([place])    skin   Chronic pain    Chronically dry eyes    Depression    Fibromyalgia    GERD (gastroesophageal reflux disease)    Headache    Osteoporosis    Vitamin D deficiency    Wears glasses     Surgeries: Procedure(s): REMOVAL OF LUMBAR FUSION HARDWARE LUMBAR 3 - LUMBAR 4, LUMBAR 4- LUMBAR 5, LUMBAR 5 - SACRUM 1 on 04/30/2021   Consultants: None  Discharged Condition: Improved  Hospital Course: Sharon Logan is an 65 y.o. female who was admitted 04/30/2021 for operative treatment of painful hardware. Patient has severe unremitting pain that affects sleep, daily activities, and work/hobbies. After pre-op clearance the patient was taken to the operating room on 04/30/2021 and underwent  Procedure(s): REMOVAL OF LUMBAR FUSION HARDWARE LUMBAR 3 - LUMBAR 4, LUMBAR 4- LUMBAR 5, LUMBAR 5 - SACRUM 1.    Patient was given perioperative antibiotics:  Anti-infectives (From admission, onward)    Start     Dose/Rate Route Frequency Ordered Stop   05/01/21 0600  ceFAZolin (ANCEF) IVPB 2g/100 mL premix        2 g 200 mL/hr over 30 Minutes Intravenous On call to O.R. 04/30/21 1249 04/30/21 1645        Patient was given sequential compression devices, early ambulation to prevent DVT.  Patient benefited maximally from hospital stay and there were no complications.    Recent vital signs: BP 135/75 (BP Location: Right Arm)   Pulse 78   Temp 98.3 F (36.8 C)   Resp 16   Ht 5\' 4"  (1.626 m)   Wt 62.6 kg   SpO2 100%   BMI 23.69 kg/m    Discharge Medications:   Allergies as of 04/30/2021       Reactions   Gabapentin Other (See Comments)    Delirium, stroke like symptoms    Polymyxin B Other (See Comments)   Eyes go blood red   Zolpidem Rash   "sleep walk"   Cymbalta [duloxetine Hcl] Other (See Comments)   Headaches, constipation   Fluoxetine Other (See Comments)   CONSTIPATION   Lamotrigine Rash   Other Other (See Comments)   OTOBIOTIC > RED EYES   Pregabalin Rash   Itchy red rash on chest        Medication List     STOP taking these medications    oxyCODONE-acetaminophen 10-325 MG tablet Commonly known as: PERCOCET       TAKE these medications    acetaminophen 325 MG tablet Commonly known as: TYLENOL Take 650 mg by mouth every 6 (six) hours as needed for moderate pain.   Aimovig 70 MG/ML Soaj Generic drug: Erenumab-aooe Inject 70 mg into the skin every 28 (twenty-eight) days.   alendronate 70 MG tablet Commonly known as: FOSAMAX Take 70 mg by mouth every Saturday.   b complex vitamins capsule Take 2 capsules by mouth daily.   baclofen 10 MG tablet Commonly known as: LIORESAL Take 10 mg by mouth every 8 (eight) hours as needed for muscle spasms.   cetirizine 10 MG tablet Commonly known as: ZYRTEC Take 10 mg by mouth daily as  needed for allergies.   cholecalciferol 25 MCG (1000 UNIT) tablet Commonly known as: VITAMIN D Take 2,000 Units by mouth in the morning.   Drysol 20 % external solution Generic drug: aluminum chloride Apply 1 application topically daily as needed (sweat).   fluticasone 50 MCG/ACT nasal spray Commonly known as: FLONASE Place 2 sprays into both nostrils 2 (two) times daily as needed for allergies or rhinitis.   levothyroxine 50 MCG tablet Commonly known as: Synthroid Take 1 tablet (50 mcg total) by mouth daily before breakfast.   methocarbamol 750 MG tablet Commonly known as: Robaxin-750 Take 1 tablet (750 mg total) by mouth every 6 (six) hours as needed for muscle spasms.   nitroGLYCERIN 0.2 mg/hr patch Commonly known as: Nitro-Dur Place 1 patch (0.2 mg  total) onto the skin daily. What changed:  when to take this reasons to take this   rizatriptan 10 MG tablet Commonly known as: MAXALT Take 10 mg by mouth daily as needed for migraine.   saccharomyces boulardii 250 MG capsule Commonly known as: FLORASTOR Take 500 mg by mouth daily.   THERATEARS OP Apply 1 drop to eye 4 (four) times daily as needed (dry eyes).       ASK your doctor about these medications    HYDROmorphone 2 MG tablet Commonly known as: Dilaudid Take 0.5 tablets (1 mg total) by mouth every 4 (four) hours as needed for up to 7 days for severe pain. Ask about: Should I take this medication?        Diagnostic Studies: DG Lumbar Spine 1 View  Result Date: 04/30/2021 CLINICAL DATA:  Removal of lumbar fusion hardware. EXAM: LUMBAR SPINE - 1 VIEW COMPARISON:  Lumbar spine x-ray 02/06/2021. FINDINGS: Intraoperative lumbar spine. One low resolution intraoperative spot views of the lumbar spine were obtained. Single view submitted demonstrating bone cage at L3-L4 level. Total fluoroscopy time: 3 seconds. Total radiation dose: 0.92 micro gray. IMPRESSION: Intraoperative lumbar spine. Electronically Signed   By: Ronney Asters M.D.   On: 04/30/2021 18:32   DG Knee Complete 4 Views Right  Result Date: 05/08/2021 CLINICAL DATA:  65 year old female with a history of pain for 3 months EXAM: RIGHT KNEE - COMPLETE 4+ VIEW COMPARISON:  None. FINDINGS: No acute displaced fracture. Minimal degenerative changes at the patellofemoral joint. Medial greater than lateral joint space narrowing without marginal osteophyte formation. No radiopaque foreign body.  No joint effusion. IMPRESSION: Negative for acute bony abnormality. Mild osteoarthritis Electronically Signed   By: Corrie Mckusick D.O.   On: 05/08/2021 14:25   DG C-Arm 1-60 Min-No Report  Result Date: 04/30/2021 Fluoroscopy was utilized by the requesting physician.  No radiographic interpretation.    Disposition:      -Scripts for pain sent to pharmacy electronically  -D/C instructions sheet printed and in chart -D/C today  -F/U in office 2 weeks   Signed: Lennie Muckle Darryon Bastin 05/15/2021, 1:01 PM

## 2021-05-17 DIAGNOSIS — M797 Fibromyalgia: Secondary | ICD-10-CM | POA: Diagnosis not present

## 2021-05-28 ENCOUNTER — Encounter: Payer: Self-pay | Admitting: Physical Therapy

## 2021-05-28 ENCOUNTER — Other Ambulatory Visit: Payer: Self-pay

## 2021-05-28 ENCOUNTER — Ambulatory Visit: Payer: PPO | Attending: Family Medicine | Admitting: Physical Therapy

## 2021-05-28 DIAGNOSIS — R293 Abnormal posture: Secondary | ICD-10-CM | POA: Diagnosis not present

## 2021-05-28 DIAGNOSIS — R2689 Other abnormalities of gait and mobility: Secondary | ICD-10-CM | POA: Diagnosis not present

## 2021-05-28 DIAGNOSIS — M6281 Muscle weakness (generalized): Secondary | ICD-10-CM | POA: Diagnosis not present

## 2021-05-28 NOTE — Therapy (Signed)
Sharon [place], [place], [address] Phone: [phone]   Fax:  [phone]  Physical Therapy Evaluation  Patient Details  Name: Sharon Logan MRN: [id number] Date of Birth: [birth year] Referring Provider (PT): Faustino Congress, FNP-C   Encounter Date: 05/28/2021   PT End of Session - 05/28/21 1617     Visit Number 1    Date for PT Re-Evaluation 08/20/21    Authorization - Visit Number 1    Authorization - Number of Visits 10    PT Start Time 3790    PT Stop Time 1225    PT Time Calculation (min) 40 min    Activity Tolerance Patient tolerated treatment well;No increased pain    Behavior During Therapy WFL for tasks assessed/performed             Past Medical History:  Diagnosis Date   Allergic rhinitis    Anxiety    Arthritis    Cancer (HCC)    skin   Chronic pain    Chronically dry eyes    Depression    Fibromyalgia    GERD (gastroesophageal reflux disease)    Headache    Osteoporosis    Vitamin D deficiency    Wears glasses     Past Surgical History:  Procedure Laterality Date   ABDOMINAL HYSTERECTOMY     BACK SURGERY     BUNIONECTOMY Bilateral    EYE SURGERY     laser surgery   NASAL SINUS SURGERY     x2   SACROILIAC JOINT FUSION Bilateral 09/12/2020   Procedure: REMOVAL OF BILATERAL PELVIC SCREWS;  Surgeon: Phylliss Bob, MD;  Location: [facility];  Service: Orthopedics;  Laterality: Bilateral;   SKIN CANCER EXCISION     face x3   TUBAL LIGATION      There were no vitals filed for this visit.    Subjective Assessment - 05/28/21 1144     Subjective Sharon Logan her back gave out on her. Patient has hardware in her back and it was removed 1 month ago. I have a back ache now. October started to have pain in the hip and pubic bone with walking. Patient walks 15 minutes per day. When patient urinates she has a burning feeling. Able to empty her bladder. Will leak urine when she stands  in line and wait.    Patient Stated Goals learn ways to ease pain in the groin and pubic bone    Currently in Pain? Yes    Pain Score 6    average 4/10   Pain Location Pelvis    Pain Orientation Anterior    Pain Descriptors / Indicators Burning;Aching;Tightness    Pain Type Acute pain    Pain Onset More than a month ago    Pain Frequency Intermittent    Aggravating Factors  begining of walking and the hip crease tenses up; cold    Pain Relieving Factors sit on heating pad    Multiple Pain Sites No                [facility] PT Assessment - 05/28/21 0001       Assessment   Medical Diagnosis R10.32 left groin pain    Referring Provider (PT) Faustino Congress, FNP-C    Onset Date/Surgical Date --   03/2021   Prior Therapy not for pelvic floor      Precautions   Precautions Other (comment)    Precaution Comments removed hardward in  back 03/2021      Restrictions   Weight Bearing Restrictions No      Balance Screen   Has the patient fallen in the past 6 months No    Has the patient had a decrease in activity level because of a fear of falling?  No    Is the patient reluctant to leave their home because of a fear of falling?  No      Prior Function   Level of Independence Independent    Vocation Retired    Leisure walks 15 min per day      Cognition   Overall Cognitive Status Within Functional Limits for tasks assessed      Observation/Other Assessments   Skin Integrity surgical sites on back are healed but restricted      Posture/Postural Control   Posture/Postural Control Postural limitations    Postural Limitations Flexed trunk      ROM / Strength   AROM / PROM / Strength AROM;PROM;Strength      PROM   Right Hip Extension 3    Right Hip External Rotation  40    Left Hip Extension 0    Left Hip External Rotation  60      Strength   Right Hip Extension 3-/5    Right Hip ABduction 3+/5    Left Hip Extension 3-/5    Left Hip ABduction 3+/5      Palpation    SI assessment  ASIS are equal      Special Tests    Special Tests Hip Special Tests    Hip Special Tests  [facility] Test    Findings Positive    Side Right;Left    Comments knees go to 40 degrees                        Objective measurements completed on examination: See above findings.     Pelvic Floor Special Questions - 05/28/21 0001     Prior Pregnancies Yes    Number of Pregnancies 2    Urinary Leakage Yes    Activities that cause leaking Other    Other activities that cause leaking when she has to wait on a line with a full bladder    Fecal incontinence No              [facility] Adult PT Treatment/Exercise - 05/28/21 0001       Lumbar Exercises: Stretches   Hip Flexor Stretch Right;Left;1 rep;30 seconds    Hip Flexor Stretch Limitations supine with strap      Lumbar Exercises: Supine   Clam 10 reps;1 second   each side   Bridge 10 reps;1 second    Other Supine Lumbar Exercises gluteal squeeze holding for 5 sec 10x                     PT Education - 05/28/21 1224     Education Details Access Code: [email or web address]    Person(s) Educated Patient    Methods Explanation;Demonstration;Verbal cues;Handout    Comprehension Returned demonstration;Verbalized understanding              PT Short Term Goals - 05/28/21 1548       PT SHORT TERM GOAL #1   Title independent with initial HEP for stretches    Time 4    Period Weeks    Status New    Target Date  06/25/21      PT SHORT TERM GOAL #2   Title able to walk with groin pain decreased >/= 25%    Time 4    Period Weeks    Status New    Target Date 06/25/21               PT Long Term Goals - 05/28/21 1549       PT LONG TERM GOAL #1   Title independent with advanced HEP for hip strength and back strength to improve upright posture    Time 12    Period Weeks    Status New    Target Date 08/20/21      PT LONG TERM GOAL #2   Title able to walk with groin pain  decreased >/= 75% due to improve hip extension and elongation of the hip flexors    Time 12    Period Weeks    Status New    Target Date 08/20/21      PT LONG TERM GOAL #3   Title able to passively extend her hips >/= 5 degrees so she has increased hip extension with walking    Time 12    Period Weeks    Status New    Target Date 08/20/21      PT LONG TERM GOAL #4   Title increased hip strength >/= 4/5 so she is able to walk with pain level </= 2/10 and improved trunk extension to elongate the muscles that cross the groin    Time 12    Period Weeks    Status New    Target Date 08/20/21                    Plan - 05/28/21 1224     Clinical Impression Statement Patient is a 65 year old female with groin pain since 03/2021. Patient reports in 03/2021 she had her hardware removed from her  lumbar spine due to it breaking. Patient walks with flexed hips, decreased extension and leans to the left. She reports her groin pain when she walks ranges from 4-6/10. Passive hip extension on the right is 0 degrees and left is 3 degrees. Strength of bilateral hip abduction and extension is 3-/5. Her pelvic is leveled. Her surgical scars are healed with decreased mobility. She has positive Thomas test bilaterally. Patient lumbar ROM was not assessed due to recent surgery and not cleared from her lumbar surgeon. Patient will be seeing her surgeon next week to see if she will be able to have physical therapy for her back. Patient will presently benefit from skilled therapy to work on groin pain, improve hip extension and improve hip strength.    Personal Factors and Comorbidities Comorbidity 2    Comorbidities Removal of hardware in lumbar spine 03/2021; Osteopenia; hysterectomy; Bilateral SI joint fusion 08/2020;    Examination-Activity Limitations Locomotion Level    Examination-Participation Restrictions Community Activity    Stability/Clinical Decision Making Stable/Uncomplicated    Clinical  Decision Making Low    Rehab Potential Excellent    PT Frequency 2x / week    PT Duration 8 weeks    PT Treatment/Interventions ADLs/Self Care Home Management;Biofeedback;Cryotherapy;Electrical Stimulation;Moist Heat;Therapeutic activities;Therapeutic exercise;Neuromuscular re-education;Patient/family education;Manual techniques;Scar mobilization;Dry needling;Taping;Joint Manipulations    PT Next Visit Plan See if her spinal surgeon released her for therapy on her back then need to do a re-eval and make sure the referral goes under the present referring doctor; manual work to the  quads and hip flexors to elongate due to positive Thomas test, strengthen gluteals and back muscles; manual work on scar from back surgery; work on gluteus medius strength    PT Home Exercise Plan Access Code: [email or web address]    [email or web address] and Agree with Plan of Care Patient             Patient will benefit from skilled therapeutic intervention in order to improve the following deficits and impairments:  Decreased range of motion, Increased fascial restricitons, Decreased endurance, Decreased activity tolerance, Decreased strength, Decreased mobility, Decreased scar mobility, Pain  Visit Diagnosis: Muscle weakness (generalized) - Plan: PT plan of care cert/re-cert  Abnormal posture - Plan: PT plan of care cert/re-cert  Other abnormalities of gait and mobility - Plan: PT plan of care cert/re-cert     Problem List Patient Active Problem List   Diagnosis Date Noted   S/P lumbar microdiscectomy 04/30/2021   S/P hardware removal 09/12/2020   Radiculopathy 06/29/2019   Cervical radiculopathy 08/15/2018   Chronic sinusitis 07/27/2018   Constipation 07/27/2018   Chronic bilateral low back pain with bilateral sciatica 07/01/2018   Disorder of bone and cartilage 04/04/2018   Esophageal reflux 04/04/2018   Other and unspecified hyperlipidemia 04/04/2018   Other malaise and fatigue 04/04/2018   Pain in joint involving  ankle and foot 04/04/2018   Pain in soft tissues of limb 04/04/2018   Posttraumatic stress disorder 04/04/2018   Raynaud's phenomenon 04/04/2018   Skin sensation disturbance 04/04/2018   Sleep disturbance 04/04/2018   Vitamin D deficiency 04/04/2018   Myoclonus, segmental 10/18/2017   Adjacent segment disease with spinal stenosis 10/04/2017   DDD (degenerative disc disease), lumbar 10/30/2016   Spondylolisthesis of lumbar region 08/21/2016   Overweight with body mass index (BMI) 25.0-29.9 07/22/2016   Capsulitis of metatarsophalangeal (MTP) joint of right foot 03/23/2016   Lumbar spinal stenosis 12/05/2015   Migraine 09/13/2012   Allergic rhinitis 09/13/2012   Neuropathy 09/13/2012    Earlie Counts, PT 05/28/21 4:18 PM  [facility] [place], [place], [address] Phone: [phone]   Fax:  [phone]  Name: Sharon Logan MRN: [id number] Date of Birth: 24-Jul-1955

## 2021-05-28 NOTE — Patient Instructions (Signed)
Access Code: [email or web address] URL: [email or web address] Date: 05/28/2021 Prepared by: Earlie Counts  Exercises Hooklying Gluteal Sets - 1 x daily - 7 x weekly - 1 sets - 10 reps - 5 sec hold Bent Knee Fallouts - 1 x daily - 7 x weekly - 1 sets - 10 reps Supine Quadriceps Stretch with Strap on Table - 1 x daily - 7 x weekly - 1 sets - 2 reps - 30 sec hold [facility] [address] [place], [place] [address] Phone # [phone] Fax [phone]

## 2021-05-29 ENCOUNTER — Encounter: Payer: Self-pay | Admitting: Psychiatry

## 2021-05-29 ENCOUNTER — Ambulatory Visit (INDEPENDENT_AMBULATORY_CARE_PROVIDER_SITE_OTHER): Payer: PPO | Admitting: Psychiatry

## 2021-05-29 DIAGNOSIS — F4310 Post-traumatic stress disorder, unspecified: Secondary | ICD-10-CM | POA: Diagnosis not present

## 2021-05-29 DIAGNOSIS — F331 Major depressive disorder, recurrent, moderate: Secondary | ICD-10-CM

## 2021-05-29 MED ORDER — NORTRIPTYLINE HCL 10 MG PO CAPS: ORAL_CAPSULE | ORAL | 1 refills | Status: DC

## 2021-05-29 MED ORDER — ARIPIPRAZOLE 2 MG PO TABS: ORAL_TABLET | ORAL | 1 refills | Status: DC

## 2021-05-29 NOTE — Progress Notes (Signed)
Sharon Logan [id number] 02-14-1956 65 y.o.  Subjective:   Patient ID:  Sharon Logan is a 65 y.o. (DOB 11-01-1955) female.  Chief Complaint:  Chief Complaint  Patient presents with   Depression    HPI [name] presents to the office today for follow-up of depression and anxiety. She reports, "we have to drop the Amitriptyline." She reports that her mood has been depressed and has very low energy and motivation. She reports that she has difficulty getting out of bed. Has not been keeping house as clean. She reports that she has not been enjoying things. Not looking forward to things. Denies feeling nervous or having anxious thoughts. Denies any recent intrusive memories or nightmares. She reports that she has been more irritable on Amitriptyline. She reports that her sleep has improved. Appetite "comes and goes." Noticed her appetite was lower when pain was high. She reports that her concentration is poor at times. Reports frequently misplacing objects, such as her cell phone. Denies SI.   Has not taken Amitriptyline for the last 2 nights.   Has been seeing [name], [facility] for therapy.   Denies any acute psychosocial stressors. Expecting family next week for Christmas.  Past Medication Trials: Lexapro- "My anti-depressant of choice" and gives her energy. Was taking 2-3 years prior to surgery. Re-started one week ago. Has taken 10 mg and 20 mg doses in the past. Thinks it may have helped with pain in the past. Cymbalta- Started after back surgery. Has made her tired, even when reduced to 30 mg. Was having to take 2 hour naps. May have helped slightly with pain. Had HA, Constipation, Nausea Savella- muscle aches, rhabdomyolysis Effexor-HA, helped with excessive sweating.  Pristiq- May have increased depression. Prozac- Constipation. Causes her to feel cold.  Celexa Lexapro Zoloft- worsening mood Paxil Wellbutrin- Did not cause HA's. Tolerated well and was effective for  depression. Amitriptyline- Did not cause HA's. Weight gain. Effective.  Nortriptyline- Did not cause HA's. Effective. Lamictal- Rash Gabapentin -Rhabomyolysis Lyrica- Adverse reaction Buspar Trazodone- helped with sleep initiation but sleep was not restful. Ambien- Parasomnia Lunesta- Tolerated, effective at 2 mg dose Remeron- Dry eyes, internal restlessness. Stomach discomfort.  Clonidine Topamax- ineffective for headaches. Caused cognitive side effects.     [facility] Office Visit from 03/25/2021 in [facility] Visit from 12/24/2020 in [facility] Visit from 10/08/2020 in [facility]  PHQ-2 Total Score 0 2 2  PHQ-9 Total Score -- -- 8      Flowsheet Row Admission (Discharged) from 04/30/2021 in [facility] from 04/21/2021 in [facility] PREADMISSION TESTING Admission (Discharged) from 09/12/2020 in [facility] No Risk No Risk No Risk        Review of Systems:  Review of Systems  Gastrointestinal:  Negative for constipation.  Musculoskeletal:  Positive for arthralgias. Negative for gait problem.       Improved back pain since surgery  Psychiatric/Behavioral:         Please refer to HPI   Medications: I have reviewed the patient's current medications.  Current Outpatient Medications  Medication Sig Dispense Refill   acetaminophen (TYLENOL) 325 MG tablet Take 650 mg by mouth every 6 (six) hours as needed for moderate pain.     ARIPiprazole (ABILIFY) 2 MG tablet Take 1/2-1 tab po qd 30 tablet  1   b complex vitamins capsule Take 2 capsules by mouth daily.     baclofen (LIORESAL) 10 MG tablet Take 10 mg by mouth every 8 (eight) hours as needed for muscle spasms.     Carboxymethylcellulose Sodium (THERATEARS OP) Apply 1 drop to eye 4 (four) times  daily as needed (dry eyes).     cetirizine (ZYRTEC) 10 MG tablet Take 10 mg by mouth daily as needed for allergies.     cholecalciferol (VITAMIN D) 25 MCG (1000 UNIT) tablet Take 2,000 Units by mouth in the morning.     fluticasone (FLONASE) 50 MCG/ACT nasal spray Place 2 sprays into both nostrils 2 (two) times daily as needed for allergies or rhinitis.     magnesium 30 MG tablet Take 30 mg by mouth 2 (two) times daily.     rizatriptan (MAXALT) 10 MG tablet Take 10 mg by mouth daily as needed for migraine.      saccharomyces boulardii (FLORASTOR) 250 MG capsule Take 500 mg by mouth daily.     alendronate (FOSAMAX) 70 MG tablet Take 70 mg by mouth every Saturday. (Patient not taking: Reported on 05/29/2021)     amitriptyline (ELAVIL) 50 MG tablet Take 1 tablet (50 mg total) by mouth at bedtime. (Patient not taking: Reported on 05/29/2021) 30 tablet 0   DRYSOL 20 % external solution Apply 1 application topically daily as needed (sweat).     Erenumab-aooe (AIMOVIG) 70 MG/ML SOAJ Inject 70 mg into the skin every 28 (twenty-eight) days. (Patient not taking: No sig reported) 1 mL 0   levothyroxine (SYNTHROID) 50 MCG tablet Take 1 tablet (50 mcg total) by mouth daily before breakfast. (Patient not taking: No sig reported) 30 tablet 3   methocarbamol (ROBAXIN-750) 750 MG tablet Take 1 tablet (750 mg total) by mouth every 6 (six) hours as needed for muscle spasms. 30 tablet 2   nitroGLYCERIN (NITRO-DUR) 0.2 mg/hr patch Place 1 patch (0.2 mg total) onto the skin daily. (Patient taking differently: Place 0.2 mg onto the skin daily as needed (bunion flare).) 30 patch 2   nortriptyline (PAMELOR) 10 MG capsule Take 1 capsule at bedtime for one week, then may increase to 2 capsules at bedtime 60 capsule 1   No current facility-administered medications for this visit.    Medication Side Effects: Other: Irritability  Allergies:  Allergies  Allergen Reactions   Gabapentin Other (See Comments)    Delirium,  stroke like symptoms    Polymyxin B Other (See Comments)    Eyes go blood red    Zolpidem Rash    "sleep walk"    Cymbalta [Duloxetine Hcl] Other (See Comments)    Headaches, constipation   Fluoxetine Other (See Comments)    CONSTIPATION    Lamotrigine Rash   Other Other (See Comments)    OTOBIOTIC > RED EYES   Pregabalin Rash    Itchy red rash on chest     Past Medical History:  Diagnosis Date   Allergic rhinitis    Anxiety    Arthritis    Cancer (HCC)    skin   Chronic pain    Chronically dry eyes    Depression    Fibromyalgia    GERD (gastroesophageal reflux disease)    Headache    Osteoporosis    Vitamin D deficiency    Wears glasses     Past Medical History, Surgical history, Social history, and Family history were reviewed and updated as appropriate.   Please see review  of systems for further details on the patient's review from today.   Objective:   Physical Exam:  There were no vitals taken for this visit.  Physical Exam Constitutional:      General: She is not in acute distress. Musculoskeletal:        General: No deformity.  Neurological:     Mental Status: She is alert and oriented to person, place, and time.     Coordination: Coordination normal.  Psychiatric:        Attention and Perception: Attention and perception normal. She does not perceive auditory or visual hallucinations.        Mood and Affect: Mood is anxious and depressed. Affect is not labile, blunt, angry or inappropriate.        Speech: Speech normal.        Behavior: Behavior normal.        Thought Content: Thought content normal. Thought content is not paranoid or delusional. Thought content does not include homicidal or suicidal ideation. Thought content does not include homicidal or suicidal plan.        Cognition and Memory: Cognition and memory normal.        Judgment: Judgment normal.     Comments: Insight intact    Lab Review:     Component Value Date/Time   NA  138 04/21/2021 0910   K 4.0 04/21/2021 0910   CL 104 04/21/2021 0910   CO2 26 04/21/2021 0910   GLUCOSE 93 04/21/2021 0910   BUN 17 04/21/2021 0910   CREATININE 0.77 04/21/2021 0910   CALCIUM 9.5 04/21/2021 0910   PROT 7.4 04/21/2021 0910   ALBUMIN 3.8 04/21/2021 0910   AST 26 04/21/2021 0910   ALT 21 04/21/2021 0910   ALKPHOS 120 04/21/2021 0910   BILITOT 0.7 04/21/2021 0910   GFRNONAA >60 04/21/2021 0910   GFRAA >60 06/26/2019 1008       Component Value Date/Time   WBC 8.9 04/21/2021 0910   RBC 4.14 04/21/2021 0910   HGB 12.4 04/21/2021 0910   HCT 40.3 04/21/2021 0910   PLT 366 04/21/2021 0910   MCV 97.3 04/21/2021 0910   MCH 30.0 04/21/2021 0910   MCHC 30.8 04/21/2021 0910   RDW 13.8 04/21/2021 0910   LYMPHSABS 1.5 04/21/2021 0910   MONOABS 0.5 04/21/2021 0910   EOSABS 0.3 04/21/2021 0910   BASOSABS 0.1 04/21/2021 0910    No results found for: POCLITH, LITHIUM   No results found for: PHENYTOIN, PHENOBARB, VALPROATE, CBMZ   .res Assessment: Plan:   Pt seen for 25 minutes and time spent discussing possible treatment options for depression and answering her questions/request regarding a medication option that targets dopamine. Discussed that Wellbutrin, which she has tried and failed, and the atypical antipsychotics target dopamine. Discussed potential metabolic side effects associated with atypical antipsychotics, as well as potential risk for movement side effects. Advised pt to contact office if movement side effects occur. Discussed potential benefits, risks, and side effects of Abilify and pt agrees to trial of Abilify. Will start Abilify 2 mg 1/2-1 tab po qd for augmentation of depression.  Will discontinue Amitriptyline and remove from medication list since pt reports that she stopped taking Amitriptyline several days ago.  She requests to re-starting Nortriptyline since she reports that this was partially helpful for pain and depression in the past. Will re-start  Nortriptyline 10 mg po QHS for one week, then increase to 20 mg po QHS for depression.  Recommend continuing therapy with Marjory Lies  Nicole Kindred, [facility].  Pt to follow-up with this provider in 6 weeks or sooner if clinically indicated.  Patient advised to contact office with any questions, adverse effects, or acute worsening in signs and symptoms.     Emara was seen today for depression.  Diagnoses and all orders for this visit:  PTSD (post-traumatic stress disorder) -     nortriptyline (PAMELOR) 10 MG capsule; Take 1 capsule at bedtime for one week, then may increase to 2 capsules at bedtime  Moderate recurrent major depression (HCC) -     nortriptyline (PAMELOR) 10 MG capsule; Take 1 capsule at bedtime for one week, then may increase to 2 capsules at bedtime -     ARIPiprazole (ABILIFY) 2 MG tablet; Take 1/2-1 tab po qd     Please see After Visit Summary for patient specific instructions.  Future Appointments  Date Time Provider [place]  06/03/2021  1:00 PM [facility] 1 [facility] [address] [facility]  06/03/2021  3:15 PM Trula Slade, DPM [facility]  06/04/2021 11:00 AM Monico Hoar, PT [facility] None  06/11/2021 11:00 AM Monico Hoar, PT [facility] None  06/17/2021  2:45 PM Candyce Churn B, PT [facility] None  06/25/2021 11:00 AM Monico Hoar, PT [facility] None  07/01/2021 11:45 AM Candyce Churn B, PT [facility] None  07/03/2021 11:45 AM Candyce Churn B, PT [facility] None  07/09/2021 11:00 AM Earlie Counts F, PT [facility] None  07/11/2021  1:15 PM Thayer Headings, PMHNP [facility] None  07/15/2021 11:20 AM Raulkar, Clide Deutscher, MD [facility] CPR  07/15/2021  2:00 PM Candyce Churn B, PT [facility] None  07/23/2021 11:00 AM Monico Hoar, PT [facility] None  07/24/2021 11:00 AM Edgardo Roys, PsyD [facility]  09/01/2021  2:00 PM Edgardo Roys, PsyD [facility]  09/15/2021  1:00 PM Edgardo Roys, PsyD [facility]  10/06/2021  9:50 AM Pieter Partridge, DO [facility]  None    No orders of the defined types were placed in this encounter.   -------------------------------

## 2021-06-02 DIAGNOSIS — Z9889 Other specified postprocedural states: Secondary | ICD-10-CM | POA: Diagnosis not present

## 2021-06-02 DIAGNOSIS — M545 Low back pain, unspecified: Secondary | ICD-10-CM | POA: Diagnosis not present

## 2021-06-03 ENCOUNTER — Ambulatory Visit (INDEPENDENT_AMBULATORY_CARE_PROVIDER_SITE_OTHER): Payer: PPO | Admitting: Podiatry

## 2021-06-03 ENCOUNTER — Ambulatory Visit
Admission: RE | Admit: 2021-06-03 | Discharge: 2021-06-03 | Disposition: A | Discharge status: Home / Self Care | Payer: PPO | Source: Ambulatory Visit | Attending: Family Medicine | Admitting: Family Medicine

## 2021-06-03 ENCOUNTER — Other Ambulatory Visit: Payer: Self-pay

## 2021-06-03 DIAGNOSIS — K5730 Diverticulosis of large intestine without perforation or abscess without bleeding: Secondary | ICD-10-CM | POA: Diagnosis not present

## 2021-06-03 DIAGNOSIS — M21961 Unspecified acquired deformity of right lower leg: Secondary | ICD-10-CM

## 2021-06-03 DIAGNOSIS — S3210XA Unspecified fracture of sacrum, initial encounter for closed fracture: Secondary | ICD-10-CM | POA: Diagnosis not present

## 2021-06-03 DIAGNOSIS — L089 Local infection of the skin and subcutaneous tissue, unspecified: Secondary | ICD-10-CM | POA: Diagnosis not present

## 2021-06-03 DIAGNOSIS — K429 Umbilical hernia without obstruction or gangrene: Secondary | ICD-10-CM | POA: Diagnosis not present

## 2021-06-03 DIAGNOSIS — R1032 Left lower quadrant pain: Secondary | ICD-10-CM

## 2021-06-03 DIAGNOSIS — M779 Enthesopathy, unspecified: Secondary | ICD-10-CM

## 2021-06-03 DIAGNOSIS — S32511A Fracture of superior rim of right pubis, initial encounter for closed fracture: Secondary | ICD-10-CM | POA: Diagnosis not present

## 2021-06-03 IMAGING — CT CT ABD-PELV W/ CM
1 of 3 series · 13 of 32 positions shown, 19 images · IV contrast (iopamidol)
Comparison: [DATE]

CLINICAL DATA: BILATERAL groin pain for 3 months worse on LEFT

EXAM:
CT ABDOMEN AND PELVIS WITH CONTRAST
TECHNIQUE: Multidetector CT imaging of the abdomen and pelvis was performed
using the standard protocol following bolus administration of
intravenous contrast.
CONTRAST:  100mL [SV] IOPAMIDOL ([SV]) INJECTION 61% IV.
No oral contrast.

[Series 2: abd/pelvis w/cm · axial · 0.84mm/px · z∈[+504,+869]mm · 13 of 87 slices shown, 19 images]
[im 7/87  soft-tissue]
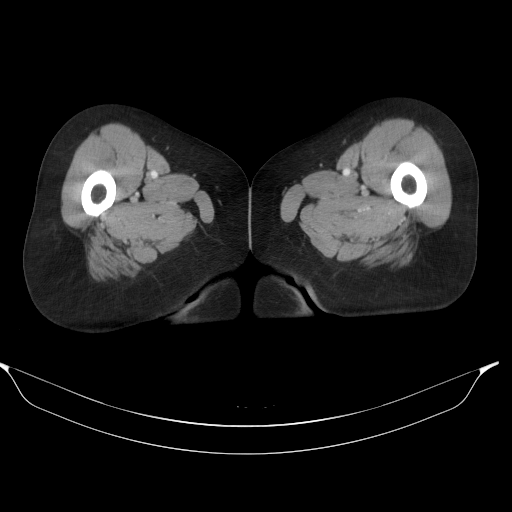
[im 7/87  bone]
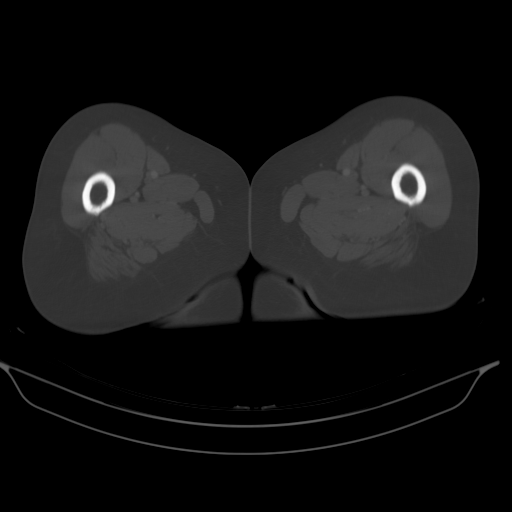
[im 13/87  soft-tissue]
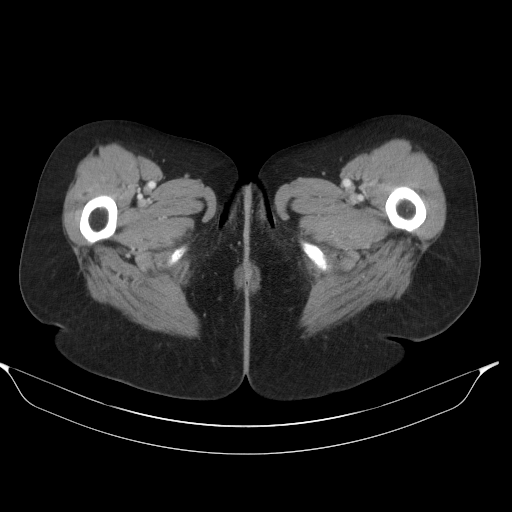
[im 19/87  soft-tissue]
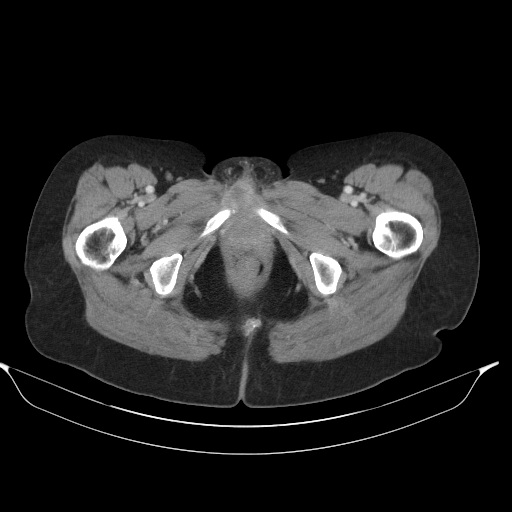
[im 25/87  soft-tissue]
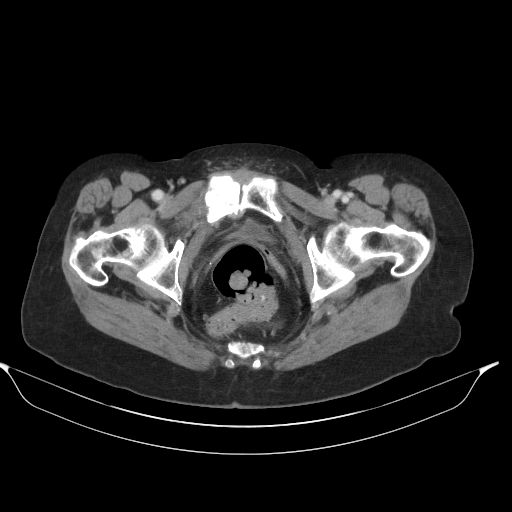
[im 31/87  soft-tissue]
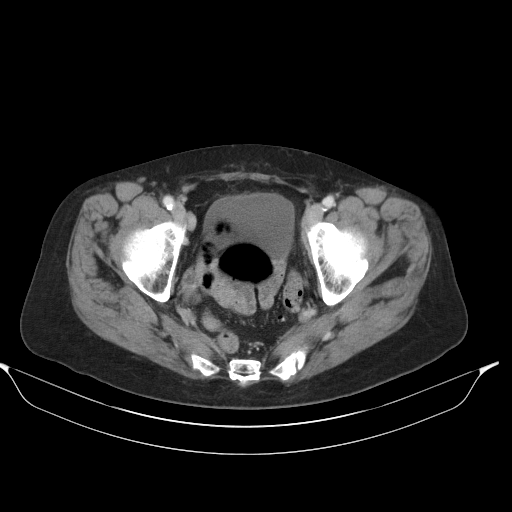
[im 37/87  soft-tissue]
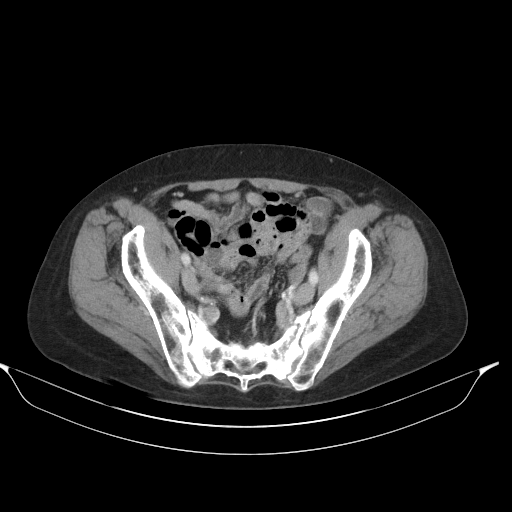
[im 44/87  soft-tissue]
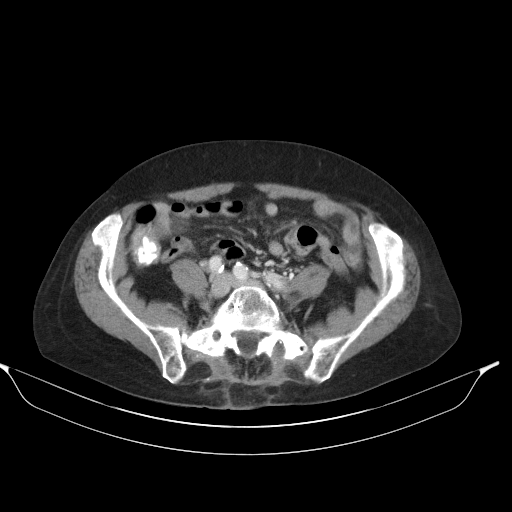
[im 50/87  soft-tissue]
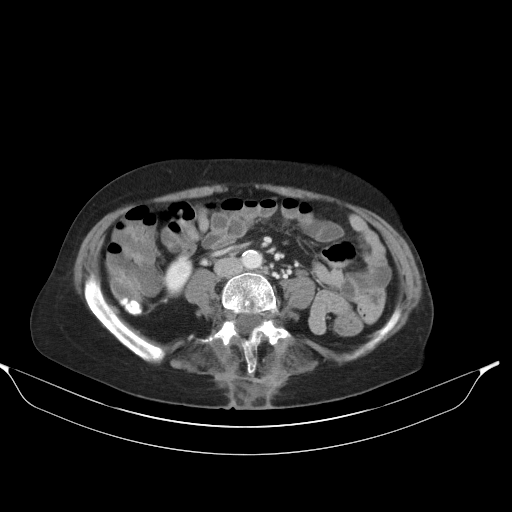
[im 56/87  soft-tissue]
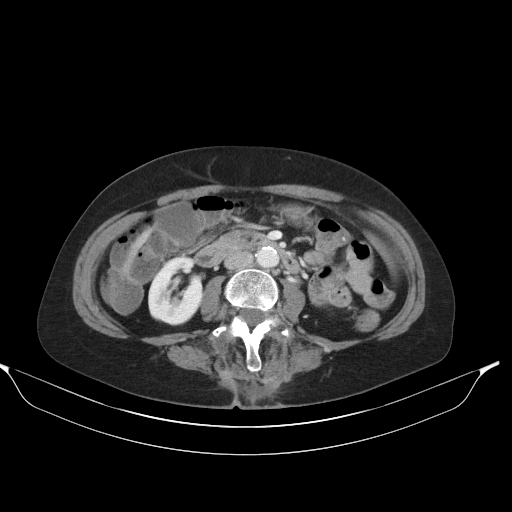
[im 56/87  bone]
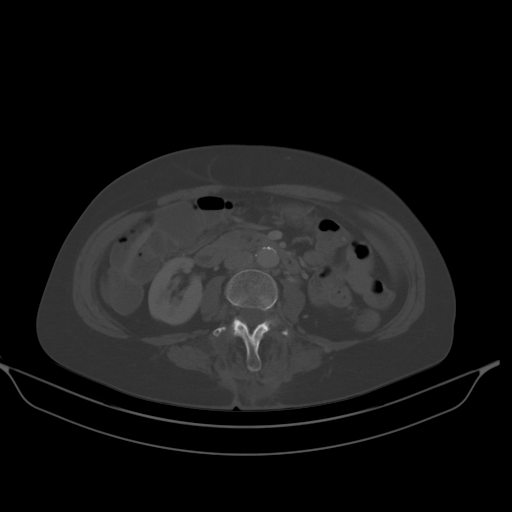
[im 62/87  soft-tissue]
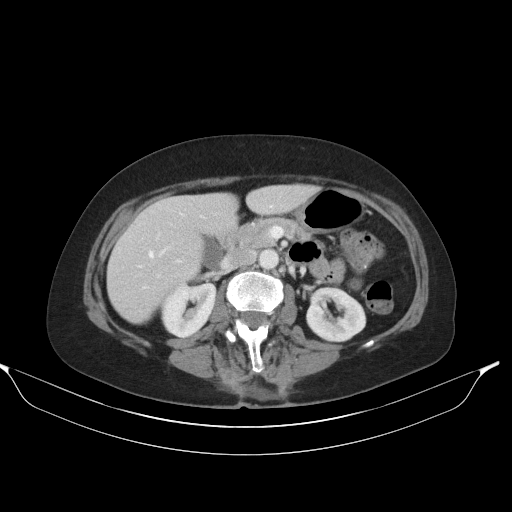
[im 62/87  lung]
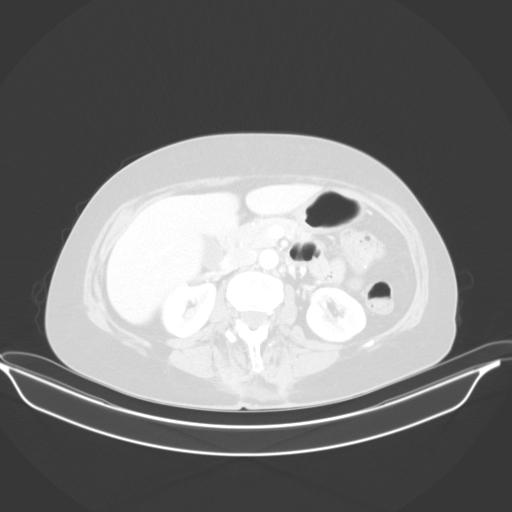
[im 68/87  soft-tissue]
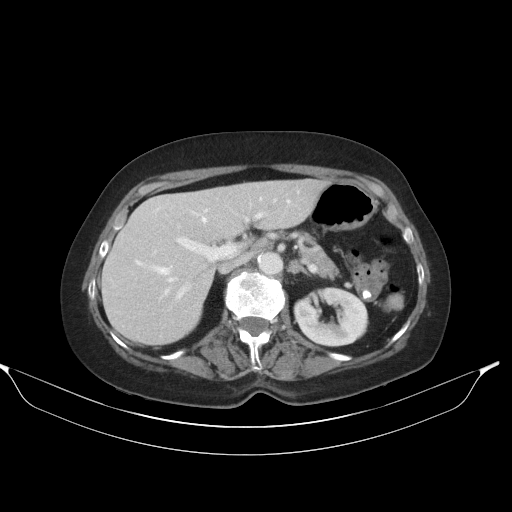
[im 68/87  lung]
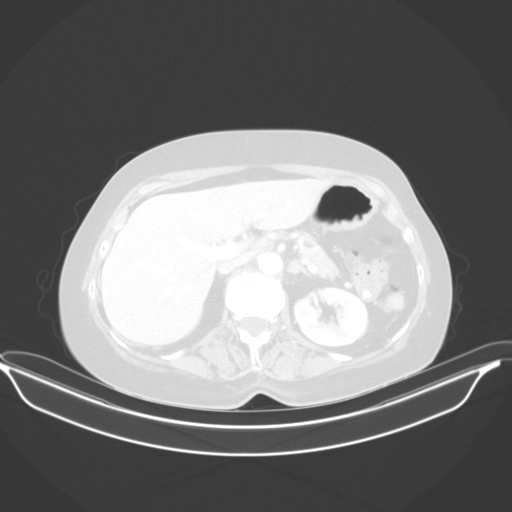
[im 74/87  soft-tissue]
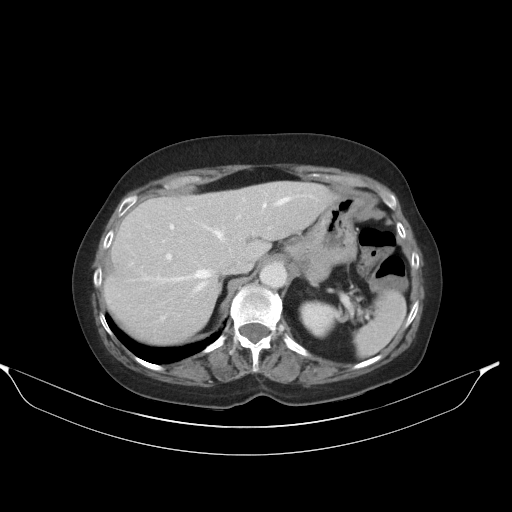
[im 74/87  lung]
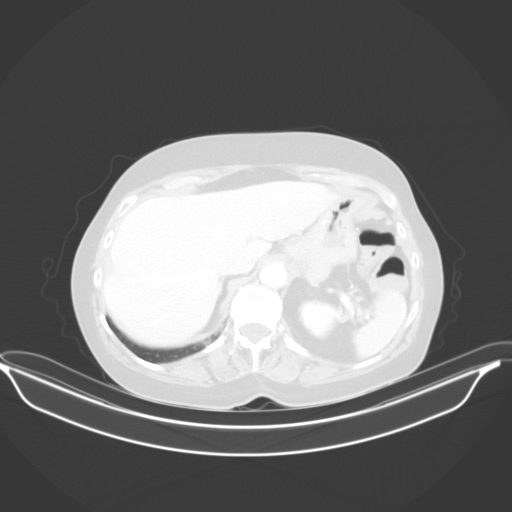
[im 80/87  soft-tissue]
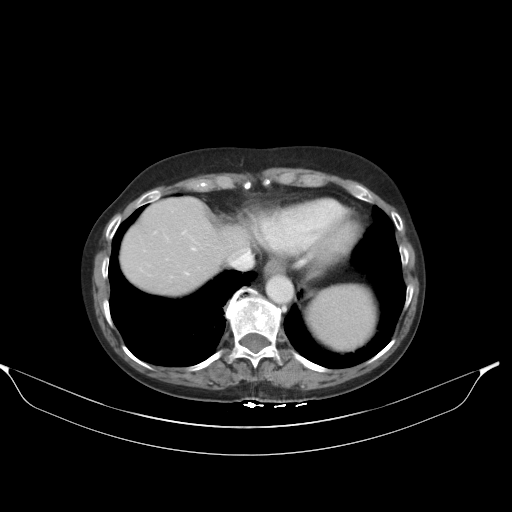
[im 80/87  lung]
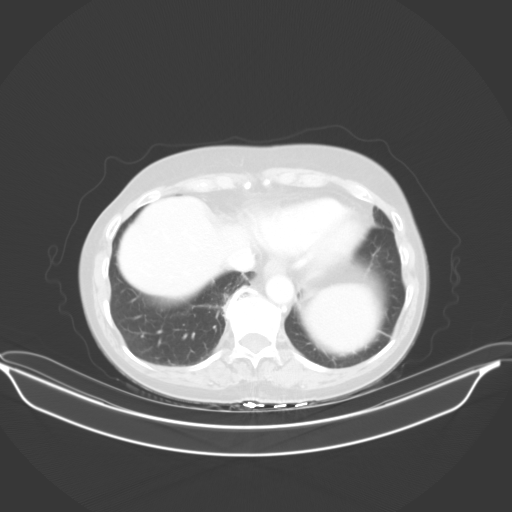

[13 of 32 positions shown; findings below may reference images not displayed]

FINDINGS: Lower chest: Minimal bibasilar atelectasis versus scarring.

Hepatobiliary: Gallbladder and liver normal appearance

Pancreas: Normal appearance

Spleen: Normal appearance

Adrenals/Urinary Tract: Adrenal glands, kidneys, ureters, and
bladder normal appearance

Stomach/Bowel: Minimal distal colonic diverticulosis without
evidence of diverticulitis. Appendix not visualized. Radiopacities
within RIGHT colon question medication tablets. Bowel loops
unremarkable.

Vascular/Lymphatic: Atherosclerotic calcifications aorta and iliac
arteries without aneurysm. Vascular structures patent. No
adenopathy.

Reproductive: Uterus surgically absent.  Unremarkable ovaries.

Other: No free air or free fluid. Tiny umbilical hernia containing
fat.

Musculoskeletal: Fracture of RIGHT pubic body at junction of
superior and inferior pubic rami. BILATERAL sacral fractures.
Postsurgical changes from lumbar surgery and hardware removal.
IMPRESSION: Subacute BILATERAL sacral fractures.

Subacute fracture of RIGHT pubic body at junction of RIGHT superior
and inferior pubic rami.

Minimal distal colonic diverticulosis without evidence of
diverticulitis.

Tiny umbilical hernia containing fat.

Aortic Atherosclerosis ([SV]-[SV]).

These results will be called to the ordering clinician or
representative by the Radiologist Assistant, and communication
documented in the PACS or [REDACTED].

## 2021-06-03 MED ORDER — IOPAMIDOL (ISOVUE-300) INJECTION 61%
100.0000 mL | Freq: Once | INTRAVENOUS | Status: AC | PRN
  Administered 2021-06-03: 13:00:00 100 mL via INTRAVENOUS

## 2021-06-04 ENCOUNTER — Encounter: Payer: Self-pay | Admitting: Physical Therapy

## 2021-06-04 ENCOUNTER — Ambulatory Visit: Payer: PPO | Admitting: Physical Therapy

## 2021-06-04 ENCOUNTER — Other Ambulatory Visit: Payer: Self-pay | Admitting: Podiatry

## 2021-06-04 DIAGNOSIS — M779 Enthesopathy, unspecified: Secondary | ICD-10-CM

## 2021-06-04 DIAGNOSIS — M6281 Muscle weakness (generalized): Secondary | ICD-10-CM | POA: Diagnosis not present

## 2021-06-04 DIAGNOSIS — R293 Abnormal posture: Secondary | ICD-10-CM

## 2021-06-04 DIAGNOSIS — L089 Local infection of the skin and subcutaneous tissue, unspecified: Secondary | ICD-10-CM

## 2021-06-04 DIAGNOSIS — R2689 Other abnormalities of gait and mobility: Secondary | ICD-10-CM

## 2021-06-04 LAB — CBC WITH DIFFERENTIAL/PLATELET
Basophils Absolute: 0 10*3/uL (ref 0.0–0.2)
Basos: 0 %
EOS (ABSOLUTE): 0.1 10*3/uL (ref 0.0–0.4)
Eos: 1 %
Hematocrit: 37.6 % (ref 34.0–46.6)
Hemoglobin: 12.1 g/dL (ref 11.1–15.9)
Immature Grans (Abs): 0 10*3/uL (ref 0.0–0.1)
Immature Granulocytes: 0 %
Lymphocytes Absolute: 2 10*3/uL (ref 0.7–3.1)
Lymphs: 27 %
MCH: 29.5 pg (ref 26.6–33.0)
MCHC: 32.2 g/dL (ref 31.5–35.7)
MCV: 92 fL (ref 79–97)
Monocytes Absolute: 0.5 10*3/uL (ref 0.1–0.9)
Monocytes: 7 %
Neutrophils Absolute: 4.7 10*3/uL (ref 1.4–7.0)
Neutrophils: 65 %
Platelets: 336 10*3/uL (ref 150–450)
RBC: 4.1 x10E6/uL (ref 3.77–5.28)
RDW: 12.8 % (ref 11.7–15.4)
WBC: 7.3 10*3/uL (ref 3.4–10.8)

## 2021-06-04 LAB — BASIC METABOLIC PANEL
BUN/Creatinine Ratio: 30 — ABNORMAL HIGH (ref 12–28)
BUN: 21 mg/dL (ref 8–27)
CO2: 24 mmol/L (ref 20–29)
Calcium: 9.5 mg/dL (ref 8.7–10.3)
Chloride: 104 mmol/L (ref 96–106)
Creatinine, Ser: 0.71 mg/dL (ref 0.57–1.00)
Glucose: 103 mg/dL — ABNORMAL HIGH (ref 70–99)
Potassium: 4.6 mmol/L (ref 3.5–5.2)
Sodium: 142 mmol/L (ref 134–144)
eGFR: 93 mL/min/{1.73_m2} (ref >59)

## 2021-06-04 LAB — C-REACTIVE PROTEIN: CRP: 15 mg/L — ABNORMAL HIGH (ref 0–10)

## 2021-06-04 LAB — SEDIMENTATION RATE: Sed Rate: 34 mm/hr (ref 0–40)

## 2021-06-04 NOTE — Therapy (Signed)
[facility] @ [address] [place], [place], [address] Phone: [phone]   Fax:  [phone]  Physical Therapy Treatment  Patient Details  Name: Sharon Logan MRN: [id number] Date of Birth: [birth year] Referring Provider (PT): Faustino Congress, FNP-C   Encounter Date: 06/04/2021   PT End of Session - 06/04/21 1143     Visit Number 2    Date for PT Re-Evaluation 08/20/21    Authorization Type [employer]    Authorization - Visit Number 2    Authorization - Number of Visits 10    PT Start Time 1100    PT Stop Time 1140    PT Time Calculation (min) 40 min    Activity Tolerance Patient tolerated treatment well;No increased pain    Behavior During Therapy WFL for tasks assessed/performed             Past Medical History:  Diagnosis Date   Allergic rhinitis    Anxiety    Arthritis    Cancer (HCC)    skin   Chronic pain    Chronically dry eyes    Depression    Fibromyalgia    GERD (gastroesophageal reflux disease)    Headache    Osteoporosis    Vitamin D deficiency    Wears glasses     Past Surgical History:  Procedure Laterality Date   ABDOMINAL HYSTERECTOMY     BACK SURGERY     BUNIONECTOMY Bilateral    EYE SURGERY     laser surgery   NASAL SINUS SURGERY     x2   SACROILIAC JOINT FUSION Bilateral 09/12/2020   Procedure: REMOVAL OF BILATERAL PELVIC SCREWS;  Surgeon: Phylliss Bob, MD;  Location: [facility];  Service: Orthopedics;  Laterality: Bilateral;   SKIN CANCER EXCISION     face x3   TUBAL LIGATION      There were no vitals filed for this visit.   Subjective Assessment - 06/04/21 1107     Subjective I felt fine after the initial eval. The exercise to bring the hip into internal rotation felt cramps and some pain. Doing the stretches in the morning have helped.    Patient Stated Goals learn ways to ease pain in the groin and pubic bone    Currently in Pain? Yes    Pain Score 5     Pain  Location Pelvis    Pain Orientation Right;Left;Anterior    Pain Descriptors / Indicators Burning;Aching;Tightness    Pain Type Acute pain    Pain Onset More than a month ago    Pain Frequency Intermittent    Aggravating Factors  toward the evening when the body is tired.    Pain Relieving Factors sit on heating pad    Multiple Pain Sites No                               [facility] Adult PT Treatment/Exercise - 06/04/21 0001       Lumbar Exercises: Stretches   Piriformis Stretch Right;Left;2 reps;10 seconds    Other Lumbar Stretch Exercise hip adductor stretch with therapist assisting right, left 2x for 10 sec bil. then the same to hip internal rotation      Lumbar Exercises: Aerobic   Nustep level 3 for 5 min. while assessing patient      Lumbar Exercises: Supine   Bridge Limitations tried 3 bridges but cramp in the hamstring  Knee/Hip Exercises: Prone   Other Prone Exercises prone TKE with tactile cues to contract the gluteals 10x each side      Manual Therapy   Manual Therapy Joint mobilization;Soft tissue mobilization    Joint Mobilization distraction of bil. hips, posterior glide, anterior glide and medial glide grade 3    Soft tissue mobilization to bilateral quads in stretch position using the addaday                     PT Education - 06/04/21 1140     Education Details Access Code: [email or web address]    Person(s) Educated Patient    Methods Explanation;Demonstration;Verbal cues;Handout    Comprehension Returned demonstration;Verbalized understanding              PT Short Term Goals - 05/28/21 1548       PT SHORT TERM GOAL #1   Title independent with initial HEP for stretches    Time 4    Period Weeks    Status New    Target Date 06/25/21      PT SHORT TERM GOAL #2   Title able to walk with groin pain decreased >/= 25%    Time 4    Period Weeks    Status New    Target Date 06/25/21               PT Long Term Goals -  05/28/21 1549       PT LONG TERM GOAL #1   Title independent with advanced HEP for hip strength and back strength to improve upright posture    Time 12    Period Weeks    Status New    Target Date 08/20/21      PT LONG TERM GOAL #2   Title able to walk with groin pain decreased >/= 75% due to improve hip extension and elongation of the hip flexors    Time 12    Period Weeks    Status New    Target Date 08/20/21      PT LONG TERM GOAL #3   Title able to passively extend her hips >/= 5 degrees so she has increased hip extension with walking    Time 12    Period Weeks    Status New    Target Date 08/20/21      PT LONG TERM GOAL #4   Title increased hip strength >/= 4/5 so she is able to walk with pain level </= 2/10 and improved trunk extension to elongate the muscles that cross the groin    Time 12    Period Weeks    Status New    Target Date 08/20/21                   Plan - 06/04/21 1132     Clinical Impression Statement Patient is able to stand more erect due to increased length of quads. Patient has trouble doing bridges due to cramps in hamstring and difficutly with contracting the gluteal. Patient had increased hip extension after manual work. She has received clearance from her spinal doctor to do therapy and incorporate back exercises that also strength hip extension. Patient will benefit from skilled therapy to work on groin pain, improve hip extension and improve hip strength.    Personal Factors and Comorbidities Comorbidity 2    Comorbidities Removal of hardware in lumbar spine 03/2021; Osteopenia; hysterectomy; Bilateral SI joint fusion 08/2020;    Examination-Activity Limitations Locomotion  Level    Examination-Participation Restrictions Community Activity    Stability/Clinical Decision Making Stable/Uncomplicated    Rehab Potential Excellent    PT Frequency 2x / week    PT Duration 8 weeks    PT Treatment/Interventions ADLs/Self Care Home  Management;Biofeedback;Cryotherapy;Electrical Stimulation;Moist Heat;Therapeutic activities;Therapeutic exercise;Neuromuscular re-education;Patient/family education;Manual techniques;Scar mobilization;Dry needling;Taping;Joint Manipulations    PT Next Visit Plan clearance to exercise from spinal doctor; therapy will incorporate back exercises to increase hip extension and gluteal strength;  manual work to the quads and hip flexors to elongate due to positive Thomas test, strengthen gluteals and back muscles; manual work on scar from back surgery; work on gluteus medius strength    PT Home Exercise Plan Access Code: [email or web address]    SWFUXNATF and Agree with Plan of Care Patient             Patient will benefit from skilled therapeutic intervention in order to improve the following deficits and impairments:  Decreased range of motion, Increased fascial restricitons, Decreased endurance, Decreased activity tolerance, Decreased strength, Decreased mobility, Decreased scar mobility, Pain  Visit Diagnosis: Muscle weakness (generalized)  Abnormal posture  Other abnormalities of gait and mobility     Problem List Patient Active Problem List   Diagnosis Date Noted   S/P lumbar microdiscectomy 04/30/2021   S/P hardware removal 09/12/2020   Radiculopathy 06/29/2019   Cervical radiculopathy 08/15/2018   Chronic sinusitis 07/27/2018   Constipation 07/27/2018   Chronic bilateral low back pain with bilateral sciatica 07/01/2018   Disorder of bone and cartilage 04/04/2018   Esophageal reflux 04/04/2018   Other and unspecified hyperlipidemia 04/04/2018   Other malaise and fatigue 04/04/2018   Pain in joint involving ankle and foot 04/04/2018   Pain in soft tissues of limb 04/04/2018   Posttraumatic stress disorder 04/04/2018   Raynaud's phenomenon 04/04/2018   Skin sensation disturbance 04/04/2018   Sleep disturbance 04/04/2018   Vitamin D deficiency 04/04/2018   Myoclonus, segmental 10/18/2017    Adjacent segment disease with spinal stenosis 10/04/2017   DDD (degenerative disc disease), lumbar 10/30/2016   Spondylolisthesis of lumbar region 08/21/2016   Overweight with body mass index (BMI) 25.0-29.9 07/22/2016   Capsulitis of metatarsophalangeal (MTP) joint of right foot 03/23/2016   Lumbar spinal stenosis 12/05/2015   Migraine 09/13/2012   Allergic rhinitis 09/13/2012   Neuropathy 09/13/2012    Earlie Counts, PT 06/04/21 11:47 AM  [facility] @ [address] [place], [place], [address] Phone: [phone]   Fax:  [phone]  Name: Sharon Logan MRN: [id number] Date of Birth: Jan 12, 1956

## 2021-06-04 NOTE — Patient Instructions (Signed)
Access Code: [email or web address] URL: [email or web address] Date: 06/04/2021 Prepared by: Earlie Counts  Exercises Hooklying Gluteal Sets - 1 x daily - 7 x weekly - 1 sets - 10 reps - 5 sec hold Bent Knee Fallouts - 1 x daily - 7 x weekly - 1 sets - 10 reps Supine Quadriceps Stretch with Strap on Table - 1 x daily - 7 x weekly - 1 sets - 2 reps - 30 sec hold Prone Terminal Knee Extension - 1 x daily - 7 x weekly - 1 sets - 10 reps [facility] [address], [place], [place] [address] Phone # [phone] Fax [phone]

## 2021-06-05 NOTE — Progress Notes (Signed)
Subjective:   Patient ID: Sharon Logan, female   DOB: 65 y.o.   MRN: [id number]   HPI 65 year old female presents the office today for follow-up evaluation of right foot pain.  She states she is still in some swelling point in the second MPJ and she is asking if there could be any infection in there.  She denies any redness or heat.  Area is tender.  She states that she is still interested in doing surgery once wait till its warmer out.  She has no new concerns otherwise.  She denies any fevers or other signs of infection.   Review of Systems  All other systems reviewed and are negative.  Past Medical History:  Diagnosis Date   Allergic rhinitis    Anxiety    Arthritis    Cancer (HCC)    skin   Chronic pain    Chronically dry eyes    Depression    Fibromyalgia    GERD (gastroesophageal reflux disease)    Headache    Osteoporosis    Vitamin D deficiency    Wears glasses     Past Surgical History:  Procedure Laterality Date   ABDOMINAL HYSTERECTOMY     BACK SURGERY     BUNIONECTOMY Bilateral    EYE SURGERY     laser surgery   NASAL SINUS SURGERY     x2   SACROILIAC JOINT FUSION Bilateral 09/12/2020   Procedure: REMOVAL OF BILATERAL PELVIC SCREWS;  Surgeon: Phylliss Bob, MD;  Location: [facility];  Service: Orthopedics;  Laterality: Bilateral;   SKIN CANCER EXCISION     face x3   TUBAL LIGATION       Current Outpatient Medications:    acetaminophen (TYLENOL) 325 MG tablet, Take 650 mg by mouth every 6 (six) hours as needed for moderate pain., Disp: , Rfl:    alendronate (FOSAMAX) 70 MG tablet, Take 70 mg by mouth every Saturday. (Patient not taking: Reported on 05/29/2021), Disp: , Rfl:    ARIPiprazole (ABILIFY) 2 MG tablet, Take 1/2-1 tab po qd, Disp: 30 tablet, Rfl: 1   b complex vitamins capsule, Take 2 capsules by mouth daily., Disp: , Rfl:    baclofen (LIORESAL) 10 MG tablet, Take 10 mg by mouth every 8 (eight) hours as needed for muscle spasms., Disp: , Rfl:     Carboxymethylcellulose Sodium (THERATEARS OP), Apply 1 drop to eye 4 (four) times daily as needed (dry eyes)., Disp: , Rfl:    cetirizine (ZYRTEC) 10 MG tablet, Take 10 mg by mouth daily as needed for allergies., Disp: , Rfl:    cholecalciferol (VITAMIN D) 25 MCG (1000 UNIT) tablet, Take 2,000 Units by mouth in the morning., Disp: , Rfl:    DRYSOL 20 % external solution, Apply 1 application topically daily as needed (sweat)., Disp: , Rfl:    Erenumab-aooe (AIMOVIG) 70 MG/ML SOAJ, Inject 70 mg into the skin every 28 (twenty-eight) days. (Patient not taking: No sig reported), Disp: 1 mL, Rfl: 0   fluticasone (FLONASE) 50 MCG/ACT nasal spray, Place 2 sprays into both nostrils 2 (two) times daily as needed for allergies or rhinitis., Disp: , Rfl:    levothyroxine (SYNTHROID) 50 MCG tablet, Take 1 tablet (50 mcg total) by mouth daily before breakfast. (Patient not taking: No sig reported), Disp: 30 tablet, Rfl: 3   magnesium 30 MG tablet, Take 30 mg by mouth 2 (two) times daily., Disp: , Rfl:    methocarbamol (ROBAXIN-750) 750 MG tablet, Take 1 tablet (  750 mg total) by mouth every 6 (six) hours as needed for muscle spasms., Disp: 30 tablet, Rfl: 2   nitroGLYCERIN (NITRO-DUR) 0.2 mg/hr patch, Place 1 patch (0.2 mg total) onto the skin daily. (Patient taking differently: Place 0.2 mg onto the skin daily as needed (bunion flare).), Disp: 30 patch, Rfl: 2   nortriptyline (PAMELOR) 10 MG capsule, Take 1 capsule at bedtime for one week, then may increase to 2 capsules at bedtime, Disp: 60 capsule, Rfl: 1   rizatriptan (MAXALT) 10 MG tablet, Take 10 mg by mouth daily as needed for migraine. , Disp: , Rfl:    saccharomyces boulardii (FLORASTOR) 250 MG capsule, Take 500 mg by mouth daily., Disp: , Rfl:   Allergies  Allergen Reactions   Gabapentin Other (See Comments)    Delirium, stroke like symptoms    Amitriptyline Other (See Comments)   Polymyxin B Other (See Comments)    Eyes go blood red    Zolpidem  Rash    "sleep walk"    Cymbalta [Duloxetine Hcl] Other (See Comments)    Headaches, constipation   Fluoxetine Other (See Comments)    CONSTIPATION    Lamotrigine Rash   Other Other (See Comments)    OTOBIOTIC > RED EYES   Pregabalin Rash    Itchy red rash on chest           Objective:  Physical Exam  General: AAO x3, NAD  Dermatological: Scar from prior surgery is well-healed.  There is no erythema or warmth noted to the foot.  Minimal edema to the second MPJ there is no signs of abscess.  No fluctuation crepitation.  Vascular: Dorsalis Pedis artery and Posterior Tibial artery pedal pulses are 2/4 bilateral with immedate capillary fill time.  There is no pain with calf compression, swelling, warmth, erythema.   Neruologic: Grossly intact via light touch bilateral.   Musculoskeletal: Decreased range of motion of the right first MPJ.  Discomfort of the second MPJ on the area with localized edema.  Muscular strength 5/5 in all groups tested bilateral.  Gait: Unassisted, Nonantalgic.       Assessment:   Capsulitis right foot     Plan:  -Treatment options discussed including all alternatives, risks, and complications -Etiology of symptoms were discussed -I do not think that there is infection given her symptoms however I will check blood work including sed rate, CBC, BMP, CRP.  If abnormal will likely order MRI.  Monitor for any signs or symptoms of infection otherwise. -Discussed surgical intervention she wants to wait until next year.  For now continue with supportive shoe gear and stretching exercises daily.  Trula Slade DPM

## 2021-06-06 DIAGNOSIS — M25552 Pain in left hip: Secondary | ICD-10-CM | POA: Diagnosis not present

## 2021-06-06 DIAGNOSIS — M25551 Pain in right hip: Secondary | ICD-10-CM | POA: Diagnosis not present

## 2021-06-11 ENCOUNTER — Encounter: Payer: Self-pay | Admitting: Physical Therapy

## 2021-06-11 ENCOUNTER — Other Ambulatory Visit: Payer: Self-pay

## 2021-06-11 ENCOUNTER — Ambulatory Visit: Payer: PPO | Admitting: Physical Therapy

## 2021-06-11 DIAGNOSIS — M6281 Muscle weakness (generalized): Secondary | ICD-10-CM | POA: Diagnosis not present

## 2021-06-11 DIAGNOSIS — R293 Abnormal posture: Secondary | ICD-10-CM

## 2021-06-11 DIAGNOSIS — R2689 Other abnormalities of gait and mobility: Secondary | ICD-10-CM

## 2021-06-11 NOTE — Therapy (Signed)
[facility] [place], [place], [address] Phone: [phone]   Fax:  [phone]  Physical Therapy Treatment  Patient Details  Name: Sharon Logan MRN: [id number] Date of Birth: [birth year] Referring Provider (PT): Faustino Congress, FNP-C   Encounter Date: 06/11/2021   PT End of Session - 06/11/21 1127     Visit Number 3    Date for PT Re-Evaluation 08/20/21    Authorization Type [employer]althte[employer]    Authorization - Visit Number 3    Authorization - Number of Visits 10    PT Start Time 1100    PT Stop Time 1140    PT Time Calculation (min) 40 min    Activity Tolerance Patient tolerated treatment well;No increased pain    Behavior During Therapy WFL for tasks assessed/performed             Past Medical History:  Diagnosis Date   Allergic rhinitis    Anxiety    Arthritis    Cancer (HCC)    skin   Chronic pain    Chronically dry eyes    Depression    Fibromyalgia    GERD (gastroesophageal reflux disease)    Headache    Osteoporosis    Vitamin D deficiency    Wears glasses     Past Surgical History:  Procedure Laterality Date   ABDOMINAL HYSTERECTOMY     BACK SURGERY     BUNIONECTOMY Bilateral    EYE SURGERY     laser surgery   NASAL SINUS SURGERY     x2   SACROILIAC JOINT FUSION Bilateral 09/12/2020   Procedure: REMOVAL OF BILATERAL PELVIC SCREWS;  Surgeon: Phylliss Bob, MD;  Location: [facility];  Service: Orthopedics;  Laterality: Bilateral;   SKIN CANCER EXCISION     face x3   TUBAL LIGATION      There were no vitals filed for this visit.   Subjective Assessment - 06/11/21 1110     Subjective I just found out that I have fractures in the pubic rami so I would like to learn stretches and ways to manage pain then be discharged.    Currently in Pain? Yes    Pain Score 5     Pain Orientation Right;Left;Anterior    Pain Descriptors / Indicators Burning;Aching;Tightness    Pain Type Acute  pain    Pain Onset More than a month ago    Pain Frequency Intermittent    Aggravating Factors  toward the evening when the body is tired    Pain Relieving Factors sit on a heating pad    Multiple Pain Sites No                [facility] PT Assessment - 06/11/21 0001       Assessment   Medical Diagnosis R10.32 left groin pain    Referring Provider (PT) Faustino Congress, FNP-C    Onset Date/Surgical Date --   03/2021   Prior Therapy not for pelvic floor      Precautions   Precautions Other (comment)    Precaution Comments removed hardward in back 03/2021      Restrictions   Weight Bearing Restrictions No      Prior Function   Level of Independence Independent    Vocation Retired    Leisure walks 15 min per day      Cognition   Overall Cognitive Status Within Functional Limits for tasks assessed  Observation/Other Assessments   Skin Integrity surgical sites on back are healed but restricted                           [facility] Adult PT Treatment/Exercise - 06/11/21 0001       Self-Care   Self-Care Other Self-Care Comments    Other Self-Care Comments  educated patient on where her fracture is and how things will put pressure on thefracture using the pelvic model; discussed with patient on exercises that are okay to do at the gym including the nustep, arm exercises, posture during arm exercises, no more using the hip abduction and adduction,      Therapeutic Activites    Therapeutic Activities Other Therapeutic Activities    Other Therapeutic Activities getting in and out of car with legs together, getting in and out of bed with legs together and log roll, sleep with pillows between knees and feet, going up and down steps step to step if it hurts, standing with equal weight on both legs. Not stand for long periods of time to not put as much pressure on the fractures.      Lumbar Exercises: Stretches   Active Hamstring Stretch Right;Left;1 rep;30 seconds     Active Hamstring Stretch Limitations supine    Single Knee to Chest Stretch Right;Left;1 rep;10 seconds    Single Knee to Chest Stretch Limitations supine    Piriformis Stretch Right;Left;1 rep;30 seconds    Piriformis Stretch Limitations supine push on knee      Lumbar Exercises: Aerobic   Nustep level 5 for 15 min. while assessing patient      Lumbar Exercises: Supine   Clam 10 reps;1 second   each side     Knee/Hip Exercises: Standing   Other Standing Knee Exercises sit to stand with 5# wiht squeezing the gluteals    Other Standing Knee Exercises dead lift without weights going to knees and flexion at hips 5x                     PT Education - 06/11/21 1142     Education Details Access Code: [email or web address] ; how to exercise at the gym; body mechanics for daily activities    Person(s) Educated Patient    Methods Explanation;Demonstration;Verbal cues;Handout    Comprehension Returned demonstration;Verbalized understanding              PT Short Term Goals - 06/11/21 1145       PT SHORT TERM GOAL #1   Title independent with initial HEP for stretches    Time 4    Period Weeks    Status Achieved      PT SHORT TERM GOAL #2   Title able to walk with groin pain decreased >/= 25%    Time 4    Period Weeks    Status Not Met               PT Long Term Goals - 06/11/21 1145       PT LONG TERM GOAL #1   Title independent with advanced HEP for hip strength and back strength to improve upright posture    Time 12    Period Weeks    Status Achieved      PT LONG TERM GOAL #2   Title able to walk with groin pain decreased >/= 75% due to improve hip extension and elongation of the hip flexors    Time  12    Period Weeks    Status Not Met      PT LONG TERM GOAL #3   Title able to passively extend her hips >/= 5 degrees so she has increased hip extension with walking    Time 12    Period Weeks    Status Not Met      PT LONG TERM GOAL #4   Title increased  hip strength >/= 4/5 so she is able to walk with pain level </= 2/10 and improved trunk extension to elongate the muscles that cross the groin    Time 12    Period Weeks    Status Not Met                   Plan - 06/11/21 1125     Clinical Impression Statement Patient has found out she has sacral fractures and fractures on the right pubic bone. She would like today to be her last day for things to heal but learn what she can do at home to manage pain and continue exercising. Patient was educated on body mechanics with daily tasks to not strain the pubic rami fracture. She was educated on proper technique to workout her arms at the gym. She has learned the proper stretches she can do.    Personal Factors and Comorbidities Comorbidity 2    Comorbidities Removal of hardware in lumbar spine 03/2021; Osteopenia; hysterectomy; Bilateral SI joint fusion 08/2020;    Examination-Activity Limitations Locomotion Level    Examination-Participation Restrictions Community Activity    Stability/Clinical Decision Making Stable/Uncomplicated    Rehab Potential Excellent    PT Treatment/Interventions ADLs/Self Care Home Management;Biofeedback;Cryotherapy;Electrical Stimulation;Moist Heat;Therapeutic activities;Therapeutic exercise;Neuromuscular re-education;Patient/family education;Manual techniques;Scar mobilization;Dry needling;Taping;Joint Manipulations    PT Next Visit Plan Discharge to HEP    PT Home Exercise Plan Access Code: [email or web address]    Recommended Other Services MD signed initial eval    Consulted and Agree with Plan of Care Patient             Patient will benefit from skilled therapeutic intervention in order to improve the following deficits and impairments:  Decreased range of motion, Increased fascial restricitons, Decreased endurance, Decreased activity tolerance, Decreased strength, Decreased mobility, Decreased scar mobility, Pain  Visit Diagnosis: Muscle weakness  (generalized)  Abnormal posture  Other abnormalities of gait and mobility     Problem List Patient Active Problem List   Diagnosis Date Noted   S/P lumbar microdiscectomy 04/30/2021   S/P hardware removal 09/12/2020   Radiculopathy 06/29/2019   Cervical radiculopathy 08/15/2018   Chronic sinusitis 07/27/2018   Constipation 07/27/2018   Chronic bilateral low back pain with bilateral sciatica 07/01/2018   Disorder of bone and cartilage 04/04/2018   Esophageal reflux 04/04/2018   Other and unspecified hyperlipidemia 04/04/2018   Other malaise and fatigue 04/04/2018   Pain in joint involving ankle and foot 04/04/2018   Pain in soft tissues of limb 04/04/2018   Posttraumatic stress disorder 04/04/2018   Raynaud's phenomenon 04/04/2018   Skin sensation disturbance 04/04/2018   Sleep disturbance 04/04/2018   Vitamin D deficiency 04/04/2018   Myoclonus, segmental 10/18/2017   Adjacent segment disease with spinal stenosis 10/04/2017   DDD (degenerative disc disease), lumbar 10/30/2016   Spondylolisthesis of lumbar region 08/21/2016   Overweight with body mass index (BMI) 25.0-29.9 07/22/2016   Capsulitis of metatarsophalangeal (MTP) joint of right foot 03/23/2016   Lumbar spinal stenosis 12/05/2015   Migraine 09/13/2012   Allergic rhinitis 09/13/2012  Neuropathy 09/13/2012    Earlie Counts, PT 06/11/21 11:47 AM  [facility] [place], [place], [address] Phone: [phone]   Fax:  [phone]  Name: Sharon Logan MRN: [id number] Date of Birth: 10/28/1955   PHYSICAL THERAPY DISCHARGE SUMMARY  Visits from Start of Care: 3  Current functional level related to goals / functional outcomes: See above. She has several pelvic fractures that need to be healed to continue therapy.    Remaining deficits: See above.    Education / Equipment: HEP   Patient agrees to discharge. Patient goals were not met.  Patient is being discharged due to a change in medical status. Thank you for the referral. Earlie Counts, PT 06/11/21 11:47 AM

## 2021-06-11 NOTE — Patient Instructions (Signed)
Access Code: [email or web address] URL: [email or web address] Date: 06/11/2021 Prepared by: Earlie Counts  Exercises Hooklying Gluteal Sets - 1 x daily - 3 x weekly - 1 sets - 10 reps - 5 sec hold Bent Knee Fallouts - 1 x daily - 4 x weekly - 1 sets - 10 reps Prone Terminal Knee Extension - 1 x daily - 4 x weekly - 1 sets - 10 reps Supine Hamstring Stretch - 1 x daily - 3 x weekly - 1 sets - 2 reps - 30 sec hold Supine Figure 4 Piriformis Stretch - 1 x daily - 3 x weekly - 1 sets - 2 reps - 30 sec hold Hooklying Single Knee to Chest Stretch - 1 x daily - 3 x weekly - 1 sets - 2 reps - 15 sec hold Half Deadlift with Kettlebell - 1 x daily - 3 x weekly - 1 sets - 5 reps [facility] [address] [place], [place] [address] Phone # [phone] Fax [phone]

## 2021-06-13 DIAGNOSIS — M797 Fibromyalgia: Secondary | ICD-10-CM | POA: Diagnosis not present

## 2021-06-13 DIAGNOSIS — M8080XD Other osteoporosis with current pathological fracture, unspecified site, subsequent encounter for fracture with routine healing: Secondary | ICD-10-CM | POA: Diagnosis not present

## 2021-06-13 DIAGNOSIS — S3210XD Unspecified fracture of sacrum, subsequent encounter for fracture with routine healing: Secondary | ICD-10-CM | POA: Diagnosis not present

## 2021-06-13 DIAGNOSIS — S32501D Unspecified fracture of right pubis, subsequent encounter for fracture with routine healing: Secondary | ICD-10-CM | POA: Diagnosis not present

## 2021-06-20 ENCOUNTER — Telehealth: Payer: Self-pay | Admitting: *Deleted

## 2021-06-20 ENCOUNTER — Other Ambulatory Visit: Payer: Self-pay | Admitting: Psychiatry

## 2021-06-20 DIAGNOSIS — F4310 Post-traumatic stress disorder, unspecified: Secondary | ICD-10-CM

## 2021-06-20 DIAGNOSIS — F331 Major depressive disorder, recurrent, moderate: Secondary | ICD-10-CM

## 2021-06-20 NOTE — Telephone Encounter (Signed)
-----   Message from Trula Slade, DPM sent at 06/04/2021  3:45 PM EST ----- I ordered a MRI if someone can please follow up on this. Thanks.

## 2021-06-20 NOTE — Telephone Encounter (Signed)
Called [facility] and they said that they will be reaching out to patient to schedule today or Monday,notified the patient thru vmessage.

## 2021-06-20 NOTE — Telephone Encounter (Signed)
90 day ok?

## 2021-06-25 ENCOUNTER — Encounter: Payer: PPO | Admitting: Physical Therapy

## 2021-06-26 DIAGNOSIS — Z6823 Body mass index (BMI) 23.0-23.9, adult: Secondary | ICD-10-CM | POA: Diagnosis not present

## 2021-06-26 DIAGNOSIS — M150 Primary generalized (osteo)arthritis: Secondary | ICD-10-CM | POA: Diagnosis not present

## 2021-06-26 DIAGNOSIS — M2550 Pain in unspecified joint: Secondary | ICD-10-CM | POA: Diagnosis not present

## 2021-06-26 DIAGNOSIS — M79641 Pain in right hand: Secondary | ICD-10-CM | POA: Diagnosis not present

## 2021-06-26 DIAGNOSIS — M797 Fibromyalgia: Secondary | ICD-10-CM | POA: Diagnosis not present

## 2021-06-26 DIAGNOSIS — M79642 Pain in left hand: Secondary | ICD-10-CM | POA: Diagnosis not present

## 2021-06-27 DIAGNOSIS — M797 Fibromyalgia: Secondary | ICD-10-CM | POA: Diagnosis not present

## 2021-06-27 DIAGNOSIS — S32501D Unspecified fracture of right pubis, subsequent encounter for fracture with routine healing: Secondary | ICD-10-CM | POA: Diagnosis not present

## 2021-06-27 DIAGNOSIS — S3210XD Unspecified fracture of sacrum, subsequent encounter for fracture with routine healing: Secondary | ICD-10-CM | POA: Diagnosis not present

## 2021-06-27 DIAGNOSIS — G8929 Other chronic pain: Secondary | ICD-10-CM | POA: Diagnosis not present

## 2021-07-03 ENCOUNTER — Other Ambulatory Visit: Payer: PPO

## 2021-07-04 ENCOUNTER — Ambulatory Visit
Admission: RE | Admit: 2021-07-04 | Discharge: 2021-07-04 | Disposition: A | Discharge status: Home / Self Care | Payer: Medicare HMO | Source: Ambulatory Visit | Attending: Podiatry | Admitting: Podiatry

## 2021-07-04 ENCOUNTER — Other Ambulatory Visit: Payer: Self-pay

## 2021-07-04 DIAGNOSIS — Z79899 Other long term (current) drug therapy: Secondary | ICD-10-CM | POA: Diagnosis not present

## 2021-07-04 DIAGNOSIS — M79671 Pain in right foot: Secondary | ICD-10-CM | POA: Diagnosis not present

## 2021-07-04 DIAGNOSIS — G894 Chronic pain syndrome: Secondary | ICD-10-CM | POA: Diagnosis not present

## 2021-07-04 DIAGNOSIS — M961 Postlaminectomy syndrome, not elsewhere classified: Secondary | ICD-10-CM | POA: Diagnosis not present

## 2021-07-04 DIAGNOSIS — L089 Local infection of the skin and subcutaneous tissue, unspecified: Secondary | ICD-10-CM

## 2021-07-04 DIAGNOSIS — M5136 Other intervertebral disc degeneration, lumbar region: Secondary | ICD-10-CM | POA: Diagnosis not present

## 2021-07-04 DIAGNOSIS — Z5181 Encounter for therapeutic drug level monitoring: Secondary | ICD-10-CM | POA: Diagnosis not present

## 2021-07-04 DIAGNOSIS — M779 Enthesopathy, unspecified: Secondary | ICD-10-CM

## 2021-07-04 IMAGING — MR MR FOOT*R* W/O CM
5 of 6 series · 33 of 40 positions shown · non-contrast
Comparison: [DATE]

CLINICAL DATA: Foot pain of the great toe. Surgery 3 years ago.

EXAM:
MRI OF THE RIGHT FOREFOOT WITHOUT CONTRAST
TECHNIQUE: Multiplanar, multisequence MR imaging of the right foot was
performed. No intravenous contrast was administered.

[Series 4: T1 · coronal · 4.0mm · 0.47mm/px · 8 of 36 slices shown (1 of 2)]
[im 1/36]
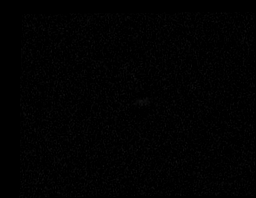
[im 6/36]
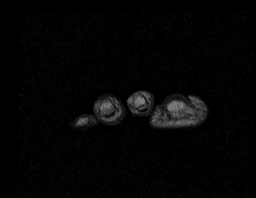
[im 11/36]
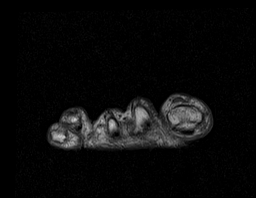
[im 16/36]
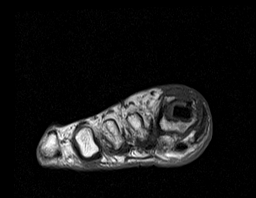
[im 21/36]
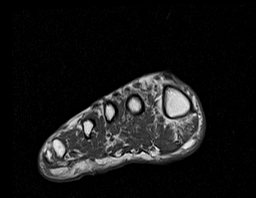
[im 26/36]
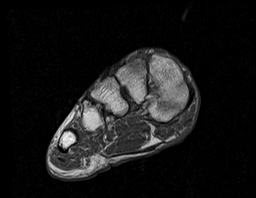
[im 31/36]
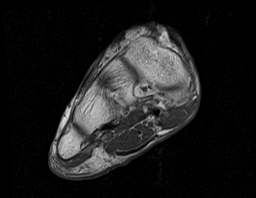
[im 36/36]
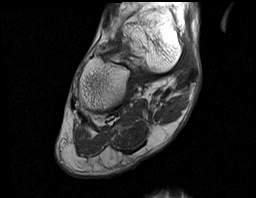

[Series 5: T2 fat-sat · coronal · 4.0mm · 0.23mm/px · 7 of 36 slices shown (1 of 3)]
[im 1/36]
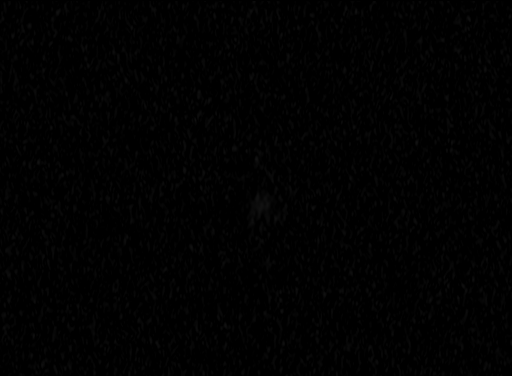
[im 6/36]
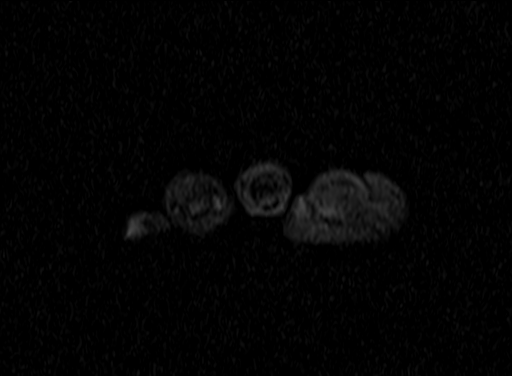
[im 12/36]
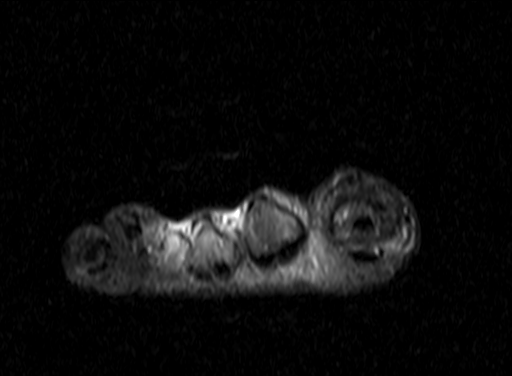
[im 18/36]
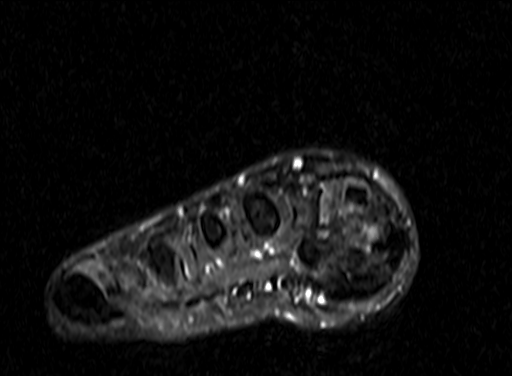
[im 24/36]
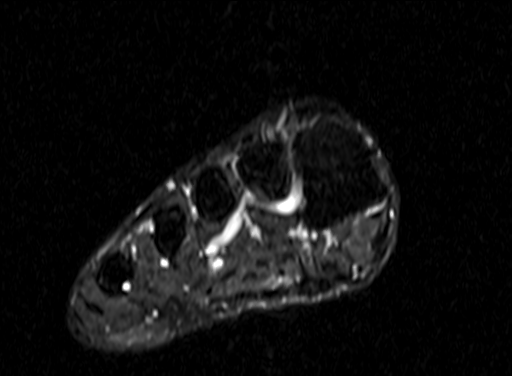
[im 30/36]
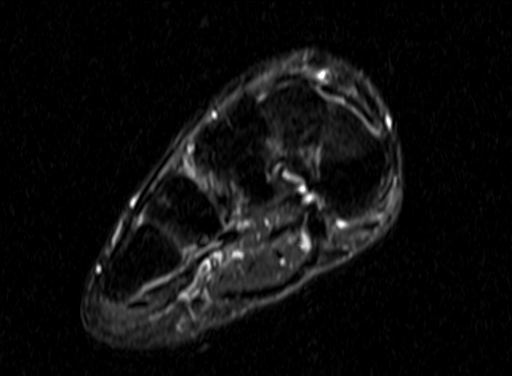
[im 36/36]
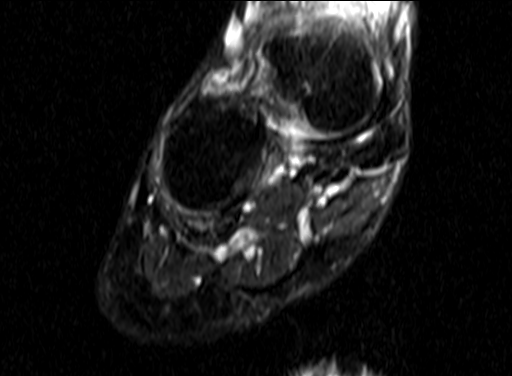

[Series 6: T2 fat-sat · coronal · 4.0mm · 0.23mm/px · 7 of 36 slices shown (2 of 3)]
[im 1/36]
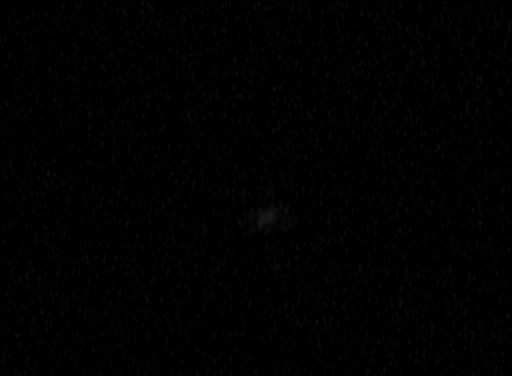
[im 6/36]
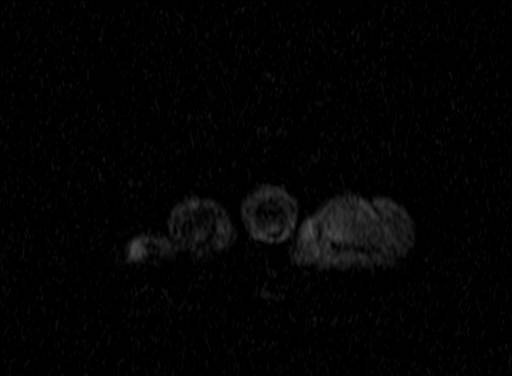
[im 12/36]
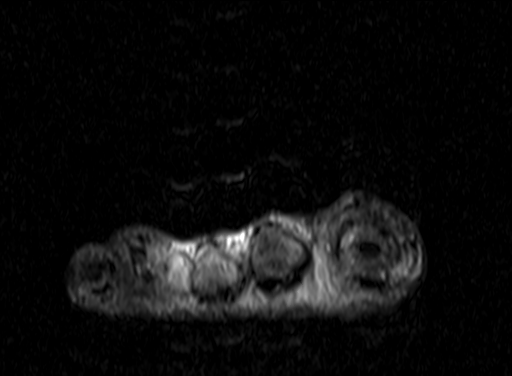
[im 18/36]
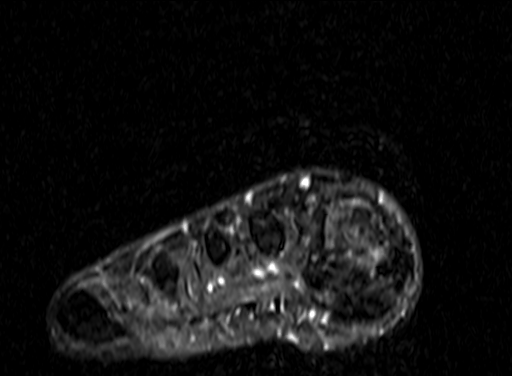
[im 24/36]
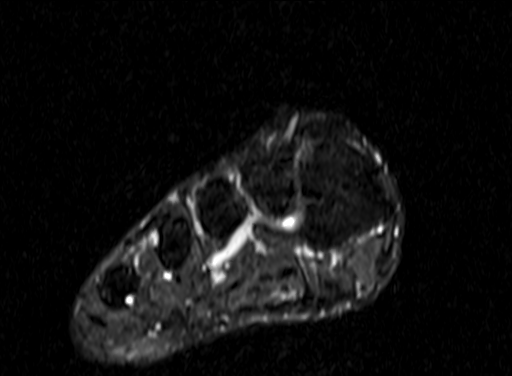
[im 30/36]
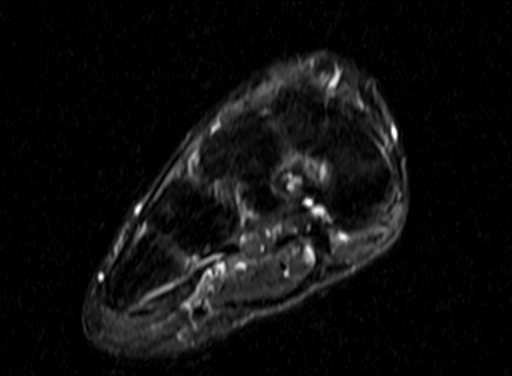
[im 36/36]
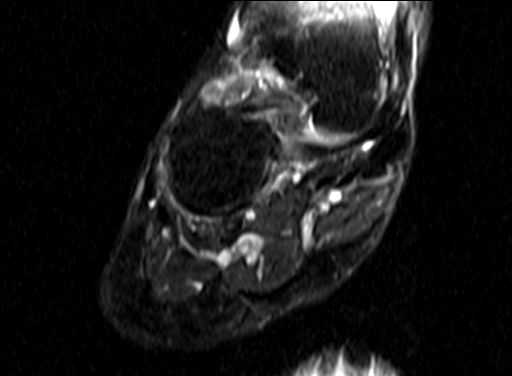

[Series 7: T2 fat-sat · axial · 3.0mm · 0.35mm/px · z∈[-63,+15]mm · 6 of 29 slices shown (3 of 3)]
[im 1/29]
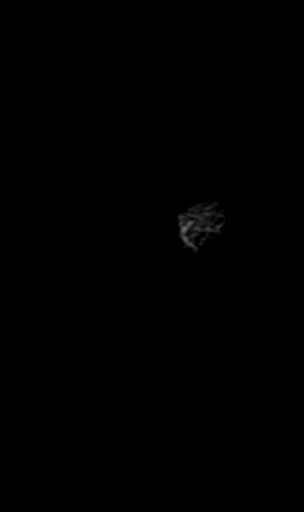
[im 6/29]
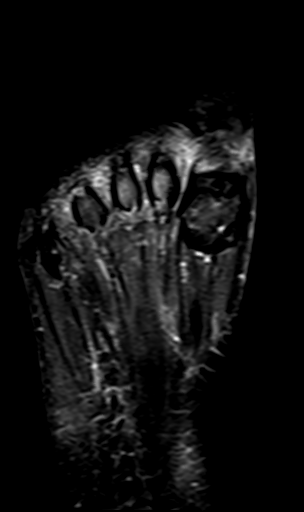
[im 12/29]
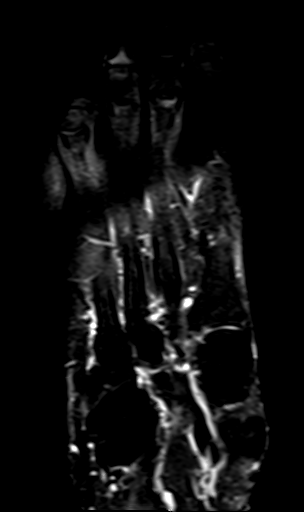
[im 17/29]
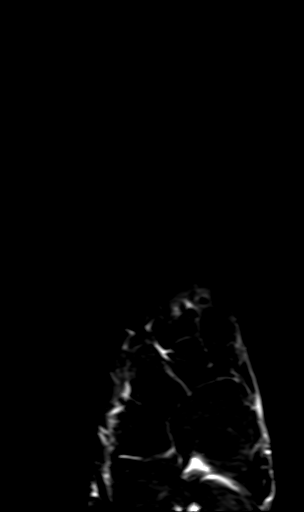
[im 23/29]
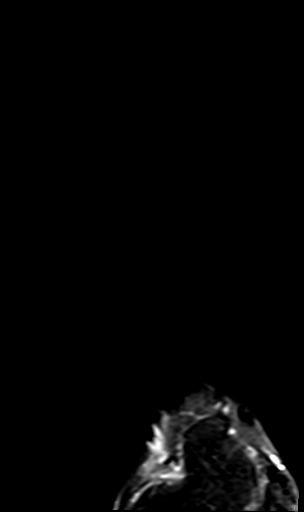
[im 29/29]
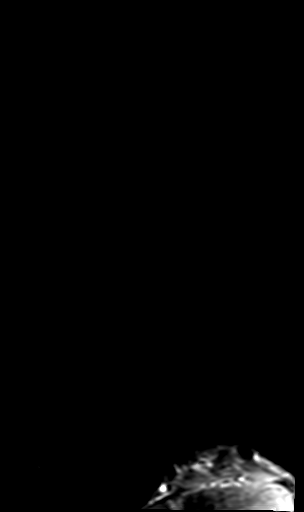

[Series 8: T1 · axial · 3.0mm · 0.35mm/px · z∈[-64,-3]mm · 5 of 29 slices shown (2 of 2)]
[im 1/29]
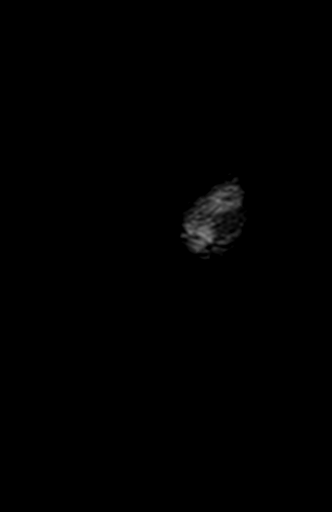
[im 6/29]
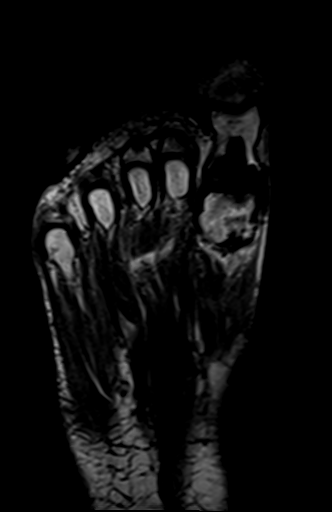
[im 12/29]
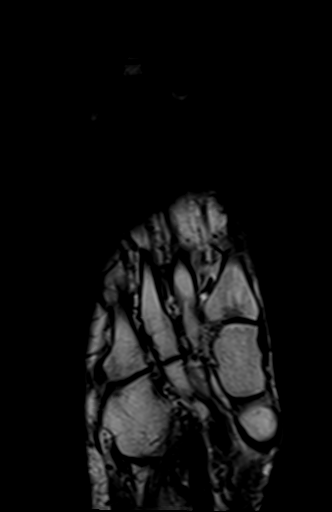
[im 17/29]
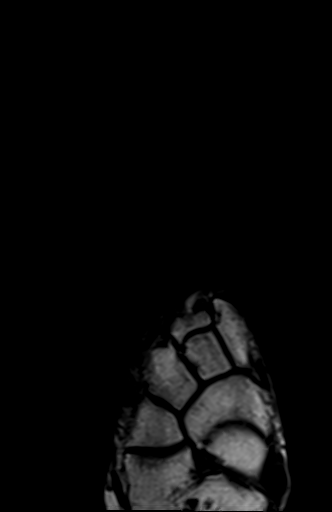
[im 23/29]
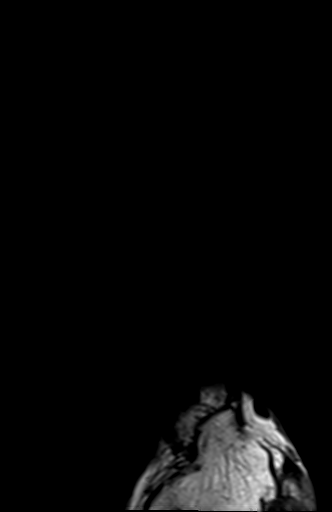

[33 of 40 positions shown; findings below may reference images not displayed]

FINDINGS: Bones/Joint/Cartilage

No acute fracture or dislocation. Normal alignment. No joint
effusion.

First MTP joint arthroplasty. Intermediate signal along the lateral
aspect of the metatarsal stem component concerning for loosening.

Ligaments

Collateral ligaments are intact. Lisfranc ligament is intact.

Muscles and Tendons

Flexor, peroneal and extensor compartment tendons are intact.
Muscles are normal.

Soft tissue
No fluid collection or hematoma. No soft tissue mass.
IMPRESSION: First MTP joint arthroplasty. Intermediate signal along the lateral
aspect of the metatarsal stem component concerning for loosening.
Recommend correlation with a more recent x-ray of the foot.

## 2021-07-09 ENCOUNTER — Encounter: Payer: PPO | Admitting: Physical Therapy

## 2021-07-10 DIAGNOSIS — G8929 Other chronic pain: Secondary | ICD-10-CM | POA: Diagnosis not present

## 2021-07-10 DIAGNOSIS — M797 Fibromyalgia: Secondary | ICD-10-CM | POA: Diagnosis not present

## 2021-07-10 DIAGNOSIS — S3210XD Unspecified fracture of sacrum, subsequent encounter for fracture with routine healing: Secondary | ICD-10-CM | POA: Diagnosis not present

## 2021-07-10 DIAGNOSIS — S32591D Other specified fracture of right pubis, subsequent encounter for fracture with routine healing: Secondary | ICD-10-CM | POA: Diagnosis not present

## 2021-07-11 ENCOUNTER — Ambulatory Visit (INDEPENDENT_AMBULATORY_CARE_PROVIDER_SITE_OTHER): Payer: Medicare HMO | Admitting: Psychiatry

## 2021-07-11 ENCOUNTER — Encounter: Payer: Self-pay | Admitting: Psychiatry

## 2021-07-11 DIAGNOSIS — F4310 Post-traumatic stress disorder, unspecified: Secondary | ICD-10-CM | POA: Diagnosis not present

## 2021-07-11 DIAGNOSIS — F339 Major depressive disorder, recurrent, unspecified: Secondary | ICD-10-CM

## 2021-07-11 NOTE — Progress Notes (Signed)
Sharon Logan [id number] 1956/01/25 66 y.o.  Subjective:   Patient ID:  Sharon Logan is a 66 y.o. (DOB 1956-04-26) female.  Chief Complaint:  Chief Complaint  Patient presents with   Follow-up    Anxiety and depression    HPI Sharon Logan presents to the office today for follow-up of depression and anxiety. She reports that that [place] offered additional care, to include pain management. She reports that she stopped Nortriptyline. She has used Abilify "off and on." She reports that the [place] started her on Cymbalta 30 mg BID she weeks ago. Her pain management specialist started her on Belbuca and this has been helpful.   She reports that her mood is starting to improve due to pain reduction. She reports that her energy and motivation remain low. She has been trying to gradually walk more more. She reports anxiety is improved compared to last spring. She reports that she has less patience for certain things, such as a long hold time on the phone. Sleep is unchanged overall. Her dog periodically wakes her up. Appetite has been ok. Reports that pain will periodically affect her pain. Concentration is fair. Has difficulty reading a book and used to like to read. Diminished interest in things. Anhedonia. Denies SI.   Continues to see [name], [facility] for therapy.   Past Medication Trials: Lexapro- "My anti-depressant of choice" and gives her energy. Was taking 2-3 years prior to surgery. Re-started one week ago. Has taken 10 mg and 20 mg doses in the past. Thinks it may have helped with pain in the past. Cymbalta- Started after back surgery. Has made her tired, even when reduced to 30 mg. Was having to take 2 hour naps. May have helped slightly with pain. Had HA, Constipation, Nausea Savella- muscle aches, rhabdomyolysis Effexor-HA, helped with excessive sweating.  Pristiq- May have increased depression. Prozac- Constipation. Causes her to feel cold.  Celexa Lexapro Zoloft- worsening  mood Paxil Wellbutrin- Did not cause HA's. Tolerated well and was effective for depression. Amitriptyline- Did not cause HA's. Weight gain. Effective.  Nortriptyline- Did not cause HA's. Effective. Lamictal- Rash Gabapentin -Rhabomyolysis Lyrica- Adverse reaction Buspar Trazodone- helped with sleep initiation but sleep was not restful. Ambien- Parasomnia Lunesta- Tolerated, effective at 2 mg dose Remeron- Dry eyes, internal restlessness. Stomach discomfort.  Clonidine Topamax- ineffective for headaches. Caused cognitive side effects.      [facility] Office Visit from 03/25/2021 in [facility] Visit from 12/24/2020 in [facility] Visit from 10/08/2020 in [facility]  PHQ-2 Total Score 0 2 2  PHQ-9 Total Score -- -- 8      Flowsheet Row Admission (Discharged) from 04/30/2021 in [facility] from 04/21/2021 in [facility] PREADMISSION TESTING Admission (Discharged) from 09/12/2020 in [facility] No Risk No Risk No Risk        Review of Systems:  Review of Systems  Musculoskeletal:  Negative for gait problem.       Some improvement in pain with Belbuca  Neurological:  Negative for tremors.  Psychiatric/Behavioral:         Please refer to HPI   Medications: I have reviewed the patient's current medications.  Current Outpatient Medications  Medication Sig Dispense Refill   acetaminophen (TYLENOL) 325 MG tablet Take 650 mg by mouth every 6 (six) hours  as needed for moderate pain.     b complex vitamins capsule Take 2 capsules by mouth daily.     Buprenorphine HCl (BELBUCA) 300 MCG FILM 1 film     Carboxymethylcellulose Sodium (THERATEARS OP) Apply 1 drop to eye 4 (four) times daily as needed (dry eyes).     cetirizine (ZYRTEC) 10 MG tablet Take 10  mg by mouth daily as needed for allergies.     cholecalciferol (VITAMIN D) 25 MCG (1000 UNIT) tablet Take 2,000 Units by mouth in the morning.     DULoxetine (CYMBALTA) 30 MG capsule Take 30 mg by mouth 2 (two) times daily.     fluticasone (FLONASE) 50 MCG/ACT nasal spray Place 2 sprays into both nostrils 2 (two) times daily as needed for allergies or rhinitis.     magnesium 30 MG tablet Take 30 mg by mouth 2 (two) times daily.     rizatriptan (MAXALT) 10 MG tablet Take 10 mg by mouth daily as needed for migraine.      saccharomyces boulardii (FLORASTOR) 250 MG capsule Take 500 mg by mouth daily.     alendronate (FOSAMAX) 70 MG tablet Take 70 mg by mouth every Saturday. (Patient not taking: Reported on 05/29/2021)     DRYSOL 20 % external solution Apply 1 application topically daily as needed (sweat).     nitroGLYCERIN (NITRO-DUR) 0.2 mg/hr patch Place 1 patch (0.2 mg total) onto the skin daily. (Patient taking differently: Place 0.2 mg onto the skin daily as needed (bunion flare).) 30 patch 2   No current facility-administered medications for this visit.    Medication Side Effects: None  Allergies:  Allergies  Allergen Reactions   Gabapentin Other (See Comments)    Delirium, stroke like symptoms    Amitriptyline Other (See Comments)   Polymyxin B Other (See Comments)    Eyes go blood red    Zolpidem Rash    "sleep walk"    Cymbalta [Duloxetine Hcl] Other (See Comments)    Headaches, constipation   Fluoxetine Other (See Comments)    CONSTIPATION    Lamotrigine Rash   Other Other (See Comments)    OTOBIOTIC > RED EYES   Pregabalin Rash    Itchy red rash on chest     Past Medical History:  Diagnosis Date   Allergic rhinitis    Anxiety    Arthritis    Cancer (HCC)    skin   Chronic pain    Chronically dry eyes    Depression    Fibromyalgia    GERD (gastroesophageal reflux disease)    Headache    Osteoporosis    Vitamin D deficiency    Wears glasses     Past  Medical History, Surgical history, Social history, and Family history were reviewed and updated as appropriate.   Please see review of systems for further details on the patient's review from today.   Objective:   Physical Exam:  There were no vitals taken for this visit.  Physical Exam Constitutional:      General: She is not in acute distress. Musculoskeletal:        General: No deformity.  Neurological:     Mental Status: She is alert and oriented to person, place, and time.     Coordination: Coordination normal.  Psychiatric:        Attention and Perception: Attention and perception normal. She does not perceive auditory or visual hallucinations.        Mood and Affect: Affect  is not labile, blunt, angry or inappropriate.        Speech: Speech normal.        Behavior: Behavior normal.        Thought Content: Thought content normal. Thought content is not paranoid or delusional. Thought content does not include homicidal or suicidal ideation. Thought content does not include homicidal or suicidal plan.        Cognition and Memory: Cognition and memory normal.        Judgment: Judgment normal.     Comments: Insight intact Mood presents as less anxious and less depressed compared to last exam    Lab Review:     Component Value Date/Time   NA 142 06/03/2021 1623   K 4.6 06/03/2021 1623   CL 104 06/03/2021 1623   CO2 24 06/03/2021 1623   GLUCOSE 103 (H) 06/03/2021 1623   GLUCOSE 93 04/21/2021 0910   BUN 21 06/03/2021 1623   CREATININE 0.71 06/03/2021 1623   CALCIUM 9.5 06/03/2021 1623   PROT 7.4 04/21/2021 0910   ALBUMIN 3.8 04/21/2021 0910   AST 26 04/21/2021 0910   ALT 21 04/21/2021 0910   ALKPHOS 120 04/21/2021 0910   BILITOT 0.7 04/21/2021 0910   GFRNONAA >60 04/21/2021 0910   GFRAA >60 06/26/2019 1008       Component Value Date/Time   WBC 7.3 06/03/2021 1622   WBC 8.9 04/21/2021 0910   RBC 4.10 06/03/2021 1622   RBC 4.14 04/21/2021 0910   HGB 12.1  06/03/2021 1622   HCT 37.6 06/03/2021 1622   PLT 336 06/03/2021 1622   MCV 92 06/03/2021 1622   MCH 29.5 06/03/2021 1622   MCH 30.0 04/21/2021 0910   MCHC 32.2 06/03/2021 1622   MCHC 30.8 04/21/2021 0910   RDW 12.8 06/03/2021 1622   LYMPHSABS 2.0 06/03/2021 1622   MONOABS 0.5 04/21/2021 0910   EOSABS 0.1 06/03/2021 1622   BASOSABS 0.0 06/03/2021 1622    No results found for: POCLITH, LITHIUM   No results found for: PHENYTOIN, PHENOBARB, VALPROATE, CBMZ   .res Assessment: Plan:   Pt seen for 30 minutes and she reports improved mood and anxiety since starting Cymbalta and Belbuca. She reports that she may be receiving care through the [place] and will learn more about this next week. She reports that she will follow-up in this office on an as needed basis.  Patient advised to contact office with any questions, adverse effects, or acute worsening in signs and symptoms.   Kasheena was seen today for follow-up.  Diagnoses and all orders for this visit:  PTSD (post-traumatic stress disorder)  Major depression, recurrent, chronic ([place])     Please see After Visit Summary for patient specific instructions.  Future Appointments  Date Time Provider [place]  07/15/2021 11:20 AM Raulkar, Clide Deutscher, MD [facility] CPR  07/24/2021 11:00 AM Edgardo Roys, PsyD [facility] CPR  09/01/2021  2:00 PM Edgardo Roys, PsyD [facility] CPR  09/15/2021  1:00 PM Edgardo Roys, PsyD [facility] CPR  10/06/2021  9:50 AM Pieter Partridge, DO [facility] None    No orders of the defined types were placed in this encounter.   -------------------------------

## 2021-07-15 ENCOUNTER — Other Ambulatory Visit: Payer: Self-pay

## 2021-07-15 ENCOUNTER — Encounter: Payer: Self-pay | Admitting: Physical Medicine and Rehabilitation

## 2021-07-15 ENCOUNTER — Encounter: Payer: Medicare HMO | Attending: Physical Medicine and Rehabilitation | Admitting: Physical Medicine and Rehabilitation

## 2021-07-15 VITALS — BP 136/77 | HR 66 | Temp 98.0°F | Ht 64.0 in | Wt 137.8 lb

## 2021-07-15 DIAGNOSIS — M797 Fibromyalgia: Secondary | ICD-10-CM | POA: Insufficient documentation

## 2021-07-15 DIAGNOSIS — M7918 Myalgia, other site: Secondary | ICD-10-CM | POA: Diagnosis not present

## 2021-07-15 NOTE — Progress Notes (Signed)

## 2021-07-21 DIAGNOSIS — M25511 Pain in right shoulder: Secondary | ICD-10-CM | POA: Diagnosis not present

## 2021-07-21 DIAGNOSIS — M19011 Primary osteoarthritis, right shoulder: Secondary | ICD-10-CM | POA: Diagnosis not present

## 2021-07-21 DIAGNOSIS — M25711 Osteophyte, right shoulder: Secondary | ICD-10-CM | POA: Diagnosis not present

## 2021-07-21 DIAGNOSIS — M25311 Other instability, right shoulder: Secondary | ICD-10-CM | POA: Diagnosis not present

## 2021-07-23 ENCOUNTER — Encounter: Payer: PPO | Admitting: Physical Therapy

## 2021-07-24 ENCOUNTER — Other Ambulatory Visit: Payer: Self-pay

## 2021-07-24 ENCOUNTER — Encounter: Payer: Medicare HMO | Attending: Psychology | Admitting: Psychology

## 2021-07-24 DIAGNOSIS — M5136 Other intervertebral disc degeneration, lumbar region: Secondary | ICD-10-CM | POA: Diagnosis not present

## 2021-07-24 DIAGNOSIS — F331 Major depressive disorder, recurrent, moderate: Secondary | ICD-10-CM

## 2021-07-24 DIAGNOSIS — M797 Fibromyalgia: Secondary | ICD-10-CM | POA: Diagnosis not present

## 2021-07-24 DIAGNOSIS — M5412 Radiculopathy, cervical region: Secondary | ICD-10-CM

## 2021-07-24 DIAGNOSIS — M7918 Myalgia, other site: Secondary | ICD-10-CM | POA: Insufficient documentation

## 2021-08-01 DIAGNOSIS — G894 Chronic pain syndrome: Secondary | ICD-10-CM | POA: Diagnosis not present

## 2021-08-01 DIAGNOSIS — M961 Postlaminectomy syndrome, not elsewhere classified: Secondary | ICD-10-CM | POA: Diagnosis not present

## 2021-08-01 DIAGNOSIS — M5136 Other intervertebral disc degeneration, lumbar region: Secondary | ICD-10-CM | POA: Diagnosis not present

## 2021-08-07 DIAGNOSIS — Z6823 Body mass index (BMI) 23.0-23.9, adult: Secondary | ICD-10-CM | POA: Diagnosis not present

## 2021-08-07 DIAGNOSIS — M797 Fibromyalgia: Secondary | ICD-10-CM | POA: Diagnosis not present

## 2021-08-07 DIAGNOSIS — M150 Primary generalized (osteo)arthritis: Secondary | ICD-10-CM | POA: Diagnosis not present

## 2021-08-07 DIAGNOSIS — M2550 Pain in unspecified joint: Secondary | ICD-10-CM | POA: Diagnosis not present

## 2021-08-19 ENCOUNTER — Encounter: Payer: Self-pay | Admitting: Psychology

## 2021-08-19 NOTE — Progress Notes (Signed)
Neuropsychological Consultation   Patient:   Sharon Logan   DOB:   11/15/1955  MR Number:  [id number]  Location:  [facility] [place], [place] [id number] [facility] [place] [address] Dept: [phone]           Date of Service:   07/24/2021  Start Time:   11 AM End Time:   12 PM  Today's visit was an in person visit was conducted in my outpatient clinic office with the patient myself present.  Provider/Observer:  Ilean Skill, Psy.D.       Clinical Neuropsychologist       Billing Code/Service: 96158/[id number]  Chief Complaint:    Sharon Logan is a 66 year old female referred by Leeroy Cha, MD in conjunction with her PCP Marda Stalker, PA-C for neuropsychological consultation and therapeutic interventions due to residual and ongoing chronic pain issues in the setting of fibromyalgia and prior lumbar back surgery with continued chronic pain.  The patient is also been coping with cervical myofascial pain syndrome and intolerance to cold.  The patient describes significant fatigue, difficulty walking and moving and has described her muscles as feeling like "dead weight" in both her arms and legs.  The patient reports that the symptoms have been going on for at least 4 years but has become more severe since August of this year.  2 years ago she had back surgery with fusion at L4-L5 and L5-S1.  The patient has recently been tried on oxycodone for pain and has been using it for 2 months to see if it was helpful.  She is taking this twice per day.  The patient reports that she was diagnosed with fibromyalgia in 2010 and has had periodic flareups particular around times of cold and that these flareups come and go.  Current medications include baclofen, oxycodone and Wellbutrin.  Patient has tried TENS units before and there have been discussions about possible spinal cord stimulator trials  but this has not been pursued at this point.  Patient with significant depressive response along with her chronic pain and cervical myofascial pain.  She has just begun using trigger point injections with her physiatrist.  Reason for Service:  [name] is a 66 year old female referred by Leeroy Cha, MD in conjunction with her PCP Marda Stalker, PA-C for neuropsychological consultation and therapeutic interventions due to residual and ongoing chronic pain issues in the setting of fibromyalgia and prior lumbar back surgery with continued chronic pain.  The patient is also been coping with cervical myofascial pain syndrome and intolerance to cold.  The patient describes significant fatigue, difficulty walking and moving and has described her muscles as feeling like "dead weight" in both her arms and legs.  The patient reports that the symptoms have been going on for at least 4 years but has become more severe since August of this year.  2 years ago she had back surgery with fusion at L4-L5 and L5-S1.  The patient has recently been tried on oxycodone for pain and has been using it for 2 months to see if it was helpful.  She is taking this twice per day.  The patient reports that she was diagnosed with fibromyalgia in 2010 and has had periodic flareups particular around times of cold and that these flareups come and go.  Current medications include baclofen, oxycodone and Wellbutrin.  Patient has tried TENS units before and there have been discussions  about possible spinal cord stimulator trials but this has not been pursued at this point.  Patient with significant depressive response along with her chronic pain and cervical myofascial pain.  She has just begun using trigger point injections with her physiatrist.  The patient reports that she has had multiple back surgeries in 2018, 2019 and 2020.  Her current symptoms have become exacerbated and worsened in August of this year and reports that she is  now in constant pain and she has been talking and addressing this issue with her orthopedist about the possibility of removing the hardware that was placed previously.  The patient describes her pain as sharp, burning, stabbing, tingling and aching.  The patient does have a past medical history including depression and anxiety type symptoms along with her cervical and lumbar region difficulties.  The patient has been identified with vitamin D deficiency, neuropathy, radiculopathy, posttraumatic stress disorder.  We did not get into the elements of her PTSD during this visit.  The patient reports that she has significant difficulty falling asleep and will take up to 2 hours to fall asleep and only gets about 4 to 5 hours of sleep each night.  The patient reports that her appetite is good and that her memory and cognition are good.  Most recent imaging was a CT scan of her back done in February 2022 the impression/interpretation of these findings showed Disc levels: L1-2: Mild disc degeneration.  Negative for stenosis   L2-3: Mild retrolisthesis. Disc bulging and mild facet degeneration. Negative for stenosis.   L3-4: Pedicle screw and interbody fusion. Metal interbody spacer with subsidence into the endplates at N3-9. No solid bony fusion present. Pedicle screws in satisfactory position   L4-5: Pedicle screw and interbody fusion. Solid interbody fusion. Hardware in satisfactory position   L5-S1: Pedicle screw and interbody fusion. Persistent gas in the disc space. Probable pseudarthrosis at this level.   Bilateral screws extend into the iliac bone across the SI joint bilaterally. No hardware lucency or fracture.   IMPRESSION: Pedicle screw and interbody fusion L3-4. Subsidence of the spacer without bony fusion   Solid fusion L4-5   Pedicle screw and interbody fusion L5-S1 with probable pseudarthrosis.   Behavioral Observation: [name]  presents as a 66 y.o.-year-old Right  Caucasian Female who appeared her stated age. her dress was Appropriate and she was Well Groomed and her manners were Appropriate to the situation.  her participation was indicative of Appropriate and Attentive behaviors.  There were physical disabilities noted.  she displayed an appropriate level of cooperation and motivation.     Interactions:    Active Appropriate  Attention:   within normal limits and attention span and concentration were age appropriate  Memory:   within normal limits; recent and remote memory intact  Visuo-spatial:  not examined  Speech (Volume):  normal  Speech:   normal; normal  Thought Process:  Coherent and Relevant  Though Content:  WNL; not suicidal and not homicidal  Orientation:   person, place, time/date, and situation  Judgment:   Good  Planning:   Good  Affect:    Appropriate  Mood:    Dysphoric  Insight:   Good  Intelligence:   high  Marital Status/Living: The patient was born and raised in [place] along with 2 siblings.  She currently lives by herself.  The patient was married previously for 14 years and is now widowed.  The patient is a 95 year old daughter and a 43 year old son.  Current Employment: The patient is retired.  Past Employment:  The patient worked for the [employer] as a Scientist, clinical (histocompatibility and immunogenetics) for 34 years.  Hobbies and interest have included reading, walking and some gardening.  The patient was also in the [employer] serving in the [employer] between [place].  Highest rank was E4 and her rank at discharge was E4.  She had an honorable discharge.  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  Substance Use:  No concerns of substance abuse are reported.  Patient denies any substance use or abuse.  She denies any alcohol consumption or tobacco use.  Education:   The patient completed her bachelor's degree in business achieving at ending GPA of 3.26.  She has attended [employer] in  [place].  Her best subject was history and reports that she did have minor difficulties relative to her good academic performance in math.  Medical History:   Past Medical History:  Diagnosis Date   Allergic rhinitis    Anxiety    Arthritis    Cancer ([place])    skin   Chronic pain    Chronically dry eyes    Depression    Fibromyalgia    GERD (gastroesophageal reflux disease)    Headache    Osteoporosis    Vitamin D deficiency    Wears glasses          Patient Active Problem List   Diagnosis Date Noted   S/P lumbar microdiscectomy 04/30/2021   S/P hardware removal 09/12/2020   Radiculopathy 06/29/2019   Cervical radiculopathy 08/15/2018   Chronic sinusitis 07/27/2018   Constipation 07/27/2018   Chronic bilateral low back pain with bilateral sciatica 07/01/2018   Disorder of bone and cartilage 04/04/2018   Esophageal reflux 04/04/2018   Other and unspecified hyperlipidemia 04/04/2018   Other malaise and fatigue 04/04/2018   Pain in joint involving ankle and foot 04/04/2018   Pain in soft tissues of limb 04/04/2018   Posttraumatic stress disorder 04/04/2018   Raynaud's phenomenon 04/04/2018   Skin sensation disturbance 04/04/2018   Sleep disturbance 04/04/2018   Vitamin D deficiency 04/04/2018   Myoclonus, segmental 10/18/2017   Adjacent segment disease with spinal stenosis 10/04/2017   DDD (degenerative disc disease), lumbar 10/30/2016   Spondylolisthesis of lumbar region 08/21/2016   Overweight with body mass index (BMI) 25.0-29.9 07/22/2016   Capsulitis of metatarsophalangeal (MTP) joint of right foot 03/23/2016   Lumbar spinal stenosis 12/05/2015   Migraine 09/13/2012   Allergic rhinitis 09/13/2012   Neuropathy 09/13/2012              Abuse/Trauma History: Patient does have a history of previous diagnosis of posttraumatic stress disorder but we have not reviewed this yet.  Psychiatric History:  Patient has a past psychiatric history including depression  and anxiety as well as PTSD.  Family Med/Psych History:  Family History  Problem Relation Age of Onset   Depression Mother    Heart attack Mother    COPD Mother    Depression Father    Heart attack Father    ADD / ADHD Son     Risk of Suicide/Violence: low patient denies any suicidal or homicidal ideation.  Impression/DX:  AYANI OSPINA is a 67 year old female referred by Leeroy Cha, MD in conjunction with her PCP Marda Stalker, PA-C for neuropsychological consultation and therapeutic interventions due to residual and ongoing chronic pain issues in the setting of fibromyalgia and prior lumbar back  surgery with continued chronic pain.  The patient is also been coping with cervical myofascial pain syndrome and intolerance to cold.  The patient describes significant fatigue, difficulty walking and moving and has described her muscles as feeling like "dead weight" in both her arms and legs.  The patient reports that the symptoms have been going on for at least 4 years but has become more severe since August of this year.  2 years ago she had back surgery with fusion at L4-L5 and L5-S1.  The patient has recently been tried on oxycodone for pain and has been using it for 2 months to see if it was helpful.  She is taking this twice per day.  The patient reports that she was diagnosed with fibromyalgia in 2010 and has had periodic flareups particular around times of cold and that these flareups come and go.  Current medications include baclofen, oxycodone and Wellbutrin.  Patient has tried TENS units before and there have been discussions about possible spinal cord stimulator trials but this has not been pursued at this point.  Patient with significant depressive response along with her chronic pain and cervical myofascial pain.  She has just begun using trigger point injections with her physiatrist.  Disposition/Plan:  Today was our first therapeutic visit with this patient.  The patient describes  ongoing chronic pain issues with a history of fibromyalgia and prior lumbar back surgery with continued chronic pain.  We have begun working on pain management techniques particularly around the residual or resulting major depressive episodes.  Cognitive/behavioral psychotherapeutic interventions and pain management techniques are addressed as well as looking at foundational issues including dietary, behavioral and sleep hygiene issues.  The patient continues to be motivated and we have been actively working on therapeutic interventions.  Diagnosis:    Fibromyalgia  Cervical myofascial pain syndrome  Cervical radiculopathy  DDD (degenerative disc disease), lumbar  Moderate episode of recurrent major depressive disorder ([place])         Electronically Signed   _______________________ Ilean Skill, Psy.D. Clinical Neuropsychologist

## 2021-08-28 DIAGNOSIS — M25376 Other instability, unspecified foot: Secondary | ICD-10-CM | POA: Diagnosis not present

## 2021-08-28 DIAGNOSIS — M79671 Pain in right foot: Secondary | ICD-10-CM | POA: Diagnosis not present

## 2021-08-28 DIAGNOSIS — T849XXA Unspecified complication of internal orthopedic prosthetic device, implant and graft, initial encounter: Secondary | ICD-10-CM | POA: Diagnosis not present

## 2021-09-01 ENCOUNTER — Encounter: Payer: Medicare HMO | Attending: Psychology | Admitting: Psychology

## 2021-09-01 ENCOUNTER — Encounter: Payer: Self-pay | Admitting: Psychology

## 2021-09-01 ENCOUNTER — Other Ambulatory Visit: Payer: Self-pay

## 2021-09-01 DIAGNOSIS — M5412 Radiculopathy, cervical region: Secondary | ICD-10-CM

## 2021-09-01 DIAGNOSIS — M797 Fibromyalgia: Secondary | ICD-10-CM

## 2021-09-01 DIAGNOSIS — M7918 Myalgia, other site: Secondary | ICD-10-CM

## 2021-09-01 NOTE — Progress Notes (Signed)
Neuropsychological Consultation ? ? ?Patient:   Sharon Logan  ? ?DOB:   October 01, 1955 ? ?MR Number:  [id number] ? ?Location:  [facility] ?[place], [address] [place] ?[id number] ?[place] [address] ?Dept: [phone] ?          ?Date of Service:   09/01/2021 ? ?Start Time:   2 PM ?End Time:   3 PM ? ?Today's visit was an in person visit was conducted in my outpatient clinic office with the patient myself present. ? ?Provider/Observer:  Ilean Skill, Psy.D.   ?    Clinical Neuropsychologist ?     ? ?Billing Code/Service: 96158/[id number] ? ? ?Reason for Service:  Sharon Logan is a 66 year old female referred by Leeroy Cha, MD in conjunction with her PCP Marda Stalker, PA-C for neuropsychological consultation and therapeutic interventions due to residual and ongoing chronic pain issues in the setting of fibromyalgia and prior lumbar back surgery with continued chronic pain.  The patient is also been coping with cervical myofascial pain syndrome and intolerance to cold.  The patient describes significant fatigue, difficulty walking and moving and has described her muscles as feeling like "dead weight" in both her arms and legs.  The patient reports that the symptoms have been going on for at least 4 years but has become more severe since August of this year.  2 years ago she had back surgery with fusion at L4-L5 and L5-S1.  The patient has recently been tried on oxycodone for pain and has been using it for 2 months to see if it was helpful.  She is taking this twice per day.  The patient reports that she was diagnosed with fibromyalgia in 2010 and has had periodic flareups particular around times of cold and that these flareups come and go.  Current medications include baclofen, oxycodone and Wellbutrin.  Patient has tried TENS units before and there have been discussions about possible spinal cord stimulator trials  but this has not been pursued at this point.  Patient with significant depressive response along with her chronic pain and cervical myofascial pain.  She has just begun using trigger point injections with her physiatrist. ? ?The patient reports that she has had multiple back surgeries in 2018, 2019 and 2020.  Her current symptoms have become exacerbated and worsened in August of this year and reports that she is now in constant pain and she has been talking and addressing this issue with her orthopedist about the possibility of removing the hardware that was placed previously.  The patient describes her pain as sharp, burning, stabbing, tingling and aching. ? ?The patient does have a past medical history including depression and anxiety type symptoms along with her cervical and lumbar region difficulties.  The patient has been identified with vitamin D deficiency, neuropathy, radiculopathy, posttraumatic stress disorder.  We did not get into the elements of her PTSD during this visit. ? ?The patient reports that she has significant difficulty falling asleep and will take up to 2 hours to fall asleep and only gets about 4 to 5 hours of sleep each night.  The patient reports that her appetite is good and that her memory and cognition are good. ? ?Most recent imaging was a CT scan of her back done in February 2022 the impression/interpretation of these findings showed Disc levels: L1-2: Mild disc degeneration.  Negative for stenosis ?  ?L2-3: Mild retrolisthesis. Disc bulging and mild  facet degeneration. ?Negative for stenosis. ?  ?L3-4: Pedicle screw and interbody fusion. Metal interbody spacer ?with subsidence into the endplates at Y6-3. No solid bony fusion ?present. Pedicle screws in satisfactory position ?  ?L4-5: Pedicle screw and interbody fusion. Solid interbody fusion. ?Hardware in satisfactory position ?  ?L5-S1: Pedicle screw and interbody fusion. Persistent gas in the ?disc space. Probable pseudarthrosis at  this level. ?  ?Bilateral screws extend into the iliac bone across the SI joint ?bilaterally. No hardware lucency or fracture. ?  ?IMPRESSION: ?Pedicle screw and interbody fusion L3-4. Subsidence of the spacer ?without bony fusion ?  ?Solid fusion L4-5 ?  ?Pedicle screw and interbody fusion L5-S1 with probable ?pseudarthrosis. ? ? ?Behavioral Observation: Sharon Logan  presents as a 66 y.o.-year-old Right Caucasian Female who appeared her stated age. her dress was Appropriate and she was Well Groomed and her manners were Appropriate to the situation.  her participation was indicative of Appropriate and Attentive behaviors.  There were physical disabilities noted.  she displayed an appropriate level of cooperation and motivation.   ? ? ?Interactions:    Active Appropriate ? ?Attention:   within normal limits and attention span and concentration were age appropriate ? ?Memory:   within normal limits; recent and remote memory intact ? ?Visuo-spatial:  not examined ? ?Speech (Volume):  normal ? ?Speech:   normal; normal ? ?Thought Process:  Coherent and Relevant ? ?Though Content:  WNL; not suicidal and not homicidal ? ?Orientation:   person, place, time/date, and situation ? ?Judgment:   Good ? ?Planning:   Good ? ?Affect:    Appropriate ? ?Mood:    Dysphoric ? ?Insight:   Good ? ?Intelligence:   high ? ?Marital Status/Living: The patient was born and raised in [place] along with 2 siblings.  She currently lives by herself.  The patient was married previously for 14 years and is now widowed.  The patient is a 63 year old daughter and a 3 year old son. ? ?Current Employment: The patient is retired. ? ?Past Employment:  The patient worked for the [employer] as a Scientist, clinical (histocompatibility and immunogenetics) for 34 years.  Hobbies and interest have included reading, walking and some gardening.  The patient was also in the [employer] serving in the [employer] between [place].  Highest rank was E4 and her rank at discharge was E4.   She had an honorable discharge.  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 ? ?Substance Use:  No concerns of substance abuse are reported.  Patient denies any substance use or abuse.  She denies any alcohol consumption or tobacco use. ? ?Education:   The patient completed her bachelor's degree in business achieving at ending GPA of 3.26.  She has attended [employer] in [place].  Her best subject was history and reports that she did have minor difficulties relative to her good academic performance in math. ? ?Medical History:   ?Past Medical History:  ?Diagnosis Date  ? Allergic rhinitis   ? Anxiety   ? Arthritis   ? Cancer [facility])   ? skin  ? Chronic pain   ? Chronically dry eyes   ? Depression   ? Fibromyalgia   ? GERD (gastroesophageal reflux disease)   ? Headache   ? Osteoporosis   ? Vitamin D deficiency   ? Wears glasses   ? ? ?     ?Patient Active Problem List  ? Diagnosis Date Noted  ? S/P  lumbar microdiscectomy 04/30/2021  ? S/P hardware removal 09/12/2020  ? Radiculopathy 06/29/2019  ? Cervical radiculopathy 08/15/2018  ? Chronic sinusitis 07/27/2018  ? Constipation 07/27/2018  ? Chronic bilateral low back pain with bilateral sciatica 07/01/2018  ? Disorder of bone and cartilage 04/04/2018  ? Esophageal reflux 04/04/2018  ? Other and unspecified hyperlipidemia 04/04/2018  ? Other malaise and fatigue 04/04/2018  ? Pain in joint involving ankle and foot 04/04/2018  ? Pain in soft tissues of limb 04/04/2018  ? Posttraumatic stress disorder 04/04/2018  ? Raynaud's phenomenon 04/04/2018  ? Skin sensation disturbance 04/04/2018  ? Sleep disturbance 04/04/2018  ? Vitamin D deficiency 04/04/2018  ? Myoclonus, segmental 10/18/2017  ? Adjacent segment disease with spinal stenosis 10/04/2017  ? DDD (degenerative disc disease), lumbar 10/30/2016  ? Spondylolisthesis of lumbar region 08/21/2016  ? Overweight with body mass index (BMI) 25.0-29.9 07/22/2016  ? Capsulitis of  metatarsophalangeal (MTP) joint of right foot 03/23/2016  ? Lumbar spinal stenosis 12/05/2015  ? Migraine 09/13/2012  ? Allergic rhinitis 09/13/2012  ? Neuropathy 09/13/2012  ? ?     ? ?     ?Abuse/Trauma

## 2021-09-15 ENCOUNTER — Encounter: Payer: Medicare HMO | Attending: Psychology | Admitting: Psychology

## 2021-09-15 DIAGNOSIS — M7918 Myalgia, other site: Secondary | ICD-10-CM | POA: Insufficient documentation

## 2021-09-15 DIAGNOSIS — M797 Fibromyalgia: Secondary | ICD-10-CM | POA: Diagnosis not present

## 2021-09-15 DIAGNOSIS — F331 Major depressive disorder, recurrent, moderate: Secondary | ICD-10-CM | POA: Diagnosis not present

## 2021-09-15 DIAGNOSIS — M5412 Radiculopathy, cervical region: Secondary | ICD-10-CM | POA: Diagnosis not present

## 2021-09-16 ENCOUNTER — Encounter: Payer: Self-pay | Admitting: Psychology

## 2021-09-16 DIAGNOSIS — M810 Age-related osteoporosis without current pathological fracture: Secondary | ICD-10-CM | POA: Diagnosis not present
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
## 2021-09-19 ENCOUNTER — Other Ambulatory Visit: Payer: Self-pay | Admitting: Psychiatry

## 2021-09-19 DIAGNOSIS — F331 Major depressive disorder, recurrent, moderate: Secondary | ICD-10-CM

## 2021-10-02 NOTE — Progress Notes (Signed)
? ?NEUROLOGY FOLLOW UP OFFICE NOTE ? ?Sharon Logan ?[id number] ? ?Assessment/Plan:  ? ?Migraine without aura, without status migrainosus.   ? ?  ?1.  Defer preventative. ?2.  Maxalt as needed.  Due to age, I would rather she not remain on a triptan.  She will find out from her insurance if any of the following may be an option:  Roselyn Meier, Nurtec, Reyvow ?3.  Limit use of pain relievers to no more than 2 days out of week to prevent risk of rebound or medication-overuse headache. ?4.  Keep headache diary ?5.  Follow up one year ?  ?Subjective:  ?Sharon Logan is a 66 year old right-handed female with fibromyalgia and depression who follows up for migraines and dizziness. ?  ?UPDATE: ?Last seen a year ago.  At that time, started Aimovig.  Stopped after 3 to 4 months because ineffective.  Did not let us know.  With Maxalt, headache resolves within 4 hours (within 2-2.5 hours if she can lay down and rest).  They occur 4 times a month.   ?Current NSAIDS/analgesics:  none ?Current triptans:  Maxalt '10mg'$  ?Current Antidepressant medications: Lexapro ?Current CGRP inhibitor:  none ?Other therapy:  none ?  ?Caffeine:  2 cups black tea daily.  Edna once a week.  No coffee. ?Diet:  100 oz water daily.  Does not skip meals ?Exercise:  yes ?Depression:  Generally controlled; but a little worse now. ?Other pain:  Fibromyalgia, back pain ?Sleep hygiene:  Gets a good 5 hours a night. ?  ?  ?HISTORY: ?She has had migraines "all my life" but worse since menopause.  Weather always is a trigger.  Headaches have been worse.  Typical headaches are across the forehead.  They have changed over the past 3 to 4 months.  They last 2-3 hours with Maxalt and occur about 15 days a month.  They are now bilateral retro-orbital aching pain.  There is associated with nausea, photophobia, phonophobia and in the past left greater than right arm numbness.  She now has associated dizziness, described as a spinning sensation in which she needs to hold  onto something, lasting a couple of minutes.  It was originally 2 times a day and then 1 to 2 times a week.  It is not positional but occurs with activity such as playing with her dog on the floor or exercising such as yoga.  Dizziness causes problems with balance.  No double vision.  Sometimes dizziness occurs independent from the headaches.  She tried vestibular rehab. ?  ?She has fibromyalgia and chronic low back pain and bilateral lumbar radiculopathy with known spondylosis and spinal stenosis of the lumbar spine s/p L4-5 fusion and later L3-S1 fusion revision.  She has previously been followed by surgery, pain management and neurology.  She has failed Cymbalta, gabapentin, Lyrica, Nucenta, baclofen, diclofenac, Celebrex, injections and surgery. ?  ?  ?  ?Past NSAIDS/analgesics:  Ibuprofen, naproxen, Excedrin, Fioricet, hydrocodone ?Past abortive triptans:  Relpax, sumatriptan ?Past abortive ergotamine:  none ?Past anti-emetic:  none  ?Past antihypertensive medications:  none ?Past antidepressant medications:  Nortriptyline, venlafaxine, mirtazapine, Lexapro ?Past anticonvulsant medications:  Topiramate, Depakote ?Past anti-CGRP:  none ?Other past therapies:  Botox (effective for 2 months and then wears off the last month) ?  ?  ?Family history of headache:  Mom ? ?PAST MEDICAL HISTORY: ?Past Medical History:  ?Diagnosis Date  ? Allergic rhinitis   ? Anxiety   ? Arthritis   ? Cancer (  [place])   ? skin  ? Chronic pain   ? Chronically dry eyes   ? Depression   ? Fibromyalgia   ? GERD (gastroesophageal reflux disease)   ? Headache   ? Osteoporosis   ? Vitamin D deficiency   ? Wears glasses   ? ? ?MEDICATIONS: ?Current Outpatient Medications on File Prior to Visit  ?Medication Sig Dispense Refill  ? acetaminophen (TYLENOL) 325 MG tablet Take 650 mg by mouth every 6 (six) hours as needed for moderate pain.    ? alendronate (FOSAMAX) 70 MG tablet Take 70 mg by mouth every Saturday.    ? b complex vitamins capsule Take 2  capsules by mouth daily.    ? Buprenorphine HCl (BELBUCA) 300 MCG FILM 1 film    ? Carboxymethylcellulose Sodium (THERATEARS OP) Apply 1 drop to eye 4 (four) times daily as needed (dry eyes).    ? cetirizine (ZYRTEC) 10 MG tablet Take 10 mg by mouth daily as needed for allergies.    ? cholecalciferol (VITAMIN D) 25 MCG (1000 UNIT) tablet Take 2,000 Units by mouth in the morning.    ? DRYSOL 20 % external solution Apply 1 application topically daily as needed (sweat).    ? DULoxetine (CYMBALTA) 30 MG capsule Take 30 mg by mouth 2 (two) times daily.    ? fluticasone (FLONASE) 50 MCG/ACT nasal spray Place 2 sprays into both nostrils 2 (two) times daily as needed for allergies or rhinitis.    ? magnesium 30 MG tablet Take 30 mg by mouth 2 (two) times daily.    ? nitroGLYCERIN (NITRO-DUR) 0.2 mg/hr patch Place 1 patch (0.2 mg total) onto the skin daily. (Patient taking differently: Place 0.2 mg onto the skin daily as needed (bunion flare).) 30 patch 2  ? rizatriptan (MAXALT) 10 MG tablet Take 10 mg by mouth daily as needed for migraine.     ? saccharomyces boulardii (FLORASTOR) 250 MG capsule Take 500 mg by mouth daily.    ? ?No current facility-administered medications on file prior to visit.  ? ? ?ALLERGIES: ?Allergies  ?Allergen Reactions  ? Gabapentin Other (See Comments)  ?  Delirium, stroke like symptoms   ? Amitriptyline Other (See Comments)  ? Polymyxin B Other (See Comments)  ?  Eyes go blood red ?  ? Zolpidem Rash  ?  "sleep walk" ?  ? Cymbalta [Duloxetine Hcl] Other (See Comments)  ?  Headaches, constipation  ? Fluoxetine Other (See Comments)  ?  CONSTIPATION ?  ? Lamotrigine Rash  ? Other Other (See Comments)  ?  OTOBIOTIC > RED EYES  ? Pregabalin Rash  ?  Itchy red rash on chest ?  ? ? ?FAMILY HISTORY: ?Family History  ?Problem Relation Age of Onset  ? Depression Mother   ? Heart attack Mother   ? COPD Mother   ? Depression Father   ? Heart attack Father   ? ADD / ADHD Son   ? ? ?  ?Objective:  ?Blood  pressure 127/64, pulse 62, height '5\' 4"'$  (1.626 m), weight 143 lb 6.4 oz (65 kg), SpO2 96 %. ?General: No acute distress.  Patient appears well-groomed.   ?Head:  Normocephalic/atraumatic ?Eyes:  Fundi examined but not visualized ?Heart:  Regular rate and rhythm ?Back: No paraspinal tenderness ?Neurological Exam: alert and oriented to person, place, and time.  Speech fluent and not dysarthric, language intact.  CN II-XII intact. Bulk and tone normal, muscle strength 5/5 throughout.  Sensation to light touch intact.  Deep tendon reflexes 2+ throughout, toes downgoing.  Finger to nose testing intact.  Antalgic gait (hip pain), Romberg negative. ? ? ?Metta Clines, DO ? ?CC: Faustino Congress, NP ? ? ? ? ? ? ?

## 2021-10-03 DIAGNOSIS — M961 Postlaminectomy syndrome, not elsewhere classified: Secondary | ICD-10-CM | POA: Diagnosis not present

## 2021-10-03 DIAGNOSIS — M5136 Other intervertebral disc degeneration, lumbar region: Secondary | ICD-10-CM | POA: Diagnosis not present

## 2021-10-06 ENCOUNTER — Encounter: Payer: Self-pay | Admitting: Neurology

## 2021-10-06 ENCOUNTER — Encounter (HOSPITAL_BASED_OUTPATIENT_CLINIC_OR_DEPARTMENT_OTHER): Payer: Medicare HMO | Admitting: Physical Medicine and Rehabilitation

## 2021-10-06 ENCOUNTER — Ambulatory Visit (INDEPENDENT_AMBULATORY_CARE_PROVIDER_SITE_OTHER): Payer: Medicare HMO | Admitting: Neurology

## 2021-10-06 ENCOUNTER — Encounter: Payer: Self-pay | Admitting: Physical Medicine and Rehabilitation

## 2021-10-06 VITALS — BP 112/71 | HR 56 | Temp 97.1°F | Ht 64.0 in | Wt 143.0 lb

## 2021-10-06 VITALS — BP 127/64 | HR 62 | Ht 64.0 in | Wt 143.4 lb

## 2021-10-06 DIAGNOSIS — F331 Major depressive disorder, recurrent, moderate: Secondary | ICD-10-CM | POA: Diagnosis not present

## 2021-10-06 DIAGNOSIS — G43009 Migraine without aura, not intractable, without status migrainosus: Secondary | ICD-10-CM | POA: Diagnosis not present

## 2021-10-06 DIAGNOSIS — M5412 Radiculopathy, cervical region: Secondary | ICD-10-CM | POA: Diagnosis not present

## 2021-10-06 DIAGNOSIS — M797 Fibromyalgia: Secondary | ICD-10-CM | POA: Diagnosis not present

## 2021-10-06 DIAGNOSIS — M7918 Myalgia, other site: Secondary | ICD-10-CM | POA: Diagnosis not present

## 2021-10-06 NOTE — Progress Notes (Signed)

## 2021-10-06 NOTE — Patient Instructions (Signed)
[email or web address] ? ?[employer] ?

## 2021-10-06 NOTE — Patient Instructions (Signed)
May continue the Maxalt but due to age, I would rather that you not be on a triptan.  Contact your insurance to see if any of the following are formulary and what their copay would be: ?Roselyn Meier ?Nurtec ?Reyvow ?Follow up one year ?

## 2021-10-12 ENCOUNTER — Telehealth: Payer: Self-pay

## 2021-10-12 ENCOUNTER — Other Ambulatory Visit: Payer: Self-pay

## 2021-10-12 ENCOUNTER — Telehealth (HOSPITAL_COMMUNITY): Payer: Self-pay | Admitting: Emergency Medicine

## 2021-10-12 ENCOUNTER — Encounter (HOSPITAL_COMMUNITY): Payer: Self-pay | Admitting: Emergency Medicine

## 2021-10-12 ENCOUNTER — Emergency Department (HOSPITAL_COMMUNITY): Payer: Medicare HMO

## 2021-10-12 ENCOUNTER — Emergency Department (HOSPITAL_COMMUNITY)
Admission: EM | Admit: 2021-10-12 | Discharge: 2021-10-12 | Disposition: A | Discharge status: Home / Self Care | Payer: Medicare HMO | Attending: Emergency Medicine | Admitting: Emergency Medicine

## 2021-10-12 DIAGNOSIS — M5442 Lumbago with sciatica, left side: Secondary | ICD-10-CM | POA: Insufficient documentation

## 2021-10-12 DIAGNOSIS — M545 Low back pain, unspecified: Secondary | ICD-10-CM | POA: Diagnosis not present

## 2021-10-12 DIAGNOSIS — Z85828 Personal history of other malignant neoplasm of skin: Secondary | ICD-10-CM | POA: Diagnosis not present

## 2021-10-12 DIAGNOSIS — M5441 Lumbago with sciatica, right side: Secondary | ICD-10-CM | POA: Insufficient documentation

## 2021-10-12 DIAGNOSIS — M8588 Other specified disorders of bone density and structure, other site: Secondary | ICD-10-CM | POA: Diagnosis not present

## 2021-10-12 IMAGING — CR DG LUMBAR SPINE COMPLETE 4+V
5 series · 5 of 5 positions shown · non-contrast
Comparison: Lumbar spine CT [DATE].

CLINICAL DATA: 67-year-old female with increased pain after
physical therapy. Radicular pain. Prior spine surgery.

EXAM:
LUMBAR SPINE - COMPLETE 4+ VIEW

[l-spine ap]
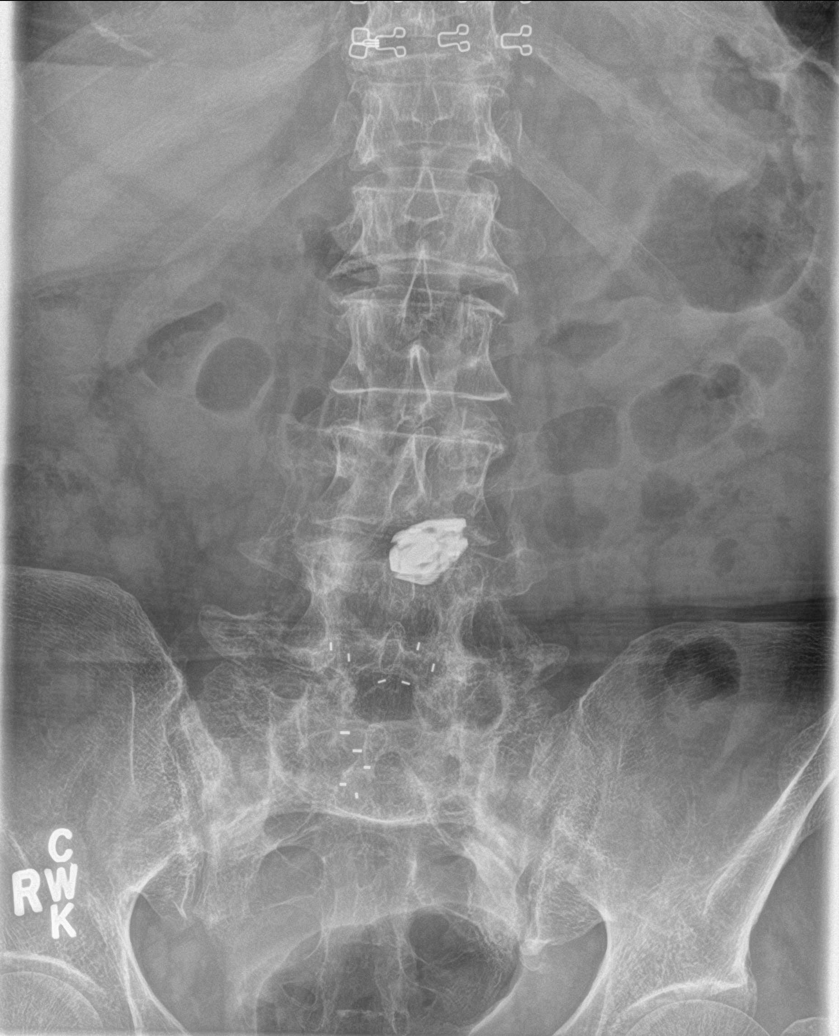

[l-spine obl (1 of 2)]
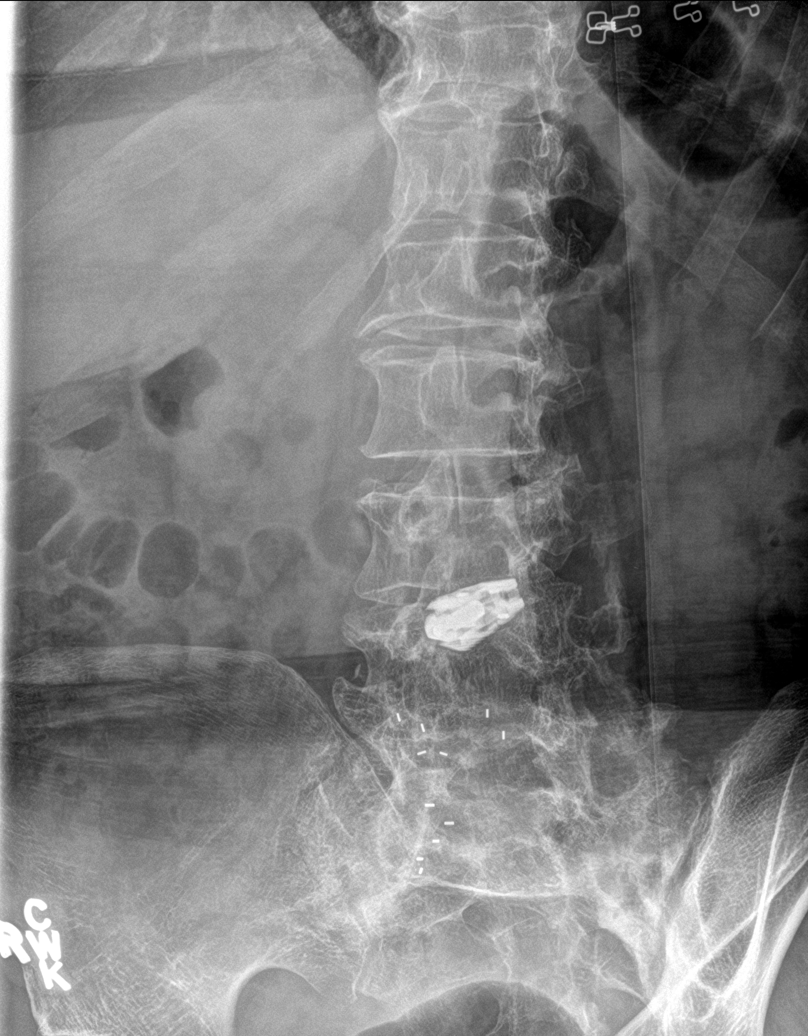

[l-spine obl (2 of 2)]
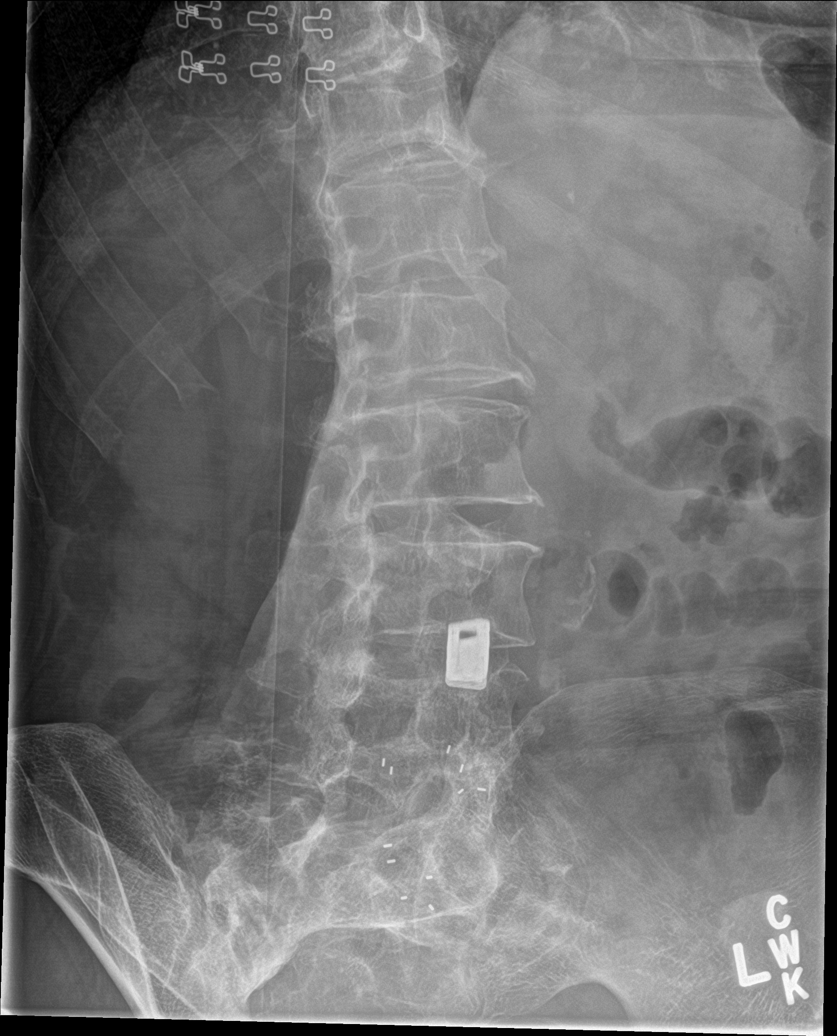

[l-spine lat]
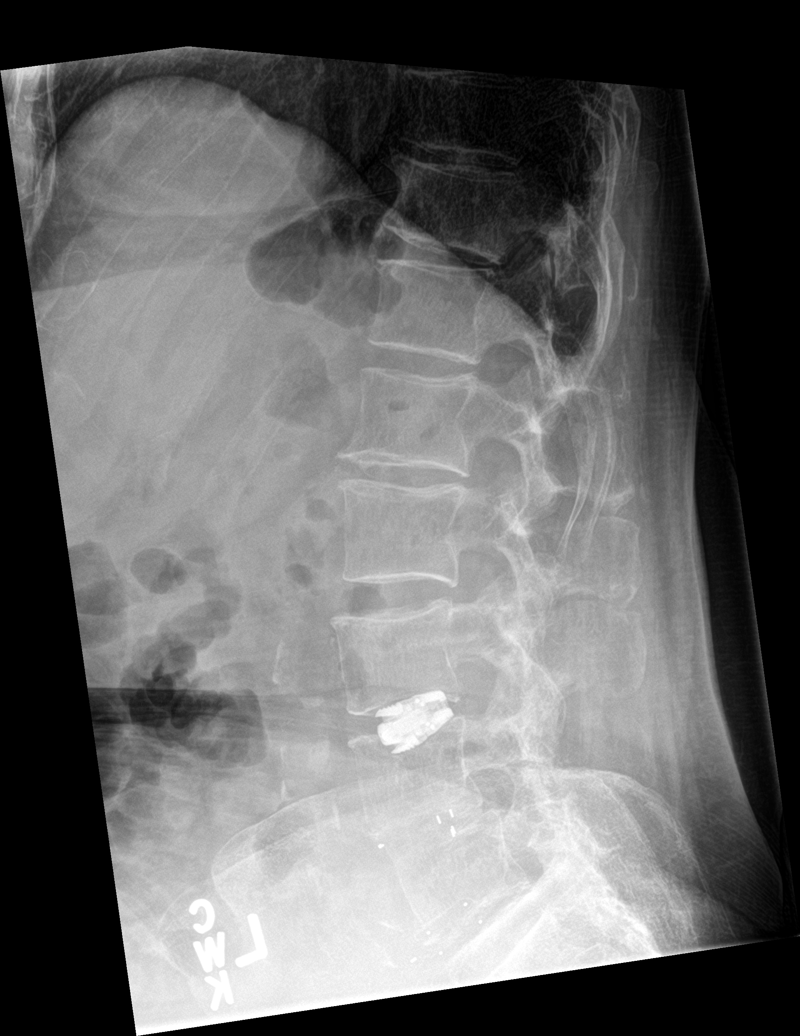

[l-spine spot]
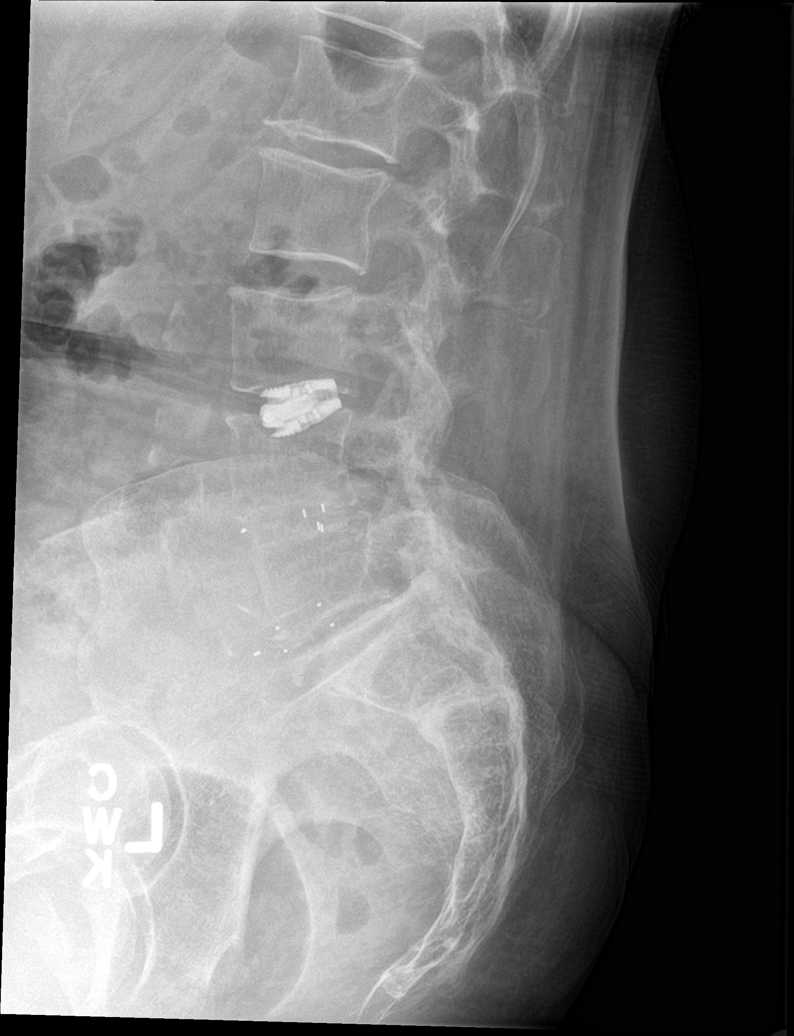

[5 of 5 positions shown; findings below may reference images not displayed]

FINDINGS: Osteopenia. Normal lumbar segmentation. Previous interbody fusion
L3-L4 through L5-S1 but removal of the bilateral transpedicular
hardware seen in [MS]. Mild endplate subsidence at L3-L4 appears
stable. Chronic S2 and sacral ala fractures. No new osseous
abnormality identified. Calcified aortic atherosclerosis. Negative
visible lower chest and abdominal visceral contours.
IMPRESSION: 1. Prior L3-L4 through L5-S1 fusion with removed bilateral pedicle
screws since [DATE]. No acute osseous abnormality identified. Chronic sacral fracture.
3.  Aortic Atherosclerosis ([MS]-[MS]).

## 2021-10-12 MED ORDER — LIDOCAINE 5 % EX PTCH
1.0000 | MEDICATED_PATCH | CUTANEOUS | Status: DC
  Administered 2021-10-12: 1 via TRANSDERMAL
  Filled 2021-10-12: qty 1

## 2021-10-12 MED ORDER — CYCLOBENZAPRINE HCL 10 MG PO TABS: 5.0000 mg | ORAL_TABLET | Freq: Once | ORAL | Status: DC

## 2021-10-12 MED ORDER — CYCLOBENZAPRINE HCL 10 MG PO TABS: 10.0000 mg | ORAL_TABLET | Freq: Two times a day (BID) | ORAL | 0 refills | Status: DC | PRN

## 2021-10-12 NOTE — Discharge Instructions (Addendum)
Hold your baclofen and try Flexeril as prescribed.  Follow-up with your doctor for medication changes and adjustments ?

## 2021-10-12 NOTE — ED Triage Notes (Signed)
C/o lower back pain that radiates to bilateral legs since doing leg presses at PT 2 weeks ago.  States she stopped going to PT due to pain. ?

## 2021-10-12 NOTE — ED Provider Notes (Signed)
?[facility] ?Provider Note ? ? ?CSN: [id number] ?Arrival date & time: 10/12/21  0757 ? ?  ? ?History ? ?Chief Complaint  ?Patient presents with  ? Back Pain  ? Leg Pain  ? ? ?Sharon Logan is a 66 y.o. female. ? ?66 year old female with past medical history of skin cancer, fibromyalgia, osteoporosis, prior L3-SI fusion with revision, presents with complaint of pain in the low back which radiates down both legs, onset while doing leg presses in PT 2 weeks ago. Denies groin numbness, leg weakness/difficulty walking. Seen by pain management on 10/03/21, medications adjusted, but not helping. Seen on 10/06/21 by physical medicine and rehabilitation, cervical pain addressed, not lumbar.  ? ? ?  ? ?Home Medications ?Prior to Admission medications   ?Medication Sig Start Date End Date Taking? Authorizing Provider  ?acetaminophen (TYLENOL) 325 MG tablet Take 650 mg by mouth every 6 (six) hours as needed for moderate pain.    [provider]  ?Buprenorphine HCl 450 MCG FILM Place inside cheek. 10/03/21 12/02/21  [provider]  ?Carboxymethylcellulose Sodium (THERATEARS OP) Apply 1 drop to eye 4 (four) times daily as needed (dry eyes).    [provider]  ?cetirizine (ZYRTEC) 10 MG tablet Take 10 mg by mouth daily as needed for allergies.    [provider]  ?cholecalciferol (VITAMIN D) 25 MCG (1000 UNIT) tablet Take 2,000 Units by mouth in the morning.    [provider]  ?diclofenac Sodium (VOLTAREN) 1 % GEL Apply topically. 09/22/21   [provider]  ?DRYSOL 20 % external solution Apply 1 application topically daily as needed (sweat). 11/15/20   [provider]  ?EPINEPHrine 0.3 mg/0.3 mL IJ SOAJ injection see administration instructions. 05/26/21   [provider]  ?escitalopram (LEXAPRO) 20 MG tablet Take 20 mg by mouth daily.    [provider]  ?fluticasone (FLONASE) 50 MCG/ACT nasal spray Place 2 sprays  into both nostrils 2 (two) times daily as needed for allergies or rhinitis.    [provider]  ?lidocaine (LIDODERM) 5 % 1 patch daily. 04/19/21   [provider]  ?magnesium 30 MG tablet Take 30 mg by mouth 2 (two) times daily.    [provider]  ?nitroGLYCERIN (NITRO-DUR) 0.2 mg/hr patch Place 1 patch (0.2 mg total) onto the skin daily. ?Patient not taking: Reported on 10/06/2021 11/26/20   Trula Slade, DPM  ?rizatriptan (MAXALT) 10 MG tablet Take 10 mg by mouth daily as needed for migraine.  09/08/18   [provider]  ?saccharomyces boulardii (FLORASTOR) 250 MG capsule Take 500 mg by mouth daily.    [provider]  ?sulindac (CLINORIL) 150 MG tablet Take by mouth. 08/22/21   [provider]  ?   ? ?Allergies    ?Gabapentin, Amitriptyline, Polymyxin b, Zolpidem, Cymbalta [duloxetine hcl], Fluoxetine, Lamotrigine, Other, and Pregabalin   ? ?Review of Systems   ?Review of Systems ?Negative except as per HPI ?Physical Exam ?Updated Vital Signs ?BP 123/77 (BP Location: Right Arm)   Pulse 65   Temp 97.7 ?F (36.5 ?C) (Oral)   Resp 18   SpO2 98%  ?Physical Exam ?Vitals and nursing note reviewed.  ?Constitutional:   ?   General: She is not in acute distress. ?   Appearance: She is well-developed. She is not diaphoretic.  ?HENT:  ?   Head: Normocephalic and atraumatic.  ?Pulmonary:  ?   Effort: Pulmonary effort is normal.  ?Musculoskeletal:     ?  General: No swelling or tenderness.  ?   Lumbar back: No tenderness or bony tenderness. Negative right straight leg raise test and negative left straight leg raise test.  ?   Right lower leg: No edema.  ?   Left lower leg: No edema.  ?Skin: ?   General: Skin is warm and dry.  ?Neurological:  ?   Mental Status: She is alert and oriented to person, place, and time.  ?   Sensory: No sensory deficit.  ?   Motor: No weakness.  ?   Deep Tendon Reflexes: Babinski sign absent on the right side. Babinski sign absent on the  left side.  ?   Reflex Scores: ?     Patellar reflexes are 1+ on the right side and 1+ on the left side. ?     Achilles reflexes are 1+ on the right side and 1+ on the left side. ?Psychiatric:     ?   Behavior: Behavior normal.  ? ? ?ED Results / Procedures / Treatments   ?Labs ?(all labs ordered are listed, but only abnormal results are displayed) ?Labs Reviewed - No data to display ? ?EKG ?None ? ?Radiology ?DG Lumbar Spine Complete ? ?Result Date: 10/12/2021 ?CLINICAL DATA:  66 year old female with increased pain after physical therapy. Radicular pain. Prior spine surgery. EXAM: LUMBAR SPINE - COMPLETE 4+ VIEW COMPARISON:  Lumbar spine CT 03/29/2021. FINDINGS: Osteopenia. Normal lumbar segmentation. Previous interbody fusion L3-L4 through L5-S1 but removal of the bilateral transpedicular hardware seen in 2022. Mild endplate subsidence at G6-Y6 appears stable. Chronic S2 and sacral ala fractures. No new osseous abnormality identified. Calcified aortic atherosclerosis. Negative visible lower chest and abdominal visceral contours. IMPRESSION: 1. Prior L3-L4 through L5-S1 fusion with removed bilateral pedicle screws since October. 2. No acute osseous abnormality identified. Chronic sacral fracture. 3.  Aortic Atherosclerosis (ICD10-I70.0). Electronically Signed   By: Genevie Ann M.D.   On: 10/12/2021 09:42   ? ?Procedures ?Procedures  ? ? ?Medications Ordered in ED ?Medications  ?lidocaine (LIDODERM) 5 % 1 patch (1 patch Transdermal Patch Applied 10/12/21 0920)  ?cyclobenzaprine (FLEXERIL) tablet 5 mg (has no administration in time range)  ? ? ?ED Course/ Medical Decision Making/ A&P ?Clinical Course as of 10/12/21 1013  ?Sun Oct 12, 2021  ?[id number] On selundec, balbuca, lexapro  [LM]  ?  ?Clinical Course User Index ?[LM] Tacy Learn, PA-C  ? ?                        ?Medical Decision Making ?Amount and/or Complexity of Data Reviewed ?Radiology: ordered. ? ?Risk ?Prescription drug management. ? ? ?66 year old female with  complaint of acute on chronic low back pain radiating down both legs after doing PT 2 weeks ago.  Taking her medications as prescribed by her pain management provider without improvement.  Imaging not obtained at her most recent visit to pain management, obtained x-ray lumbar spine which is negative for acute findings.  Patient was given Lidoderm patch while in the ER, request changed from baclofen to Flexeril.  Prescription sent to her pharmacy, advised to recheck with her prescriber for further medication adjustments. ? ? ? ? ? ? ? ?Final Clinical Impression(s) / ED Diagnoses ?Final diagnoses:  ?Acute bilateral low back pain with bilateral sciatica  ? ? ?Rx / DC Orders ?ED Discharge Orders   ? ? None  ? ?  ? ? ?  ?Tacy Learn, PA-C ?10/12/21 1013 ? ?  ?  Lucrezia Starch, MD ?10/12/21 1442 ? ?

## 2021-10-12 NOTE — Telephone Encounter (Signed)
Patient states the medication the provider said she would send did not get sent to pharmacy. On chart, no medications sent. Messaged Provider Mickel Baas PA to send to [facility] [address] per patients request. ?

## 2021-10-14 DIAGNOSIS — M461 Sacroiliitis, not elsewhere classified: Secondary | ICD-10-CM | POA: Diagnosis not present

## 2021-10-14 DIAGNOSIS — G8929 Other chronic pain: Secondary | ICD-10-CM | POA: Diagnosis not present

## 2021-10-14 DIAGNOSIS — M48061 Spinal stenosis, lumbar region without neurogenic claudication: Secondary | ICD-10-CM | POA: Diagnosis not present

## 2021-10-14 DIAGNOSIS — M545 Low back pain, unspecified: Secondary | ICD-10-CM | POA: Diagnosis not present

## 2021-10-14 DIAGNOSIS — S3210XD Unspecified fracture of sacrum, subsequent encounter for fracture with routine healing: Secondary | ICD-10-CM | POA: Diagnosis not present

## 2021-10-14 DIAGNOSIS — M797 Fibromyalgia: Secondary | ICD-10-CM | POA: Diagnosis not present

## 2021-10-14 DIAGNOSIS — M8580 Other specified disorders of bone density and structure, unspecified site: Secondary | ICD-10-CM | POA: Diagnosis not present

## 2021-10-15 ENCOUNTER — Telehealth: Payer: Self-pay | Admitting: Neurology

## 2021-10-15 NOTE — Telephone Encounter (Signed)
patients insurance [employer] said our office needs to call them and let them know what type of RX we are trying to send in for Nurtec. [phone] for approval ?

## 2021-10-16 ENCOUNTER — Other Ambulatory Visit (HOSPITAL_COMMUNITY): Payer: Self-pay

## 2021-10-16 ENCOUNTER — Telehealth: Payer: Self-pay | Admitting: Pharmacy Technician

## 2021-10-16 NOTE — Telephone Encounter (Signed)
Patient Advocate Encounter ?  ?Received notification that prior authorization for Nurtec '75MG'$  dispersible tablets is required. ?  ?PA submitted on 10/16/2021 ?Key  [id number] ?Status is pending ?   ? ? ?Lady Deutscher, CPhT-Adv ?Pharmacy Patient Advocate Specialist ?[place] Patient Advocate Team ?Direct Number: [phone]  Fax: [phone] ? ?

## 2021-10-17 DIAGNOSIS — H6983 Other specified disorders of Eustachian tube, bilateral: Secondary | ICD-10-CM | POA: Diagnosis not present

## 2021-10-17 DIAGNOSIS — J029 Acute pharyngitis, unspecified: Secondary | ICD-10-CM | POA: Diagnosis not present

## 2021-10-17 DIAGNOSIS — J329 Chronic sinusitis, unspecified: Secondary | ICD-10-CM | POA: Diagnosis not present

## 2021-10-17 NOTE — Telephone Encounter (Signed)
Patient Advocate Encounter ? ?Prior Authorization for Nurtec '75MG'$  dispersible tablets has been approved.   ? ?PA# [id number] ?Effective dates: 10/17/2021 through 06/14/2022 ? ? ? ? ? ?Lyndel Safe, CPhT ?Pharmacy Patient Advocate Specialist ?[facility] Patient Advocate Team ?Direct Number: [phone]  Fax: [phone]  ?

## 2021-10-30 DIAGNOSIS — J320 Chronic maxillary sinusitis: Secondary | ICD-10-CM | POA: Diagnosis not present

## 2021-10-30 DIAGNOSIS — M5136 Other intervertebral disc degeneration, lumbar region: Secondary | ICD-10-CM | POA: Diagnosis not present

## 2021-10-30 DIAGNOSIS — H6981 Other specified disorders of Eustachian tube, right ear: Secondary | ICD-10-CM | POA: Diagnosis not present

## 2021-10-30 DIAGNOSIS — M48061 Spinal stenosis, lumbar region without neurogenic claudication: Secondary | ICD-10-CM | POA: Diagnosis not present

## 2021-10-30 DIAGNOSIS — M961 Postlaminectomy syndrome, not elsewhere classified: Secondary | ICD-10-CM | POA: Diagnosis not present

## 2021-10-30 DIAGNOSIS — J3089 Other allergic rhinitis: Secondary | ICD-10-CM | POA: Diagnosis not present

## 2021-10-30 DIAGNOSIS — F3341 Major depressive disorder, recurrent, in partial remission: Secondary | ICD-10-CM | POA: Diagnosis not present

## 2021-11-04 DIAGNOSIS — M546 Pain in thoracic spine: Secondary | ICD-10-CM | POA: Diagnosis not present

## 2021-11-04 DIAGNOSIS — M9902 Segmental and somatic dysfunction of thoracic region: Secondary | ICD-10-CM | POA: Diagnosis not present

## 2021-11-04 DIAGNOSIS — M9901 Segmental and somatic dysfunction of cervical region: Secondary | ICD-10-CM | POA: Diagnosis not present

## 2021-11-04 DIAGNOSIS — M533 Sacrococcygeal disorders, not elsewhere classified: Secondary | ICD-10-CM | POA: Diagnosis not present

## 2021-11-04 DIAGNOSIS — M25551 Pain in right hip: Secondary | ICD-10-CM | POA: Diagnosis not present

## 2021-11-04 DIAGNOSIS — M62838 Other muscle spasm: Secondary | ICD-10-CM | POA: Diagnosis not present

## 2021-11-04 DIAGNOSIS — M461 Sacroiliitis, not elsewhere classified: Secondary | ICD-10-CM | POA: Diagnosis not present

## 2021-11-04 DIAGNOSIS — M961 Postlaminectomy syndrome, not elsewhere classified: Secondary | ICD-10-CM | POA: Diagnosis not present

## 2021-11-04 DIAGNOSIS — M9903 Segmental and somatic dysfunction of lumbar region: Secondary | ICD-10-CM | POA: Diagnosis not present

## 2021-11-04 DIAGNOSIS — M542 Cervicalgia: Secondary | ICD-10-CM | POA: Diagnosis not present

## 2021-11-04 DIAGNOSIS — M62830 Muscle spasm of back: Secondary | ICD-10-CM | POA: Diagnosis not present

## 2021-11-04 DIAGNOSIS — M545 Low back pain, unspecified: Secondary | ICD-10-CM | POA: Diagnosis not present

## 2021-11-05 ENCOUNTER — Encounter: Payer: Medicare HMO | Attending: Psychology | Admitting: Psychology

## 2021-11-05 DIAGNOSIS — M7912 Myalgia of auxiliary muscles, head and neck: Secondary | ICD-10-CM

## 2021-11-05 DIAGNOSIS — M5412 Radiculopathy, cervical region: Secondary | ICD-10-CM | POA: Diagnosis not present

## 2021-11-05 DIAGNOSIS — M797 Fibromyalgia: Secondary | ICD-10-CM | POA: Diagnosis not present

## 2021-11-05 DIAGNOSIS — F331 Major depressive disorder, recurrent, moderate: Secondary | ICD-10-CM | POA: Diagnosis not present

## 2021-11-05 DIAGNOSIS — M7918 Myalgia, other site: Secondary | ICD-10-CM

## 2021-11-06 ENCOUNTER — Encounter: Payer: Self-pay | Admitting: Psychology

## 2021-11-06 NOTE — Progress Notes (Signed)
Neuropsychological Consultation   Patient:   Sharon Logan   DOB:   1956/06/05  MR Number:  [id number]  Location:  [facility] [place], [place] [id number] [facility] [place] [address] Dept: [phone]           Date of Service:   11/05/2021  Start Time:   2 PM End Time:   3 PM  Today's visit was an in person visit was conducted in my outpatient clinic office with the patient myself present.  Provider/Observer:  Ilean Skill, Psy.D.       Clinical Neuropsychologist       Billing Code/Service: 96158/96159   Reason for Service:  Sharon Logan is a 66 year old female referred by Leeroy Cha, MD in conjunction with her PCP Marda Stalker, PA-C for neuropsychological consultation and therapeutic interventions due to residual and ongoing chronic pain issues in the setting of fibromyalgia and prior lumbar back surgery with continued chronic pain.  The patient is also been coping with cervical myofascial pain syndrome and intolerance to cold.  The patient describes significant fatigue, difficulty walking and moving and has described her muscles as feeling like "dead weight" in both her arms and legs.  The patient reports that the symptoms have been going on for at least 4 years but has become more severe since August of this year.  2 years ago she had back surgery with fusion at L4-L5 and L5-S1.  The patient has recently been tried on oxycodone for pain and has been using it for 2 months to see if it was helpful.  She is taking this twice per day.  The patient reports that she was diagnosed with fibromyalgia in 2010 and has had periodic flareups particular around times of cold and that these flareups come and go.  Current medications include baclofen, oxycodone and Wellbutrin.  Patient has tried TENS units before and there have been discussions about possible spinal cord stimulator trials  but this has not been pursued at this point.  Patient with significant depressive response along with her chronic pain and cervical myofascial pain.  She has just begun using trigger point injections with her physiatrist.  The patient reports that she has had multiple back surgeries in 2018, 2019 and 2020.  Her current symptoms have become exacerbated and worsened in August of this year and reports that she is now in constant pain and she has been talking and addressing this issue with her orthopedist about the possibility of removing the hardware that was placed previously.  The patient describes her pain as sharp, burning, stabbing, tingling and aching.  The patient does have a past medical history including depression and anxiety type symptoms along with her cervical and lumbar region difficulties.  The patient has been identified with vitamin D deficiency, neuropathy, radiculopathy, posttraumatic stress disorder.  We did not get into the elements of her PTSD during this visit.  The patient reports that she has significant difficulty falling asleep and will take up to 2 hours to fall asleep and only gets about 4 to 5 hours of sleep each night.  The patient reports that her appetite is good and that her memory and cognition are good.  Most recent imaging was a CT scan of her back done in February 2022 the impression/interpretation of these findings showed Disc levels: L1-2: Mild disc degeneration.  Negative for stenosis   L2-3: Mild retrolisthesis. Disc bulging and mild  facet degeneration. Negative for stenosis.   L3-4: Pedicle screw and interbody fusion. Metal interbody spacer with subsidence into the endplates at N5-6. No solid bony fusion present. Pedicle screws in satisfactory position   L4-5: Pedicle screw and interbody fusion. Solid interbody fusion. Hardware in satisfactory position   L5-S1: Pedicle screw and interbody fusion. Persistent gas in the disc space. Probable pseudarthrosis at  this level.   Bilateral screws extend into the iliac bone across the SI joint bilaterally. No hardware lucency or fracture.   IMPRESSION: Pedicle screw and interbody fusion L3-4. Subsidence of the spacer without bony fusion   Solid fusion L4-5   Pedicle screw and interbody fusion L5-S1 with probable pseudarthrosis.   Behavioral Observation: Sharon Logan  presents as a 66 y.o.-year-old Right Caucasian Female who appeared her stated age. her dress was Appropriate and she was Well Groomed and her manners were Appropriate to the situation.  her participation was indicative of Appropriate and Attentive behaviors.  There were physical disabilities noted.  she displayed an appropriate level of cooperation and motivation.     Interactions:    Active Appropriate  Attention:   within normal limits and attention span and concentration were age appropriate  Memory:   within normal limits; recent and remote memory intact  Visuo-spatial:  not examined  Speech (Volume):  normal  Speech:   normal; normal  Thought Process:  Coherent and Relevant  Though Content:  WNL; not suicidal and not homicidal  Orientation:   person, place, time/date, and situation  Judgment:   Good  Planning:   Good  Affect:    Appropriate  Mood:    Dysphoric  Insight:   Good  Intelligence:   high  Marital Status/Living: The patient was born and raised in [place] along with 2 siblings.  She currently lives by herself.  The patient was married previously for 14 years and is now widowed.  The patient is a 14 year old daughter and a 59 year old son.  Current Employment: The patient is retired.  Past Employment:  The patient worked for the [employer] as a Scientist, clinical (histocompatibility and immunogenetics) for 34 years.  Hobbies and interest have included reading, walking and some gardening.  The patient was also in the [employer] serving in the [employer] between [place].  Highest rank was E4 and her rank at discharge was E4.   She had an honorable discharge.  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  Substance Use:  No concerns of substance abuse are reported.  Patient denies any substance use or abuse.  She denies any alcohol consumption or tobacco use.  Education:   The patient completed her bachelor's degree in business achieving at ending GPA of 3.26.  She has attended [employer] in [place].  Her best subject was history and reports that she did have minor difficulties relative to her good academic performance in math.  Medical History:   Past Medical History:  Diagnosis Date   Allergic rhinitis    Anxiety    Arthritis    Cancer (HCC)    skin   Chronic pain    Chronically dry eyes    Depression    Fibromyalgia    GERD (gastroesophageal reflux disease)    Headache    Osteoporosis    Vitamin D deficiency    Wears glasses          Patient Active Problem List   Diagnosis Date Noted   S/P  lumbar microdiscectomy 04/30/2021   S/P hardware removal 09/12/2020   Radiculopathy 06/29/2019   Cervical radiculopathy 08/15/2018   Chronic sinusitis 07/27/2018   Constipation 07/27/2018   Chronic bilateral low back pain with bilateral sciatica 07/01/2018   Disorder of bone and cartilage 04/04/2018   Esophageal reflux 04/04/2018   Other and unspecified hyperlipidemia 04/04/2018   Other malaise and fatigue 04/04/2018   Pain in joint involving ankle and foot 04/04/2018   Pain in soft tissues of limb 04/04/2018   Posttraumatic stress disorder 04/04/2018   Raynaud's phenomenon 04/04/2018   Skin sensation disturbance 04/04/2018   Sleep disturbance 04/04/2018   Vitamin D deficiency 04/04/2018   Myoclonus, segmental 10/18/2017   Adjacent segment disease with spinal stenosis 10/04/2017   DDD (degenerative disc disease), lumbar 10/30/2016   Spondylolisthesis of lumbar region 08/21/2016   Overweight with body mass index (BMI) 25.0-29.9 07/22/2016   Capsulitis of  metatarsophalangeal (MTP) joint of right foot 03/23/2016   Lumbar spinal stenosis 12/05/2015   Migraine 09/13/2012   Allergic rhinitis 09/13/2012   Neuropathy 09/13/2012              Abuse/Trauma History: Patient does have a history of previous diagnosis of posttraumatic stress disorder but we have not reviewed this yet.  Psychiatric History:  Patient has a past psychiatric history including depression and anxiety as well as PTSD.  Family Med/Psych History:  Family History  Problem Relation Age of Onset   Depression Mother    Heart attack Mother    COPD Mother    Depression Father    Heart attack Father    ADD / ADHD Son     Risk of Suicide/Violence: low patient denies any suicidal or homicidal ideation.  Impression/DX:  Sharon Logan is a 66 year old female referred by Leeroy Cha, MD in conjunction with her PCP Marda Stalker, PA-C for neuropsychological consultation and therapeutic interventions due to residual and ongoing chronic pain issues in the setting of fibromyalgia and prior lumbar back surgery with continued chronic pain.  The patient is also been coping with cervical myofascial pain syndrome and intolerance to cold.  The patient describes significant fatigue, difficulty walking and moving and has described her muscles as feeling like "dead weight" in both her arms and legs.  The patient reports that the symptoms have been going on for at least 4 years but has become more severe since August of this year.  2 years ago she had back surgery with fusion at L4-L5 and L5-S1.  The patient has recently been tried on oxycodone for pain and has been using it for 2 months to see if it was helpful.  She is taking this twice per day.  The patient reports that she was diagnosed with fibromyalgia in 2010 and has had periodic flareups particular around times of cold and that these flareups come and go.  Current medications include baclofen, oxycodone and Wellbutrin.  Patient has tried  TENS units before and there have been discussions about possible spinal cord stimulator trials but this has not been pursued at this point.  Patient with significant depressive response along with her chronic pain and cervical myofascial pain.  She has just begun using trigger point injections with her physiatrist.  Disposition/Plan:  11/05/2021: Today we continue to work on therapeutic interventions with the patient around chronic and significant pain and addressed issues related to factors that could modify both the intensity, duration and frequency of significant pain events although she has a constant baseline level of  pain.  Addressed issues related to cognitive coping skills, sleep hygiene issues as well as discussing potential therapeutic interventions that might be available.  The patient continues to have difficulty with sleep and I will address potential options with her attending physician to see what the physicians thoughts are on trying something such as a very low-dose Seroquel at night.  Diagnosis:    Fibromyalgia  Moderate episode of recurrent major depressive disorder (HCC)  Cervical myofascial pain syndrome  Cervical radiculopathy         Electronically Signed   _______________________ Ilean Skill, Psy.D. Clinical Neuropsychologist

## 2021-11-18 ENCOUNTER — Ambulatory Visit: Admitting: Podiatry

## 2021-11-19 DIAGNOSIS — M5416 Radiculopathy, lumbar region: Secondary | ICD-10-CM | POA: Diagnosis not present

## 2021-11-19 DIAGNOSIS — M545 Low back pain, unspecified: Secondary | ICD-10-CM | POA: Diagnosis not present

## 2021-11-24 DIAGNOSIS — M9902 Segmental and somatic dysfunction of thoracic region: Secondary | ICD-10-CM | POA: Diagnosis not present

## 2021-11-24 DIAGNOSIS — M62830 Muscle spasm of back: Secondary | ICD-10-CM | POA: Diagnosis not present

## 2021-11-24 DIAGNOSIS — M62838 Other muscle spasm: Secondary | ICD-10-CM | POA: Diagnosis not present

## 2021-11-24 DIAGNOSIS — M542 Cervicalgia: Secondary | ICD-10-CM | POA: Diagnosis not present

## 2021-11-24 DIAGNOSIS — M9901 Segmental and somatic dysfunction of cervical region: Secondary | ICD-10-CM | POA: Diagnosis not present

## 2021-11-24 DIAGNOSIS — M546 Pain in thoracic spine: Secondary | ICD-10-CM | POA: Diagnosis not present

## 2021-11-24 DIAGNOSIS — M9903 Segmental and somatic dysfunction of lumbar region: Secondary | ICD-10-CM | POA: Diagnosis not present

## 2021-11-24 DIAGNOSIS — M545 Low back pain, unspecified: Secondary | ICD-10-CM | POA: Diagnosis not present

## 2021-11-26 DIAGNOSIS — T849XXA Unspecified complication of internal orthopedic prosthetic device, implant and graft, initial encounter: Secondary | ICD-10-CM | POA: Diagnosis not present

## 2021-11-26 DIAGNOSIS — M2041 Other hammer toe(s) (acquired), right foot: Secondary | ICD-10-CM | POA: Diagnosis not present

## 2021-11-26 DIAGNOSIS — M79671 Pain in right foot: Secondary | ICD-10-CM | POA: Diagnosis not present

## 2021-11-26 DIAGNOSIS — M25376 Other instability, unspecified foot: Secondary | ICD-10-CM | POA: Diagnosis not present

## 2021-11-28 DIAGNOSIS — M961 Postlaminectomy syndrome, not elsewhere classified: Secondary | ICD-10-CM | POA: Diagnosis not present

## 2021-11-28 DIAGNOSIS — M5136 Other intervertebral disc degeneration, lumbar region: Secondary | ICD-10-CM | POA: Diagnosis not present

## 2021-11-28 DIAGNOSIS — M25551 Pain in right hip: Secondary | ICD-10-CM | POA: Diagnosis not present

## 2021-11-28 DIAGNOSIS — Z79899 Other long term (current) drug therapy: Secondary | ICD-10-CM | POA: Diagnosis not present

## 2021-11-28 DIAGNOSIS — Z5181 Encounter for therapeutic drug level monitoring: Secondary | ICD-10-CM | POA: Diagnosis not present

## 2021-11-28 DIAGNOSIS — M533 Sacrococcygeal disorders, not elsewhere classified: Secondary | ICD-10-CM | POA: Diagnosis not present

## 2021-12-05 DIAGNOSIS — R5383 Other fatigue: Secondary | ICD-10-CM | POA: Diagnosis not present

## 2021-12-05 DIAGNOSIS — I7300 Raynaud's syndrome without gangrene: Secondary | ICD-10-CM | POA: Diagnosis not present

## 2021-12-05 DIAGNOSIS — R6889 Other general symptoms and signs: Secondary | ICD-10-CM | POA: Diagnosis not present

## 2021-12-10 DIAGNOSIS — Z0000 Encounter for general adult medical examination without abnormal findings: Secondary | ICD-10-CM | POA: Diagnosis not present

## 2021-12-10 DIAGNOSIS — D84821 Immunodeficiency due to drugs: Secondary | ICD-10-CM | POA: Diagnosis not present

## 2021-12-10 DIAGNOSIS — I7300 Raynaud's syndrome without gangrene: Secondary | ICD-10-CM | POA: Diagnosis not present

## 2021-12-10 DIAGNOSIS — R252 Cramp and spasm: Secondary | ICD-10-CM | POA: Diagnosis not present

## 2021-12-10 DIAGNOSIS — Z79899 Other long term (current) drug therapy: Secondary | ICD-10-CM | POA: Diagnosis not present

## 2021-12-10 DIAGNOSIS — M810 Age-related osteoporosis without current pathological fracture: Secondary | ICD-10-CM | POA: Diagnosis not present

## 2021-12-12 DIAGNOSIS — F329 Major depressive disorder, single episode, unspecified: Secondary | ICD-10-CM | POA: Diagnosis not present

## 2021-12-12 DIAGNOSIS — F4310 Post-traumatic stress disorder, unspecified: Secondary | ICD-10-CM | POA: Diagnosis not present

## 2021-12-12 DIAGNOSIS — M79671 Pain in right foot: Secondary | ICD-10-CM | POA: Diagnosis not present

## 2021-12-12 DIAGNOSIS — S93124A Dislocation of metatarsophalangeal joint of right lesser toe(s), initial encounter: Secondary | ICD-10-CM | POA: Diagnosis not present

## 2021-12-12 DIAGNOSIS — Z79899 Other long term (current) drug therapy: Secondary | ICD-10-CM | POA: Diagnosis not present

## 2021-12-12 DIAGNOSIS — T84418A Breakdown (mechanical) of other internal orthopedic devices, implants and grafts, initial encounter: Secondary | ICD-10-CM | POA: Diagnosis not present

## 2021-12-12 DIAGNOSIS — S93129A Dislocation of metatarsophalangeal joint of unspecified toe(s), initial encounter: Secondary | ICD-10-CM | POA: Diagnosis not present

## 2021-12-12 DIAGNOSIS — Z888 Allergy status to other drugs, medicaments and biological substances status: Secondary | ICD-10-CM | POA: Diagnosis not present

## 2021-12-12 DIAGNOSIS — M899 Disorder of bone, unspecified: Secondary | ICD-10-CM | POA: Diagnosis not present

## 2021-12-12 DIAGNOSIS — M25774 Osteophyte, right foot: Secondary | ICD-10-CM | POA: Diagnosis not present

## 2021-12-12 DIAGNOSIS — Z885 Allergy status to narcotic agent status: Secondary | ICD-10-CM | POA: Diagnosis not present

## 2021-12-12 DIAGNOSIS — M2041 Other hammer toe(s) (acquired), right foot: Secondary | ICD-10-CM | POA: Diagnosis not present

## 2021-12-16 DIAGNOSIS — Z981 Arthrodesis status: Secondary | ICD-10-CM | POA: Diagnosis not present

## 2021-12-23 DIAGNOSIS — Z4789 Encounter for other orthopedic aftercare: Secondary | ICD-10-CM | POA: Diagnosis not present

## 2021-12-29 DIAGNOSIS — I7300 Raynaud's syndrome without gangrene: Secondary | ICD-10-CM | POA: Diagnosis not present

## 2021-12-29 DIAGNOSIS — J0100 Acute maxillary sinusitis, unspecified: Secondary | ICD-10-CM | POA: Diagnosis not present

## 2021-12-29 DIAGNOSIS — M2550 Pain in unspecified joint: Secondary | ICD-10-CM | POA: Diagnosis not present

## 2021-12-31 DIAGNOSIS — F3341 Major depressive disorder, recurrent, in partial remission: Secondary | ICD-10-CM | POA: Diagnosis not present

## 2021-12-31 DIAGNOSIS — M810 Age-related osteoporosis without current pathological fracture: Secondary | ICD-10-CM | POA: Diagnosis not present

## 2021-12-31 DIAGNOSIS — G8929 Other chronic pain: Secondary | ICD-10-CM | POA: Diagnosis not present

## 2021-12-31 DIAGNOSIS — E7800 Pure hypercholesterolemia, unspecified: Secondary | ICD-10-CM | POA: Diagnosis not present

## 2021-12-31 DIAGNOSIS — G43009 Migraine without aura, not intractable, without status migrainosus: Secondary | ICD-10-CM | POA: Diagnosis not present

## 2022-01-06 DIAGNOSIS — Z4789 Encounter for other orthopedic aftercare: Secondary | ICD-10-CM | POA: Diagnosis not present

## 2022-01-07 DIAGNOSIS — M797 Fibromyalgia: Secondary | ICD-10-CM | POA: Diagnosis not present

## 2022-01-07 DIAGNOSIS — M79642 Pain in left hand: Secondary | ICD-10-CM | POA: Diagnosis not present

## 2022-01-07 DIAGNOSIS — M79641 Pain in right hand: Secondary | ICD-10-CM | POA: Diagnosis not present

## 2022-01-07 DIAGNOSIS — Z6824 Body mass index (BMI) 24.0-24.9, adult: Secondary | ICD-10-CM | POA: Diagnosis not present

## 2022-01-07 DIAGNOSIS — M792 Neuralgia and neuritis, unspecified: Secondary | ICD-10-CM | POA: Diagnosis not present

## 2022-01-07 DIAGNOSIS — R5383 Other fatigue: Secondary | ICD-10-CM | POA: Diagnosis not present

## 2022-01-07 DIAGNOSIS — M1991 Primary osteoarthritis, unspecified site: Secondary | ICD-10-CM | POA: Diagnosis not present

## 2022-01-20 DIAGNOSIS — Z4789 Encounter for other orthopedic aftercare: Secondary | ICD-10-CM | POA: Diagnosis not present

## 2022-01-23 DIAGNOSIS — M533 Sacrococcygeal disorders, not elsewhere classified: Secondary | ICD-10-CM | POA: Diagnosis not present

## 2022-01-23 DIAGNOSIS — M25551 Pain in right hip: Secondary | ICD-10-CM | POA: Diagnosis not present

## 2022-01-23 DIAGNOSIS — M5136 Other intervertebral disc degeneration, lumbar region: Secondary | ICD-10-CM | POA: Diagnosis not present

## 2022-01-23 DIAGNOSIS — M961 Postlaminectomy syndrome, not elsewhere classified: Secondary | ICD-10-CM | POA: Diagnosis not present

## 2022-01-30 DIAGNOSIS — F419 Anxiety disorder, unspecified: Secondary | ICD-10-CM | POA: Diagnosis not present

## 2022-01-30 DIAGNOSIS — Z9109 Other allergy status, other than to drugs and biological substances: Secondary | ICD-10-CM | POA: Diagnosis not present

## 2022-01-30 DIAGNOSIS — I7300 Raynaud's syndrome without gangrene: Secondary | ICD-10-CM | POA: Diagnosis not present

## 2022-02-03 DIAGNOSIS — T7840XS Allergy, unspecified, sequela: Secondary | ICD-10-CM | POA: Diagnosis not present

## 2022-02-03 DIAGNOSIS — L749 Eccrine sweat disorder, unspecified: Secondary | ICD-10-CM | POA: Diagnosis not present

## 2022-02-10 DIAGNOSIS — L089 Local infection of the skin and subcutaneous tissue, unspecified: Secondary | ICD-10-CM | POA: Diagnosis not present

## 2022-02-13 DIAGNOSIS — T847XXA Infection and inflammatory reaction due to other internal orthopedic prosthetic devices, implants and grafts, initial encounter: Secondary | ICD-10-CM | POA: Diagnosis not present

## 2022-02-13 DIAGNOSIS — G8929 Other chronic pain: Secondary | ICD-10-CM | POA: Diagnosis not present

## 2022-02-13 DIAGNOSIS — L03115 Cellulitis of right lower limb: Secondary | ICD-10-CM | POA: Diagnosis not present

## 2022-02-13 DIAGNOSIS — M797 Fibromyalgia: Secondary | ICD-10-CM | POA: Diagnosis not present

## 2022-02-13 DIAGNOSIS — I10 Essential (primary) hypertension: Secondary | ICD-10-CM | POA: Diagnosis not present

## 2022-02-13 DIAGNOSIS — K219 Gastro-esophageal reflux disease without esophagitis: Secondary | ICD-10-CM | POA: Diagnosis not present

## 2022-02-13 DIAGNOSIS — T849XXA Unspecified complication of internal orthopedic prosthetic device, implant and graft, initial encounter: Secondary | ICD-10-CM | POA: Diagnosis not present

## 2022-02-13 DIAGNOSIS — A498 Other bacterial infections of unspecified site: Secondary | ICD-10-CM | POA: Diagnosis not present

## 2022-02-13 DIAGNOSIS — L089 Local infection of the skin and subcutaneous tissue, unspecified: Secondary | ICD-10-CM | POA: Diagnosis not present

## 2022-02-13 DIAGNOSIS — F419 Anxiety disorder, unspecified: Secondary | ICD-10-CM | POA: Diagnosis not present

## 2022-02-13 DIAGNOSIS — E785 Hyperlipidemia, unspecified: Secondary | ICD-10-CM | POA: Diagnosis not present

## 2022-02-13 DIAGNOSIS — T8469XA Infection and inflammatory reaction due to internal fixation device of other site, initial encounter: Secondary | ICD-10-CM | POA: Diagnosis not present

## 2022-02-13 DIAGNOSIS — Z4789 Encounter for other orthopedic aftercare: Secondary | ICD-10-CM | POA: Diagnosis not present

## 2022-02-13 DIAGNOSIS — M7731 Calcaneal spur, right foot: Secondary | ICD-10-CM | POA: Diagnosis not present

## 2022-02-13 DIAGNOSIS — F4310 Post-traumatic stress disorder, unspecified: Secondary | ICD-10-CM | POA: Diagnosis not present

## 2022-02-14 DIAGNOSIS — L089 Local infection of the skin and subcutaneous tissue, unspecified: Secondary | ICD-10-CM | POA: Diagnosis not present

## 2022-02-14 DIAGNOSIS — A498 Other bacterial infections of unspecified site: Secondary | ICD-10-CM | POA: Diagnosis not present

## 2022-02-14 DIAGNOSIS — T849XXA Unspecified complication of internal orthopedic prosthetic device, implant and graft, initial encounter: Secondary | ICD-10-CM | POA: Diagnosis not present

## 2022-02-15 DIAGNOSIS — T849XXA Unspecified complication of internal orthopedic prosthetic device, implant and graft, initial encounter: Secondary | ICD-10-CM | POA: Diagnosis not present

## 2022-02-15 DIAGNOSIS — L089 Local infection of the skin and subcutaneous tissue, unspecified: Secondary | ICD-10-CM | POA: Diagnosis not present

## 2022-02-15 DIAGNOSIS — A498 Other bacterial infections of unspecified site: Secondary | ICD-10-CM | POA: Diagnosis not present

## 2022-02-16 DIAGNOSIS — T849XXA Unspecified complication of internal orthopedic prosthetic device, implant and graft, initial encounter: Secondary | ICD-10-CM | POA: Diagnosis not present

## 2022-02-16 DIAGNOSIS — L089 Local infection of the skin and subcutaneous tissue, unspecified: Secondary | ICD-10-CM | POA: Diagnosis not present

## 2022-02-16 DIAGNOSIS — A498 Other bacterial infections of unspecified site: Secondary | ICD-10-CM | POA: Diagnosis not present

## 2022-02-20 DIAGNOSIS — A498 Other bacterial infections of unspecified site: Secondary | ICD-10-CM | POA: Diagnosis not present

## 2022-02-20 DIAGNOSIS — L089 Local infection of the skin and subcutaneous tissue, unspecified: Secondary | ICD-10-CM | POA: Diagnosis not present

## 2022-02-20 DIAGNOSIS — T849XXA Unspecified complication of internal orthopedic prosthetic device, implant and graft, initial encounter: Secondary | ICD-10-CM | POA: Diagnosis not present

## 2022-02-20 DIAGNOSIS — I7300 Raynaud's syndrome without gangrene: Secondary | ICD-10-CM | POA: Diagnosis not present

## 2022-03-02 ENCOUNTER — Encounter: Payer: Medicare HMO | Admitting: Physical Medicine and Rehabilitation

## 2022-03-02 DIAGNOSIS — Z4789 Encounter for other orthopedic aftercare: Secondary | ICD-10-CM | POA: Diagnosis not present

## 2022-03-10 DIAGNOSIS — M86271 Subacute osteomyelitis, right ankle and foot: Secondary | ICD-10-CM | POA: Diagnosis not present

## 2022-03-10 DIAGNOSIS — Z9889 Other specified postprocedural states: Secondary | ICD-10-CM | POA: Diagnosis not present

## 2022-03-10 DIAGNOSIS — A498 Other bacterial infections of unspecified site: Secondary | ICD-10-CM | POA: Diagnosis not present

## 2022-03-11 DIAGNOSIS — L749 Eccrine sweat disorder, unspecified: Secondary | ICD-10-CM | POA: Diagnosis not present

## 2022-03-11 DIAGNOSIS — M26609 Unspecified temporomandibular joint disorder, unspecified side: Secondary | ICD-10-CM | POA: Diagnosis not present

## 2022-03-11 DIAGNOSIS — I7300 Raynaud's syndrome without gangrene: Secondary | ICD-10-CM | POA: Diagnosis not present

## 2022-03-17 DIAGNOSIS — Z4789 Encounter for other orthopedic aftercare: Secondary | ICD-10-CM | POA: Diagnosis not present

## 2022-03-17 DIAGNOSIS — L089 Local infection of the skin and subcutaneous tissue, unspecified: Secondary | ICD-10-CM | POA: Diagnosis not present

## 2022-03-31 DIAGNOSIS — M86171 Other acute osteomyelitis, right ankle and foot: Secondary | ICD-10-CM | POA: Diagnosis not present

## 2022-03-31 DIAGNOSIS — A498 Other bacterial infections of unspecified site: Secondary | ICD-10-CM | POA: Diagnosis not present

## 2022-03-31 DIAGNOSIS — Z4789 Encounter for other orthopedic aftercare: Secondary | ICD-10-CM | POA: Diagnosis not present

## 2022-03-31 DIAGNOSIS — Z9889 Other specified postprocedural states: Secondary | ICD-10-CM | POA: Diagnosis not present

## 2022-04-03 DIAGNOSIS — M546 Pain in thoracic spine: Secondary | ICD-10-CM | POA: Diagnosis not present

## 2022-04-03 DIAGNOSIS — M542 Cervicalgia: Secondary | ICD-10-CM | POA: Diagnosis not present

## 2022-04-03 DIAGNOSIS — M25551 Pain in right hip: Secondary | ICD-10-CM | POA: Diagnosis not present

## 2022-04-03 DIAGNOSIS — M533 Sacrococcygeal disorders, not elsewhere classified: Secondary | ICD-10-CM | POA: Diagnosis not present

## 2022-04-03 DIAGNOSIS — M5136 Other intervertebral disc degeneration, lumbar region: Secondary | ICD-10-CM | POA: Diagnosis not present

## 2022-04-03 DIAGNOSIS — M961 Postlaminectomy syndrome, not elsewhere classified: Secondary | ICD-10-CM | POA: Diagnosis not present

## 2022-04-04 DIAGNOSIS — M5134 Other intervertebral disc degeneration, thoracic region: Secondary | ICD-10-CM | POA: Diagnosis not present

## 2022-04-04 DIAGNOSIS — M546 Pain in thoracic spine: Secondary | ICD-10-CM | POA: Diagnosis not present

## 2022-04-04 DIAGNOSIS — M542 Cervicalgia: Secondary | ICD-10-CM | POA: Diagnosis not present

## 2022-04-04 DIAGNOSIS — M47812 Spondylosis without myelopathy or radiculopathy, cervical region: Secondary | ICD-10-CM | POA: Diagnosis not present

## 2022-04-04 DIAGNOSIS — M47814 Spondylosis without myelopathy or radiculopathy, thoracic region: Secondary | ICD-10-CM | POA: Diagnosis not present

## 2022-04-10 DIAGNOSIS — R5382 Chronic fatigue, unspecified: Secondary | ICD-10-CM | POA: Diagnosis not present

## 2022-04-10 DIAGNOSIS — R0683 Snoring: Secondary | ICD-10-CM | POA: Diagnosis not present

## 2022-04-10 DIAGNOSIS — Z1331 Encounter for screening for depression: Secondary | ICD-10-CM | POA: Diagnosis not present

## 2022-04-12 NOTE — Progress Notes (Unsigned)
New Patient Note  RE: CAMBELLE SUCHECKI MRN: [id number] DOB: [birth year] Date of Office Visit: 04/13/2022  Consult requested by: Faustino Congress, NP Primary care provider: Faustino Congress, NP  Chief Complaint: No chief complaint on file.  History of Present Illness: I had the pleasure of seeing Naavya Postma for initial evaluation at the [facility] on 04/12/2022. She is a 66 y.o. female, who is referred here by Faustino Congress, NP for the evaluation of metal allergy.  Patient is scheduled for *** replacement on ***. Patient had issues with ***.  No issues with watches, rings, bracelets, necklaces, belts, buttons on pants.   Prior joint replacements or metals in the body: ***.  Assessment and Plan: Abiha is a 66 y.o. female with: No problem-specific Assessment & Plan notes found for this encounter.  No follow-ups on file.  No orders of the defined types were placed in this encounter.  Lab Orders  No laboratory test(s) ordered today    Other allergy screening: Asthma: {Blank single:19197::"yes","no"} Rhino conjunctivitis: {Blank single:19197::"yes","no"} Food allergy: {Blank single:19197::"yes","no"} Medication allergy: {Blank single:19197::"yes","no"} Hymenoptera allergy: {Blank single:19197::"yes","no"} Urticaria: {Blank single:19197::"yes","no"} Eczema:{Blank single:19197::"yes","no"} History of recurrent infections suggestive of immunodeficency: {Blank single:19197::"yes","no"}  Diagnostics: Spirometry:  Tracings reviewed. Her effort: {Blank single:19197::"Good reproducible efforts.","It was hard to get consistent efforts and there is a question as to whether this reflects a maximal maneuver.","Poor effort, data can not be interpreted."} FVC: ***L FEV1: ***L, ***% predicted FEV1/FVC ratio: ***% Interpretation: {Blank single:19197::"Spirometry consistent with mild obstructive disease","Spirometry consistent with moderate obstructive  disease","Spirometry consistent with severe obstructive disease","Spirometry consistent with possible restrictive disease","Spirometry consistent with mixed obstructive and restrictive disease","Spirometry uninterpretable due to technique","Spirometry consistent with normal pattern","No overt abnormalities noted given today's efforts"}.  Please see scanned spirometry results for details.  Skin Testing: {Blank single:19197::"Select foods","Environmental allergy panel","Environmental allergy panel and select foods","Food allergy panel","None","Deferred due to recent antihistamines use"}. *** Results discussed with patient/family.   Past Medical History: Patient Active Problem List   Diagnosis Date Noted  . S/P lumbar microdiscectomy 04/30/2021  . S/P hardware removal 09/12/2020  . Radiculopathy 06/29/2019  . Cervical radiculopathy 08/15/2018  . Chronic sinusitis 07/27/2018  . Constipation 07/27/2018  . Chronic bilateral low back pain with bilateral sciatica 07/01/2018  . Disorder of bone and cartilage 04/04/2018  . Esophageal reflux 04/04/2018  . Other and unspecified hyperlipidemia 04/04/2018  . Other malaise and fatigue 04/04/2018  . Pain in joint involving ankle and foot 04/04/2018  . Pain in soft tissues of limb 04/04/2018  . Posttraumatic stress disorder 04/04/2018  . Raynaud's phenomenon 04/04/2018  . Skin sensation disturbance 04/04/2018  . Sleep disturbance 04/04/2018  . Vitamin D deficiency 04/04/2018  . Myoclonus, segmental 10/18/2017  . Adjacent segment disease with spinal stenosis 10/04/2017  . DDD (degenerative disc disease), lumbar 10/30/2016  . Spondylolisthesis of lumbar region 08/21/2016  . Overweight with body mass index (BMI) 25.0-29.9 07/22/2016  . Capsulitis of metatarsophalangeal (MTP) joint of right foot 03/23/2016  . Lumbar spinal stenosis 12/05/2015  . Migraine 09/13/2012  . Allergic rhinitis 09/13/2012  . Neuropathy 09/13/2012   Past Medical History:   Diagnosis Date  . Allergic rhinitis   . Anxiety   . Arthritis   . Cancer ([facility])    skin  . Chronic pain   . Chronically dry eyes   . Depression   . Fibromyalgia   . GERD (gastroesophageal reflux disease)   . Headache   . Osteoporosis   . Vitamin D deficiency   .  Wears glasses    Past Surgical History: Past Surgical History:  Procedure Laterality Date  . ABDOMINAL HYSTERECTOMY    . BACK SURGERY    . BUNIONECTOMY Bilateral   . EYE SURGERY     laser surgery  . NASAL SINUS SURGERY     x2  . SACROILIAC JOINT FUSION Bilateral 09/12/2020   Procedure: REMOVAL OF BILATERAL PELVIC SCREWS;  Surgeon: Phylliss Bob, MD;  Location: [facility];  Service: Orthopedics;  Laterality: Bilateral;  . SKIN CANCER EXCISION     face x3  . TUBAL LIGATION     Medication List:  Current Outpatient Medications  Medication Sig Dispense Refill  . acetaminophen (TYLENOL) 325 MG tablet Take 650 mg by mouth every 6 (six) hours as needed for moderate pain.    . Carboxymethylcellulose Sodium (THERATEARS OP) Apply 1 drop to eye 4 (four) times daily as needed (dry eyes).    . cetirizine (ZYRTEC) 10 MG tablet Take 10 mg by mouth daily as needed for allergies.    . cholecalciferol (VITAMIN D) 25 MCG (1000 UNIT) tablet Take 2,000 Units by mouth in the morning.    . cyclobenzaprine (FLEXERIL) 10 MG tablet Take 1 tablet (10 mg total) by mouth 2 (two) times daily as needed for muscle spasms. 12 tablet 0  . diclofenac Sodium (VOLTAREN) 1 % GEL Apply topically.    . DRYSOL 20 % external solution Apply 1 application topically daily as needed (sweat).    Marland Kitchen EPINEPHrine 0.3 mg/0.3 mL IJ SOAJ injection see administration instructions.    Marland Kitchen escitalopram (LEXAPRO) 20 MG tablet Take 20 mg by mouth daily.    . fluticasone (FLONASE) 50 MCG/ACT nasal spray Place 2 sprays into both nostrils 2 (two) times daily as needed for allergies or rhinitis.    Marland Kitchen lidocaine (LIDODERM) 5 % 1 patch daily.    . magnesium 30 MG tablet Take 30 mg by  mouth 2 (two) times daily.    . nitroGLYCERIN (NITRO-DUR) 0.2 mg/hr patch Place 1 patch (0.2 mg total) onto the skin daily. (Patient not taking: Reported on 10/06/2021) 30 patch 2  . rizatriptan (MAXALT) 10 MG tablet Take 10 mg by mouth daily as needed for migraine.     . saccharomyces boulardii (FLORASTOR) 250 MG capsule Take 500 mg by mouth daily.    . sulindac (CLINORIL) 150 MG tablet Take by mouth.     No current facility-administered medications for this visit.   Allergies: Allergies  Allergen Reactions  . Gabapentin Other (See Comments)    Delirium, stroke like symptoms  Other reaction(s): Unknown Other reaction(s): Unknown  . Amitriptyline Other (See Comments)  . Polymyxin B Other (See Comments)    Eyes go blood red   . Zolpidem Rash    "sleep walk"   . Cymbalta [Duloxetine Hcl] Other (See Comments)    Headaches, constipation  . Fluoxetine Other (See Comments)    CONSTIPATION   . Lamotrigine Rash  . Other Other (See Comments)    OTOBIOTIC > RED EYES  . Pregabalin Rash    Itchy red rash on chest  Other reaction(s): Unknown Other reaction(s): Unknown   Social History: Social History   Socioeconomic History  . Marital status: Widowed    Spouse name: Not on file  . Number of children: 2  . Years of education: Not on file  . Highest education level: Not on file  Occupational History  . Not on file  Tobacco Use  . Smoking status: Never  . Smokeless  tobacco: Never  Vaping Use  . Vaping Use: Never used  Substance and Sexual Activity  . Alcohol use: No  . Drug use: No  . Sexual activity: Not Currently    Birth control/protection: Surgical    Comment: Hysterectomy  Other Topics Concern  . Not on file  Social History Narrative   Right handed   Lives in a one story home   Drinks Tea   Social Determinants of Health   Financial Resource Strain: Not on file  Food Insecurity: Not on file  Transportation Needs: Not on file  Physical Activity: Not on file   Stress: Not on file  Social Connections: Not on file   Lives in a ***. Smoking: *** Occupation: ***  Environmental HistoryFreight forwarder in the house: Estate agent in the family room: {Blank single:19197::"yes","no"} Carpet in the bedroom: {Blank single:19197::"yes","no"} Heating: {Blank single:19197::"electric","gas","heat pump"} Cooling: {Blank single:19197::"central","window","heat pump"} Pet: {Blank single:19197::"yes ***","no"}  Family History: Family History  Problem Relation Age of Onset  . Depression Mother   . Heart attack Mother   . COPD Mother   . Depression Father   . Heart attack Father   . ADD / ADHD Son    Problem                               Relation Asthma                                   *** Eczema                                *** Food allergy                          *** Allergic rhino conjunctivitis     ***  Review of Systems  Constitutional:  Negative for appetite change, chills, fever and unexpected weight change.  HENT:  Negative for congestion and rhinorrhea.   Eyes:  Negative for itching.  Respiratory:  Negative for cough, chest tightness, shortness of breath and wheezing.   Cardiovascular:  Negative for chest pain.  Gastrointestinal:  Negative for abdominal pain.  Genitourinary:  Negative for difficulty urinating.  Skin:  Negative for rash.  Neurological:  Negative for headaches.   Objective: There were no vitals taken for this visit. There is no height or weight on file to calculate BMI. Physical Exam Vitals and nursing note reviewed.  Constitutional:      Appearance: Normal appearance. She is well-developed.  HENT:     Head: Normocephalic and atraumatic.     Right Ear: Tympanic membrane and external ear normal.     Left Ear: Tympanic membrane and external ear normal.     Nose: Nose normal.     Mouth/Throat:     Mouth: Mucous membranes are moist.     Pharynx: Oropharynx is clear.  Eyes:      Conjunctiva/sclera: Conjunctivae normal.  Cardiovascular:     Rate and Rhythm: Normal rate and regular rhythm.     Heart sounds: Normal heart sounds. No murmur heard.    No friction rub. No gallop.  Pulmonary:     Effort: Pulmonary effort is normal.     Breath sounds: Normal breath sounds. No wheezing, rhonchi or rales.  Musculoskeletal:  Cervical back: Neck supple.  Skin:    General: Skin is warm.     Findings: No rash.  Neurological:     Mental Status: She is alert and oriented to person, place, and time.  Psychiatric:        Behavior: Behavior normal.  The plan was reviewed with the patient/family, and all questions/concerned were addressed.  It was my pleasure to see Murline today and participate in her care. Please feel free to contact me with any questions or concerns.  Sincerely,  Rexene Alberts, DO Allergy & Immunology  [facility] office: [place] office: [phone]

## 2022-04-13 ENCOUNTER — Other Ambulatory Visit: Payer: Self-pay

## 2022-04-13 ENCOUNTER — Ambulatory Visit (INDEPENDENT_AMBULATORY_CARE_PROVIDER_SITE_OTHER): Payer: Medicare HMO | Admitting: Allergy

## 2022-04-13 ENCOUNTER — Encounter: Payer: Self-pay | Admitting: Allergy

## 2022-04-13 VITALS — BP 126/78 | HR 102 | Temp 97.2°F | Resp 18 | Ht 63.0 in | Wt 138.8 lb

## 2022-04-13 DIAGNOSIS — L2389 Allergic contact dermatitis due to other agents: Secondary | ICD-10-CM | POA: Diagnosis not present

## 2022-04-13 DIAGNOSIS — J3089 Other allergic rhinitis: Secondary | ICD-10-CM

## 2022-04-13 NOTE — Assessment & Plan Note (Signed)
Managed by [facility] and is on AIT for this.  Continue treatment as per their recommendations.

## 2022-04-13 NOTE — Patient Instructions (Signed)
Discussed with patient that patch testing tests for contact dermatitis and sometimes it does not correlate to how one will react to metals in the body. Positive patch testing results can help in avoiding those items however it is possible to get false negative results.  Nevertheless, this is the most accessible test for metal sensitivity currently available.  Patches placed today. Please avoid strenuous physical activities and do not get the patches on the back wet. No showering until final patch reading done. Okay to take antihistamines for itching but avoid placing any creams on the back where the patches are. We will remove the patches on Wednesday and will do our initial read. Then you will come back on Friday for a final read.   If the patch testing is positive to titanium then we have an answer. If it's negative, still hesitant as you had 2 separate surgeries where you had issues with titanium.  Follow up in 2 days for patch reading.

## 2022-04-13 NOTE — Assessment & Plan Note (Addendum)
Concerned about titanium allergy. Patient had titanium hardware removed from spine 1 year after surgery due to persistent pain. Of note she had no issues right after surgery and no infections. More recently she had bunion surgery with titanium that caused large localized swelling and pain (developed osteomyelitis). That resolved after removal of titanium. Needing right should surgery with titanium.   Discussed with patient that patch testing tests for contact dermatitis and sometimes it does not correlate to how one will react to metals in the body. Positive patch testing results can help in avoiding those items however it is possible to get false negative results.  Nevertheless, this is the most accessible test for metal sensitivity currently available.   I don't have patch testing available for cements/glues that are used in joint replacements. If this is something that her surgeon would need then she needs to be referred to an academic dermatology center where they offer the above testing.  Even if the patch testing is negative to titanium, it should be noted that she had issues with titanium on 2 separate occasions.   . Metal Patches placed today. . Please avoid strenuous physical activities and do not get the patches on the back wet. No showering until final patch reading done. Faythe Ghee to take antihistamines for itching but avoid placing any creams on the back where the patches are. . We will remove the patches on Wednesday and will do our initial read. . Then you will come back on Friday for a final read.

## 2022-04-14 DIAGNOSIS — L570 Actinic keratosis: Secondary | ICD-10-CM | POA: Diagnosis not present

## 2022-04-14 DIAGNOSIS — Z85828 Personal history of other malignant neoplasm of skin: Secondary | ICD-10-CM | POA: Diagnosis not present

## 2022-04-14 DIAGNOSIS — D229 Melanocytic nevi, unspecified: Secondary | ICD-10-CM | POA: Diagnosis not present

## 2022-04-14 DIAGNOSIS — X32XXXS Exposure to sunlight, sequela: Secondary | ICD-10-CM | POA: Diagnosis not present

## 2022-04-14 DIAGNOSIS — L814 Other melanin hyperpigmentation: Secondary | ICD-10-CM | POA: Diagnosis not present

## 2022-04-15 ENCOUNTER — Encounter: Payer: Self-pay | Admitting: Allergy

## 2022-04-15 ENCOUNTER — Ambulatory Visit: Admitting: Allergy

## 2022-04-15 DIAGNOSIS — L2389 Allergic contact dermatitis due to other agents: Secondary | ICD-10-CM

## 2022-04-15 NOTE — Progress Notes (Signed)
    Follow-up Note  RE: Sharon Logan MRN: [id number] DOB: 01-06-1956 Date of Office Visit: 04/15/2022  Primary care provider: Jolinda Croak, MD Referring provider: Jolinda Croak, MD   Everline returns to the office today for the initial patch test interpretation.   Diagnostics:  Metal patch series 48 hour reading:   Metals Patch     Row Name 04/15/22 1600           Location Back       Number of Test 11       Reading Interval Day 3       Aluminum Hydroxide 10% 0       Chromium chloride 1% 0       Cobalt chloride hexahydrate 1% 0       Molybdenum chloride 0.5% 0       Nickel sulfate hexahydrate 5% 0       Potassium dichromate 0.25% 0       Copper sulfate pentahydrate 2% 0       Tantal 1% 0       Titanium 0.1% 0       Manganese chloride 0.5% 0       Vanadium Pentoxide 10% 0                   Plan:  Metal allergy patch testing negative at 48 hours.  She will return in 2 days for final reading.   Prudy Feeler, MD [facility]

## 2022-04-17 ENCOUNTER — Ambulatory Visit: Admitting: Internal Medicine

## 2022-04-17 DIAGNOSIS — L2389 Allergic contact dermatitis due to other agents: Secondary | ICD-10-CM

## 2022-04-17 NOTE — Progress Notes (Signed)
   Follow Up Note  RE: Sharon Logan MRN: [id number] DOB: 08/31/1955 Date of Office Visit: 04/17/2022  Referring provider: Jolinda Croak, MD Primary care provider: Jolinda Croak, MD  History of Present Illness: I had the pleasure of seeing Sharon Logan for a follow up visit at the [facility] on 04/17/2022. She is a 66 y.o. female, who is being followed for contact dermatitis. Today she is here for final patch test interpretation, given suspected history of contact dermatitis.   There is concern for titanium related contact dermatitis.  She previously had a titanium based back fusion and had severe pain requiring removal after which the pain was resolved.  Then she had left toe surgery due to bunion and titanium was used.  About 1 month later, had localized swelling and pain.  Removal led to improvement in symptoms.   She now required a R shoulder surgery.  She is seeing her Ortho tomorrow.  She is hoping he can do a surgery without any metals.   Diagnostics:  TRUE TEST 96 hour reading:   Metals Patch - 04/17/22 1000     Location Back    Number of Test 11    Reading Interval Day 3    Aluminum Hydroxide 10% 0    Chromium chloride 1% 0    Cobalt chloride hexahydrate 1% 0    Molybdenum chloride 0.5% 0    Nickel sulfate hexahydrate 5% 0    Potassium dichromate 0.25% 0    Copper sulfate pentahydrate 2% 0    Tantal 1% 0    Titanium 0.1% 0    Manganese chloride 0.5% 0    Vanadium Pentoxide 10% 0              Assessment and Plan: Sharon Logan is a 66 y.o. female with: Concern for Contact Dermatitis: Discussed with patient that patch testing tests for contact dermatitis sometimes does not correlate to how one will react to metals in the body. Positive patch testing results can help in avoiding those items, however it is possible to get false negative results.  I don't have patch testing available for cements/glues that are used in joint replacements. If this  is something that her surgeon would need then she needs to be referred to an academic dermatology center where they offer the above testing.  Final metal patch test was negative. However, discussed that the sensitivity of patch testing is limited and her history of 2 clinical reactions to titanium/foreign objects with improvement in symptoms following removal are highly concerning.    Follow up with Ortho.   Return if symptoms worsen or fail to improve.  It was my pleasure to see Sharon Logan today and participate in her care. Please feel free to contact me with any questions or concerns.  Sincerely,   Harlon Flor, MD [facility]

## 2022-04-17 NOTE — Patient Instructions (Addendum)
Discussed with patient that patch testing tests for contact dermatitis and sometimes it does not correlate to how one will react to metals in the body. Positive patch testing results can help in avoiding those items however it is possible to get false negative results.  Nevertheless, this is the most accessible test for metal sensitivity currently available.  I don't have patch testing available for cements/glues that are used in joint replacements. If this is something that her surgeon would need then she needs to be referred to an academic dermatology center where they offer the above testing.  Final metal patch test was negative. However, discussed that the sensitivity of patch testing is limited and her history of 2 clinical reactions to titanium foreign objects are concerning.   Follow up with Ortho.    [place] Name 04/17/2022           Location Back       Number of Test 11       Reading Interval Day 5       Aluminum Hydroxide 10% 0       Chromium chloride 1% 0       Cobalt chloride hexahydrate 1% 0       Molybdenum chloride 0.5% 0       Nickel sulfate hexahydrate 5% 0       Potassium dichromate 0.25% 0       Copper sulfate pentahydrate 2% 0       Tantal 1% 0       Titanium 0.1% 0       Manganese chloride 0.5% 0       Vanadium Pentoxide 10% 0

## 2022-04-20 DIAGNOSIS — F3341 Major depressive disorder, recurrent, in partial remission: Secondary | ICD-10-CM | POA: Diagnosis not present

## 2022-04-20 DIAGNOSIS — M797 Fibromyalgia: Secondary | ICD-10-CM | POA: Diagnosis not present

## 2022-04-20 DIAGNOSIS — G43009 Migraine without aura, not intractable, without status migrainosus: Secondary | ICD-10-CM | POA: Diagnosis not present

## 2022-04-20 DIAGNOSIS — G8929 Other chronic pain: Secondary | ICD-10-CM | POA: Diagnosis not present

## 2022-04-20 DIAGNOSIS — R5383 Other fatigue: Secondary | ICD-10-CM | POA: Diagnosis not present

## 2022-04-20 DIAGNOSIS — M810 Age-related osteoporosis without current pathological fracture: Secondary | ICD-10-CM | POA: Diagnosis not present

## 2022-04-20 DIAGNOSIS — G4719 Other hypersomnia: Secondary | ICD-10-CM | POA: Diagnosis not present

## 2022-04-20 DIAGNOSIS — E7800 Pure hypercholesterolemia, unspecified: Secondary | ICD-10-CM | POA: Diagnosis not present

## 2022-04-20 DIAGNOSIS — D508 Other iron deficiency anemias: Secondary | ICD-10-CM | POA: Diagnosis not present

## 2022-04-27 DIAGNOSIS — Z01818 Encounter for other preprocedural examination: Secondary | ICD-10-CM | POA: Diagnosis not present

## 2022-04-27 DIAGNOSIS — G4719 Other hypersomnia: Secondary | ICD-10-CM | POA: Diagnosis not present

## 2022-04-27 DIAGNOSIS — M5416 Radiculopathy, lumbar region: Secondary | ICD-10-CM | POA: Diagnosis not present

## 2022-04-27 DIAGNOSIS — I7300 Raynaud's syndrome without gangrene: Secondary | ICD-10-CM | POA: Diagnosis not present

## 2022-04-27 DIAGNOSIS — R209 Unspecified disturbances of skin sensation: Secondary | ICD-10-CM | POA: Diagnosis not present

## 2022-04-28 DIAGNOSIS — M25551 Pain in right hip: Secondary | ICD-10-CM | POA: Diagnosis not present

## 2022-04-28 DIAGNOSIS — M542 Cervicalgia: Secondary | ICD-10-CM | POA: Diagnosis not present

## 2022-04-28 DIAGNOSIS — M546 Pain in thoracic spine: Secondary | ICD-10-CM | POA: Diagnosis not present

## 2022-04-28 DIAGNOSIS — Z4789 Encounter for other orthopedic aftercare: Secondary | ICD-10-CM | POA: Diagnosis not present

## 2022-04-28 DIAGNOSIS — Z5181 Encounter for therapeutic drug level monitoring: Secondary | ICD-10-CM | POA: Diagnosis not present

## 2022-04-28 DIAGNOSIS — M5136 Other intervertebral disc degeneration, lumbar region: Secondary | ICD-10-CM | POA: Diagnosis not present

## 2022-04-28 DIAGNOSIS — M47812 Spondylosis without myelopathy or radiculopathy, cervical region: Secondary | ICD-10-CM | POA: Diagnosis not present

## 2022-04-28 DIAGNOSIS — Z79899 Other long term (current) drug therapy: Secondary | ICD-10-CM | POA: Diagnosis not present

## 2022-04-28 DIAGNOSIS — T849XXA Unspecified complication of internal orthopedic prosthetic device, implant and graft, initial encounter: Secondary | ICD-10-CM | POA: Diagnosis not present

## 2022-04-28 DIAGNOSIS — M961 Postlaminectomy syndrome, not elsewhere classified: Secondary | ICD-10-CM | POA: Diagnosis not present

## 2022-04-28 DIAGNOSIS — M533 Sacrococcygeal disorders, not elsewhere classified: Secondary | ICD-10-CM | POA: Diagnosis not present

## 2022-05-02 DIAGNOSIS — G4709 Other insomnia: Secondary | ICD-10-CM | POA: Diagnosis not present

## 2022-05-04 ENCOUNTER — Encounter: Payer: Medicare HMO | Attending: Psychology | Admitting: Psychology

## 2022-05-04 DIAGNOSIS — F331 Major depressive disorder, recurrent, moderate: Secondary | ICD-10-CM | POA: Insufficient documentation

## 2022-05-04 DIAGNOSIS — M7918 Myalgia, other site: Secondary | ICD-10-CM | POA: Diagnosis not present

## 2022-05-04 DIAGNOSIS — M5412 Radiculopathy, cervical region: Secondary | ICD-10-CM | POA: Diagnosis not present

## 2022-05-04 DIAGNOSIS — M5136 Other intervertebral disc degeneration, lumbar region: Secondary | ICD-10-CM | POA: Diagnosis not present

## 2022-05-04 DIAGNOSIS — M797 Fibromyalgia: Secondary | ICD-10-CM | POA: Diagnosis not present

## 2022-05-05 DIAGNOSIS — M5134 Other intervertebral disc degeneration, thoracic region: Secondary | ICD-10-CM | POA: Diagnosis not present

## 2022-05-05 DIAGNOSIS — Z888 Allergy status to other drugs, medicaments and biological substances status: Secondary | ICD-10-CM | POA: Diagnosis not present

## 2022-05-05 DIAGNOSIS — M542 Cervicalgia: Secondary | ICD-10-CM | POA: Diagnosis not present

## 2022-05-05 DIAGNOSIS — M545 Low back pain, unspecified: Secondary | ICD-10-CM | POA: Diagnosis not present

## 2022-05-05 DIAGNOSIS — M47817 Spondylosis without myelopathy or radiculopathy, lumbosacral region: Secondary | ICD-10-CM | POA: Diagnosis not present

## 2022-05-05 DIAGNOSIS — M8588 Other specified disorders of bone density and structure, other site: Secondary | ICD-10-CM | POA: Diagnosis not present

## 2022-05-05 DIAGNOSIS — M5136 Other intervertebral disc degeneration, lumbar region: Secondary | ICD-10-CM | POA: Diagnosis not present

## 2022-05-05 DIAGNOSIS — R202 Paresthesia of skin: Secondary | ICD-10-CM | POA: Diagnosis not present

## 2022-05-10 ENCOUNTER — Encounter: Payer: Self-pay | Admitting: Psychology

## 2022-05-10 NOTE — Progress Notes (Signed)
Neuropsychological Consultation   Patient:   Sharon Logan   DOB:   February 23, 1956  MR Number:  [id number]  Location:  [facility] [place], [place] [id number]  [place] [address] Dept: [phone]           Date of Service:   05/04/2022  Start Time:   4 PM End Time:   5 PM  Today's visit was an in person visit was conducted in my outpatient clinic office with the patient myself present.  Provider/Observer:  Ilean Skill, Psy.D.       Clinical Neuropsychologist       Billing Code/Service: 96158/96159   Reason for Service:  Sharon Logan is a 66 year old female referred by Leeroy Cha, MD in conjunction with her PCP Marda Stalker, PA-C for neuropsychological consultation and therapeutic interventions due to residual and ongoing chronic pain issues in the setting of fibromyalgia and prior lumbar back surgery with continued chronic pain.  The patient is also been coping with cervical myofascial pain syndrome and intolerance to cold.  The patient describes significant fatigue, difficulty walking and moving and has described her muscles as feeling like "dead weight" in both her arms and legs.  The patient reports that the symptoms have been going on for at least 4 years but has become more severe since August of this year.  2 years ago she had back surgery with fusion at L4-L5 and L5-S1.  The patient has recently been tried on oxycodone for pain and has been using it for 2 months to see if it was helpful.  She is taking this twice per day.  The patient reports that she was diagnosed with fibromyalgia in 2010 and has had periodic flareups particular around times of cold and that these flareups come and go.  Current medications include baclofen, oxycodone and Wellbutrin.  Patient has tried TENS units before and there have been discussions about possible spinal cord stimulator  trials but this has not been pursued at this point.  Patient with significant depressive response along with her chronic pain and cervical myofascial pain.  She has just begun using trigger point injections with her physiatrist.  The patient reports that she has had multiple back surgeries in 2018, 2019 and 2020.  Her current symptoms have become exacerbated and worsened in August of this year and reports that she is now in constant pain and she has been talking and addressing this issue with her orthopedist about the possibility of removing the hardware that was placed previously.  The patient describes her pain as sharp, burning, stabbing, tingling and aching.  The patient does have a past medical history including depression and anxiety type symptoms along with her cervical and lumbar region difficulties.  The patient has been identified with vitamin D deficiency, neuropathy, radiculopathy, posttraumatic stress disorder.  We did not get into the elements of her PTSD during this visit.  The patient reports that she has significant difficulty falling asleep and will take up to 2 hours to fall asleep and only gets about 4 to 5 hours of sleep each night.  The patient reports that her appetite is good and that her memory and cognition are good.  Most recent imaging was a CT scan of her back done in February 2022 the impression/interpretation of these findings showed Disc levels: L1-2: Mild disc degeneration.  Negative for stenosis   L2-3: Mild retrolisthesis. Disc bulging and mild  facet degeneration. Negative for stenosis.   L3-4: Pedicle screw and interbody fusion. Metal interbody spacer with subsidence into the endplates at V5-6. No solid bony fusion present. Pedicle screws in satisfactory position   L4-5: Pedicle screw and interbody fusion. Solid interbody fusion. Hardware in satisfactory position   L5-S1: Pedicle screw and interbody fusion. Persistent gas in the disc space. Probable  pseudarthrosis at this level.   Bilateral screws extend into the iliac bone across the SI joint bilaterally. No hardware lucency or fracture.   IMPRESSION: Pedicle screw and interbody fusion L3-4. Subsidence of the spacer without bony fusion   Solid fusion L4-5   Pedicle screw and interbody fusion L5-S1 with probable pseudarthrosis.   Behavioral Observation: Sharon Logan  presents as a 66 y.o.-year-old Right Caucasian Female who appeared her stated age. her dress was Appropriate and she was Well Groomed and her manners were Appropriate to the situation.  her participation was indicative of Appropriate and Attentive behaviors.  There were physical disabilities noted.  she displayed an appropriate level of cooperation and motivation.     Interactions:    Active Appropriate  Attention:   within normal limits and attention span and concentration were age appropriate  Memory:   within normal limits; recent and remote memory intact  Visuo-spatial:  not examined  Speech (Volume):  normal  Speech:   normal; normal  Thought Process:  Coherent and Relevant  Though Content:  WNL; not suicidal and not homicidal  Orientation:   person, place, time/date, and situation  Judgment:   Good  Planning:   Good  Affect:    Appropriate  Mood:    Dysphoric  Insight:   Good  Intelligence:   high  Marital Status/Living: The patient was born and raised in [place] along with 2 siblings.  She currently lives by herself.  The patient was married previously for 14 years and is now widowed.  The patient is a 28 year old daughter and a 14 year old son.  Current Employment: The patient is retired.  Past Employment:  The patient worked for the [employer] as a Scientist, clinical (histocompatibility and immunogenetics) for 34 years.  Hobbies and interest have included reading, walking and some gardening.  The patient was also in the [employer] serving in the [employer] between [employer].  Highest rank was E4 and her rank at  discharge was E4.  She had an honorable discharge.  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  Substance Use:  No concerns of substance abuse are reported.  Patient denies any substance use or abuse.  She denies any alcohol consumption or tobacco use.  Education:   The patient completed her bachelor's degree in business achieving at ending GPA of 3.26.  She has attended [employer] in [place].  Her best subject was history and reports that she did have minor difficulties relative to her good academic performance in math.  Medical History:   Past Medical History:  Diagnosis Date   Allergic rhinitis    Anxiety    Arthritis    Cancer (HCC)    skin   Chronic pain    Chronically dry eyes    Depression    Fibromyalgia    GERD (gastroesophageal reflux disease)    Headache    Osteoporosis    Vitamin D deficiency    Wears glasses          Patient Active Problem List   Diagnosis Date Noted   Allergic  contact dermatitis due to other agents 04/13/2022   S/P lumbar microdiscectomy 04/30/2021   S/P hardware removal 09/12/2020   Radiculopathy 06/29/2019   Cervical radiculopathy 08/15/2018   Chronic sinusitis 07/27/2018   Constipation 07/27/2018   Chronic bilateral low back pain with bilateral sciatica 07/01/2018   Disorder of bone and cartilage 04/04/2018   Esophageal reflux 04/04/2018   Other and unspecified hyperlipidemia 04/04/2018   Other malaise and fatigue 04/04/2018   Pain in joint involving ankle and foot 04/04/2018   Pain in soft tissues of limb 04/04/2018   Posttraumatic stress disorder 04/04/2018   Raynaud's phenomenon 04/04/2018   Skin sensation disturbance 04/04/2018   Sleep disturbance 04/04/2018   Vitamin D deficiency 04/04/2018   Myoclonus, segmental 10/18/2017   Adjacent segment disease with spinal stenosis 10/04/2017   DDD (degenerative disc disease), lumbar 10/30/2016   Spondylolisthesis of lumbar region 08/21/2016    Overweight with body mass index (BMI) 25.0-29.9 07/22/2016   Capsulitis of metatarsophalangeal (MTP) joint of right foot 03/23/2016   Lumbar spinal stenosis 12/05/2015   Migraine 09/13/2012   Other allergic rhinitis 09/13/2012   Neuropathy 09/13/2012              Abuse/Trauma History: Patient does have a history of previous diagnosis of posttraumatic stress disorder but we have not reviewed this yet.  Psychiatric History:  Patient has a past psychiatric history including depression and anxiety as well as PTSD.  Family Med/Psych History:  Family History  Problem Relation Age of Onset   Depression Mother    Heart attack Mother    COPD Mother    Depression Father    Heart attack Father    ADD / ADHD Son     Risk of Suicide/Violence: low patient denies any suicidal or homicidal ideation.  Impression/DX:  CAEDYN TASSINARI is a 66 year old female referred by Leeroy Cha, MD in conjunction with her PCP Marda Stalker, PA-C for neuropsychological consultation and therapeutic interventions due to residual and ongoing chronic pain issues in the setting of fibromyalgia and prior lumbar back surgery with continued chronic pain.  The patient is also been coping with cervical myofascial pain syndrome and intolerance to cold.  The patient describes significant fatigue, difficulty walking and moving and has described her muscles as feeling like "dead weight" in both her arms and legs.  The patient reports that the symptoms have been going on for at least 4 years but has become more severe since August of this year.  2 years ago she had back surgery with fusion at L4-L5 and L5-S1.  The patient has recently been tried on oxycodone for pain and has been using it for 2 months to see if it was helpful.  She is taking this twice per day.  The patient reports that she was diagnosed with fibromyalgia in 2010 and has had periodic flareups particular around times of cold and that these flareups come and go.   Current medications include baclofen, oxycodone and Wellbutrin.  Patient has tried TENS units before and there have been discussions about possible spinal cord stimulator trials but this has not been pursued at this point.  Patient with significant depressive response along with her chronic pain and cervical myofascial pain.  She has just begun using trigger point injections with her physiatrist.  Disposition/Plan:  11/05/2021: Today we continue to work on therapeutic interventions with the patient around chronic and significant pain and addressed issues related to factors that could modify both the intensity, duration and frequency of  significant pain events although she has a constant baseline level of pain.  Addressed issues related to cognitive coping skills, sleep hygiene issues as well as discussing potential therapeutic interventions that might be available.  The patient continues to have difficulty with sleep and I will address potential options with her attending physician to see what the physicians thoughts are on trying something such as a very low-dose Seroquel at night.  Diagnosis:    Fibromyalgia  Moderate episode of recurrent major depressive disorder (HCC)  Cervical myofascial pain syndrome  Cervical radiculopathy  DDD (degenerative disc disease), lumbar         Electronically Signed   _______________________ Ilean Skill, Psy.D. Clinical Neuropsychologist

## 2022-05-11 DIAGNOSIS — G8929 Other chronic pain: Secondary | ICD-10-CM | POA: Diagnosis not present

## 2022-05-11 DIAGNOSIS — M461 Sacroiliitis, not elsewhere classified: Secondary | ICD-10-CM | POA: Diagnosis not present

## 2022-05-11 DIAGNOSIS — M810 Age-related osteoporosis without current pathological fracture: Secondary | ICD-10-CM | POA: Diagnosis not present

## 2022-05-11 DIAGNOSIS — M797 Fibromyalgia: Secondary | ICD-10-CM | POA: Diagnosis not present

## 2022-05-11 DIAGNOSIS — Z6825 Body mass index (BMI) 25.0-25.9, adult: Secondary | ICD-10-CM | POA: Diagnosis not present

## 2022-05-14 DIAGNOSIS — H6993 Unspecified Eustachian tube disorder, bilateral: Secondary | ICD-10-CM | POA: Diagnosis not present

## 2022-05-14 DIAGNOSIS — R0981 Nasal congestion: Secondary | ICD-10-CM | POA: Diagnosis not present

## 2022-05-14 DIAGNOSIS — J329 Chronic sinusitis, unspecified: Secondary | ICD-10-CM | POA: Diagnosis not present

## 2022-05-18 DIAGNOSIS — M5126 Other intervertebral disc displacement, lumbar region: Secondary | ICD-10-CM | POA: Diagnosis not present

## 2022-05-18 DIAGNOSIS — M542 Cervicalgia: Secondary | ICD-10-CM | POA: Diagnosis not present

## 2022-05-18 DIAGNOSIS — M545 Low back pain, unspecified: Secondary | ICD-10-CM | POA: Diagnosis not present

## 2022-05-18 DIAGNOSIS — M6281 Muscle weakness (generalized): Secondary | ICD-10-CM | POA: Diagnosis not present

## 2022-05-18 DIAGNOSIS — M898X8 Other specified disorders of bone, other site: Secondary | ICD-10-CM | POA: Diagnosis not present

## 2022-05-18 DIAGNOSIS — M2569 Stiffness of other specified joint, not elsewhere classified: Secondary | ICD-10-CM | POA: Diagnosis not present

## 2022-05-25 ENCOUNTER — Encounter: Payer: Self-pay | Admitting: Physical Medicine and Rehabilitation

## 2022-05-25 ENCOUNTER — Encounter: Payer: Medicare HMO | Attending: Physical Medicine and Rehabilitation | Admitting: Physical Medicine and Rehabilitation

## 2022-05-25 VITALS — BP 121/70 | HR 53 | Temp 98.0°F | Ht 63.0 in | Wt 142.0 lb

## 2022-05-25 DIAGNOSIS — M7918 Myalgia, other site: Secondary | ICD-10-CM | POA: Insufficient documentation

## 2022-05-25 DIAGNOSIS — M797 Fibromyalgia: Secondary | ICD-10-CM | POA: Diagnosis not present

## 2022-05-25 MED ORDER — LIDOCAINE HCL 1 % IJ SOLN: 5.0000 mL | Freq: Once | INTRAMUSCULAR | Status: DC

## 2022-05-25 NOTE — Progress Notes (Signed)

## 2022-05-26 DIAGNOSIS — M542 Cervicalgia: Secondary | ICD-10-CM | POA: Diagnosis not present

## 2022-05-26 DIAGNOSIS — M2569 Stiffness of other specified joint, not elsewhere classified: Secondary | ICD-10-CM | POA: Diagnosis not present

## 2022-05-26 DIAGNOSIS — M545 Low back pain, unspecified: Secondary | ICD-10-CM | POA: Diagnosis not present

## 2022-05-26 DIAGNOSIS — M6281 Muscle weakness (generalized): Secondary | ICD-10-CM | POA: Diagnosis not present

## 2022-06-02 DIAGNOSIS — Z4789 Encounter for other orthopedic aftercare: Secondary | ICD-10-CM | POA: Diagnosis not present

## 2022-06-02 DIAGNOSIS — M7740 Metatarsalgia, unspecified foot: Secondary | ICD-10-CM | POA: Diagnosis not present

## 2022-06-02 DIAGNOSIS — T849XXA Unspecified complication of internal orthopedic prosthetic device, implant and graft, initial encounter: Secondary | ICD-10-CM | POA: Diagnosis not present

## 2022-06-03 DIAGNOSIS — M545 Low back pain, unspecified: Secondary | ICD-10-CM | POA: Diagnosis not present

## 2022-06-03 DIAGNOSIS — M2569 Stiffness of other specified joint, not elsewhere classified: Secondary | ICD-10-CM | POA: Diagnosis not present

## 2022-06-03 DIAGNOSIS — M542 Cervicalgia: Secondary | ICD-10-CM | POA: Diagnosis not present

## 2022-06-03 DIAGNOSIS — M6281 Muscle weakness (generalized): Secondary | ICD-10-CM | POA: Diagnosis not present

## 2022-06-03 DIAGNOSIS — M47812 Spondylosis without myelopathy or radiculopathy, cervical region: Secondary | ICD-10-CM | POA: Diagnosis not present

## 2022-06-10 DIAGNOSIS — M2569 Stiffness of other specified joint, not elsewhere classified: Secondary | ICD-10-CM | POA: Diagnosis not present

## 2022-06-10 DIAGNOSIS — M545 Low back pain, unspecified: Secondary | ICD-10-CM | POA: Diagnosis not present

## 2022-06-10 DIAGNOSIS — G4719 Other hypersomnia: Secondary | ICD-10-CM | POA: Diagnosis not present

## 2022-06-10 DIAGNOSIS — M6281 Muscle weakness (generalized): Secondary | ICD-10-CM | POA: Diagnosis not present

## 2022-06-10 DIAGNOSIS — M542 Cervicalgia: Secondary | ICD-10-CM | POA: Diagnosis not present

## 2022-06-11 NOTE — Progress Notes (Signed)
Surgery orders requested via Epic inbox. °

## 2022-06-12 ENCOUNTER — Other Ambulatory Visit (HOSPITAL_COMMUNITY): Payer: Self-pay

## 2022-06-13 DIAGNOSIS — H8309 Labyrinthitis, unspecified ear: Secondary | ICD-10-CM | POA: Diagnosis not present

## 2022-06-13 DIAGNOSIS — J0140 Acute pansinusitis, unspecified: Secondary | ICD-10-CM | POA: Diagnosis not present

## 2022-06-16 DIAGNOSIS — M542 Cervicalgia: Secondary | ICD-10-CM | POA: Diagnosis not present

## 2022-06-16 DIAGNOSIS — M6281 Muscle weakness (generalized): Secondary | ICD-10-CM | POA: Diagnosis not present

## 2022-06-16 DIAGNOSIS — M545 Low back pain, unspecified: Secondary | ICD-10-CM | POA: Diagnosis not present

## 2022-06-16 DIAGNOSIS — M2569 Stiffness of other specified joint, not elsewhere classified: Secondary | ICD-10-CM | POA: Diagnosis not present

## 2022-06-16 NOTE — H&P (Signed)
Patient's anticipated LOS is less than 2 midnights, meeting these requirements: - Younger than 56 - Lives within 1 hour of care - Has a competent adult at home to recover with post-op recover - NO history of  - Chronic pain requiring opiods  - Diabetes  - Coronary Artery Disease  - Heart failure  - Heart attack  - Stroke  - DVT/VTE  - Cardiac arrhythmia  - Respiratory Failure/COPD  - Renal failure  - Anemia  - Advanced Liver disease     Sharon Logan is an 67 y.o. female.    Chief Complaint: right shoulder pain  HPI: Pt is a 67 y.o. female complaining of right shoulder pain for multiple years. Pain had continually increased since the beginning. X-rays in the clinic show end-stage arthritic changes of the right shoulder. Pt has tried various conservative treatments which have failed to alleviate their symptoms, including injections and therapy. Various options are discussed with the patient. Risks, benefits and expectations were discussed with the patient. Patient understand the risks, benefits and expectations and wishes to proceed with surgery.   PCP:  Jolinda Croak, MD  D/C Plans: Home  PMH: Past Medical History:  Diagnosis Date   Allergic rhinitis    Anxiety    Arthritis    Cancer ([facility])    skin   Chronic pain    Chronically dry eyes    Depression    Fibromyalgia    GERD (gastroesophageal reflux disease)    Headache    Osteoporosis    Vitamin D deficiency    Wears glasses     PSH: Past Surgical History:  Procedure Laterality Date   ABDOMINAL HYSTERECTOMY     BACK SURGERY     BUNIONECTOMY Bilateral    EYE SURGERY     laser surgery   NASAL SINUS SURGERY     x2   SACROILIAC JOINT FUSION Bilateral 09/12/2020   Procedure: REMOVAL OF BILATERAL PELVIC SCREWS;  Surgeon: Phylliss Bob, MD;  Location: [facility];  Service: Orthopedics;  Laterality: Bilateral;   SKIN CANCER EXCISION     face x3   TUBAL LIGATION      Social History:  reports that she has  never smoked. She has never used smokeless tobacco. She reports that she does not drink alcohol and does not use drugs. BMI: Estimated body mass index is 25.15 kg/m as calculated from the following:   Height as of 05/25/22: '5\' 3"'$  (1.6 m).   Weight as of 05/25/22: 64.4 kg.  Lab Results  Component Value Date   ALBUMIN 3.8 04/21/2021   Diabetes: Patient does not have a diagnosis of diabetes.     Smoking Status:   reports that she has never smoked. She has never used smokeless tobacco.    Allergies:  Allergies  Allergen Reactions   Gabapentin Other (See Comments)    Delirium, stroke like symptoms     Amitriptyline Other (See Comments)   Amlodipine Other (See Comments)    headaches   Polymyxin B Other (See Comments)    Eyes go blood red    Zolpidem Rash    "sleep walk"    Cymbalta [Duloxetine Hcl] Other (See Comments)    Headaches, constipation   Fluoxetine Other (See Comments)    CONSTIPATION    Lamotrigine Rash   Other Other (See Comments)    OTOBIOTIC > RED EYES   Pregabalin Itching and Rash    Itchy red rash on chest      Medications:  Current Facility-Administered Medications  Medication Dose Route Frequency Provider Last Rate Last Admin   lidocaine (XYLOCAINE) 1 % (with pres) injection 5 mL  5 mL Intradermal Once Raulkar, Clide Deutscher, MD       Current Outpatient Medications  Medication Sig Dispense Refill   acetaminophen (TYLENOL) 325 MG tablet Take 650 mg by mouth every 6 (six) hours as needed for moderate pain.     amLODipine (NORVASC) 5 MG tablet Take 5 mg by mouth daily.     baclofen (LIORESAL) 10 MG tablet Take 10 mg by mouth 2 (two) times daily.     Carboxymethylcellulose Sodium (THERATEARS OP) Place 1 drop into both eyes 4 (four) times daily as needed (dry eyes).     cetirizine (ZYRTEC) 10 MG tablet Take 10 mg by mouth daily as needed for allergies.     diclofenac Sodium (VOLTAREN) 1 % GEL Apply 2 g topically daily as needed (pain).     DRYSOL 20 %  external solution Apply 1 application topically daily as needed (sweat).     EPINEPHrine 0.3 mg/0.3 mL IJ SOAJ injection Inject 0.3 mg into the muscle as needed for anaphylaxis.     fluticasone (FLONASE) 50 MCG/ACT nasal spray Place 2 sprays into both nostrils 2 (two) times daily as needed for allergies or rhinitis.     lidocaine (LIDODERM) 5 % Place 1 patch onto the skin daily.     magnesium 30 MG tablet Take 30 mg by mouth at bedtime.     rizatriptan (MAXALT) 10 MG tablet Take 10 mg by mouth daily as needed for migraine.      saccharomyces boulardii (FLORASTOR) 250 MG capsule Take 250 mg by mouth daily.     sulindac (CLINORIL) 150 MG tablet Take 150 mg by mouth 2 (two) times daily.     traZODone (DESYREL) 50 MG tablet Take 75 mg by mouth at bedtime.     cyclobenzaprine (FLEXERIL) 10 MG tablet Take 1 tablet (10 mg total) by mouth 2 (two) times daily as needed for muscle spasms. 12 tablet 0   nitroGLYCERIN (NITRO-DUR) 0.2 mg/hr patch Place 1 patch (0.2 mg total) onto the skin daily. (Patient not taking: Reported on 06/12/2022) 30 patch 2   XTAMPZA ER 13.5 MG C12A Take 13.5 mg by mouth every 12 (twelve) hours.      No results found for this or any previous visit (from the past 48 hour(s)). No results found.  ROS: Pain with rom of the right upper extremity  Physical Exam: Alert and oriented 67 y.o. female in no acute distress Cranial nerves 2-12 intact Cervical spine: full rom with no tenderness, nv intact distally Chest: active breath sounds bilaterally, no wheeze rhonchi or rales Heart: regular rate and rhythm, no murmur Abd: non tender non distended with active bowel sounds Hip is stable with rom  Right shoulder painful and weak rom Nv intact distally No rashes or edema distally  Assessment/Plan Assessment: right shoulder cuff arthropathy  Plan:  Patient will undergo a right reverse total shoulder by Dr. Veverly Fells at [facility] Risks benefits and expectations were discussed with the  patient. Patient understand risks, benefits and expectations and wishes to proceed. Preoperative templating of the joint replacement has been completed, documented, and submitted to the Operating Room personnel in order to optimize intra-operative equipment management.   Merla Riches PA-C, MPAS [facility] is now [facility] [address], [place], [place], [place] [address] Phone: [phone] [email or web address]  [employer]

## 2022-06-17 NOTE — Patient Instructions (Signed)
SURGICAL WAITING ROOM VISITATION  Patients having surgery or a procedure may have no more than 2 support people in the waiting area - these visitors may rotate.    Children under the age of 44 must have an adult with them who is not the patient.  Due to an increase in RSV and influenza rates and associated hospitalizations, children ages 61 and under may not visit patients in [place].  If the patient needs to stay at the hospital during part of their recovery, the visitor guidelines for inpatient rooms apply. Pre-op nurse will coordinate an appropriate time for 1 support person to accompany patient in pre-op.  This support person may not rotate.    Please refer to the [facility] website for the visitor guidelines for Inpatients (after your surgery is over and you are in a regular room).    Your procedure is scheduled on: 07/03/22   Report to [facility] Main Entrance    Report to admitting at 7:30 AM   Call this number if you have problems the morning of surgery [phone]   Do not eat food :After Midnight.   After Midnight you may have the following liquids until 7:00 AM DAY OF SURGERY  Water Non-Citrus Juices (without pulp, NO RED-Apple, White grape, White cranberry) Black Coffee (NO MILK/CREAM OR CREAMERS, sugar ok)  Clear Tea (NO MILK/CREAM OR CREAMERS, sugar ok) regular and decaf                             Plain Jell-O (NO RED)                                           Fruit ices (not with fruit pulp, NO RED)                                     Popsicles (NO RED)                                                               Sports drinks like Gatorade (NO RED)                 The day of surgery:  Drink ONE (1) Pre-Surgery Clear Ensure at 7:00 AM the morning of surgery. Drink in one sitting. Do not sip.  This drink was given to you during your hospital  pre-op appointment visit. Nothing else to drink after completing the  Pre-Surgery Clear  Ensure.          If you have questions, please contact your surgeon's office.   FOLLOW BOWEL PREP AND ANY ADDITIONAL PRE OP INSTRUCTIONS YOU RECEIVED FROM YOUR SURGEON'S OFFICE!!!     Oral Hygiene is also important to reduce your risk of infection.                                    Remember - BRUSH YOUR TEETH THE MORNING OF SURGERY WITH YOUR REGULAR TOOTHPASTE  DENTURES WILL  BE REMOVED PRIOR TO SURGERY PLEASE DO NOT APPLY "Poly grip" OR ADHESIVES!!!   Take these medicines the morning of surgery with A SIP OF WATER: Tylenol, Amlodipine, Zyrtec, Xtampza                              You may not have any metal on your body including hair pins, jewelry, and body piercing             Do not wear make-up, lotions, powders, perfumes, or deodorant  Do not wear nail polish including gel and S&S, artificial/acrylic nails, or any other type of covering on natural nails including finger and toenails. If you have artificial nails, gel coating, etc. that needs to be removed by a nail salon please have this removed prior to surgery or surgery may need to be canceled/ delayed if the surgeon/ anesthesia feels like they are unable to be safely monitored.   Do not shave  48 hours prior to surgery.    Do not bring valuables to the hospital. [place].   Contacts, glasses, dentures or bridgework may not be worn into surgery.   Bring small overnight bag day of surgery.   DO NOT [place]. PHARMACY WILL DISPENSE MEDICATIONS LISTED ON YOUR MEDICATION LIST TO YOU DURING YOUR ADMISSION [place]!   Special Instructions: Bring a copy of your healthcare power of attorney and living will documents the day of surgery if you haven't scanned them before.              Please read over the following fact sheets you were given: IF [place] [phone] Schneiders    If you  received a COVID test during your pre-op visit  it is requested that you wear a mask when out in public, stay away from anyone that may not be feeling well and notify your surgeon if you develop symptoms. If you test positive for Covid or have been in contact with anyone that has tested positive in the last 10 days please notify you surgeon.    [place] - Preparing for Surgery Before surgery, you can play an important role.  Because skin is not sterile, your skin needs to be as free of germs as possible.  You can reduce the number of germs on your skin by washing with CHG (chlorahexidine gluconate) soap before surgery.  CHG is an antiseptic cleaner which kills germs and bonds with the skin to continue killing germs even after washing. Please DO NOT use if you have an allergy to CHG or antibacterial soaps.  If your skin becomes reddened/irritated stop using the CHG and inform your nurse when you arrive at Short Stay. Do not shave (including legs and underarms) for at least 48 hours prior to the first CHG shower.  You may shave your face/neck.  Please follow these instructions carefully:  1.  Shower with CHG Soap the night before surgery and the  morning of surgery.  2.  If you choose to wash your hair, wash your hair first as usual with your normal  shampoo.  3.  After you shampoo, rinse your hair and body thoroughly to remove the shampoo.  4.  Use CHG as you would any other liquid soap.  You can apply chg directly to the skin and wash.  Gently with a scrungie or clean washcloth.  5.  Apply the CHG Soap to your body ONLY FROM THE NECK DOWN.   Do   not use on face/ open                           Wound or open sores. Avoid contact with eyes, ears mouth and   genitals (private parts).                       Wash face,  Genitals (private parts) with your normal soap.             6.  Wash thoroughly, paying special attention to the area where your    surgery  will be  performed.  7.  Thoroughly rinse your body with warm water from the neck down.  8.  DO NOT shower/wash with your normal soap after using and rinsing off the CHG Soap.                9.  Pat yourself dry with a clean towel.            10.  Wear clean pajamas.            11.  Place clean sheets on your bed the night of your first shower and do not  sleep with pets. Day of Surgery : Do not apply any lotions/deodorants the morning of surgery.  Please wear clean clothes to the hospital/surgery center.  FAILURE TO FOLLOW THESE INSTRUCTIONS MAY RESULT IN THE CANCELLATION OF YOUR SURGERY  PATIENT SIGNATURE_________________________________  NURSE SIGNATURE__________________________________  ________________________________________________________________________  Adam Phenix  An incentive spirometer is a tool that can help keep your lungs clear and active. This tool measures how well you are filling your lungs with each breath. Taking long deep breaths may help reverse or decrease the chance of developing breathing (pulmonary) problems (especially infection) following: A long period of time when you are unable to move or be active. BEFORE THE PROCEDURE  If the spirometer includes an indicator to show your best effort, your nurse or respiratory therapist will set it to a desired goal. If possible, sit up straight or lean slightly forward. Try not to slouch. Hold the incentive spirometer in an upright position. INSTRUCTIONS FOR USE  Sit on the edge of your bed if possible, or sit up as far as you can in bed or on a chair. Hold the incentive spirometer in an upright position. Breathe out normally. Place the mouthpiece in your mouth and seal your lips tightly around it. Breathe in slowly and as deeply as possible, raising the piston or the ball toward the top of the column. Hold your breath for 3-5 seconds or for as long as possible. Allow the piston or ball to fall to the bottom of the  column. Remove the mouthpiece from your mouth and breathe out normally. Rest for a few seconds and repeat Steps 1 through 7 at least 10 times every 1-2 hours when you are awake. Take your time and take a few normal breaths between deep breaths. The spirometer may include an indicator to show your best effort. Use the indicator as a goal to work toward during each repetition. After each set of 10 deep breaths, practice coughing to be sure your  lungs are clear. If you have an incision (the cut made at the time of surgery), support your incision when coughing by placing a pillow or rolled up towels firmly against it. Once you are able to get out of bed, walk around indoors and cough well. You may stop using the incentive spirometer when instructed by your caregiver.  RISKS AND COMPLICATIONS Take your time so you do not get dizzy or light-headed. If you are in pain, you may need to take or ask for pain medication before doing incentive spirometry. It is harder to take a deep breath if you are having pain. AFTER USE Rest and breathe slowly and easily. It can be helpful to keep track of a log of your progress. Your caregiver can provide you with a simple table to help with this. If you are using the spirometer at home, follow these instructions: [place] IF:  You are having difficultly using the spirometer. You have trouble using the spirometer as often as instructed. Your pain medication is not giving enough relief while using the spirometer. You develop fever of 100.5 F (38.1 C) or higher. SEEK IMMEDIATE MEDICAL CARE IF:  You cough up bloody sputum that had not been present before. You develop fever of 102 F (38.9 C) or greater. You develop worsening pain at or near the incision site. MAKE SURE YOU:  Understand these instructions. Will watch your condition. Will get help right away if you are not doing well or get worse. Document Released: 10/12/2006 Document Revised: 08/24/2011  Document Reviewed: 12/13/2006 ExitCare Patient Information 2014 [place].   ________________________________________________________________________  [facility]- Preparing for Total Shoulder Arthroplasty    Before surgery, you can play an important role. Because skin is not sterile, your skin needs to be as free of germs as possible. You can reduce the number of germs on your skin by using the following products. Benzoyl Peroxide Gel Reduces the number of germs present on the skin Applied twice a day to shoulder area starting two days before surgery    ==================================================================  Please follow these instructions carefully:  BENZOYL PEROXIDE 5% GEL  Please do not use if you have an allergy to benzoyl peroxide.   If your skin becomes reddened/irritated stop using the benzoyl peroxide.  Starting two days before surgery, apply as follows: Apply benzoyl peroxide in the morning and at night. Apply after taking a shower. If you are not taking a shower clean entire shoulder front, back, and side along with the armpit with a clean wet washcloth.  Place a quarter-sized dollop on your shoulder and rub in thoroughly, making sure to cover the front, back, and side of your shoulder, along with the armpit.   2 days before ____ AM   ____ PM              1 day before ____ AM   ____ PM                         Do this twice a day for two days.  (Last application is the night before surgery, AFTER using the CHG soap as described below).  Do NOT apply benzoyl peroxide gel on the day of surgery.

## 2022-06-17 NOTE — Progress Notes (Signed)
COVID Vaccine Completed:  Date of COVID positive in last 90 days:  PCP - Melissa Montane, MD Cardiologist -   Medical/Cardiac clearance by Melissa Montane 04/27/22 on chart/Epic  Chest x-ray -  EKG - 04/27/22 on chart Stress Test -  ECHO -  Cardiac Cath -  Pacemaker/ICD device last checked: Spinal Cord Stimulator:  Bowel Prep -   Sleep Study -  CPAP -   Fasting Blood Sugar -  Checks Blood Sugar _____ times a day  Last dose of GLP1 agonist-  N/A GLP1 instructions:  N/A   Last dose of SGLT-2 inhibitors-  N/A SGLT-2 instructions: N/A   Blood Thinner Instructions: Aspirin Instructions: Last Dose:  Activity level:  Can go up a flight of stairs and perform activities of daily living without stopping and without symptoms of chest pain or shortness of breath.  Able to exercise without symptoms  Unable to go up a flight of stairs without symptoms of     Anesthesia review:   Patient denies shortness of breath, fever, cough and chest pain at PAT appointment  Patient verbalized understanding of instructions that were given to them at the PAT appointment. Patient was also instructed that they will need to review over the PAT instructions again at home before surgery.

## 2022-06-18 ENCOUNTER — Encounter (HOSPITAL_COMMUNITY): Payer: Self-pay

## 2022-06-18 ENCOUNTER — Encounter (HOSPITAL_COMMUNITY)
Admission: RE | Admit: 2022-06-18 | Discharge: 2022-06-18 | Disposition: A | Discharge status: Home / Self Care | Payer: Medicare HMO | Source: Ambulatory Visit | Attending: Orthopedic Surgery | Admitting: Orthopedic Surgery

## 2022-06-18 VITALS — BP 127/90 | HR 64 | Temp 97.7°F | Resp 14 | Ht 63.0 in | Wt 131.0 lb

## 2022-06-18 DIAGNOSIS — I10 Essential (primary) hypertension: Secondary | ICD-10-CM

## 2022-06-18 DIAGNOSIS — Z01812 Encounter for preprocedural laboratory examination: Secondary | ICD-10-CM | POA: Diagnosis not present

## 2022-06-18 DIAGNOSIS — Z01818 Encounter for other preprocedural examination: Secondary | ICD-10-CM

## 2022-06-18 HISTORY — DX: Cerebral infarction, unspecified: I63.9

## 2022-06-18 LAB — BASIC METABOLIC PANEL
Anion gap: 9 (ref 5–15)
BUN: 15 mg/dL (ref 8–23)
CO2: 24 mmol/L (ref 22–32)
Calcium: 9 mg/dL (ref 8.9–10.3)
Chloride: 106 mmol/L (ref 98–111)
Creatinine, Ser: 0.86 mg/dL (ref 0.44–1.00)
GFR, Estimated: >60 mL/min (ref >60)
Glucose, Bld: 101 mg/dL — ABNORMAL HIGH (ref 70–99)
Potassium: 4.7 mmol/L (ref 3.5–5.1)
Sodium: 139 mmol/L (ref 135–145)

## 2022-06-18 LAB — CBC
HCT: 44.5 % (ref 36.0–46.0)
Hemoglobin: 14.1 g/dL (ref 12.0–15.0)
MCH: 29.9 pg (ref 26.0–34.0)
MCHC: 31.7 g/dL (ref 30.0–36.0)
MCV: 94.5 fL (ref 80.0–100.0)
Platelets: 294 10*3/uL (ref 150–400)
RBC: 4.71 MIL/uL (ref 3.87–5.11)
RDW: 12.8 % (ref 11.5–15.5)
WBC: 7 10*3/uL (ref 4.0–10.5)
nRBC: 0 % (ref 0.0–0.2)

## 2022-06-18 LAB — SURGICAL PCR SCREEN
MRSA, PCR: NEGATIVE
Staphylococcus aureus: NEGATIVE

## 2022-06-19 DIAGNOSIS — B349 Viral infection, unspecified: Secondary | ICD-10-CM | POA: Diagnosis not present

## 2022-06-19 DIAGNOSIS — H6593 Unspecified nonsuppurative otitis media, bilateral: Secondary | ICD-10-CM | POA: Diagnosis not present

## 2022-06-19 DIAGNOSIS — Z6824 Body mass index (BMI) 24.0-24.9, adult: Secondary | ICD-10-CM | POA: Diagnosis not present

## 2022-06-19 DIAGNOSIS — G4709 Other insomnia: Secondary | ICD-10-CM | POA: Diagnosis not present

## 2022-06-19 DIAGNOSIS — M8080XD Other osteoporosis with current pathological fracture, unspecified site, subsequent encounter for fracture with routine healing: Secondary | ICD-10-CM | POA: Diagnosis not present

## 2022-06-19 DIAGNOSIS — G8929 Other chronic pain: Secondary | ICD-10-CM | POA: Diagnosis not present

## 2022-06-19 DIAGNOSIS — M79652 Pain in left thigh: Secondary | ICD-10-CM | POA: Diagnosis not present

## 2022-06-19 DIAGNOSIS — M79651 Pain in right thigh: Secondary | ICD-10-CM | POA: Diagnosis not present

## 2022-06-19 DIAGNOSIS — M62838 Other muscle spasm: Secondary | ICD-10-CM | POA: Diagnosis not present

## 2022-06-24 DIAGNOSIS — R252 Cramp and spasm: Secondary | ICD-10-CM | POA: Diagnosis not present

## 2022-06-24 DIAGNOSIS — Z888 Allergy status to other drugs, medicaments and biological substances status: Secondary | ICD-10-CM | POA: Diagnosis not present

## 2022-06-24 DIAGNOSIS — M545 Low back pain, unspecified: Secondary | ICD-10-CM | POA: Diagnosis not present

## 2022-06-24 DIAGNOSIS — M542 Cervicalgia: Secondary | ICD-10-CM | POA: Diagnosis not present

## 2022-06-24 DIAGNOSIS — G8929 Other chronic pain: Secondary | ICD-10-CM | POA: Diagnosis not present

## 2022-06-24 DIAGNOSIS — Z885 Allergy status to narcotic agent status: Secondary | ICD-10-CM | POA: Diagnosis not present

## 2022-06-25 DIAGNOSIS — M47812 Spondylosis without myelopathy or radiculopathy, cervical region: Secondary | ICD-10-CM | POA: Diagnosis not present

## 2022-06-29 DIAGNOSIS — M533 Sacrococcygeal disorders, not elsewhere classified: Secondary | ICD-10-CM | POA: Diagnosis not present

## 2022-06-29 DIAGNOSIS — M5136 Other intervertebral disc degeneration, lumbar region: Secondary | ICD-10-CM | POA: Diagnosis not present

## 2022-06-29 DIAGNOSIS — M546 Pain in thoracic spine: Secondary | ICD-10-CM | POA: Diagnosis not present

## 2022-06-29 DIAGNOSIS — M47812 Spondylosis without myelopathy or radiculopathy, cervical region: Secondary | ICD-10-CM | POA: Diagnosis not present

## 2022-06-29 DIAGNOSIS — M25551 Pain in right hip: Secondary | ICD-10-CM | POA: Diagnosis not present

## 2022-06-29 DIAGNOSIS — M961 Postlaminectomy syndrome, not elsewhere classified: Secondary | ICD-10-CM | POA: Diagnosis not present

## 2022-06-29 DIAGNOSIS — M542 Cervicalgia: Secondary | ICD-10-CM | POA: Diagnosis not present

## 2022-06-29 DIAGNOSIS — I7300 Raynaud's syndrome without gangrene: Secondary | ICD-10-CM | POA: Diagnosis not present

## 2022-06-29 DIAGNOSIS — L749 Eccrine sweat disorder, unspecified: Secondary | ICD-10-CM | POA: Diagnosis not present

## 2022-06-30 DIAGNOSIS — M6281 Muscle weakness (generalized): Secondary | ICD-10-CM | POA: Diagnosis not present

## 2022-06-30 DIAGNOSIS — M542 Cervicalgia: Secondary | ICD-10-CM | POA: Diagnosis not present

## 2022-06-30 DIAGNOSIS — M545 Low back pain, unspecified: Secondary | ICD-10-CM | POA: Diagnosis not present

## 2022-06-30 DIAGNOSIS — M2569 Stiffness of other specified joint, not elsewhere classified: Secondary | ICD-10-CM | POA: Diagnosis not present

## 2022-07-01 DIAGNOSIS — R61 Generalized hyperhidrosis: Secondary | ICD-10-CM | POA: Diagnosis not present

## 2022-07-02 DIAGNOSIS — R202 Paresthesia of skin: Secondary | ICD-10-CM | POA: Diagnosis not present

## 2022-07-02 DIAGNOSIS — M5413 Radiculopathy, cervicothoracic region: Secondary | ICD-10-CM | POA: Diagnosis not present

## 2022-07-02 DIAGNOSIS — R200 Anesthesia of skin: Secondary | ICD-10-CM | POA: Diagnosis not present

## 2022-07-02 NOTE — Anesthesia Preprocedure Evaluation (Addendum)
Anesthesia Evaluation  Patient identified by MRN, date of birth, ID band Patient awake    Reviewed: Allergy & Precautions, H&P , NPO status , Patient's Chart, lab work & pertinent test results  Airway Mallampati: II  TM Distance: >3 FB Neck ROM: Full    Dental no notable dental hx. (+) Teeth Intact, Dental Advisory Given   Pulmonary neg pulmonary ROS   Pulmonary exam normal breath sounds clear to auscultation       Cardiovascular Exercise Tolerance: Good negative cardio ROS  Rhythm:Regular Rate:Normal     Neuro/Psych  Headaches  Anxiety Depression       GI/Hepatic Neg liver ROS,GERD  ,,  Endo/Other  negative endocrine ROS    Renal/GU negative Renal ROS  negative genitourinary   Musculoskeletal  (+) Arthritis ,  Fibromyalgia -  Abdominal   Peds  Hematology negative hematology ROS (+)   Anesthesia Other Findings   Reproductive/Obstetrics negative OB ROS                             Anesthesia Physical Anesthesia Plan  ASA: 2  Anesthesia Plan: General   Post-op Pain Management: Regional block* and Tylenol PO (pre-op)*   Induction: Intravenous  PONV Risk Score and Plan: 3 and Ondansetron, Dexamethasone and Midazolam  Airway Management Planned: Oral ETT  Additional Equipment:   Intra-op Plan:   Post-operative Plan: Extubation in OR  Informed Consent: I have reviewed the patients History and Physical, chart, labs and discussed the procedure including the risks, benefits and alternatives for the proposed anesthesia with the patient or authorized representative who has indicated his/her understanding and acceptance.     Dental advisory given  Plan Discussed with: CRNA  Anesthesia Plan Comments:        Anesthesia Quick Evaluation

## 2022-07-03 ENCOUNTER — Ambulatory Visit (HOSPITAL_COMMUNITY): Payer: No Typology Code available for payment source | Admitting: Anesthesiology

## 2022-07-03 ENCOUNTER — Other Ambulatory Visit: Payer: Self-pay

## 2022-07-03 ENCOUNTER — Observation Stay (HOSPITAL_COMMUNITY)
Admission: RE | Admit: 2022-07-03 | Discharge: 2022-07-04 | Disposition: A | Discharge status: Home / Self Care | Payer: No Typology Code available for payment source | Source: Ambulatory Visit | Attending: Orthopedic Surgery | Admitting: Orthopedic Surgery

## 2022-07-03 ENCOUNTER — Encounter (HOSPITAL_COMMUNITY): Payer: Self-pay | Admitting: Orthopedic Surgery

## 2022-07-03 ENCOUNTER — Ambulatory Visit (HOSPITAL_COMMUNITY): Payer: No Typology Code available for payment source

## 2022-07-03 ENCOUNTER — Encounter (HOSPITAL_COMMUNITY)
Admission: RE | Disposition: A | Discharge status: Home / Self Care | Payer: Self-pay | Source: Ambulatory Visit | Attending: Orthopedic Surgery

## 2022-07-03 DIAGNOSIS — Z96611 Presence of right artificial shoulder joint: Secondary | ICD-10-CM

## 2022-07-03 DIAGNOSIS — Z8673 Personal history of transient ischemic attack (TIA), and cerebral infarction without residual deficits: Secondary | ICD-10-CM | POA: Insufficient documentation

## 2022-07-03 DIAGNOSIS — M19011 Primary osteoarthritis, right shoulder: Principal | ICD-10-CM | POA: Insufficient documentation

## 2022-07-03 DIAGNOSIS — Z85828 Personal history of other malignant neoplasm of skin: Secondary | ICD-10-CM | POA: Diagnosis not present

## 2022-07-03 DIAGNOSIS — Z79899 Other long term (current) drug therapy: Secondary | ICD-10-CM | POA: Diagnosis not present

## 2022-07-03 HISTORY — PX: REVERSE SHOULDER ARTHROPLASTY: SHX5054

## 2022-07-03 SURGERY — ARTHROPLASTY, SHOULDER, TOTAL, REVERSE
Anesthesia: General | Site: Shoulder | Laterality: Right

## 2022-07-03 MED ORDER — OXYCODONE HCL 5 MG PO TABS: 10.0000 mg | ORAL_TABLET | ORAL | Status: DC | PRN

## 2022-07-03 MED ORDER — AMLODIPINE BESYLATE 5 MG PO TABS
5.0000 mg | ORAL_TABLET | Freq: Every day | ORAL | Status: DC
  Filled 2022-07-03: qty 1

## 2022-07-03 MED ORDER — SUMATRIPTAN SUCCINATE 50 MG PO TABS: 50.0000 mg | ORAL_TABLET | ORAL | Status: DC | PRN

## 2022-07-03 MED ORDER — STERILE WATER FOR IRRIGATION IR SOLN
Status: DC | PRN
  Administered 2022-07-03: 2000 mL

## 2022-07-03 MED ORDER — POLYETHYLENE GLYCOL 3350 17 G PO PACK
17.0000 g | PACK | Freq: Every day | ORAL | Status: DC | PRN
  Filled 2022-07-03: qty 1

## 2022-07-03 MED ORDER — EPINEPHRINE PF 1 MG/ML IJ SOLN
INTRAMUSCULAR | Status: AC
  Filled 2022-07-03: qty 1

## 2022-07-03 MED ORDER — ONDANSETRON HCL 4 MG/2ML IJ SOLN: 4.0000 mg | Freq: Four times a day (QID) | INTRAMUSCULAR | Status: DC | PRN

## 2022-07-03 MED ORDER — LACTATED RINGERS IV SOLN: INTRAVENOUS | Status: DC

## 2022-07-03 MED ORDER — SUCCINYLCHOLINE CHLORIDE 200 MG/10ML IV SOSY
PREFILLED_SYRINGE | INTRAVENOUS | Status: DC | PRN
  Administered 2022-07-03: 80 mg via INTRAVENOUS

## 2022-07-03 MED ORDER — FLUTICASONE PROPIONATE 50 MCG/ACT NA SUSP: 2.0000 | Freq: Two times a day (BID) | NASAL | Status: DC | PRN

## 2022-07-03 MED ORDER — CYCLOBENZAPRINE HCL 10 MG PO TABS: 10.0000 mg | ORAL_TABLET | Freq: Two times a day (BID) | ORAL | Status: DC | PRN

## 2022-07-03 MED ORDER — PHENYLEPHRINE HCL (PRESSORS) 10 MG/ML IV SOLN
INTRAVENOUS | Status: AC
  Filled 2022-07-03: qty 1

## 2022-07-03 MED ORDER — HYDROMORPHONE HCL 1 MG/ML IJ SOLN: 0.2500 mg | INTRAMUSCULAR | Status: DC | PRN

## 2022-07-03 MED ORDER — PHENOL 1.4 % MT LIQD: 1.0000 | OROMUCOSAL | Status: DC | PRN

## 2022-07-03 MED ORDER — ORAL CARE MOUTH RINSE: 15.0000 mL | OROMUCOSAL | Status: DC | PRN

## 2022-07-03 MED ORDER — LORATADINE 10 MG PO TABS
10.0000 mg | ORAL_TABLET | Freq: Every day | ORAL | Status: DC
  Administered 2022-07-04: 10 mg via ORAL
  Filled 2022-07-03: qty 1

## 2022-07-03 MED ORDER — CARBOXYMETHYLCELLULOSE SODIUM 0.25 % OP SOLN: Freq: Four times a day (QID) | OPHTHALMIC | Status: DC | PRN

## 2022-07-03 MED ORDER — CEFAZOLIN SODIUM-DEXTROSE 2-4 GM/100ML-% IV SOLN
2.0000 g | INTRAVENOUS | Status: AC
  Administered 2022-07-03: 2 g via INTRAVENOUS
  Filled 2022-07-03: qty 100

## 2022-07-03 MED ORDER — TRANEXAMIC ACID-NACL 1000-0.7 MG/100ML-% IV SOLN
INTRAVENOUS | Status: AC
  Filled 2022-07-03: qty 100

## 2022-07-03 MED ORDER — FENTANYL CITRATE PF 50 MCG/ML IJ SOSY
50.0000 ug | PREFILLED_SYRINGE | INTRAMUSCULAR | Status: DC
  Administered 2022-07-03: 50 ug via INTRAVENOUS
  Filled 2022-07-03: qty 2

## 2022-07-03 MED ORDER — METHOCARBAMOL 500 MG PO TABS: 500.0000 mg | ORAL_TABLET | Freq: Four times a day (QID) | ORAL | Status: DC | PRN

## 2022-07-03 MED ORDER — SODIUM CHLORIDE 0.9 % IR SOLN
Status: DC | PRN
  Administered 2022-07-03: 1000 mL

## 2022-07-03 MED ORDER — FENTANYL CITRATE (PF) 100 MCG/2ML IJ SOLN
INTRAMUSCULAR | Status: AC
  Filled 2022-07-03: qty 2

## 2022-07-03 MED ORDER — TRANEXAMIC ACID-NACL 1000-0.7 MG/100ML-% IV SOLN
1000.0000 mg | Freq: Once | INTRAVENOUS | Status: AC
  Administered 2022-07-03: 1000 mg via INTRAVENOUS
  Filled 2022-07-03: qty 100

## 2022-07-03 MED ORDER — MAGNESIUM 30 MG PO TABS: 30.0000 mg | ORAL_TABLET | Freq: Every day | ORAL | Status: DC

## 2022-07-03 MED ORDER — ACETAMINOPHEN 500 MG PO TABS
1000.0000 mg | ORAL_TABLET | Freq: Once | ORAL | Status: DC
  Filled 2022-07-03: qty 2

## 2022-07-03 MED ORDER — CHLORHEXIDINE GLUCONATE 0.12 % MT SOLN
15.0000 mL | Freq: Once | OROMUCOSAL | Status: AC
  Administered 2022-07-03: 15 mL via OROMUCOSAL

## 2022-07-03 MED ORDER — HYDROCODONE-ACETAMINOPHEN 5-325 MG PO TABS: 1.0000 | ORAL_TABLET | Freq: Four times a day (QID) | ORAL | 0 refills | Status: DC | PRN

## 2022-07-03 MED ORDER — BUPIVACAINE LIPOSOME 1.3 % IJ SUSP
INTRAMUSCULAR | Status: DC | PRN
  Administered 2022-07-03: 10 mL via PERINEURAL

## 2022-07-03 MED ORDER — LIDOCAINE 2% (20 MG/ML) 5 ML SYRINGE
INTRAMUSCULAR | Status: DC | PRN
  Administered 2022-07-03: 60 mg via INTRAVENOUS

## 2022-07-03 MED ORDER — ORAL CARE MOUTH RINSE: 15.0000 mL | Freq: Once | OROMUCOSAL | Status: AC

## 2022-07-03 MED ORDER — SODIUM CHLORIDE 0.9 % IV SOLN: INTRAVENOUS | Status: DC

## 2022-07-03 MED ORDER — MENTHOL 3 MG MT LOZG: 1.0000 | LOZENGE | OROMUCOSAL | Status: DC | PRN

## 2022-07-03 MED ORDER — NITROGLYCERIN 0.2 MG/HR TD PT24: 0.2000 mg | MEDICATED_PATCH | Freq: Every day | TRANSDERMAL | Status: DC

## 2022-07-03 MED ORDER — METHOCARBAMOL 500 MG IVPB - SIMPLE MED: 500.0000 mg | Freq: Four times a day (QID) | INTRAVENOUS | Status: DC | PRN

## 2022-07-03 MED ORDER — BUPIVACAINE HCL (PF) 0.25 % IJ SOLN
INTRAMUSCULAR | Status: AC
  Filled 2022-07-03: qty 30

## 2022-07-03 MED ORDER — BACLOFEN 10 MG PO TABS
10.0000 mg | ORAL_TABLET | Freq: Two times a day (BID) | ORAL | Status: DC
  Administered 2022-07-03 – 2022-07-04 (×2): 10 mg via ORAL
  Filled 2022-07-03 (×2): qty 1

## 2022-07-03 MED ORDER — ACETAMINOPHEN 325 MG PO TABS: 650.0000 mg | ORAL_TABLET | Freq: Four times a day (QID) | ORAL | Status: DC | PRN

## 2022-07-03 MED ORDER — MIDAZOLAM HCL 2 MG/2ML IJ SOLN
INTRAMUSCULAR | Status: AC
  Filled 2022-07-03: qty 2

## 2022-07-03 MED ORDER — POLYVINYL ALCOHOL 1.4 % OP SOLN: 1.0000 [drp] | OPHTHALMIC | Status: DC | PRN

## 2022-07-03 MED ORDER — TRAZODONE HCL 50 MG PO TABS
75.0000 mg | ORAL_TABLET | Freq: Every day | ORAL | Status: DC
  Administered 2022-07-03: 75 mg via ORAL
  Filled 2022-07-03: qty 1

## 2022-07-03 MED ORDER — METHOCARBAMOL 500 MG PO TABS: 500.0000 mg | ORAL_TABLET | Freq: Three times a day (TID) | ORAL | 1 refills | Status: DC | PRN

## 2022-07-03 MED ORDER — FENTANYL CITRATE (PF) 100 MCG/2ML IJ SOLN
INTRAMUSCULAR | Status: DC | PRN
  Administered 2022-07-03 (×2): 50 ug via INTRAVENOUS

## 2022-07-03 MED ORDER — SACCHAROMYCES BOULARDII 250 MG PO CAPS
250.0000 mg | ORAL_CAPSULE | Freq: Every day | ORAL | Status: DC
  Administered 2022-07-04: 250 mg via ORAL
  Filled 2022-07-03: qty 1

## 2022-07-03 MED ORDER — EPHEDRINE SULFATE-NACL 50-0.9 MG/10ML-% IV SOSY
PREFILLED_SYRINGE | INTRAVENOUS | Status: DC | PRN
  Administered 2022-07-03: 5 mg via INTRAVENOUS
  Administered 2022-07-03: 10 mg via INTRAVENOUS

## 2022-07-03 MED ORDER — DICLOFENAC SODIUM 1 % EX GEL: 2.0000 g | Freq: Every day | CUTANEOUS | Status: DC | PRN

## 2022-07-03 MED ORDER — ACETAMINOPHEN 325 MG PO TABS
325.0000 mg | ORAL_TABLET | Freq: Four times a day (QID) | ORAL | Status: DC | PRN
  Administered 2022-07-04: 650 mg via ORAL
  Filled 2022-07-03: qty 2

## 2022-07-03 MED ORDER — TRANEXAMIC ACID-NACL 1000-0.7 MG/100ML-% IV SOLN
INTRAVENOUS | Status: DC | PRN
  Administered 2022-07-03: 1000 mg via INTRAVENOUS

## 2022-07-03 MED ORDER — PHENYLEPHRINE HCL-NACL 20-0.9 MG/250ML-% IV SOLN
INTRAVENOUS | Status: DC | PRN
  Administered 2022-07-03: 50 ug/min via INTRAVENOUS

## 2022-07-03 MED ORDER — MIDAZOLAM HCL 2 MG/2ML IJ SOLN
1.0000 mg | INTRAMUSCULAR | Status: DC
  Administered 2022-07-03: 1 mg via INTRAVENOUS
  Filled 2022-07-03: qty 2

## 2022-07-03 MED ORDER — ONDANSETRON HCL 4 MG/2ML IJ SOLN
INTRAMUSCULAR | Status: DC | PRN
  Administered 2022-07-03: 4 mg via INTRAVENOUS

## 2022-07-03 MED ORDER — LIDOCAINE 5 % EX PTCH
1.0000 | MEDICATED_PATCH | Freq: Every day | CUTANEOUS | Status: DC
  Filled 2022-07-03: qty 1

## 2022-07-03 MED ORDER — METOCLOPRAMIDE HCL 5 MG PO TABS: 5.0000 mg | ORAL_TABLET | Freq: Three times a day (TID) | ORAL | Status: DC | PRN

## 2022-07-03 MED ORDER — HYDROMORPHONE HCL 1 MG/ML IJ SOLN: 0.5000 mg | INTRAMUSCULAR | Status: DC | PRN

## 2022-07-03 MED ORDER — SULINDAC 150 MG PO TABS
150.0000 mg | ORAL_TABLET | Freq: Two times a day (BID) | ORAL | Status: DC
  Administered 2022-07-03 – 2022-07-04 (×2): 150 mg via ORAL
  Filled 2022-07-03 (×2): qty 1

## 2022-07-03 MED ORDER — BISACODYL 10 MG RE SUPP: 10.0000 mg | Freq: Every day | RECTAL | Status: DC | PRN

## 2022-07-03 MED ORDER — ONDANSETRON HCL 4 MG PO TABS: 4.0000 mg | ORAL_TABLET | Freq: Four times a day (QID) | ORAL | Status: DC | PRN

## 2022-07-03 MED ORDER — DEXAMETHASONE SODIUM PHOSPHATE 10 MG/ML IJ SOLN
INTRAMUSCULAR | Status: DC | PRN
  Administered 2022-07-03: 5 mg via INTRAVENOUS

## 2022-07-03 MED ORDER — METOCLOPRAMIDE HCL 5 MG/ML IJ SOLN: 5.0000 mg | Freq: Three times a day (TID) | INTRAMUSCULAR | Status: DC | PRN

## 2022-07-03 MED ORDER — OXYCODONE HCL ER 10 MG PO T12A
10.0000 mg | EXTENDED_RELEASE_TABLET | Freq: Two times a day (BID) | ORAL | Status: DC
  Administered 2022-07-03 – 2022-07-04 (×2): 10 mg via ORAL
  Filled 2022-07-03 (×2): qty 1

## 2022-07-03 MED ORDER — CEFAZOLIN SODIUM-DEXTROSE 2-4 GM/100ML-% IV SOLN
2.0000 g | Freq: Four times a day (QID) | INTRAVENOUS | Status: AC
  Administered 2022-07-03 – 2022-07-04 (×3): 2 g via INTRAVENOUS
  Filled 2022-07-03 (×3): qty 100

## 2022-07-03 MED ORDER — DOCUSATE SODIUM 100 MG PO CAPS
100.0000 mg | ORAL_CAPSULE | Freq: Two times a day (BID) | ORAL | Status: DC
  Administered 2022-07-03: 100 mg via ORAL
  Filled 2022-07-03 (×2): qty 1

## 2022-07-03 MED ORDER — LIDOCAINE 5 % EX PTCH
1.0000 | MEDICATED_PATCH | Freq: Every day | CUTANEOUS | Status: DC
  Administered 2022-07-03: 1 via TRANSDERMAL
  Filled 2022-07-03: qty 1

## 2022-07-03 MED ORDER — PROPOFOL 10 MG/ML IV BOLUS
INTRAVENOUS | Status: DC | PRN
  Administered 2022-07-03: 100 mg via INTRAVENOUS

## 2022-07-03 MED ORDER — EPINEPHRINE 0.3 MG/0.3ML IJ SOAJ: 0.3000 mg | INTRAMUSCULAR | Status: DC | PRN

## 2022-07-03 MED ORDER — OXYCODONE HCL 5 MG PO TABS: 5.0000 mg | ORAL_TABLET | ORAL | Status: DC | PRN

## 2022-07-03 MED ORDER — BUPIVACAINE-EPINEPHRINE (PF) 0.25% -1:200000 IJ SOLN
INTRAMUSCULAR | Status: DC | PRN
  Administered 2022-07-03: 30 mL

## 2022-07-03 MED ORDER — BUPIVACAINE-EPINEPHRINE (PF) 0.5% -1:200000 IJ SOLN
INTRAMUSCULAR | Status: DC | PRN
  Administered 2022-07-03: 15 mL via PERINEURAL

## 2022-07-03 MED ORDER — ALUMINUM CHLORIDE 20 % EX SOLN: 1.0000 "application " | Freq: Every day | CUTANEOUS | Status: DC | PRN

## 2022-07-03 SURGICAL SUPPLY — 71 items
BAG COUNTER SPONGE SURGICOUNT (BAG) IMPLANT
BAG ZIPLOCK 12X15 (MISCELLANEOUS) IMPLANT
BIT DRILL 1.6MX128 (BIT) IMPLANT
BIT DRILL 170X2.5X (BIT) IMPLANT
BIT DRL 170X2.5X (BIT) ×1
BLADE SAG 18X100X1.27 (BLADE) ×1 IMPLANT
COVER BACK TABLE 60X90IN (DRAPES) ×1 IMPLANT
COVER SURGICAL LIGHT HANDLE (MISCELLANEOUS) ×1 IMPLANT
DRAPE INCISE IOBAN 66X45 STRL (DRAPES) ×1 IMPLANT
DRAPE ORTHO SPLIT 77X108 STRL (DRAPES) ×2
DRAPE SHEET LG 3/4 BI-LAMINATE (DRAPES) ×1 IMPLANT
DRAPE SURG ORHT 6 SPLT 77X108 (DRAPES) ×2 IMPLANT
DRAPE TOP 10253 STERILE (DRAPES) ×1 IMPLANT
DRAPE U-SHAPE 47X51 STRL (DRAPES) ×1 IMPLANT
DRILL 2.5 (BIT) ×1
DRSG ADAPTIC 3X8 NADH LF (GAUZE/BANDAGES/DRESSINGS) ×1 IMPLANT
DRSG EMULSION OIL 3X16 NADH (GAUZE/BANDAGES/DRESSINGS) IMPLANT
DURAPREP 26ML APPLICATOR (WOUND CARE) ×1 IMPLANT
ECCENTRIC EPIPHYSI MODULAR SZ1 (Trauma) IMPLANT
ELECT BLADE TIP CTD 4 INCH (ELECTRODE) ×1 IMPLANT
ELECT NDL TIP 2.8 STRL (NEEDLE) ×1 IMPLANT
ELECT NEEDLE TIP 2.8 STRL (NEEDLE) ×1 IMPLANT
ELECT REM PT RETURN 15FT ADLT (MISCELLANEOUS) ×1 IMPLANT
FACESHIELD WRAPAROUND (MASK) ×1 IMPLANT
FACESHIELD WRAPAROUND OR TEAM (MASK) ×1 IMPLANT
GAUZE PAD ABD 8X10 STRL (GAUZE/BANDAGES/DRESSINGS) ×1 IMPLANT
GAUZE SPONGE 4X4 12PLY STRL (GAUZE/BANDAGES/DRESSINGS) ×1 IMPLANT
GLENOSPHERE DELTA XTEND LAT 38 (Miscellaneous) IMPLANT
GLOVE BIOGEL PI IND STRL 7.5 (GLOVE) ×1 IMPLANT
GLOVE BIOGEL PI IND STRL 8.5 (GLOVE) ×1 IMPLANT
GLOVE ORTHO TXT STRL SZ7.5 (GLOVE) ×1 IMPLANT
GLOVE SURG ORTHO 8.5 STRL (GLOVE) ×1 IMPLANT
GOWN STRL REUS W/ TWL XL LVL3 (GOWN DISPOSABLE) ×2 IMPLANT
GOWN STRL REUS W/TWL XL LVL3 (GOWN DISPOSABLE) ×2
KIT BASIN OR (CUSTOM PROCEDURE TRAY) ×1 IMPLANT
KIT TURNOVER KIT A (KITS) IMPLANT
MANIFOLD NEPTUNE II (INSTRUMENTS) ×1 IMPLANT
METAGLENE DELTA EXTEND (Trauma) IMPLANT
METAGLENE DXTEND (Trauma) ×1 IMPLANT
MODULAR ECCENTRIC EPIPHYSI SZ1 (Trauma) ×1 IMPLANT
NDL MAYO CATGUT SZ4 TPR NDL (NEEDLE) ×1 IMPLANT
NEEDLE MAYO CATGUT SZ4 (NEEDLE) ×1 IMPLANT
NS IRRIG 1000ML POUR BTL (IV SOLUTION) ×1 IMPLANT
PACK SHOULDER (CUSTOM PROCEDURE TRAY) ×1 IMPLANT
PADDING CAST ABS COTTON 4X4 ST (CAST SUPPLIES) IMPLANT
PIN GUIDE 1.2 (PIN) IMPLANT
PIN GUIDE GLENOPHERE 1.5MX300M (PIN) IMPLANT
PIN METAGLENE 2.5 (PIN) IMPLANT
PROTECTOR NERVE ULNAR (MISCELLANEOUS) ×1 IMPLANT
RESTRAINT HEAD UNIVERSAL NS (MISCELLANEOUS) ×1 IMPLANT
SCREW 4.5X18MM (Screw) ×1 IMPLANT
SCREW BN 18X4.5XSTRL SHLDR (Screw) IMPLANT
SCREW LOCK DELTA XTEND 4.5X30 (Screw) IMPLANT
SLING ARM FOAM STRAP LRG (SOFTGOODS) IMPLANT
SMARTMIX MINI TOWER (MISCELLANEOUS)
SPACER 38 PLUS 3 (Spacer) IMPLANT
SPIKE FLUID TRANSFER (MISCELLANEOUS) ×1 IMPLANT
SPONGE T-LAP 4X18 ~~LOC~~+RFID (SPONGE) ×1 IMPLANT
STEM 12 HA (Stem) IMPLANT
STRIP CLOSURE SKIN 1/2X4 (GAUZE/BANDAGES/DRESSINGS) ×1 IMPLANT
SUCTION FRAZIER HANDLE 10FR (MISCELLANEOUS) ×1
SUCTION TUBE FRAZIER 10FR DISP (MISCELLANEOUS) ×1 IMPLANT
SUT FIBERWIRE #2 38 T-5 BLUE (SUTURE) ×2
SUT MNCRL AB 4-0 PS2 18 (SUTURE) ×1 IMPLANT
SUT VIC AB 0 CT1 36 (SUTURE) ×2 IMPLANT
SUT VIC AB 0 CT2 27 (SUTURE) ×1 IMPLANT
SUT VIC AB 2-0 CT1 27 (SUTURE) ×1
SUT VIC AB 2-0 CT1 TAPERPNT 27 (SUTURE) ×1 IMPLANT
SUTURE FIBERWR #2 38 T-5 BLUE (SUTURE) ×2 IMPLANT
TOWEL OR 17X26 10 PK STRL BLUE (TOWEL DISPOSABLE) ×1 IMPLANT
TOWER SMARTMIX MINI (MISCELLANEOUS) IMPLANT

## 2022-07-03 NOTE — Brief Op Note (Signed)
07/03/2022  10:31 AM  PATIENT:  Sharon Logan  67 y.o. female  PRE-OPERATIVE DIAGNOSIS:  right shoulder osteoarthritis, end stage  POST-OPERATIVE DIAGNOSIS:  right shoulder osteoarthritis, end stage  PROCEDURE:  Procedure(s) with comments: REVERSE SHOULDER ARTHROPLASTY (Right) - 120 min choice with interscalene blockDePuy Delta xtend  NO subscap repair  SURGEON:  Surgeon(s) and Role:    Netta Cedars, MD - Primary  PHYSICIAN ASSISTANT:   ASSISTANTS: [place]ura Bruns, PA-C   ANESTHESIA:   regional plus general  EBL:  150 mL   BLOOD ADMINISTERED:none  DRAINS: none   LOCAL MEDICATIONS USED:  MARCAINE     SPECIMEN:  No Specimen  DISPOSITION OF SPECIMEN:  N/A  COUNTS:  YES  TOURNIQUET:  * No tourniquets in log *  DICTATION: .Other Dictation: Dictation Number [id number]  PLAN OF CARE: Admit for overnight observation  PATIENT DISPOSITION:  PACU - hemodynamically stable.   Delay start of Pharmacological VTE agent (>24hrs) due to surgical blood loss or risk of bleeding: not applicable

## 2022-07-03 NOTE — Discharge Instructions (Signed)
Ice to the shoulder constantly.  Keep the incision covered and clean and dry for one week, then ok to get it wet in the shower.  Do exercise as instructed several times per day.  DO NOT reach behind your back or push up out of a chair with the operative arm.  Use a sling while you are up and around for comfort, may remove while seated.  Keep pillow propped behind the operative elbow.  Follow up with Dr Veverly Fells in two weeks in the office, call [phone] for appt  Call Dr Veverly Fells at [phone] with any questions or concerns

## 2022-07-03 NOTE — Anesthesia Procedure Notes (Signed)
Date/Time: 07/03/2022 10:34 AM  Performed by: Cynda Familia, CRNAOxygen Delivery Method: Simple face mask Placement Confirmation: positive ETCO2 and breath sounds checked- equal and bilateral Dental Injury: Teeth and Oropharynx as per pre-operative assessment

## 2022-07-03 NOTE — Interval H&P Note (Signed)
History and Physical Interval Note:  07/03/2022 7:08 AM  Sharon Logan  has presented today for surgery, with the diagnosis of right shoulder osteoarthritis.  The various methods of treatment have been discussed with the patient and family. After consideration of risks, benefits and other options for treatment, the patient has consented to  Procedure(s) with comments: REVERSE SHOULDER ARTHROPLASTY (Right) - 120 min choice with interscalene block as a surgical intervention.  The patient's history has been reviewed, patient examined, no change in status, stable for surgery.  I have reviewed the patient's chart and labs.  Questions were answered to the patient's satisfaction.     [name]

## 2022-07-03 NOTE — Transfer of Care (Signed)
Immediate Anesthesia Transfer of Care Note  Patient: Carmin Richmond  Procedure(s) Performed: REVERSE SHOULDER ARTHROPLASTY (Right: Shoulder)  Patient Location: PACU  Anesthesia Type:General  Level of Consciousness: sedated  Airway & Oxygen Therapy: Patient Spontanous Breathing and Patient connected to face mask oxygen  Post-op Assessment: Report given to RN and Post -op Vital signs reviewed and stable  Post vital signs: Reviewed and stable  Last Vitals:  Vitals Value Taken Time  BP 144/67 07/03/22 1039  Temp    Pulse 66 07/03/22 1040  Resp 15 07/03/22 1040  SpO2 100 % 07/03/22 1040  Vitals shown include unvalidated device data.  Last Pain:  Vitals:   07/03/22 0806  TempSrc:   PainSc: 0-No pain         Complications: No notable events documented.

## 2022-07-03 NOTE — Op Note (Signed)
Sharon Logan, PLOCHER MEDICAL RECORD NO: [id number] ACCOUNT NO: [id number] DATE OF BIRTH: 08/25/1955 FACILITY: [facility] LOCATION: [facility] PHYSICIAN: Doran Heater. Veverly Fells, MD  Operative Report   DATE OF PROCEDURE: 07/03/2022  PREOPERATIVE DIAGNOSIS:  Right shoulder end-stage arthritis.  POSTOPERATIVE DIAGNOSIS:  Right shoulder end-stage arthritis.  PROCEDURE PERFORMED:  Right reverse shoulder replacement using DePuy Delta Xtend prosthesis with no subscap repair.  ATTENDING SURGEON:  Doran Heater. Veverly Fells, MD  ASSISTANT:  Charletta Cousin [place], [place], who was scrubbed during the entire procedure, and necessary for satisfactory completion of surgery.  ANESTHESIA:  General anesthesia was used plus interscalene block.  ESTIMATED BLOOD LOSS:  150 mL.  FLUID REPLACEMENT:  1500 mL crystalloid.  COUNTS:  Instrument counts correct.  COMPLICATIONS:  No complications.  ANTIBIOTICS:  Perioperative antibiotics were given.  INDICATIONS:  The patient is a 67 year old female with worsening right shoulder pain and dysfunction secondary to end-stage arthritis.  She has bone-on-bone on x-ray and has had progressive pain despite conservative management, presents for operative  treatment to eliminate pain and restore function.  Informed consent obtained.  DESCRIPTION OF PROCEDURE:  After an adequate level of anesthesia was achieved, the patient was positioned in modified beach chair position.  Right shoulder correctly identified and sterilely prepped and draped in the usual manner.  Timeout called,  verifying correct patient, correct site, we entered the patient's shoulder using standard deltopectoral approach, starting at the coracoid process and extending down the anterior humerus.  Dissection down through subcutaneous tissues using Bovie.   Cephalic vein was identified and taken laterally with the deltoid, pectoralis taken medially.  Conjoined tendon identified and retracted medially.  Deep retractors were placed.   Biceps was tenodesed in situ with 0 Vicryl figure-of-eight suture.  We then  released the subscapularis off the lesser tuberosity and tagged for protection of the axillary nerve.  We also released the inferior capsule, a little bit of the rotator cuff that was remaining.  We extended the shoulder delivering the humeral head out  of the wound.  We entered the proximal humerus with a 6 mm reamer.  There was no cartilage in the shoulder it was completely worn out.  We went up to a size 12 final reaming.  We then used T-handle 12 intramedullary guide and resected the head at 20  degrees of retroversion with the oscillating saw. We removed excess osteophytes with a rongeur.  We then subluxed the humerus posteriorly, gaining good exposure of the glenoid face.  We removed the glenoid labrum and the capsule, protecting the axillary  nerve the entire time.  We did go ahead and placed our guide pin centered low on the glenoid and then reamed for the metaglene baseplate.  We then did our peripheral hand reaming and then drilled out our central peg hole impacted the metaglene baseplate  into position.  This was an HA coated press fit prosthesis.  We then placed a 30 screw inferiorly, a 36 screw at the base of the coracoid and an 18 screw posteriorly, we locked the superior and inferior screws.  We had excellent baseplate security and  stability.  We then selected a 38 standard plus 0 glenosphere and attached that to the baseplate without difficulty with a screwdriver.  Next, I did a finger sweep make sure no soft tissue was caught up in our metaglene baseplate construct.  We then went  to the humeral side and reamed for the one right metaphysis and then trialled with the 12 stem and one  right set on the 0 setting and impacted in 20 degrees of retroversion.  We used a 38+3 poly trial, reduced the shoulder we are happy with soft tissue  tensioning and balance and stability.  We then retrieved all the trial components.  We  irrigated thoroughly.  We selected the HA coated press fit 12 stem and the one right metaphysis set on the 0 setting.  We then used available bone graft from the  humeral head with impaction grafting technique and impacted the press-fit stem into place.  Again, very happy with our stem stability.  We used a selected the real 38+3 poly and placed it on the humeral tray and impacted into position.  We then reduced  the shoulder, had nice little pop as it reduced very stable shoulder throughout a full arc of motion.  Appropriate conjoined tensioning and no gapping with inferior pole or external rotation.  We irrigated thoroughly again, resected the subscap.  We then  repaired the deltopectoral interval with 0 Vicryl suture followed by 2-0 Vicryl for subcutaneous closure and 4-0 Monocryl for skin.  Steri-Strips applied followed by sterile dressing.  The patient tolerated surgery well.   PUS D: 07/03/2022 10:37:23 am T: 07/03/2022 11:10:00 am  JOB: [id number]/ [id number]

## 2022-07-03 NOTE — Care Plan (Signed)
Ortho Bundle Case Management Note  Patient Details  Name: Sharon Logan MRN: [id number] Date of Birth: [birth year]                  R Rev TSA on 07/03/22.  DCP: Home with son and dtr in law.  DME: Sling and ice machine provided by hospital.  PT: HEP   DME Arranged:  N/A DME Agency:      Additional Comments: Please contact me with any questions of if this plan should need to change.   Dario Ave, Case Manager  [facility]  [phone] 07/03/2022, 3:49 PM

## 2022-07-03 NOTE — Anesthesia Procedure Notes (Signed)
Procedure Name: Intubation Date/Time: 07/03/2022 8:47 AM  Performed by: Cynda Familia, CRNAPre-anesthesia Checklist: Patient identified, Emergency Drugs available, Suction available and Patient being monitored Patient Re-evaluated:Patient Re-evaluated prior to induction Oxygen Delivery Method: Circle System Utilized Preoxygenation: Pre-oxygenation with 100% oxygen Induction Type: IV induction and Cricoid Pressure applied Ventilation: Mask ventilation without difficulty Laryngoscope Size: Miller and 2 Grade View: Grade II Tube type: Oral Number of attempts: 1 Airway Equipment and Method: Stylet Placement Confirmation: ETT inserted through vocal cords under direct vision, positive ETCO2 and breath sounds checked- equal and bilateral Secured at: 21 cm Tube secured with: Tape Dental Injury: Teeth and Oropharynx as per pre-operative assessment  Comments: IV induction Fitzgerald-- intubation AM CRNA atraumatic-- teeth and mouth as preop bilat BS Ola Spurr

## 2022-07-03 NOTE — Anesthesia Postprocedure Evaluation (Signed)
Anesthesia Post Note  Patient: Sharon Logan  Procedure(s) Performed: REVERSE SHOULDER ARTHROPLASTY (Right: Shoulder)     Patient location during evaluation: PACU Anesthesia Type: General and Regional Level of consciousness: awake and alert Pain management: pain level controlled Vital Signs Assessment: post-procedure vital signs reviewed and stable Respiratory status: spontaneous breathing, nonlabored ventilation, respiratory function stable and patient connected to nasal cannula oxygen Cardiovascular status: blood pressure returned to baseline and stable Postop Assessment: no apparent nausea or vomiting Anesthetic complications: no  No notable events documented.  Last Vitals:  Vitals:   07/03/22 1100 07/03/22 1115  BP: (!) 145/81 (!) 144/80  Pulse: 73 82  Resp: 19 20  Temp:    SpO2: 100% 98%    Last Pain:  Vitals:   07/03/22 1115  TempSrc:   PainSc: 0-No pain                 Atom Solivan,W. EDMOND

## 2022-07-03 NOTE — Anesthesia Procedure Notes (Signed)
Anesthesia Regional Block: Interscalene brachial plexus block   Pre-Anesthetic Checklist: , timeout performed,  Correct Patient, Correct Site, Correct Laterality,  Correct Procedure, Correct Position, site marked,  Risks and benefits discussed,  Surgical consent,  Pre-op evaluation,  At surgeon's request and post-op pain management  Laterality: Right  Prep: Maximum Sterile Barrier Precautions used, chloraprep       Needles:  Injection technique: Single-shot  Needle Type: Echogenic Stimulator Needle     Needle Length: 5cm  Needle Gauge: 22     Additional Needles:   Procedures:,,,, ultrasound used (permanent image in chart),,    Narrative:  Start time: 07/03/2022 7:50 AM End time: 07/03/2022 8:00 AM Injection made incrementally with aspirations every 5 mL.  Performed by: Personally  Anesthesiologist: Roderic Palau, MD

## 2022-07-04 DIAGNOSIS — M19011 Primary osteoarthritis, right shoulder: Secondary | ICD-10-CM | POA: Diagnosis not present

## 2022-07-04 LAB — BASIC METABOLIC PANEL
Anion gap: 5 (ref 5–15)
BUN: 15 mg/dL (ref 8–23)
CO2: 26 mmol/L (ref 22–32)
Calcium: 8.1 mg/dL — ABNORMAL LOW (ref 8.9–10.3)
Chloride: 109 mmol/L (ref 98–111)
Creatinine, Ser: 0.73 mg/dL (ref 0.44–1.00)
GFR, Estimated: >60 mL/min (ref >60)
Glucose, Bld: 95 mg/dL (ref 70–99)
Potassium: 3.9 mmol/L (ref 3.5–5.1)
Sodium: 140 mmol/L (ref 135–145)

## 2022-07-04 LAB — HEMOGLOBIN AND HEMATOCRIT, BLOOD
HCT: 32.8 % — ABNORMAL LOW (ref 36.0–46.0)
Hemoglobin: 10.8 g/dL — ABNORMAL LOW (ref 12.0–15.0)

## 2022-07-04 NOTE — Discharge Summary (Signed)
Patient ID: Sharon Logan MRN: [id number] DOB/AGE: February 22, 1956 67 y.o.  Admit date: 07/03/2022 Discharge date: 07/04/2022  Admission Diagnoses:  Principal Problem:   S/P shoulder replacement, right   Discharge Diagnoses:  Same  Past Medical History:  Diagnosis Date   Allergic rhinitis    Anxiety    Arthritis    Cancer ([place])    skin   Chronic pain    Chronically dry eyes    Depression    Fibromyalgia    GERD (gastroesophageal reflux disease)    Headache    Osteoporosis    Stroke ([place])    from gabapenting per pt no deficits   Vitamin D deficiency    Wears glasses     Surgeries: Procedure(s): REVERSE SHOULDER ARTHROPLASTY on 07/03/2022   Consultants:   Discharged Condition: Improved  Hospital Course: Sharon Logan is an 67 y.o. female who was admitted 07/03/2022 for operative treatment ofS/P shoulder replacement, right. Patient has severe unremitting pain that affects sleep, daily activities, and work/hobbies. After pre-op clearance the patient was taken to the operating room on 07/03/2022 and underwent  Procedure(s): [place].    Patient was given perioperative antibiotics:  Anti-infectives (From admission, onward)    Start     Dose/Rate Route Frequency Ordered Stop   07/03/22 1400  ceFAZolin (ANCEF) IVPB 2g/100 mL premix        2 g 200 mL/hr over 30 Minutes Intravenous Every 6 hours 07/03/22 1151 07/04/22 0446   07/03/22 0630  ceFAZolin (ANCEF) IVPB 2g/100 mL premix        2 g 200 mL/hr over 30 Minutes Intravenous On call to O.R. 07/03/22 4967 07/03/22 0849        Patient was given sequential compression devices, early ambulation, and chemoprophylaxis to prevent DVT.  Patient benefited maximally from hospital stay and there were no complications.    Recent vital signs: Patient Vitals for the past 24 hrs:  BP Temp Temp src Pulse Resp SpO2  07/04/22 0449 106/63 97.7 F (36.5 C) Oral 65 18 94 %  07/03/22 2134 113/61 98.4 F (36.9 C) Oral  86 18 94 %  07/03/22 2131 113/61 98.4 F (36.9 C) Oral 69 18 94 %  07/03/22 1432 112/76 97.8 F (36.6 C) Oral 83 15 96 %  07/03/22 1354 129/74 -- -- 79 16 93 %  07/03/22 1215 (!) 142/80 -- -- 69 16 98 %  07/03/22 1130 139/85 -- -- 77 15 99 %  07/03/22 1115 (!) 144/80 -- -- 82 20 98 %  07/03/22 1100 (!) 145/81 -- -- 73 19 100 %  07/03/22 1045 (!) 144/85 -- -- 72 15 100 %  07/03/22 1044 -- -- -- 69 (!) 21 100 %  07/03/22 1042 -- (!) 97.1 F (36.2 C) -- -- -- --  07/03/22 1039 (!) 144/67 (!) 96.1 F (35.6 C) -- 66 15 100 %  07/03/22 0806 (!) 103/59 -- -- 63 13 98 %     Recent laboratory studies:  Recent Labs    07/04/22 0407  HGB 10.8*  HCT 32.8*  NA 140  K 3.9  CL 109  CO2 26  BUN 15  CREATININE 0.73  GLUCOSE 95  CALCIUM 8.1*     Discharge Medications:   Allergies as of 07/04/2022       Reactions   Gabapentin Other (See Comments)   Delirium, stroke like symptoms    Amitriptyline Other (See Comments)   Amlodipine Other (See Comments)   headaches  Polymyxin B Other (See Comments)   Eyes go blood red   Zolpidem Rash   "sleep walk"   Cymbalta [duloxetine Hcl] Other (See Comments)   Headaches, constipation   Fluoxetine Other (See Comments)   CONSTIPATION   Lamotrigine Rash   Other Other (See Comments)   OTOBIOTIC > RED EYES   Pregabalin Itching, Rash   Itchy red rash on chest        Medication List     TAKE these medications    acetaminophen 325 MG tablet Commonly known as: TYLENOL Take 650 mg by mouth every 6 (six) hours as needed for moderate pain.   amLODipine 5 MG tablet Commonly known as: NORVASC Take 5 mg by mouth daily.   baclofen 10 MG tablet Commonly known as: LIORESAL Take 10 mg by mouth 2 (two) times daily.   cetirizine 10 MG tablet Commonly known as: ZYRTEC Take 10 mg by mouth daily as needed for allergies.   cyclobenzaprine 10 MG tablet Commonly known as: FLEXERIL Take 1 tablet (10 mg total) by mouth 2 (two) times daily as  needed for muscle spasms.   diclofenac Sodium 1 % Gel Commonly known as: VOLTAREN Apply 2 g topically daily as needed (pain).   Drysol 20 % external solution Generic drug: aluminum chloride Apply 1 application topically daily as needed (sweat).   EPINEPHrine 0.3 mg/0.3 mL Soaj injection Commonly known as: EPI-PEN Inject 0.3 mg into the muscle as needed for anaphylaxis.   fluticasone 50 MCG/ACT nasal spray Commonly known as: FLONASE Place 2 sprays into both nostrils 2 (two) times daily as needed for allergies or rhinitis.   HYDROcodone-acetaminophen 5-325 MG tablet Commonly known as: Norco Take 1-2 tablets by mouth every 6 (six) hours as needed for moderate pain or severe pain.   lidocaine 5 % Commonly known as: LIDODERM Place 1 patch onto the skin daily.   magnesium 30 MG tablet Take 30 mg by mouth at bedtime.   methocarbamol 500 MG tablet Commonly known as: ROBAXIN Take 1 tablet (500 mg total) by mouth every 8 (eight) hours as needed for muscle spasms.   nitroGLYCERIN 0.2 mg/hr patch Commonly known as: Nitro-Dur Place 1 patch (0.2 mg total) onto the skin daily.   rizatriptan 10 MG tablet Commonly known as: MAXALT Take 10 mg by mouth daily as needed for migraine.   saccharomyces boulardii 250 MG capsule Commonly known as: FLORASTOR Take 250 mg by mouth daily.   sulindac 150 MG tablet Commonly known as: CLINORIL Take 150 mg by mouth 2 (two) times daily.   THERATEARS OP Place 1 drop into both eyes 4 (four) times daily as needed (dry eyes).   traZODone 50 MG tablet Commonly known as: DESYREL Take 75 mg by mouth at bedtime.   Xtampza ER 13.5 MG C12a Generic drug: oxyCODONE ER Take 13.5 mg by mouth every 12 (twelve) hours.        Diagnostic Studies: DG Shoulder Right Port  Result Date: 07/03/2022 CLINICAL DATA:  Status post total right shoulder arthroplasty. EXAM: RIGHT SHOULDER - 1 VIEW COMPARISON:  MRI right shoulder 03/13/2020 FINDINGS: Interval reverse  total right shoulder arthroplasty. No perihardware lucency is seen to indicate hardware failure or loosening. Mild acromioclavicular peripheral degenerative osteophytes. Postsurgical soft tissue air lateral to the shoulder and overlying the subacromial/subdeltoid bursa. No acute fracture or dislocation. The visualized portion of the right lung is unremarkable. Mild-to-moderate calcification within the aortic arch. IMPRESSION: Interval reverse total right shoulder arthroplasty without evidence of hardware failure.  Electronically Signed   By: Yvonne Kendall M.D.   On: 07/03/2022 11:05    Disposition: Discharge disposition: 01-Home or Self Care       Discharge Instructions     Call MD / Call 911   Complete by: As directed    If you experience chest pain or shortness of breath, CALL 911 and be transported to the hospital emergency room.  If you develope a fever above 101 F, pus (white drainage) or increased drainage or redness at the wound, or calf pain, call your surgeon's office.   Constipation Prevention   Complete by: As directed    Drink plenty of fluids.  Prune juice may be helpful.  You may use a stool softener, such as Colace (over the counter) 100 mg twice a day.  Use MiraLax (over the counter) for constipation as needed.   Diet - low sodium heart healthy   Complete by: As directed    Increase activity slowly as tolerated   Complete by: As directed    Post-operative opioid taper instructions:   Complete by: As directed    POST-OPERATIVE OPIOID TAPER INSTRUCTIONS: It is important to wean off of your opioid medication as soon as possible. If you do not need pain medication after your surgery it is ok to stop day one. Opioids include: Codeine, Hydrocodone(Norco, Vicodin), Oxycodone(Percocet, oxycontin) and hydromorphone amongst others.  Long term and even short term use of opiods can cause: Increased pain response Dependence Constipation Depression Respiratory depression And more.   Withdrawal symptoms can include Flu like symptoms Nausea, vomiting And more Techniques to manage these symptoms Hydrate well Eat regular healthy meals Stay active Use relaxation techniques(deep breathing, meditating, yoga) Do Not substitute Alcohol to help with tapering If you have been on opioids for less than two weeks and do not have pain than it is ok to stop all together.  Plan to wean off of opioids This plan should start within one week post op of your joint replacement. Maintain the same interval or time between taking each dose and first decrease the dose.  Cut the total daily intake of opioids by one tablet each day Next start to increase the time between doses. The last dose that should be eliminated is the evening dose.           Follow-up Information     Netta Cedars, MD. Schedule an appointment as soon as possible for a visit in 2 week(s).   Specialty: Orthopedic Surgery Why: call [phone] for appt in two weeks Contact information: [address] [place] [address] [phone]                  Signed: Cecilie Kicks 07/04/2022, 8:03 AM

## 2022-07-04 NOTE — Progress Notes (Signed)
Subjective: 1 Day Post-Op Procedure(s) (LRB): REVERSE SHOULDER ARTHROPLASTY (Right) Patient reports pain as mild.  Block still in effect. No c/o. Feels comfortable. Denies N/V, CP, SOB  Objective: Vital signs in last 24 hours: Temp:  [96.1 F (35.6 C)-98.4 F (36.9 C)] 97.7 F (36.5 C) (01/20 0449) Pulse Rate:  [63-86] 65 (01/20 0449) Resp:  [13-21] 18 (01/20 0449) BP: (103-145)/(59-85) 106/63 (01/20 0449) SpO2:  [93 %-100 %] 94 % (01/20 0449)  Intake/Output from previous day: 01/19 0701 - 01/20 0700 In: 3548 [P.O.:720; I.V.:2328; IV Piggyback:500] Out: 2700 [Urine:2550; Blood:150] Intake/Output this shift: No intake/output data recorded.  Recent Labs    07/04/22 0407  HGB 10.8*   Recent Labs    07/04/22 0407  HCT 32.8*   Recent Labs    07/04/22 0407  NA 140  K 3.9  CL 109  CO2 26  BUN 15  CREATININE 0.73  GLUCOSE 95  CALCIUM 8.1*   No results for input(s): "LABPT", "INR" in the last 72 hours.  Neurologically intact ABD soft Neurovascular intact Sensation intact distally Intact pulses distally Dorsiflexion/Plantar flexion intact Incision: dressing C/D/I and no drainage No cellulitis present Compartment soft No sign of DVT   Assessment/Plan: 1 Day Post-Op Procedure(s) (LRB): REVERSE SHOULDER ARTHROPLASTY (Right) Advance diet Up with therapy D/C IV fluids D/C to home today   Cecilie Kicks 07/04/2022, 8:01 AM

## 2022-07-04 NOTE — Plan of Care (Signed)
  Problem: Pain Management: Goal: Pain level will decrease with appropriate interventions Outcome: Progressing   Problem: Health Behavior/Discharge Planning: Goal: Ability to manage health-related needs will improve Outcome: Progressing   Problem: Activity: Goal: Risk for activity intolerance will decrease Outcome: Progressing   Problem: Pain Managment: Goal: General experience of comfort will improve Outcome: Progressing

## 2022-07-04 NOTE — Evaluation (Signed)
Occupational Therapy Evaluation Patient Details Name: Sharon Logan MRN: [id number] DOB: [birth year] Today's Date: 07/04/2022   History of Present Illness Patient is a 67 year old female who presented with end stage osteoarthritis of R shoulder. Patient underwent R reverse shoulder arthroplasty on 1/19.   PMH: skin cancer, fibromyalgia, osteoporosis, depression, arthritis,   Clinical Impression   Patient is a 67 year old female s/p shoulder replacement without functional use of right dominant upper extremity secondary to effects of surgery and interscalene block and shoulder precautions. Therapist provided education and instruction to patient and daughter in regards to exercises, precautions, positioning, donning upper extremity clothing and bathing while maintaining shoulder precautions, ice and edema management and donning/doffing sling. Patient and daughter verbalized understanding and demonstrated as needed. Patient needed assistance to donn shirt, underwear, pants, socks and shoes and provided with instruction on compensatory strategies to perform ADLs. Patient to follow up with MD for further therapy needs.        Recommendations for follow up therapy are one component of a multi-disciplinary discharge planning process, led by the attending physician.  Recommendations may be updated based on patient status, additional functional criteria and insurance authorization.   Follow Up Recommendations  Follow physician's recommendations for discharge plan and follow up therapies     Assistance Recommended at Discharge Frequent or constant Supervision/Assistance  Patient can return home with the following A little help with bathing/dressing/bathroom;Assistance with cooking/housework;Direct supervision/assist for medications management;Assist for transportation;Help with stairs or ramp for entrance;Direct supervision/assist for financial management    Functional Status Assessment  Patient has had  a recent decline in their functional status and demonstrates the ability to make significant improvements in function in a reasonable and predictable amount of time.  Equipment Recommendations  None recommended by OT       Precautions / Restrictions Precautions Precautions: Shoulder Type of Shoulder Precautions: no ROM shoulder, OK for hand wrist and elbow AROM Shoulder Interventions: Shoulder sling/immobilizer;At all times;Off for dressing/bathing/exercises Precaution Booklet Issued: Yes (comment) (handout) Restrictions Weight Bearing Restrictions: Yes RUE Weight Bearing: Non weight bearing      Mobility Bed Mobility Overal bed mobility: Modified Independent      Transfers Overall transfer level: Modified independent          Balance Overall balance assessment: No apparent balance deficits (not formally assessed)             ADL either performed or assessed with clinical judgement   ADL Overall ADL's : Needs assistance/impaired Eating/Feeding: Supervision/ safety;Sitting   Grooming: Wash/dry face;Set up;Sitting   Upper Body Bathing: Minimal assistance;Sitting Upper Body Bathing Details (indicate cue type and reason): education provided to patient on technique Lower Body Bathing: Minimal assistance   Upper Body Dressing : Minimal assistance Upper Body Dressing Details (indicate cue type and reason): education on leaning and not moving shoulder. needed consistent cues to keep elow at side. daughter called on facetime and educated as well Lower Body Dressing: Min guard;Sitting/lateral leans;Sit to/from stand Lower Body Dressing Details (indicate cue type and reason): educated on e ct to pants/undergarments at home. pt verbalized understanding Toilet Transfer: Supervision/safety;Ambulation Toilet Transfer Details (indicate cue type and reason): no AD Toileting- Clothing Manipulation and Hygiene: Supervision/safety                Pertinent Vitals/Pain Pain  Assessment Pain Assessment: No/denies pain (block is in place)     Hand Dominance Right   Extremity/Trunk Assessment Upper Extremity Assessment Upper Extremity Assessment: RUE deficits/detail RUE  Deficits / Details: h/o shoulder surgery 1/19. no shoulder ROM allowed. patient able to wiggle fingers reports numbness still in place.   Lower Extremity Assessment Lower Extremity Assessment: Overall WFL for tasks assessed   Cervical / Trunk Assessment Cervical / Trunk Assessment: Normal   Communication Communication Communication: No difficulties   Cognition Arousal/Alertness: Awake/alert Behavior During Therapy: WFL for tasks assessed/performed Overall Cognitive Status: Within Functional Limits for tasks assessed         General Comments: noted to have some difficulty with not moving RUE. daughter was contacted via face time to discuss educaiton.           Shoulder Instructions Shoulder Instructions Donning/doffing shirt without moving shoulder: Supervision/safety Method for sponge bathing under operated UE: Minimal assistance;Caregiver independent with task Donning/doffing sling/immobilizer: Minimal assistance;Caregiver independent with task Correct positioning of sling/immobilizer: Minimal assistance;Caregiver independent with task ROM for elbow, wrist and digits of operated UE: Modified independent Sling wearing schedule (on at all times/off for ADL's): Modified independent Proper positioning of operated UE when showering: Minimal assistance;Caregiver independent with task Positioning of UE while sleeping: Supervision/safety;Caregiver independent with task    Home Living Family/patient expects to be discharged to:: Private residence Living Arrangements: Alone Available Help at Discharge: Family;Available 24 hours/day (stay with daughter) Type of Home: House Home Access: Level entry     Home Layout: One level     Bathroom Shower/Tub: Marketing executive: None          Prior Functioning/Environment Prior Level of Function : Independent/Modified Independent                OT Problem List: Decreased activity tolerance;Impaired balance (sitting and/or standing);Decreased coordination;Impaired UE functional use;Pain;Decreased safety awareness;Decreased knowledge of precautions      OT Treatment/Interventions: Self-care/ADL training;Energy conservation;Therapeutic exercise;DME and/or AE instruction;Therapeutic activities;Patient/family education;Balance training    OT Goals(Current goals can be found in the care plan section) Acute Rehab OT Goals Patient Stated Goal: to get home OT Goal Formulation: All assessment and education complete, DC therapy  OT Frequency: Min 2X/week       AM-PAC OT "6 Clicks" Daily Activity     Outcome Measure Help from another person eating meals?: A Little Help from another person taking care of personal grooming?: A Little Help from another person toileting, which includes using toliet, bedpan, or urinal?: A Little Help from another person bathing (including washing, rinsing, drying)?: A Little Help from another person to put on and taking off regular upper body clothing?: A Little Help from another person to put on and taking off regular lower body clothing?: A Little 6 Click Score: 18   End of Session Equipment Utilized During Treatment: Other (comment) (sling) Nurse Communication: Mobility status  Activity Tolerance: Patient tolerated treatment well Patient left: in chair;with call bell/phone within reach;with chair alarm set;with nursing/sitter in room  OT Visit Diagnosis: Unsteadiness on feet (R26.81);Pain Pain - Right/Left: Right Pain - part of body: Shoulder                Time: 1517-6160 OT Time Calculation (min): 30 min Charges:  OT General Charges $OT Visit: 1 Visit OT Evaluation $OT Eval Low Complexity: 1 Low OT Treatments $Self Care/Home Management : 8-22 mins  Rennie Plowman, MS Acute Rehabilitation Department Office# [phone]   Willa Rough 07/04/2022, 10:48 AM

## 2022-07-04 NOTE — TOC CM/SW Note (Signed)
Transition of Care [facility]) Screening Note  Patient Details  Name: YAILINE BALLARD Date of Birth: Nov 24, 1955  [facility] of [facility]) CM/SW Contact:    Sherie Don, LCSW Phone Number: 07/04/2022, 9:09 AM  Transition of Care Department [facility]) has reviewed patient and no TOC needs have been identified at this time. We will continue to monitor patient advancement through interdisciplinary progression rounds. If new patient transition needs arise, please place a TOC consult.

## 2022-07-04 NOTE — Progress Notes (Signed)
The patient is alert and oriented and has been seen by her physician. The orders for discharge were written. IV has been removed. Aquacel dressing is clean, dry, and intact. Went over discharge instructions with patient. She is being discharged via wheelchair with all of her belongings.  

## 2022-07-06 ENCOUNTER — Encounter (HOSPITAL_COMMUNITY): Payer: Self-pay | Admitting: Orthopedic Surgery

## 2022-07-10 ENCOUNTER — Telehealth: Payer: Self-pay | Admitting: Pharmacy Technician

## 2022-07-10 NOTE — Telephone Encounter (Signed)
Patient Advocate Encounter   Received notification that prior authorization for Nurtec '75MG'$  dispersible tablets is required.   PA submitted on 07/10/2022 Key [id number] Status is pending       Lyndel Safe, CPhT Pharmacy Patient Advocate Specialist [place] Patient Advocate Team Direct Number: [phone]  Fax: [phone]

## 2022-07-21 DIAGNOSIS — Z4789 Encounter for other orthopedic aftercare: Secondary | ICD-10-CM | POA: Diagnosis not present

## 2022-07-23 DIAGNOSIS — M5136 Other intervertebral disc degeneration, lumbar region: Secondary | ICD-10-CM | POA: Diagnosis not present

## 2022-07-23 DIAGNOSIS — M7741 Metatarsalgia, right foot: Secondary | ICD-10-CM | POA: Diagnosis not present

## 2022-07-23 DIAGNOSIS — M7742 Metatarsalgia, left foot: Secondary | ICD-10-CM | POA: Diagnosis not present

## 2022-07-23 DIAGNOSIS — M961 Postlaminectomy syndrome, not elsewhere classified: Secondary | ICD-10-CM | POA: Diagnosis not present

## 2022-07-23 DIAGNOSIS — M533 Sacrococcygeal disorders, not elsewhere classified: Secondary | ICD-10-CM | POA: Diagnosis not present

## 2022-07-23 DIAGNOSIS — M542 Cervicalgia: Secondary | ICD-10-CM | POA: Diagnosis not present

## 2022-07-23 DIAGNOSIS — M25551 Pain in right hip: Secondary | ICD-10-CM | POA: Diagnosis not present

## 2022-07-24 ENCOUNTER — Ambulatory Visit (INDEPENDENT_AMBULATORY_CARE_PROVIDER_SITE_OTHER): Payer: Medicare HMO | Admitting: Psychiatry

## 2022-07-24 ENCOUNTER — Encounter: Payer: Self-pay | Admitting: Psychiatry

## 2022-07-24 VITALS — Wt 131.0 lb

## 2022-07-24 DIAGNOSIS — F339 Major depressive disorder, recurrent, unspecified: Secondary | ICD-10-CM

## 2022-07-24 DIAGNOSIS — G4700 Insomnia, unspecified: Secondary | ICD-10-CM | POA: Diagnosis not present

## 2022-07-24 DIAGNOSIS — F4310 Post-traumatic stress disorder, unspecified: Secondary | ICD-10-CM | POA: Diagnosis not present

## 2022-07-24 MED ORDER — CITALOPRAM HYDROBROMIDE 10 MG PO TABS: ORAL_TABLET | ORAL | Status: DC

## 2022-07-24 MED ORDER — VILAZODONE HCL 10 MG PO TABS: ORAL_TABLET | ORAL | 1 refills | Status: DC

## 2022-07-24 NOTE — Progress Notes (Unsigned)
DAVEONA STADICK [id number] 1955-10-30 67 y.o.  Subjective:   Patient ID:  Sharon Logan is a 67 y.o. (DOB 08-05-1955) female.  Chief Complaint: No chief complaint on file.   HPI Sharon Logan presents to the office today for follow-up of ***  Last seen 07/11/21. She reports that she sought care through the [place]. "I'm here to see you because I am suffering with depression and anxiety."   Had shoulder surgery 3 weeks ago and experienced depression and anxiety prior to this. She noticed the start of anxiety and depression around the holidays. Mood is currently persistently depressed. Denies social anxiety. "I want to be around people, I know I need to be around people, but not necessarily making the effort to do it." She reports panic attacks about once a week on average. She reports some worry. She has had some occ intrusive memories. Denies flashbacks or nightmares.  She reports low energy and motivation. She reports impaired concentration. She has been diagnosed with sleep apnea. She reports difficulty falling asleep (about 2-3 hours for sleep initiation) and waking up at least once a night. Estimates sleeping about 4 hours a night on average. Appetite "comes and goes" and she attributes this to long-term use of antibiotics. She has lost almost 10 lbs in the last 6 months.  She reports that she is not enjoying anything. Diminished interest in things. Denies SI.    She had foot surgery in late June and had to have revision Labor day weekend due to osteomyelitis.   She has been taking Citalopram 10 mg po qd and felt that depression was "heavier" after taking 20 mg dose a few times. Feels depression is "worse" on the days she does not take Citalopram.  She reports that she plans to re-start therapy with [name], [facility].   Past Medication Trials: Cymbalta- Started after back surgery. Has made her tired, even when reduced to 30 mg. Was having to take 2 hour naps. May have helped slightly with  pain. Had HA, Constipation, Nausea Savella- muscle aches, rhabdomyolysis Effexor-HA, helped with excessive sweating.  Pristiq- May have increased depression. Prozac- Constipation. Causes her to feel cold.  Celexa- Worsening depression at 20 mg dose Lexapro- "My anti-depressant of choice" and gives her energy. Was taking 2-3 years prior to surgery. Re-started one week ago. Has taken 10 mg and 20 mg doses in the past. Thinks it may have helped with pain in the past. Zoloft- worsening mood Paxil Wellbutrin- Did not cause HA's. Tolerated well and was effective for depression. Amitriptyline- Did not cause HA's. Weight gain. Effective.  Nortriptyline- Did not cause HA's. Effective. Lamictal- Rash Gabapentin -Rhabomyolysis Lyrica- Adverse reaction Buspar- Helpful for anxiety in the past. Well-tolerated. Trazodone- helped with sleep initiation but sleep was not restful. Severe dry mouth. Ambien- Parasomnia Lunesta- Tolerated, effective at 2 mg dose Remeron- Dry eyes, internal restlessness. Stomach discomfort.  Clonidine Topamax- ineffective for headaches. Caused cognitive side effects.  Hydroxyzine- Initially helped with anxiety and sleep initiation, then no longer as helpful    PHQ2-9    [place] Visit from 05/25/2022 in [place] Visit from 10/06/2021 in [place] Visit from 03/25/2021 in [place] Visit from 12/24/2020 in [place] Visit from 10/08/2020 in [place]  PHQ-2 Total Score 0 0 0 2 2  PHQ-9 Total Score -- -- -- --  [place] Admission (Discharged) from 07/03/2022 in [place] [facility] from 06/18/2022 in [place] ED from 10/12/2021 in [facility] Department at [place] No Risk No Risk No Risk        Review of Systems:  Review of Systems  Gastrointestinal: Negative.   Musculoskeletal:  Negative for gait problem.       Shoulder pain- recovering from surgery  Neurological:  Positive for headaches.       Neuropathy in legs.   Psychiatric/Behavioral:         Please refer to HPI    Medications: I have reviewed the patient's current medications.  Current Outpatient Medications  Medication Sig Dispense Refill   acetaminophen (TYLENOL) 325 MG tablet Take 650 mg by mouth every 6 (six) hours as needed for moderate pain.     amLODipine (NORVASC) 5 MG tablet Take 5 mg by mouth daily.     baclofen (LIORESAL) 10 MG tablet Take 10 mg by mouth 2 (two) times daily.     Carboxymethylcellulose Sodium (THERATEARS OP) Place 1 drop into both eyes 4 (four) times daily as needed (dry eyes).     cetirizine (ZYRTEC) 10 MG tablet Take 10 mg by mouth daily as needed for allergies.     cyclobenzaprine (FLEXERIL) 10 MG tablet Take 1 tablet (10 mg total) by mouth 2 (two) times daily as needed for muscle spasms. 12 tablet 0   diclofenac Sodium (VOLTAREN) 1 % GEL Apply 2 g topically daily as needed (pain).     DRYSOL 20 % external solution Apply 1 application topically daily as needed (sweat).     EPINEPHrine 0.3 mg/0.3 mL IJ SOAJ injection Inject 0.3 mg into the muscle as needed for anaphylaxis.     fluticasone (FLONASE) 50 MCG/ACT nasal spray Place 2 sprays into both nostrils 2 (two) times daily as needed for allergies or rhinitis.     HYDROcodone-acetaminophen (NORCO) 5-325 MG tablet Take 1-2 tablets by mouth every 6 (six) hours as needed for moderate pain or severe pain. 30 tablet 0   lidocaine (LIDODERM) 5 % Place 1 patch onto the skin daily.     magnesium 30 MG tablet Take 30 mg by mouth at bedtime.     methocarbamol (ROBAXIN) 500 MG tablet Take 1 tablet (500 mg total) by mouth every 8 (eight) hours as needed for muscle spasms. 40 tablet 1    nitroGLYCERIN (NITRO-DUR) 0.2 mg/hr patch Place 1 patch (0.2 mg total) onto the skin daily. (Patient not taking: Reported on 06/12/2022) 30 patch 2   rizatriptan (MAXALT) 10 MG tablet Take 10 mg by mouth daily as needed for migraine.      saccharomyces boulardii (FLORASTOR) 250 MG capsule Take 250 mg by mouth daily.     sulindac (CLINORIL) 150 MG tablet Take 150 mg by mouth 2 (two) times daily.     traZODone (DESYREL) 50 MG tablet Take 75 mg by mouth at bedtime.     XTAMPZA ER 13.5 MG C12A Take 13.5 mg by mouth every 12 (twelve) hours.     No current facility-administered medications for this visit.    Medication Side Effects: None  Allergies:  Allergies  Allergen Reactions   Gabapentin Other (See Comments)    Delirium, stroke like symptoms     Amitriptyline Other (See Comments)   Amlodipine Other (See Comments)    headaches   Polymyxin B  Other (See Comments)    Eyes go blood red    Zolpidem Rash    "sleep walk"    Cymbalta [Duloxetine Hcl] Other (See Comments)    Headaches, constipation   Fluoxetine Other (See Comments)    CONSTIPATION    Lamotrigine Rash   Other Other (See Comments)    OTOBIOTIC > RED EYES   Pregabalin Itching and Rash    Itchy red rash on chest      Past Medical History:  Diagnosis Date   Allergic rhinitis    Anxiety    Arthritis    Cancer (HCC)    skin   Chronic pain    Chronically dry eyes    Depression    Fibromyalgia    GERD (gastroesophageal reflux disease)    Headache    Osteoporosis    Stroke ([place])    from gabapenting per pt no deficits   Vitamin D deficiency    Wears glasses     Past Medical History, Surgical history, Social history, and Family history were reviewed and updated as appropriate.   Please see review of systems for further details on the patient's review from today.   Objective:   Physical Exam:  There were no vitals taken for this visit.  Physical Exam  Lab Review:     Component Value Date/Time    NA 140 07/04/2022 0407   NA 142 06/03/2021 1623   K 3.9 07/04/2022 0407   CL 109 07/04/2022 0407   CO2 26 07/04/2022 0407   GLUCOSE 95 07/04/2022 0407   BUN 15 07/04/2022 0407   BUN 21 06/03/2021 1623   CREATININE 0.73 07/04/2022 0407   CALCIUM 8.1 (L) 07/04/2022 0407   PROT 7.4 04/21/2021 0910   ALBUMIN 3.8 04/21/2021 0910   AST 26 04/21/2021 0910   ALT 21 04/21/2021 0910   ALKPHOS 120 04/21/2021 0910   BILITOT 0.7 04/21/2021 0910   GFRNONAA >60 07/04/2022 0407   GFRAA >60 06/26/2019 1008       Component Value Date/Time   WBC 7.0 06/18/2022 1036   RBC 4.71 06/18/2022 1036   HGB 10.8 (L) 07/04/2022 0407   HGB 12.1 06/03/2021 1622   HCT 32.8 (L) 07/04/2022 0407   HCT 37.6 06/03/2021 1622   PLT 294 06/18/2022 1036   PLT 336 06/03/2021 1622   MCV 94.5 06/18/2022 1036   MCV 92 06/03/2021 1622   MCH 29.9 06/18/2022 1036   MCHC 31.7 06/18/2022 1036   RDW 12.8 06/18/2022 1036   RDW 12.8 06/03/2021 1622   LYMPHSABS 2.0 06/03/2021 1622   MONOABS 0.5 04/21/2021 0910   EOSABS 0.1 06/03/2021 1622   BASOSABS 0.0 06/03/2021 1622    No results found for: "POCLITH", "LITHIUM"   No results found for: "PHENYTOIN", "PHENOBARB", "VALPROATE", "CBMZ"   .res Assessment: Plan:    There are no diagnoses linked to this encounter.   Please see After Visit Summary for patient specific instructions.  Future Appointments  Date Time Provider [place]  07/27/2022 10:20 AM Raulkar, Clide Deutscher, MD [facility] CPR  09/28/2022 10:40 AM Ranell Patrick, Clide Deutscher, MD [facility] CPR  10/07/2022  9:30 AM Pieter Partridge, DO [facility] None    No orders of the defined types were placed in this encounter.   -------------------------------

## 2022-07-25 ENCOUNTER — Telehealth: Payer: Self-pay

## 2022-07-27 ENCOUNTER — Telehealth: Payer: Self-pay | Admitting: Psychiatry

## 2022-07-27 ENCOUNTER — Encounter: Payer: Self-pay | Admitting: Physical Medicine and Rehabilitation

## 2022-07-27 ENCOUNTER — Encounter: Payer: Medicare HMO | Attending: Physical Medicine and Rehabilitation | Admitting: Physical Medicine and Rehabilitation

## 2022-07-27 VITALS — BP 112/72 | HR 63 | Ht 63.0 in | Wt 133.0 lb

## 2022-07-27 DIAGNOSIS — M7918 Myalgia, other site: Secondary | ICD-10-CM

## 2022-07-27 MED ORDER — LIDOCAINE HCL 1 % IJ SOLN
5.0000 mL | Freq: Once | INTRAMUSCULAR | Status: AC
  Administered 2022-07-27: 5 mL via INTRADERMAL

## 2022-07-27 NOTE — Telephone Encounter (Signed)
Pt LVM on 2/9@ 3:57p.  The medicine recently  prescribed needs a PA.  (Looks like it might be the Viibryd).  Next appt 3/14

## 2022-07-27 NOTE — Progress Notes (Signed)

## 2022-07-27 NOTE — Telephone Encounter (Signed)
It is in [facility] and I completed and saved. Just need to be sure I used the correct diagnosis.

## 2022-07-27 NOTE — Telephone Encounter (Signed)
Reviewed and submitted but it says she has already had a clinical denial in the last 60 days. I did not submit one that I am remember and I do not see one in CMM.

## 2022-07-28 ENCOUNTER — Telehealth (HOSPITAL_COMMUNITY): Payer: Self-pay

## 2022-07-28 ENCOUNTER — Other Ambulatory Visit (HOSPITAL_COMMUNITY): Payer: Self-pay

## 2022-07-28 DIAGNOSIS — R1310 Dysphagia, unspecified: Secondary | ICD-10-CM

## 2022-07-28 NOTE — Telephone Encounter (Signed)
Attempted to contact patient to schedule OP Modified Barium Swallow - left voicemail.

## 2022-07-28 NOTE — Telephone Encounter (Signed)
Pt lvm that the diagnosis is not on the PA for [employer] please call [phone]

## 2022-07-28 NOTE — Telephone Encounter (Signed)
Please see message. °

## 2022-07-30 NOTE — Telephone Encounter (Signed)
Office notes and updated information faxed to [facility] for review.

## 2022-08-02 DIAGNOSIS — G4733 Obstructive sleep apnea (adult) (pediatric): Secondary | ICD-10-CM | POA: Diagnosis not present

## 2022-08-06 DIAGNOSIS — G8929 Other chronic pain: Secondary | ICD-10-CM | POA: Diagnosis not present

## 2022-08-06 DIAGNOSIS — M810 Age-related osteoporosis without current pathological fracture: Secondary | ICD-10-CM | POA: Diagnosis not present

## 2022-08-06 DIAGNOSIS — G4733 Obstructive sleep apnea (adult) (pediatric): Secondary | ICD-10-CM | POA: Diagnosis not present

## 2022-08-06 DIAGNOSIS — M797 Fibromyalgia: Secondary | ICD-10-CM | POA: Diagnosis not present

## 2022-08-06 DIAGNOSIS — Z6824 Body mass index (BMI) 24.0-24.9, adult: Secondary | ICD-10-CM | POA: Diagnosis not present

## 2022-08-11 DIAGNOSIS — G894 Chronic pain syndrome: Secondary | ICD-10-CM | POA: Diagnosis not present

## 2022-08-11 DIAGNOSIS — G4733 Obstructive sleep apnea (adult) (pediatric): Secondary | ICD-10-CM | POA: Diagnosis not present

## 2022-08-13 ENCOUNTER — Ambulatory Visit: Payer: Medicare HMO

## 2022-08-13 NOTE — Telephone Encounter (Signed)
Please notify patient.

## 2022-08-13 NOTE — Telephone Encounter (Signed)
Patient said the pharmacy had notified her and she has already picked up. She confirmed date/time for her next appt.

## 2022-08-13 NOTE — Telephone Encounter (Signed)
Appeal submitted for Vilazodone 10 mg with [employer], redetermination is an approval effective 06/15/2022-06/15/2023   ID# [id number]

## 2022-08-15 DIAGNOSIS — G894 Chronic pain syndrome: Secondary | ICD-10-CM | POA: Diagnosis not present

## 2022-08-18 ENCOUNTER — Ambulatory Visit (HOSPITAL_COMMUNITY)
Admission: RE | Admit: 2022-08-18 | Discharge: 2022-08-18 | Disposition: A | Discharge status: Home / Self Care | Payer: Medicare HMO | Source: Ambulatory Visit | Attending: Family Medicine | Admitting: Family Medicine

## 2022-08-18 ENCOUNTER — Other Ambulatory Visit (HOSPITAL_COMMUNITY): Payer: Self-pay | Admitting: Family Medicine

## 2022-08-18 DIAGNOSIS — R6339 Other feeding difficulties: Secondary | ICD-10-CM | POA: Diagnosis not present

## 2022-08-18 DIAGNOSIS — R1310 Dysphagia, unspecified: Secondary | ICD-10-CM

## 2022-08-18 DIAGNOSIS — E559 Vitamin D deficiency, unspecified: Secondary | ICD-10-CM | POA: Diagnosis not present

## 2022-08-18 DIAGNOSIS — K117 Disturbances of salivary secretion: Secondary | ICD-10-CM | POA: Insufficient documentation

## 2022-08-18 DIAGNOSIS — M797 Fibromyalgia: Secondary | ICD-10-CM | POA: Diagnosis not present

## 2022-08-18 DIAGNOSIS — Z85828 Personal history of other malignant neoplasm of skin: Secondary | ICD-10-CM | POA: Diagnosis not present

## 2022-08-18 DIAGNOSIS — Z8673 Personal history of transient ischemic attack (TIA), and cerebral infarction without residual deficits: Secondary | ICD-10-CM | POA: Diagnosis not present

## 2022-08-18 DIAGNOSIS — R1313 Dysphagia, pharyngeal phase: Secondary | ICD-10-CM | POA: Diagnosis not present

## 2022-08-18 DIAGNOSIS — I7300 Raynaud's syndrome without gangrene: Secondary | ICD-10-CM | POA: Insufficient documentation

## 2022-08-18 NOTE — Progress Notes (Signed)
Modified Barium Swallow Study  Patient Details  Name: Sharon Logan MRN: [id number] Date of Birth: 1955/09/04  Today's Date: 08/18/2022  HPI/PMH: HPI: pt is a 67 yo female referred for OP MBS due to pt report of having difficulty swallowing foods since November 2023 with gustatory changes as well and xerstomia. Pt with PMH + for ? TIA *due to Gabapentin per pt* resulting in transient left sided weakness that resolved completely, Raynaud's, skin cancer, Vit D deficiency, cervical radiculopathy, depression and fibromyalgia. Pt denies requiring heimlich manuever,  having recurrent pneumonias.  She reports coughing up just food at times.  PSH reverse arthroplasty.  Pt denies issues with swallowing pills.   Clinical Impression: Clinical Impression: Patient presents with minimal pharyngeal impairments - c/b decreased laryngeal elevation and closure allowing inconsistent laryngeal penetration of thin liquids, most notably with sequential boluses.  No aspiration observed.  Barium tablet taken with thin momentarily halted at pyriform sinus *tested in A-P view* with pt sensation as she consumed more liquids to clear.   Pt sensed tablet stalling but also sensed it cleared.  At one time, she pointed to the area between left sternum and left clavicle sensing discomfort.  SLP recommends pt continue diet with brief SLP treatment primarily to focus on laryngeal elevation/closure for airway protection.  Recommend pt start all intake with liquids and consume liquids t/o meal to compensate for her known xerostomia.  Advised pt to take her medication with food if problematic to swallow with liquids. Using video fluoroscopy loops, educated pt to findings/recommendations.  Factors that may increase risk of adverse event in presence of aspiration ([place] 2021): Factors that may increase risk of adverse event in presence of aspiration Phineas Douglas & Padilla 2021): Reduced saliva   Recommendations/Plan: Swallowing  Evaluation Recommendations Swallowing Evaluation Recommendations Recommendations: PO diet PO Diet Recommendation: Regular; Thin liquids (Level 0) Liquid Administration via: Cup; Straw Medication Administration: Whole meds with liquid Supervision: Patient able to self-feed Swallowing strategies  : Slow rate; Small bites/sips (Start intake with liquids) Postural changes: Position pt fully upright for meals; Stay upright 30-60 min after meals Oral care recommendations: Oral care BID (2x/day)    Treatment Plan Treatment Plan Treatment recommendations: Defer treatment plan to SLP at other venue (see follow-up recommendations) Follow-up recommendations: Outpatient SLP     Recommendations Recommendations for follow up therapy are one component of a multi-disciplinary discharge planning process, led by the attending physician.  Recommendations may be updated based on patient status, additional functional criteria and insurance authorization.  Assessment: Orofacial Exam: Orofacial Exam Oral Cavity: Oral Hygiene: Xerostomia Oral Cavity - Dentition: Adequate natural dentition Orofacial Anatomy: WFL Oral Motor/Sensory Function: WFL    Anatomy:  Anatomy: WFL   Thin Liquids: Thin Liquids (Level 0) Thin Liquids : Impaired Bolus delivery method: Cup; Straw; Spoon Thin Liquid - Impairment: Pharyngeal impairment Initiation of swallow : Pyriform sinuses Soft palate elevation: No bolus between soft palate (SP)/pharyngeal wall (PW) Laryngeal elevation: Partial superior movement of thyroid cartilage/partial approximation of arytenoids to epiglottic petiole Anterior hyoid excursion: Partial Epiglottic movement: Complete Laryngeal vestibule closure: Incomplete, narrow column air/contrast in laryngeal vestibule Pharyngeal stripping wave : Present - complete Pharyngoesophageal segment opening: Partial distention/partial duration, partial obstruction of flow Tongue base retraction: Narrow  column of contrast or air between tongue base and PPW Pharyngeal residue: Trace residue within or on pharyngeal structures Location of pharyngeal residue: Aryepiglottic folds Penetration/Aspiration Scale (PAS) score: 3.  Material enters airway, remains ABOVE vocal cords and not ejected out  Mildly Thick Liquids: No data recorded   Moderately Thick Liquids: No data recorded   Puree: Puree Puree: Impaired Puree - Impairment: Pharyngeal impairment Initiation of swallow: Valleculae Soft palate elevation: No bolus between soft palate (SP)/pharyngeal wall (PW) Laryngeal elevation: Partial superior movement of thyroid cartilage/partial approximation of arytenoids to epiglottic petiole Anterior hyoid excursion: Partial Epiglottic movement: Complete Laryngeal vestibule closure: Incomplete, narrow column air/contrast in laryngeal vestibule Pharyngeal stripping wave : Present - diminished Pharyngeal contraction (A/P view only): N/A Pharyngoesophageal segment opening: Partial distention/partial duration, partial obstruction of flow Tongue base retraction: Trace column of contrast or air between tongue base and PPW Pharyngeal residue: Trace residue within or on pharyngeal structures Location of pharyngeal residue: Tongue base Penetration/Aspiration Scale (PAS) score: 1.  Material does not enter airway    Solid: Solid Solid: Impaired Solid - Impairment: Pharyngeal impairment Initiation of swallow: Valleculae Soft palate elevation: No bolus between soft palate (SP)/pharyngeal wall (PW) Laryngeal elevation: Partial superior movement of thyroid cartilage/partial approximation of arytenoids to epiglottic petiole Anterior hyoid excursion: Partial Epiglottic movement: Complete Laryngeal vestibule closure: Incomplete, narrow column air/contrast in laryngeal vestibule Pharyngeal stripping wave : Present - complete Pharyngoesophageal segment opening: Partial distention/partial duration,  partial obstruction of flow Tongue base retraction: No contrast between tongue base and posterior pharyngeal wall (PPW) Pharyngeal residue: Trace residue within or on pharyngeal structures Location of pharyngeal residue: Aryepiglottic folds Penetration/Aspiration Scale (PAS) score: 1.  Material does not enter airway    Pill: Pill Pill: Impaired Consistency administered : thin Pill - Impairment: Pharyngeal impairment Initiation of swallow : Posterior laryngeal surface of the epiglottis Soft palate elevation: No bolus between soft palate (SP)/pharyngeal wall (PW) Laryngeal elevation: Partial superior movement of thyroid cartilage/partial approximation of arytenoids to epiglottic petiole Anterior hyoid excursion: Partial Epiglottic movement: Partial Laryngeal vestibule closure: Incomplete, narrow column air/contrast in laryngeal vestibule Pharyngeal stripping wave : Present - diminished Pharyngeal contraction (A/P view only): Bilateral bulging Pharyngoesophageal segment opening: Partial distention/partial duration, partial obstruction of flow Pharyngeal residue: Majority of contrast within or on pharyngeal structures Location of pharyngeal residue: Pyriform sinuses (tablet halted at pyriform sinuses) Penetration/Aspiration Scale (PAS) score: 1.  Material does not enter airway    Compensatory Strategies: Compensatory Strategies Compensatory strategies: No       General Information: No data recorded  Diet Prior to this Study: Regular; Thin liquids (Level 0)    Temperature : Normal    Respiratory Status: WFL    Supplemental O2: None (Room air)    History of Recent Intubation: No   Behavior/Cognition: Alert; Cooperative; Pleasant mood  No data recorded Baseline vocal quality/speech: Normal  Volitional Cough: Able to elicit  Volitional Swallow: Able to elicit  Exam Limitations: No limitations   Goal Planning: No data recorded No data recorded No data recorded No  data recorded No data recorded  Pain: Pain Assessment Pain Assessment: No/denies pain (block is in place)    End of Session: Start Time:SLP Start Time (ACUTE ONLY): 1308  Stop Time: SLP Stop Time (ACUTE ONLY): 1345  Time Calculation:SLP Time Calculation (min) (ACUTE ONLY): 37 min  Charges: SLP Evaluations $ SLP Speech Visit: 1 Visit  SLP Evaluations $Outpatient MBS Swallow: 1 Procedure   SLP visit diagnosis: SLP Visit Diagnosis: Dysphagia, pharyngeal phase (R13.13)    Past Medical History:  Past Medical History:  Diagnosis Date   Allergic rhinitis    Anxiety    Arthritis    Cancer (HCC)    skin   Chronic pain  Chronically dry eyes    Depression    Fibromyalgia    GERD (gastroesophageal reflux disease)    Headache    Osteoporosis    Sleep apnea    Stroke ([place])    from gabapenting per pt no deficits   Vitamin D deficiency    Wears glasses    Past Surgical History:  Past Surgical History:  Procedure Laterality Date   ABDOMINAL HYSTERECTOMY     BACK SURGERY     BUNIONECTOMY Bilateral    EYE SURGERY     laser surgery   NASAL SINUS SURGERY     x2   REVERSE SHOULDER ARTHROPLASTY Right 07/03/2022   Procedure: REVERSE SHOULDER ARTHROPLASTY;  Surgeon: Netta Cedars, MD;  Location: [facility];  Service: Orthopedics;  Laterality: Right;  120 min choice with interscalene block   SACROILIAC JOINT FUSION Bilateral 09/12/2020   Procedure: REMOVAL OF BILATERAL PELVIC SCREWS;  Surgeon: Phylliss Bob, MD;  Location: [facility];  Service: Orthopedics;  Laterality: Bilateral;   SKIN CANCER EXCISION     face x3   TUBAL LIGATION      Macario Golds 08/18/2022, 2:55 PM

## 2022-08-18 NOTE — Therapy (Signed)
OUTPATIENT PHYSICAL THERAPY CERVICAL EVALUATION/ No Charge    ASSESSMENT:  CLINICAL IMPRESSION: Patient is a 67 y.o. female who was seen today for physical therapy evaluation and treatment for cervical Myofascial syndrome.  Pt reported that she had rTSR. Surgery on 07-03-22 and has appt at [facility] on 09-04-22 but possibly earlier because she has a phone message to return.  PT offered to do evaluation for both Cervical Myofascial Syndrome and rTSR but pt chose to remain at the one facility at [facility] for her care.  Pt left with no additional questions. No charge visit.    Voncille Lo, PT, [place] Certified Exercise Expert for the Aging Adult  08/20/22 1:48 PM Phone: [phone] Fax: [phone]  Patient Name: Sharon Logan MRN: [id number] DOB:1955-10-20, 67 y.o., female Today's Date: 08/20/2022  END OF SESSION:  PT End of Session - 08/20/22 1347     Visit Number 1    Number of Visits 1    Date for PT Re-Evaluation 08/20/22    PT Start Time 1330    PT Stop Time 1340    PT Time Calculation (min) 10 min             Past Medical History:  Diagnosis Date   Allergic rhinitis    Anxiety    Arthritis    Cancer ([place])    skin   Chronic pain    Chronically dry eyes    Depression    Fibromyalgia    GERD (gastroesophageal reflux disease)    Headache    Osteoporosis    Sleep apnea    Stroke ([place])    from gabapenting per pt no deficits   Vitamin D deficiency    Wears glasses    Past Surgical History:  Procedure Laterality Date   ABDOMINAL HYSTERECTOMY     BACK SURGERY     BUNIONECTOMY Bilateral    EYE SURGERY     laser surgery   NASAL SINUS SURGERY     x2   REVERSE SHOULDER ARTHROPLASTY Right 07/03/2022   Procedure: REVERSE SHOULDER ARTHROPLASTY;  Surgeon: Netta Cedars, MD;  Location: [facility];  Service: Orthopedics;  Laterality: Right;  120 min choice with interscalene block   SACROILIAC JOINT FUSION Bilateral 09/12/2020   Procedure: REMOVAL OF BILATERAL  PELVIC SCREWS;  Surgeon: Phylliss Bob, MD;  Location: [place];  Service: Orthopedics;  Laterality: Bilateral;   SKIN CANCER EXCISION     face x3   TUBAL LIGATION     Patient Active Problem List   Diagnosis Date Noted   S/P shoulder replacement, right 07/03/2022   Allergic contact dermatitis due to other agents 04/13/2022   S/P lumbar microdiscectomy 04/30/2021   S/P hardware removal 09/12/2020   Radiculopathy 06/29/2019   Cervical radiculopathy 08/15/2018   Chronic sinusitis 07/27/2018   Constipation 07/27/2018   Chronic bilateral low back pain with bilateral sciatica 07/01/2018   Disorder of bone and cartilage 04/04/2018   Esophageal reflux 04/04/2018   Other and unspecified hyperlipidemia 04/04/2018   Other malaise and fatigue 04/04/2018   Pain in joint involving ankle and foot 04/04/2018   Pain in soft tissues of limb 04/04/2018   Posttraumatic stress disorder 04/04/2018   Raynaud's phenomenon 04/04/2018   Skin sensation disturbance 04/04/2018   Sleep disturbance 04/04/2018   Vitamin D deficiency 04/04/2018   Myoclonus, segmental 10/18/2017   Adjacent segment disease with spinal stenosis 10/04/2017   DDD (degenerative disc disease), lumbar 10/30/2016   Spondylolisthesis of lumbar region 08/21/2016  Overweight with body mass index (BMI) 25.0-29.9 07/22/2016   Capsulitis of metatarsophalangeal (MTP) joint of right foot 03/23/2016   Lumbar spinal stenosis 12/05/2015   Migraine 09/13/2012   Other allergic rhinitis 09/13/2012   Neuropathy 09/13/2012    PCP: Faustino Congress, NP   REFERRING PROVIDER: Izora Ribas, MD   REFERRING DIAG: M79.18 (ICD-10-CM) - Cervical myofascial pain syndrome   THERAPY DIAG:  Cervicalgia  Rationale for Evaluation and Treatment: Rehabilitation  ONSET DATE: 07-03-22  SUBJECTIVE:                                                                                                                                                                                                          SUBJECTIVE STATEMENT: Pt has seen Dr Ranell Patrick for trigger point injection on 07-27-22 and RTSR on     PERTINENT HISTORY:      L3-4 andL5-S1 PLIF, fibromyalgia, chronic pain, CA, OA,      CERVICAL ROM:   Active ROM A/PROM (deg) eval  Flexion   Extension   Right lateral flexion   Left lateral flexion   Right rotation   Left rotation    (Blank rows = not tested)  UPPER EXTREMITY ROM:  Active ROM Right eval Left eval  Shoulder flexion    Shoulder extension    Shoulder abduction    Shoulder adduction    Shoulder extension    Shoulder internal rotation    Shoulder external rotation    Elbow flexion    Elbow extension    Wrist flexion    Wrist extension    Wrist ulnar deviation    Wrist radial deviation    Wrist pronation    Wrist supination     (Blank rows = not tested)  UPPER EXTREMITY MMT:  MMT Right eval Left eval  Shoulder flexion    Shoulder extension    Shoulder abduction    Shoulder adduction    Shoulder extension    Shoulder internal rotation    Shoulder external rotation    Middle trapezius    Lower trapezius    Elbow flexion    Elbow extension    Wrist flexion    Wrist extension    Wrist ulnar deviation    Wrist radial deviation    Wrist pronation    Wrist supination    Grip strength     (Blank rows = not tested)   TODAY'S TREATMENT:  DATE: No charge , eval not completed     Voncille Lo, PT, [facility] Certified Exercise Expert for the Aging Adult  08/20/22 1:54 PM Phone: [phone] Fax: [phone]

## 2022-08-20 ENCOUNTER — Ambulatory Visit: Payer: Medicare HMO | Admitting: Physical Therapy

## 2022-08-20 NOTE — Therapy (Signed)
OUTPATIENT SPEECH LANGUAGE PATHOLOGY SWALLOW EVALUATION   Patient Name: Sharon Logan MRN: [id number] DOB:07-23-1955, 67 y.o., female Today's Date: 08/22/2022  PCP: Faustino Congress, NP REFERRING PROVIDER: Wyline Beady, PA-C   END OF SESSION:  End of Session - 08/22/22 2326     Visit Number 1    Number of Visits 12    Date for SLP Re-Evaluation 11/19/22    SLP Start Time 0803    SLP Stop Time  0846    SLP Time Calculation (min) 43 min    Activity Tolerance Patient tolerated treatment well             Past Medical History:  Diagnosis Date   Allergic rhinitis    Anxiety    Arthritis    Cancer ([place])    skin   Chronic pain    Chronically dry eyes    Depression    Fibromyalgia    GERD (gastroesophageal reflux disease)    Headache    Osteoporosis    Sleep apnea    Stroke ([place])    from gabapenting per pt no deficits   Vitamin D deficiency    Wears glasses    Past Surgical History:  Procedure Laterality Date   ABDOMINAL HYSTERECTOMY     BACK SURGERY     BUNIONECTOMY Bilateral    EYE SURGERY     laser surgery   NASAL SINUS SURGERY     x2   REVERSE SHOULDER ARTHROPLASTY Right 07/03/2022   Procedure: REVERSE SHOULDER ARTHROPLASTY;  Surgeon: Netta Cedars, MD;  Location: [facility];  Service: Orthopedics;  Laterality: Right;  120 min choice with interscalene block   SACROILIAC JOINT FUSION Bilateral 09/12/2020   Procedure: REMOVAL OF BILATERAL PELVIC SCREWS;  Surgeon: Phylliss Bob, MD;  Location: [place];  Service: Orthopedics;  Laterality: Bilateral;   SKIN CANCER EXCISION     face x3   TUBAL LIGATION     Patient Active Problem List   Diagnosis Date Noted   S/P shoulder replacement, right 07/03/2022   Allergic contact dermatitis due to other agents 04/13/2022   S/P lumbar microdiscectomy 04/30/2021   S/P hardware removal 09/12/2020   Radiculopathy 06/29/2019   Cervical radiculopathy 08/15/2018   Chronic sinusitis 07/27/2018   Constipation 07/27/2018    Chronic bilateral low back pain with bilateral sciatica 07/01/2018   Disorder of bone and cartilage 04/04/2018   Esophageal reflux 04/04/2018   Other and unspecified hyperlipidemia 04/04/2018   Other malaise and fatigue 04/04/2018   Pain in joint involving ankle and foot 04/04/2018   Pain in soft tissues of limb 04/04/2018   Posttraumatic stress disorder 04/04/2018   Raynaud's phenomenon 04/04/2018   Skin sensation disturbance 04/04/2018   Sleep disturbance 04/04/2018   Vitamin D deficiency 04/04/2018   Myoclonus, segmental 10/18/2017   Adjacent segment disease with spinal stenosis 10/04/2017   DDD (degenerative disc disease), lumbar 10/30/2016   Spondylolisthesis of lumbar region 08/21/2016   Overweight with body mass index (BMI) 25.0-29.9 07/22/2016   Capsulitis of metatarsophalangeal (MTP) joint of right foot 03/23/2016   Lumbar spinal stenosis 12/05/2015   Migraine 09/13/2012   Other allergic rhinitis 09/13/2012   Neuropathy 09/13/2012    ONSET DATE: November 2023   REFERRING DIAG: R13.10 (ICD-10-CM) - Dysphagia, unspecified   THERAPY DIAG:  Dysphagia, oropharyngeal phase - Plan: SLP plan of care cert/re-cert  Rationale for Evaluation and Treatment: Rehabilitation  SUBJECTIVE:   SUBJECTIVE STATEMENT: "That all makes sense." (Pt, when SLP explaining why need for HEP)  Pt accompanied by: self  PERTINENT HISTORY: Pt report of having difficulty swallowing foods since November 2023 with gustatory changes as well and xerstomia. Pt with PMH + for ? TIA *due to Gabapentin per ptA* resulting in transient left sided weakness that resolved completely, Raynaud's, skin cancer, Vit D deficiency, cervical radiculopathy, depression and fibromyalgia. Recurrent pneumonias. She reports coughing up just food at times. Pt denies issues with swallowing pills. PSH: reverse arthroplasty.   PAIN:  Are you having pain? No  FALLS: Has patient fallen in last 6 months?  No  LIVING  ENVIRONMENT: Lives with: lives alone Lives in: House/apartment  PLOF:  Level of assistance: Independent with ADLs, Independent with IADLs Employment: Retired  PATIENT GOALS: Improve swallow  OBJECTIVE:    RECOMMENDATIONS FROM OBJECTIVE SWALLOW STUDY (MBSS):  08/18/22 Objective swallow impairments: Patient presents with minimal pharyngeal impairments - c/b decreased laryngeal elevation and closure allowing inconsistent laryngeal penetration of thin liquids, most notably with sequential boluses. No aspiration observed. Barium tablet taken with thin momentarily halted at pyriform sinus *tested in A-P view* with pt sensation as she consumed more liquids to clear. Pt sensed tablet stalling but also sensed it cleared. At one time, she pointed to the area between left sternum and left clavicle sensing discomfort. SLP recommends pt continue diet with brief SLP treatment primarily to focus on laryngeal elevation/closure for airway protection. Recommend pt start all intake with liquids and consume liquids t/o meal to compensate for her known xerostomia. Advised pt to take her medication with food if problematic to swallow with liquids. Using video fluoroscopy loops, educated pt to findings/recommendations.  Objective recommended compensations:  PO Diet Recommendation: Regular; Thin liquids (Level 0) Liquid Administration via: Cup; Straw Medication Administration: Whole meds with liquid Supervision: Patient able to self-feed Swallowing strategies  : Slow rate; Small bites/sips (Start intake with liquids) Postural changes: Position pt fully upright for meals; Stay upright 30-60 min after meals Oral care recommendations: Oral care BID (2x/day)  COGNITION: Overall cognitive status: Within functional limits for tasks assessed  ORAL MOTOR EXAMINATION: Overall status: Impaired: Labial: Bilateral (Coordination) Lingual: Bilateral (Coordination) Velum: WNL Cough: WNL  CLINICAL SWALLOW ASSESSMENT:    Current diet: regular and thin liquids Dentition: adequate natural dentition Patient directly observed with POs: Yes: dysphagia 3 (soft) and thin liquids  Feeding: able to feed self Liquids provided by: cup Oral phase signs and symptoms: prolonged mastication; Pt reported xerostomia Pharyngeal phase signs and symptoms:  none She required cues to use water first strategy, as suggested in [facility] 08/18/22 .  PATIENT REPORTED OUTCOME MEASURES (PROM): EAT-10: to be completed next session   TODAY'S TREATMENT:                                                                                                                                         DATE:   08/21/22: SLP educated pt on results of MBS and  the recommendations from this exam and rationale for recommendations. SLP taught pt HEP for improving laryngeal elevation and closure.    PATIENT EDUCATION: Education details: see "today's treatment" Person educated: Patient Education method: Explanation, Demonstration, Verbal cues, and Handouts Education comprehension: verbalized understanding, returned demonstration, verbal cues required, and needs further education   ASSESSMENT:  CLINICAL IMPRESSION: Patient is a 67 y.o. F who was seen today for assessment of swallow skills following MBS on 08/18/22. Pt will work on [employer] and will work on strategies suggested at time of MBS. Today, pt did not follow precautions.   OBJECTIVE IMPAIRMENTS: include dysphagia. These impairments are limiting patient from safety when swallowing. Factors affecting potential to achieve goals and functional outcome are possibly ability to learn/carryover information, and possibly cooperation/participation level. Patient will benefit from skilled SLP services to address above impairments and improve overall function.  REHAB POTENTIAL: Good   GOALS: Goals reviewed with patient? Yes  SHORT TERM GOALS: Target date: 09/30/22  Pt will follow swallow preautions from MBS on  08/18/22 with modified independence in 2 sessions Baseline: Goal status: INITIAL  2.  Pt will demo HEP with modified independence in 2 sessions Baseline:  Goal status: INITIAL  3.  Pt will tell 3 overt s/sx PNA with mod I Baseline:  Goal status: INITIAL   LONG TERM GOALS: Target date: 11/20/22  Pt will follow swallow preautions from MBS on 08/18/22 with independence in 2 sessions Baseline:  Goal status: INITIAL  2.  Pt will demo HEP with independence in 3 sessions Baseline:  Goal status: INITIAL  3.  Pt will indicate greater success with swallowing as measured by EAT-10 Baseline:  Goal status: INITIAL   PLAN:  SLP FREQUENCY: 1-2x/week  SLP DURATION: 12 weeks  PLANNED INTERVENTIONS: Aspiration precaution training, Pharyngeal strengthening exercises, Diet toleration management , Environmental controls, Trials of upgraded texture/liquids, SLP instruction and feedback, Compensatory strategies, and Patient/family education    [facility], [place] 08/22/2022, 11:43 PM

## 2022-08-21 ENCOUNTER — Ambulatory Visit: Payer: No Typology Code available for payment source | Attending: Family Medicine

## 2022-08-21 ENCOUNTER — Ambulatory Visit: Payer: No Typology Code available for payment source

## 2022-08-21 DIAGNOSIS — R1312 Dysphagia, oropharyngeal phase: Secondary | ICD-10-CM | POA: Insufficient documentation

## 2022-08-21 NOTE — Patient Instructions (Signed)
SWALLOWING EXERCISES Do these until May 15th, then 2-3 times per week afterwards  Effortful Swallows - Press your tongue against the roof of your mouth for 3 seconds, then squeeze the muscles in your neck while you swallow your saliva or a sip of water - Repeat 15 times, 2-3 times a day, and use whenever you eat or drink  Shaker Exercise - head lift - Lie flat on your back in your bed or on a couch without pillows - Raise your head and look at your feet - KEEP YOUR SHOULDERS DOWN - HOLD FOR 45-60 SECONDS, then lower your head back down - Repeat 3 times, 2-3 times a day  [employer] - "half swallow" exercise - Begin a regular swallow - As you feel your larynx move in an upward direction, squeeze your throat muscles for 5 seconds - Relax and finish the swallow - Repeat 15 times, 2-3 times a day *use a wet spoon if your mouth gets dry*       4.  "Super Swallow"  - Take a breath and hold it  - Swallow then IMMEDIATELY cough  - Repeat 15 times, 2-3 times a day   ===================================================================== 10 Deceiving New Names for High-Fructose Corn Syrup The corn industry continues to defend high-fructose corn syrup by spending millions on commercials. In their advertising campaigns, they claim that medical and nutrition experts have determined it is not unhealthy. That it is "no different than regular sugar". Except, it is.   Because the name has such a bad rep, many food manufacturers utilize these names instead so that people don't question and purchase the product:  1. Maize syrup 2. Glucose syrup 3. Glucose-fructose syrup 4. Tapioca syrup 5. Fruit fructose 6. Crystalline fructose 7. HFCS (the same name, just the shortened version) 8. Isoglucose 9. Corn syrup 10. Dahlia syrup

## 2022-08-24 ENCOUNTER — Other Ambulatory Visit (HOSPITAL_COMMUNITY): Payer: Self-pay

## 2022-08-25 ENCOUNTER — Ambulatory Visit: Payer: Medicare HMO | Admitting: Psychiatry

## 2022-08-26 DIAGNOSIS — T849XXA Unspecified complication of internal orthopedic prosthetic device, implant and graft, initial encounter: Secondary | ICD-10-CM | POA: Diagnosis not present

## 2022-08-26 DIAGNOSIS — M25551 Pain in right hip: Secondary | ICD-10-CM | POA: Diagnosis not present

## 2022-08-26 DIAGNOSIS — M961 Postlaminectomy syndrome, not elsewhere classified: Secondary | ICD-10-CM | POA: Diagnosis not present

## 2022-08-26 DIAGNOSIS — Z4789 Encounter for other orthopedic aftercare: Secondary | ICD-10-CM | POA: Diagnosis not present

## 2022-08-26 DIAGNOSIS — M7740 Metatarsalgia, unspecified foot: Secondary | ICD-10-CM | POA: Diagnosis not present

## 2022-08-26 DIAGNOSIS — M5136 Other intervertebral disc degeneration, lumbar region: Secondary | ICD-10-CM | POA: Diagnosis not present

## 2022-08-26 DIAGNOSIS — M542 Cervicalgia: Secondary | ICD-10-CM | POA: Diagnosis not present

## 2022-08-26 DIAGNOSIS — M533 Sacrococcygeal disorders, not elsewhere classified: Secondary | ICD-10-CM | POA: Diagnosis not present

## 2022-08-27 ENCOUNTER — Ambulatory Visit: Payer: Medicare HMO | Admitting: Psychiatry

## 2022-08-27 ENCOUNTER — Ambulatory Visit: Payer: No Typology Code available for payment source

## 2022-08-27 DIAGNOSIS — R1312 Dysphagia, oropharyngeal phase: Secondary | ICD-10-CM

## 2022-08-27 NOTE — Therapy (Signed)
OUTPATIENT SPEECH LANGUAGE PATHOLOGY SWALLOW THERAPY   Patient Name: Sharon Logan MRN: [id number] DOB:August 11, 1955, 67 y.o., female Today's Date: 08/27/2022  PCP: Faustino Congress, NP REFERRING PROVIDER: Wyline Beady, PA-C   END OF SESSION:  End of Session - 08/27/22 1456     Visit Number 2    Number of Visits 12    Date for SLP Re-Evaluation 11/19/22    SLP Start Time 1452    SLP Stop Time  1530    SLP Time Calculation (min) 38 min    Activity Tolerance Patient tolerated treatment well              Past Medical History:  Diagnosis Date   Allergic rhinitis    Anxiety    Arthritis    Cancer ([place])    skin   Chronic pain    Chronically dry eyes    Depression    Fibromyalgia    GERD (gastroesophageal reflux disease)    Headache    Osteoporosis    Sleep apnea    Stroke ([place])    from gabapenting per pt no deficits   Vitamin D deficiency    Wears glasses    Past Surgical History:  Procedure Laterality Date   ABDOMINAL HYSTERECTOMY     BACK SURGERY     BUNIONECTOMY Bilateral    EYE SURGERY     laser surgery   NASAL SINUS SURGERY     x2   REVERSE SHOULDER ARTHROPLASTY Right 07/03/2022   Procedure: REVERSE SHOULDER ARTHROPLASTY;  Surgeon: Netta Cedars, MD;  Location: [facility];  Service: Orthopedics;  Laterality: Right;  120 min choice with interscalene block   SACROILIAC JOINT FUSION Bilateral 09/12/2020   Procedure: REMOVAL OF BILATERAL PELVIC SCREWS;  Surgeon: Phylliss Bob, MD;  Location: [place];  Service: Orthopedics;  Laterality: Bilateral;   SKIN CANCER EXCISION     face x3   TUBAL LIGATION     Patient Active Problem List   Diagnosis Date Noted   S/P shoulder replacement, right 07/03/2022   Allergic contact dermatitis due to other agents 04/13/2022   S/P lumbar microdiscectomy 04/30/2021   S/P hardware removal 09/12/2020   Radiculopathy 06/29/2019   Cervical radiculopathy 08/15/2018   Chronic sinusitis 07/27/2018   Constipation 07/27/2018    Chronic bilateral low back pain with bilateral sciatica 07/01/2018   Disorder of bone and cartilage 04/04/2018   Esophageal reflux 04/04/2018   Other and unspecified hyperlipidemia 04/04/2018   Other malaise and fatigue 04/04/2018   Pain in joint involving ankle and foot 04/04/2018   Pain in soft tissues of limb 04/04/2018   Posttraumatic stress disorder 04/04/2018   Raynaud's phenomenon 04/04/2018   Skin sensation disturbance 04/04/2018   Sleep disturbance 04/04/2018   Vitamin D deficiency 04/04/2018   Myoclonus, segmental 10/18/2017   Adjacent segment disease with spinal stenosis 10/04/2017   DDD (degenerative disc disease), lumbar 10/30/2016   Spondylolisthesis of lumbar region 08/21/2016   Overweight with body mass index (BMI) 25.0-29.9 07/22/2016   Capsulitis of metatarsophalangeal (MTP) joint of right foot 03/23/2016   Lumbar spinal stenosis 12/05/2015   Migraine 09/13/2012   Other allergic rhinitis 09/13/2012   Neuropathy 09/13/2012    ONSET DATE: November 2023   REFERRING DIAG: R13.10 (ICD-10-CM) - Dysphagia, unspecified   THERAPY DIAG:  Dysphagia, oropharyngeal phase  Rationale for Evaluation and Treatment: Rehabilitation  SUBJECTIVE:   SUBJECTIVE STATEMENT: "I don't think I did this Mendelsohn right." Pt accompanied by: self  PERTINENT HISTORY: Pt report of  having difficulty swallowing foods since November 2023 with gustatory changes as well and xerstomia. Pt with PMH + for ? TIA *due to Gabapentin per ptA* resulting in transient left sided weakness that resolved completely, Raynaud's, skin cancer, Vit D deficiency, cervical radiculopathy, depression and fibromyalgia. Recurrent pneumonias. She reports coughing up just food at times. Pt denies issues with swallowing pills. PSH: reverse arthroplasty.   PAIN:  Are you having pain? Yes: NPRS scale: 6/10 Pain location: neck/shoulders Pain description: ache, burning pain Aggravating factors: neck movement Relieving  factors: being still  PATIENT GOALS: Improve swallow  OBJECTIVE:    RECOMMENDATIONS FROM OBJECTIVE SWALLOW STUDY (MBSS):  08/18/22 Objective swallow impairments: Patient presents with minimal pharyngeal impairments - c/b decreased laryngeal elevation and closure allowing inconsistent laryngeal penetration of thin liquids, most notably with sequential boluses. No aspiration observed. Barium tablet taken with thin momentarily halted at pyriform sinus *tested in A-P view* with pt sensation as she consumed more liquids to clear. Pt sensed tablet stalling but also sensed it cleared. At one time, she pointed to the area between left sternum and left clavicle sensing discomfort. SLP recommends pt continue diet with brief SLP treatment primarily to focus on laryngeal elevation/closure for airway protection. Recommend pt start all intake with liquids and consume liquids t/o meal to compensate for her known xerostomia. Advised pt to take her medication with food if problematic to swallow with liquids. Using video fluoroscopy loops, educated pt to findings/recommendations.  Objective recommended compensations:  PO Diet Recommendation: Regular; Thin liquids (Level 0) Liquid Administration via: Cup; Straw Medication Administration: Whole meds with liquid Supervision: Patient able to self-feed Swallowing strategies  : Slow rate; Small bites/sips (Start intake with liquids) Postural changes: Position pt fully upright for meals; Stay upright 30-60 min after meals Oral care recommendations: Oral care BID (2x/day) .  PATIENT REPORTED OUTCOME MEASURES (PROM): EAT-10: to be completed next session   TODAY'S TREATMENT:                                                                                                                                         DATE:   08/27/22: Pt needs EAT-10. "That's starting to be a forefront thing now - make sure I have that water first." Pt followed precautions with POs today with  independence. HEP was completed with excellent success and initial rare min A faded to independent.   08/21/22 (eval): SLP educated pt on results of MBS and the recommendations from this exam and rationale for recommendations. SLP taught pt HEP for improving laryngeal elevation and closure.    PATIENT EDUCATION: Education details: see "today's treatment" Person educated: Patient Education method: Explanation, Demonstration, Verbal cues, and Handouts Education comprehension: verbalized understanding, returned demonstration, verbal cues required, and needs further education   ASSESSMENT:  CLINICAL IMPRESSION: Patient is a 67 y.o. F who was seen today for assessment of swallow skills following MBS on  08/18/22. Pt will work on [employer] and will work on strategies suggested at time of MBS. Today, pt did not follow precautions.   OBJECTIVE IMPAIRMENTS: include dysphagia. These impairments are limiting patient from safety when swallowing. Factors affecting potential to achieve goals and functional outcome are possibly ability to learn/carryover information, and possibly cooperation/participation level. Patient will benefit from skilled SLP services to address above impairments and improve overall function.  REHAB POTENTIAL: Good   GOALS: Goals reviewed with patient? Yes  SHORT TERM GOALS: Target date: 09/30/22  Pt will follow swallow preautions from MBS on 08/18/22 with modified independence in 2 sessions Baseline:08/27/22 Goal status: Ongoing  2.  Pt will demo HEP with modified independence in 2 sessions Baseline:  Goal status: Ongoing  3.  Pt will tell 3 overt s/sx PNA with mod I Baseline:  Goal status: Ongoing   LONG TERM GOALS: Target date: 11/20/22  Pt will follow swallow preautions from IXL on 08/18/22 with independence in 2 sessions Baseline: 08/27/22 Goal status: Ongoing  2.  Pt will demo HEP with independence in 3 sessions Baseline:  Goal status: Ongoing  3.  Pt will indicate  greater success with swallowing as measured by EAT-10 Baseline:  Goal status: Ongoing   PLAN:  SLP FREQUENCY: 1-2x/week  SLP DURATION: 12 weeks  PLANNED INTERVENTIONS: Aspiration precaution training, Pharyngeal strengthening exercises, Diet toleration management , Environmental controls, Trials of upgraded texture/liquids, SLP instruction and feedback, Compensatory strategies, and Patient/family education    [facility], [place] 08/27/2022, 2:57 PM

## 2022-09-01 ENCOUNTER — Ambulatory Visit: Payer: No Typology Code available for payment source

## 2022-09-01 ENCOUNTER — Ambulatory Visit (INDEPENDENT_AMBULATORY_CARE_PROVIDER_SITE_OTHER): Payer: Medicare HMO | Admitting: Psychiatry

## 2022-09-01 ENCOUNTER — Encounter: Payer: Self-pay | Admitting: Psychiatry

## 2022-09-01 DIAGNOSIS — R1312 Dysphagia, oropharyngeal phase: Secondary | ICD-10-CM | POA: Diagnosis not present

## 2022-09-01 DIAGNOSIS — F4310 Post-traumatic stress disorder, unspecified: Secondary | ICD-10-CM | POA: Diagnosis not present

## 2022-09-01 DIAGNOSIS — G4700 Insomnia, unspecified: Secondary | ICD-10-CM

## 2022-09-01 DIAGNOSIS — F339 Major depressive disorder, recurrent, unspecified: Secondary | ICD-10-CM

## 2022-09-01 MED ORDER — BUSPIRONE HCL 15 MG PO TABS: ORAL_TABLET | ORAL | 1 refills | Status: DC

## 2022-09-01 MED ORDER — MIRTAZAPINE 15 MG PO TABS: 15.0000 mg | ORAL_TABLET | Freq: Every day | ORAL | 1 refills | Status: DC

## 2022-09-01 MED ORDER — DULOXETINE HCL 30 MG PO CPEP: 30.0000 mg | ORAL_CAPSULE | Freq: Every day | ORAL | 1 refills | Status: DC

## 2022-09-01 NOTE — Therapy (Signed)
OUTPATIENT SPEECH LANGUAGE PATHOLOGY SWALLOW THERAPY   Patient Name: Sharon Logan MRN: [id number] DOB:03-Mar-1956, 67 y.o., female Today's Date: 09/01/2022  PCP: Faustino Congress, NP REFERRING PROVIDER: Wyline Beady, PA-C   END OF SESSION:  End of Session - 09/01/22 1414     Visit Number 3    Number of Visits 12    Date for SLP Re-Evaluation 11/19/22    SLP Start Time 1408    SLP Stop Time  [id number]    SLP Time Calculation (min) 37 min    Activity Tolerance Patient tolerated treatment well              Past Medical History:  Diagnosis Date   Allergic rhinitis    Anxiety    Arthritis    Cancer ([place])    skin   Chronic pain    Chronically dry eyes    Depression    Fibromyalgia    GERD (gastroesophageal reflux disease)    Headache    Osteoporosis    Sleep apnea    Stroke ([place])    from gabapenting per pt no deficits   Vitamin D deficiency    Wears glasses    Past Surgical History:  Procedure Laterality Date   ABDOMINAL HYSTERECTOMY     BACK SURGERY     BUNIONECTOMY Bilateral    EYE SURGERY     laser surgery   NASAL SINUS SURGERY     x2   REVERSE SHOULDER ARTHROPLASTY Right 07/03/2022   Procedure: REVERSE SHOULDER ARTHROPLASTY;  Surgeon: Netta Cedars, MD;  Location: [facility];  Service: Orthopedics;  Laterality: Right;  120 min choice with interscalene block   SACROILIAC JOINT FUSION Bilateral 09/12/2020   Procedure: REMOVAL OF BILATERAL PELVIC SCREWS;  Surgeon: Phylliss Bob, MD;  Location: [facility];  Service: Orthopedics;  Laterality: Bilateral;   SKIN CANCER EXCISION     face x3   TUBAL LIGATION     Patient Active Problem List   Diagnosis Date Noted   S/P shoulder replacement, right 07/03/2022   Allergic contact dermatitis due to other agents 04/13/2022   S/P lumbar microdiscectomy 04/30/2021   S/P hardware removal 09/12/2020   Radiculopathy 06/29/2019   Cervical radiculopathy 08/15/2018   Chronic sinusitis 07/27/2018   Constipation 07/27/2018    Chronic bilateral low back pain with bilateral sciatica 07/01/2018   Disorder of bone and cartilage 04/04/2018   Esophageal reflux 04/04/2018   Other and unspecified hyperlipidemia 04/04/2018   Other malaise and fatigue 04/04/2018   Pain in joint involving ankle and foot 04/04/2018   Pain in soft tissues of limb 04/04/2018   Posttraumatic stress disorder 04/04/2018   Raynaud's phenomenon 04/04/2018   Skin sensation disturbance 04/04/2018   Sleep disturbance 04/04/2018   Vitamin D deficiency 04/04/2018   Myoclonus, segmental 10/18/2017   Adjacent segment disease with spinal stenosis 10/04/2017   DDD (degenerative disc disease), lumbar 10/30/2016   Spondylolisthesis of lumbar region 08/21/2016   Overweight with body mass index (BMI) 25.0-29.9 07/22/2016   Capsulitis of metatarsophalangeal (MTP) joint of right foot 03/23/2016   Lumbar spinal stenosis 12/05/2015   Migraine 09/13/2012   Other allergic rhinitis 09/13/2012   Neuropathy 09/13/2012    ONSET DATE: November 2023   REFERRING DIAG: R13.10 (ICD-10-CM) - Dysphagia, unspecified   THERAPY DIAG:  Dysphagia, oropharyngeal phase  Rationale for Evaluation and Treatment: Rehabilitation  SUBJECTIVE:   SUBJECTIVE STATEMENT: "I didn't really do that one(Supraglottic)." Pt accompanied by: self  PERTINENT HISTORY: Pt report of having difficulty  swallowing foods since November 2023 with gustatory changes as well and xerstomia. Pt with PMH + for ? TIA *due to Gabapentin per ptA* resulting in transient left sided weakness that resolved completely, Raynaud's, skin cancer, Vit D deficiency, cervical radiculopathy, depression and fibromyalgia. Recurrent pneumonias. She reports coughing up just food at times. Pt denies issues with swallowing pills. PSH: reverse arthroplasty.   PAIN:  Are you having pain? Yes: NPRS scale: 6/10 Pain location: neck/shoulders Pain description: ache, burning pain Aggravating factors: neck movement Relieving  factors: being still  PATIENT GOALS: Improve swallow  OBJECTIVE:    RECOMMENDATIONS FROM OBJECTIVE SWALLOW STUDY (MBSS):  08/18/22 Objective swallow impairments: Patient presents with minimal pharyngeal impairments - c/b decreased laryngeal elevation and closure allowing inconsistent laryngeal penetration of thin liquids, most notably with sequential boluses. No aspiration observed. Barium tablet taken with thin momentarily halted at pyriform sinus *tested in A-P view* with pt sensation as she consumed more liquids to clear. Pt sensed tablet stalling but also sensed it cleared. At one time, she pointed to the area between left sternum and left clavicle sensing discomfort. SLP recommends pt continue diet with brief SLP treatment primarily to focus on laryngeal elevation/closure for airway protection. Recommend pt start all intake with liquids and consume liquids t/o meal to compensate for her known xerostomia. Advised pt to take her medication with food if problematic to swallow with liquids. Using video fluoroscopy loops, educated pt to findings/recommendations.  Objective recommended compensations:  PO Diet Recommendation: Regular; Thin liquids (Level 0) Liquid Administration via: Cup; Straw Medication Administration: Whole meds with liquid Supervision: Patient able to self-feed Swallowing strategies  : Slow rate; Small bites/sips (Start intake with liquids) Postural changes: Position pt fully upright for meals; Stay upright 30-60 min after meals Oral care recommendations: Oral care BID (2x/day) .  PATIENT REPORTED OUTCOME MEASURES (PROM): EAT-10: completed 09/01/22 with 3/40 (lower scores = better QOL).    TODAY'S TREATMENT:                                                                                                                                         DATE:   09/01/22: EAT-10 completed today. Pt performed HEP with rare min A for supraglottic. Pt admittedly stated she was not performing  that exercise and SLP provided rationale and encouraged pt to complete. She agreed. Adelia said she was having sips water prior to meals.   08/27/22: Pt needs EAT-10. "That's starting to be a forefront thing now - make sure I have that water first." Pt followed precautions with POs today with independence. HEP was completed with excellent success and initial rare min A faded to independent.   08/21/22 (eval): SLP educated pt on results of MBS and the recommendations from this exam and rationale for recommendations. SLP taught pt HEP for improving laryngeal elevation and closure.    PATIENT EDUCATION: Education details: see "today's treatment"  Person educated: Patient Education method: Explanation, Demonstration, Verbal cues, and Handouts Education comprehension: verbalized understanding, returned demonstration, verbal cues required, and needs further education   ASSESSMENT:  CLINICAL IMPRESSION: Patient is a 67 y.o. F who was seen today for treatment of swallow skills following MBS on 08/18/22. Pt will work on [employer] and will work on strategies suggested at time of MBS.   OBJECTIVE IMPAIRMENTS: include dysphagia. These impairments are limiting patient from safety when swallowing. Factors affecting potential to achieve goals and functional outcome are possibly ability to learn/carryover information, and possibly cooperation/participation level. Patient will benefit from skilled SLP services to address above impairments and improve overall function.  REHAB POTENTIAL: Good   GOALS: Goals reviewed with patient? Yes  SHORT TERM GOALS: Target date: 09/30/22  Pt will follow swallow preautions from MBS on 08/18/22 with modified independence in 2 sessions Baseline:08/27/22 Goal status: Ongoing  2.  Pt will demo HEP with modified independence in 2 sessions Baseline:  Goal status: Ongoing  3.  Pt will tell 3 overt s/sx PNA with mod I Baseline:  Goal status: Ongoing   LONG TERM GOALS: Target date:  11/20/22  Pt will follow swallow preautions from [place] on 08/18/22 with independence in 2 sessions Baseline: 08/27/22 Goal status: Ongoing  2.  Pt will demo HEP with independence in 3 sessions Baseline:  Goal status: Ongoing  3.  Pt will indicate greater success with swallowing as measured by EAT-10 Baseline:  Goal status: Ongoing   PLAN:  SLP FREQUENCY: 1-2x/week  SLP DURATION: 12 weeks  PLANNED INTERVENTIONS: Aspiration precaution training, Pharyngeal strengthening exercises, Diet toleration management , Environmental controls, Trials of upgraded texture/liquids, SLP instruction and feedback, Compensatory strategies, and Patient/family education    [facility], [place] 09/01/2022, 2:15 PM

## 2022-09-01 NOTE — Progress Notes (Signed)
Sharon Logan [id number] March 16, 1956 67 y.o.  Subjective:   Patient ID:  Sharon Logan is a 67 y.o. (DOB 09-Aug-1955) female.  Chief Complaint:  Chief Complaint  Patient presents with   Depression   Anxiety    HPI [place] presents to the office today for follow-up of ***  She has been using a cPap and a few days it "seemed like it worked" and did not need a nap, then the last few days she has needed a nap. She reports that she is adjusting to cPap and different masks.   She reports that she has depression in response to health issues. She reports that she has had "bouts of anxiety." She denies panic attacks. She reports that she has been depressed and "not very patient." She reports that her energy is low. Motivation has been low. Diminished interest in things. She reports difficulty with concentration. She reports that she has been missing some appointments despite using calendars, reminders, etc. Anhedonia. "Going through the motions is all I am doing." Denies SI.   She reports difficulty falling asleep and that it takes at least 2 hours to fall asleep.  She reports that she can sleep 4-5 hours consecutively once she falls asleep. Sometimes is not able to return to sleep.   Wants to take a trip to [place] in early June.   Continuing  to see Sharon Logan, [facility] for therapy.   Past Medication Trials: Cymbalta- Started after back surgery. Has made her tired, even when reduced to 30 mg. Was having to take 2 hour naps. May have helped slightly with pain. Had HA, Constipation, Nausea Savella- muscle aches, rhabdomyolysis Effexor-HA, helped with excessive sweating.  Pristiq- May have increased depression. Prozac- Constipation. Causes her to feel cold.  Celexa- Worsening depression at 20 mg dose Lexapro- "My anti-depressant of choice" and gives her energy. Was taking 2-3 years prior to surgery. Re-started one week ago. Has taken 10 mg and 20 mg doses in the past. Thinks it may have  helped with pain in the past. Zoloft- worsening mood Paxil Viibryd- Felt depression was more "heavy." Wellbutrin- Did not cause HA's. Tolerated well and was effective for depression. Amitriptyline- Did not cause HA's. Weight gain. Effective.  Nortriptyline- Did not cause HA's. Effective. Lamictal- Rash Gabapentin -Rhabomyolysis Lyrica- Adverse reaction Buspar- Helpful for anxiety in the past. Well-tolerated. Trazodone- helped with sleep initiation but sleep was not restful. Severe dry mouth. Ambien- Parasomnia Lunesta- Tolerated, effective at 2 mg dose Remeron- Dry eyes, internal restlessness. Stomach discomfort.  Clonidine Topamax- ineffective for headaches. Caused cognitive side effects.  Hydroxyzine- Initially helped with anxiety and sleep initiation, then no longer as helpful       PHQ2-9    [place] Visit from 07/27/2022 in [place] Visit from 05/25/2022 in [place] Visit from 10/06/2021 in [place] Visit from 03/25/2021 in [place] Visit from 12/24/2020 in [place]  PHQ-2 Total Score 2 0 0 0 2      Flowsheet Row Admission (Discharged) from 07/03/2022 in [facility] from 06/18/2022 in [place] ED from 10/12/2021 in [facility] Emergency Department at [place] No Risk No Risk No Risk        Review of Systems:  Review of Systems  Gastrointestinal:  Negative.   Musculoskeletal:  Positive for arthralgias, back pain and myalgias. Negative for gait problem.  Neurological:  Negative for tremors.  Psychiatric/Behavioral:         Please refer to HPI    Medications: {medication reviewed/display:3041432}  Current Outpatient Medications   Medication Sig Dispense Refill   acetaminophen (TYLENOL) 325 MG tablet Take 650 mg by mouth every 6 (six) hours as needed for moderate pain.     amLODipine (NORVASC) 5 MG tablet Take 5 mg by mouth daily.     baclofen (LIORESAL) 10 MG tablet Take 10 mg by mouth 3 (three) times daily.     busPIRone (BUSPAR) 15 MG tablet Take 1/3 tablet p.o. twice daily for 1 week, then take 2/3 tablet p.o. twice daily for 1 week, then take 1 tablet p.o. twice daily 60 tablet 1   Carboxymethylcellulose Sodium (THERATEARS OP) Place 1 drop into both eyes 4 (four) times daily as needed (dry eyes).     cetirizine (ZYRTEC) 10 MG tablet Take 10 mg by mouth daily as needed for allergies.     diclofenac Sodium (VOLTAREN) 1 % GEL Apply 2 g topically daily as needed (pain).     fluticasone (FLONASE) 50 MCG/ACT nasal spray Place 2 sprays into both nostrils 2 (two) times daily as needed for allergies or rhinitis.     lidocaine (LIDODERM) 5 % Place 1 patch onto the skin daily.     magnesium 30 MG tablet Take 30 mg by mouth at bedtime.     saccharomyces boulardii (FLORASTOR) 250 MG capsule Take 250 mg by mouth daily.     sulindac (CLINORIL) 150 MG tablet Take 150 mg by mouth 2 (two) times daily.     Vilazodone HCl (VIIBRYD) 10 MG TABS Take 1/2 tablet daily with breakfast for one week, then increase to 1 tablet daily with breakfast (Patient taking differently: 5 mg. Take 1/2 tablet daily with breakfast for one week, then increase to 1 tablet daily with breakfast) 30 tablet 1   XTAMPZA ER 13.5 MG C12A Take 13.5 mg by mouth every 12 (twelve) hours.     DULoxetine (CYMBALTA) 30 MG capsule Take 1 capsule (30 mg total) by mouth daily. 30 capsule 1   EPINEPHrine 0.3 mg/0.3 mL IJ SOAJ injection Inject 0.3 mg into the muscle as needed for anaphylaxis.     mirtazapine (REMERON) 15 MG tablet Take 1 tablet (15 mg total) by mouth at bedtime. 30 tablet 1   rizatriptan (MAXALT) 10 MG tablet Take 10 mg by mouth daily as needed for migraine.       No current facility-administered medications for this visit.    Medication Side Effects: {Medication Side Effects (Optional):21014029}  Allergies:  Allergies  Allergen Reactions   Gabapentin Other (See Comments)    Delirium, stroke like symptoms  Other Reaction(s): Myalgia, Other  Has stroke symptoms when taking this with percocet.     Other reaction(s): Delirium    Delirium, stroke like symptoms   Amitriptyline Other (See Comments)   Amlodipine Other (See Comments)    headaches   Polymyxin B Other (See Comments)    Eyes go blood red    Pregabalin Itching and Rash    Itchy red rash on chest  Other Reaction(s): other   Zolpidem Rash    "sleep walk"    Cymbalta [Duloxetine Hcl] Other (See Comments)    Headaches, constipation   Fluoxetine Other (See Comments)    CONSTIPATION    Lamotrigine Rash   Other Other (  See Comments)    OTOBIOTIC > RED EYES    Past Medical History:  Diagnosis Date   Allergic rhinitis    Anxiety    Arthritis    Cancer ([place])    skin   Chronic pain    Chronically dry eyes    Depression    Fibromyalgia    GERD (gastroesophageal reflux disease)    Headache    Osteoporosis    Sleep apnea    Stroke ([place])    from gabapenting per pt no deficits   Vitamin D deficiency    Wears glasses     Past Medical History, Surgical history, Social history, and Family history were reviewed and updated as appropriate.   Please see review of systems for further details on the patient's review from today.   Objective:   Physical Exam:  There were no vitals taken for this visit.  Physical Exam  Lab Review:     Component Value Date/Time   NA 140 07/04/2022 0407   NA 142 06/03/2021 1623   K 3.9 07/04/2022 0407   CL 109 07/04/2022 0407   CO2 26 07/04/2022 0407   GLUCOSE 95 07/04/2022 0407   BUN 15 07/04/2022 0407   BUN 21 06/03/2021 1623   CREATININE 0.73 07/04/2022 0407   CALCIUM 8.1 (L) 07/04/2022 0407   PROT 7.4 04/21/2021 0910    ALBUMIN 3.8 04/21/2021 0910   AST 26 04/21/2021 0910   ALT 21 04/21/2021 0910   ALKPHOS 120 04/21/2021 0910   BILITOT 0.7 04/21/2021 0910   GFRNONAA >60 07/04/2022 0407   GFRAA >60 06/26/2019 1008       Component Value Date/Time   WBC 7.0 06/18/2022 1036   RBC 4.71 06/18/2022 1036   HGB 10.8 (L) 07/04/2022 0407   HGB 12.1 06/03/2021 1622   HCT 32.8 (L) 07/04/2022 0407   HCT 37.6 06/03/2021 1622   PLT 294 06/18/2022 1036   PLT 336 06/03/2021 1622   MCV 94.5 06/18/2022 1036   MCV 92 06/03/2021 1622   MCH 29.9 06/18/2022 1036   MCHC 31.7 06/18/2022 1036   RDW 12.8 06/18/2022 1036   RDW 12.8 06/03/2021 1622   LYMPHSABS 2.0 06/03/2021 1622   MONOABS 0.5 04/21/2021 0910   EOSABS 0.1 06/03/2021 1622   BASOSABS 0.0 06/03/2021 1622    No results found for: "POCLITH", "LITHIUM"   No results found for: "PHENYTOIN", "PHENOBARB", "VALPROATE", "CBMZ"   .res Assessment: Plan:    Eara was seen today for depression and anxiety.  Diagnoses and all orders for this visit:  PTSD (post-traumatic stress disorder) -     DULoxetine (CYMBALTA) 30 MG capsule; Take 1 capsule (30 mg total) by mouth daily. -     busPIRone (BUSPAR) 15 MG tablet; Take 1/3 tablet p.o. twice daily for 1 week, then take 2/3 tablet p.o. twice daily for 1 week, then take 1 tablet p.o. twice daily -     mirtazapine (REMERON) 15 MG tablet; Take 1 tablet (15 mg total) by mouth at bedtime.  Major depression, recurrent, chronic (HCC) -     DULoxetine (CYMBALTA) 30 MG capsule; Take 1 capsule (30 mg total) by mouth daily. -     mirtazapine (REMERON) 15 MG tablet; Take 1 tablet (15 mg total) by mouth at bedtime.     Please see After Visit Summary for patient specific instructions.  Future Appointments  Date Time Provider [place]  09/01/2022  2:00 PM Cori Razor [facility]  09/28/2022 10:40 AM  Izora Ribas, MD [facility] CPR  10/07/2022  9:30 AM Pieter Partridge, DO [facility] None  11/27/2022  11:40 AM Raulkar, Clide Deutscher, MD [facility] CPR    No orders of the defined types were placed in this encounter.   -------------------------------

## 2022-09-07 ENCOUNTER — Ambulatory Visit: Payer: No Typology Code available for payment source

## 2022-09-07 DIAGNOSIS — R1312 Dysphagia, oropharyngeal phase: Secondary | ICD-10-CM | POA: Diagnosis not present

## 2022-09-07 NOTE — Therapy (Signed)
OUTPATIENT SPEECH LANGUAGE PATHOLOGY SWALLOW THERAPY   Patient Name: Sharon Logan MRN: [id number] DOB:May 17, 1956, 67 y.o., female Today's Date: 09/07/2022  PCP: Sharon Congress, NP REFERRING PROVIDER: Wyline Beady, PA-C   END OF SESSION:     Past Medical History:  Diagnosis Date   Allergic rhinitis    Anxiety    Arthritis    Cancer ([place])    skin   Chronic pain    Chronically dry eyes    Depression    Fibromyalgia    GERD (gastroesophageal reflux disease)    Headache    Osteoporosis    Sleep apnea    Stroke ([place])    from gabapenting per pt no deficits   Vitamin D deficiency    Wears glasses    Past Surgical History:  Procedure Laterality Date   ABDOMINAL HYSTERECTOMY     BACK SURGERY     BUNIONECTOMY Bilateral    EYE SURGERY     laser surgery   NASAL SINUS SURGERY     x2   REVERSE SHOULDER ARTHROPLASTY Right 07/03/2022   Procedure: REVERSE SHOULDER ARTHROPLASTY;  Surgeon: Sharon Cedars, MD;  Location: [facility];  Service: Orthopedics;  Laterality: Right;  120 min choice with interscalene block   SACROILIAC JOINT FUSION Bilateral 09/12/2020   Procedure: REMOVAL OF BILATERAL PELVIC SCREWS;  Surgeon: Sharon Bob, MD;  Location: [facility];  Service: Orthopedics;  Laterality: Bilateral;   SKIN CANCER EXCISION     face x3   TUBAL LIGATION     Patient Active Problem List   Diagnosis Date Noted   S/P shoulder replacement, right 07/03/2022   Allergic contact dermatitis due to other agents 04/13/2022   S/P lumbar microdiscectomy 04/30/2021   S/P hardware removal 09/12/2020   Radiculopathy 06/29/2019   Cervical radiculopathy 08/15/2018   Chronic sinusitis 07/27/2018   Constipation 07/27/2018   Chronic bilateral low back pain with bilateral sciatica 07/01/2018   Disorder of bone and cartilage 04/04/2018   Esophageal reflux 04/04/2018   Other and unspecified hyperlipidemia 04/04/2018   Other malaise and fatigue 04/04/2018   Pain in joint involving ankle and  foot 04/04/2018   Pain in soft tissues of limb 04/04/2018   Posttraumatic stress disorder 04/04/2018   Raynaud's phenomenon 04/04/2018   Skin sensation disturbance 04/04/2018   Sleep disturbance 04/04/2018   Vitamin D deficiency 04/04/2018   Myoclonus, segmental 10/18/2017   Adjacent segment disease with spinal stenosis 10/04/2017   DDD (degenerative disc disease), lumbar 10/30/2016   Spondylolisthesis of lumbar region 08/21/2016   Overweight with body mass index (BMI) 25.0-29.9 07/22/2016   Capsulitis of metatarsophalangeal (MTP) joint of right foot 03/23/2016   Lumbar spinal stenosis 12/05/2015   Migraine 09/13/2012   Other allergic rhinitis 09/13/2012   Neuropathy 09/13/2012    ONSET DATE: November 2023   REFERRING DIAG: R13.10 (ICD-10-CM) - Dysphagia, unspecified   THERAPY DIAG:  Dysphagia, oropharyngeal phase  Rationale for Evaluation and Treatment: Rehabilitation  SUBJECTIVE:   SUBJECTIVE STATEMENT: "10-15 yes - 20? Maybe not." Pt accompanied by: self  PERTINENT HISTORY: Pt report of having difficulty swallowing foods since November 2023 with gustatory changes as well and xerstomia. Pt with PMH + for ? TIA *due to Gabapentin per ptA* resulting in transient left sided weakness that resolved completely, Raynaud's, skin cancer, Vit D deficiency, cervical radiculopathy, depression and fibromyalgia. Recurrent pneumonias. She reports coughing up just food at times. Pt denies issues with swallowing pills. PSH: reverse arthroplasty.   PAIN:  Are you having pain?  Yes: NPRS scale: 5/10 Pain location: neck/shoulders Pain description: ache, burning pain Aggravating factors: neck movement Relieving factors: being still  PATIENT GOALS: Improve swallow  OBJECTIVE:    RECOMMENDATIONS FROM OBJECTIVE SWALLOW STUDY (MBSS):  08/18/22 Objective swallow impairments: Patient presents with minimal pharyngeal impairments - c/b decreased laryngeal elevation and closure allowing  inconsistent laryngeal penetration of thin liquids, most notably with sequential boluses. No aspiration observed. Barium tablet taken with thin momentarily halted at pyriform sinus *tested in A-P view* with pt sensation as she consumed more liquids to clear. Pt sensed tablet stalling but also sensed it cleared. At one time, she pointed to the area between left sternum and left clavicle sensing discomfort. SLP recommends pt continue diet with brief SLP treatment primarily to focus on laryngeal elevation/closure for airway protection. Recommend pt start all intake with liquids and consume liquids t/o meal to compensate for her known xerostomia. Advised pt to take her medication with food if problematic to swallow with liquids. Using video fluoroscopy loops, educated pt to findings/recommendations.  Objective recommended compensations:  PO Diet Recommendation: Regular; Thin liquids (Level 0) Liquid Administration via: Cup; Straw Medication Administration: Whole meds with liquid Supervision: Patient able to self-feed Swallowing strategies  : Slow rate; Small bites/sips (Start intake with liquids) Postural changes: Position pt fully upright for meals; Stay upright 30-60 min after meals Oral care recommendations: Oral care BID (2x/day) .  PATIENT REPORTED OUTCOME MEASURES (PROM): EAT-10: completed 09/01/22 with 3/40 (lower scores = better QOL).    TODAY'S TREATMENT:                                                                                                                                         DATE:   09/07/22: HEP performed with rare min A with Mendelsohn (swallowing initially). Pt independent by session end. Pt stated she was completing HEP at suboptimal scope (<20 reps/each) and SLP told pt to perform at least 20/day.   09/01/22: EAT-10 completed today. Pt performed HEP with rare min A for supraglottic. Pt admittedly stated she was not performing that exercise and SLP provided rationale and  encouraged pt to complete. She agreed. Sharon Logan said she was having sips water prior to meals.   08/27/22: Pt needs EAT-10. "That's starting to be a forefront thing now - make sure I have that water first." Pt followed precautions with POs today with independence. HEP was completed with excellent success and initial rare min A faded to independent.   08/21/22 (eval): SLP educated pt on results of MBS and the recommendations from this exam and rationale for recommendations. SLP taught pt HEP for improving laryngeal elevation and closure.    PATIENT EDUCATION: Education details: see "today's treatment" Person educated: Patient Education method: Explanation, Demonstration, Verbal cues, and Handouts Education comprehension: verbalized understanding, returned demonstration, verbal cues required, and needs further education   ASSESSMENT:  CLINICAL IMPRESSION: Patient is a  67 y.o. F who was seen today for treatment of swallow skills following MBS on 08/18/22. Pt will work on [employer] and will work on strategies suggested at time of MBS. If progress is indpendent/modified indpendent with HEP and pt remains without overt s/sx oral or pharyngeal deficits she will decr to once every other week.  OBJECTIVE IMPAIRMENTS: include dysphagia. These impairments are limiting patient from safety when swallowing. Factors affecting potential to achieve goals and functional outcome are possibly ability to learn/carryover information, and possibly cooperation/participation level. Patient will benefit from skilled SLP services to address above impairments and improve overall function.  REHAB POTENTIAL: Good   GOALS: Goals reviewed with patient? Yes  SHORT TERM GOALS: Target date: 09/30/22  Pt will follow swallow preautions from MBS on 08/18/22 with modified independence in 2 sessions Baseline: Goal status: Met  2.  Pt will demo HEP with modified independence in 2 sessions Baseline:  Goal status: Ongoing  3.  Pt will  tell 3 overt s/sx PNA with mod I Baseline:  Goal status: Ongoing   LONG TERM GOALS: Target date: 11/20/22  Pt will follow swallow preautions from [place] on 08/18/22 with independence in 2 sessions Baseline: 08/27/22 Goal status: Met  2.  Pt will demo HEP with independence in 3 sessions Baseline:  Goal status: Ongoing  3.  Pt will indicate greater success with swallowing as measured by EAT-10 Baseline:  Goal status: Ongoing   PLAN:  SLP FREQUENCY: 1-2x/week  SLP DURATION: 12 weeks  PLANNED INTERVENTIONS: Aspiration precaution training, Pharyngeal strengthening exercises, Diet toleration management , Environmental controls, Trials of upgraded texture/liquids, SLP instruction and feedback, Compensatory strategies, and Patient/family education    [facility], [place] 09/07/2022, 2:07 PM

## 2022-09-14 ENCOUNTER — Ambulatory Visit: Payer: No Typology Code available for payment source | Attending: Family Medicine

## 2022-09-14 DIAGNOSIS — R1312 Dysphagia, oropharyngeal phase: Secondary | ICD-10-CM | POA: Insufficient documentation

## 2022-09-14 NOTE — Therapy (Signed)
OUTPATIENT SPEECH LANGUAGE PATHOLOGY SWALLOW THERAPY   Patient Name: Sharon Logan MRN: [id number] DOB:February 12, 1956, 67 y.o., female Today's Date: 09/14/2022  PCP: Faustino Congress, NP REFERRING PROVIDER: Wyline Beady, PA-C   END OF SESSION:  End of Session - 09/14/22 1413     Visit Number 5    Number of Visits 12    Date for SLP Re-Evaluation 11/19/22    SLP Start Time 1404    SLP Stop Time  1439    SLP Time Calculation (min) 35 min    Activity Tolerance Patient tolerated treatment well               Past Medical History:  Diagnosis Date   Allergic rhinitis    Anxiety    Arthritis    Cancer    skin   Chronic pain    Chronically dry eyes    Depression    Fibromyalgia    GERD (gastroesophageal reflux disease)    Headache    Osteoporosis    Sleep apnea    Stroke    from gabapenting per pt no deficits   Vitamin D deficiency    Wears glasses    Past Surgical History:  Procedure Laterality Date   ABDOMINAL HYSTERECTOMY     BACK SURGERY     BUNIONECTOMY Bilateral    EYE SURGERY     laser surgery   NASAL SINUS SURGERY     x2   REVERSE SHOULDER ARTHROPLASTY Right 07/03/2022   Procedure: REVERSE SHOULDER ARTHROPLASTY;  Surgeon: Netta Cedars, MD;  Location: [facility];  Service: Orthopedics;  Laterality: Right;  120 min choice with interscalene block   SACROILIAC JOINT FUSION Bilateral 09/12/2020   Procedure: REMOVAL OF BILATERAL PELVIC SCREWS;  Surgeon: Phylliss Bob, MD;  Location: [place];  Service: Orthopedics;  Laterality: Bilateral;   SKIN CANCER EXCISION     face x3   TUBAL LIGATION     Patient Active Problem List   Diagnosis Date Noted   S/P shoulder replacement, right 07/03/2022   Allergic contact dermatitis due to other agents 04/13/2022   S/P lumbar microdiscectomy 04/30/2021   S/P hardware removal 09/12/2020   Radiculopathy 06/29/2019   Cervical radiculopathy 08/15/2018   Chronic sinusitis 07/27/2018   Constipation 07/27/2018   Chronic  bilateral low back pain with bilateral sciatica 07/01/2018   Disorder of bone and cartilage 04/04/2018   Esophageal reflux 04/04/2018   Other and unspecified hyperlipidemia 04/04/2018   Other malaise and fatigue 04/04/2018   Pain in joint involving ankle and foot 04/04/2018   Pain in soft tissues of limb 04/04/2018   Posttraumatic stress disorder 04/04/2018   Raynaud's phenomenon 04/04/2018   Skin sensation disturbance 04/04/2018   Sleep disturbance 04/04/2018   Vitamin D deficiency 04/04/2018   Myoclonus, segmental 10/18/2017   Adjacent segment disease with spinal stenosis 10/04/2017   DDD (degenerative disc disease), lumbar 10/30/2016   Spondylolisthesis of lumbar region 08/21/2016   Overweight with body mass index (BMI) 25.0-29.9 07/22/2016   Capsulitis of metatarsophalangeal (MTP) joint of right foot 03/23/2016   Lumbar spinal stenosis 12/05/2015   Migraine 09/13/2012   Other allergic rhinitis 09/13/2012   Neuropathy 09/13/2012    ONSET DATE: November 2023   REFERRING DIAG: R13.10 (ICD-10-CM) - Dysphagia, unspecified   THERAPY DIAG:  Dysphagia, oropharyngeal phase  Rationale for Evaluation and Treatment: Rehabilitation  SUBJECTIVE:   SUBJECTIVE STATEMENT: "I really tried to take a sip before my bites - and I didn't cough!" Pt accompanied by: self  PERTINENT HISTORY: Pt report of having difficulty swallowing foods since November 2023 with gustatory changes as well and xerstomia. Pt with PMH + for ? TIA *due to Gabapentin per ptA* resulting in transient left sided weakness that resolved completely, Raynaud's, skin cancer, Vit D deficiency, cervical radiculopathy, depression and fibromyalgia. Recurrent pneumonias. She reports coughing up just food at times. Pt denies issues with swallowing pills. PSH: reverse arthroplasty.   PAIN:  Are you having pain? Yes: NPRS scale: 6/10 Pain location: neck/shoulders Pain description: ache, burning pain Aggravating factors: neck  movement Relieving factors: being still  PATIENT GOALS: Improve swallow  OBJECTIVE:    RECOMMENDATIONS FROM OBJECTIVE SWALLOW STUDY (MBSS):  08/18/22 Objective swallow impairments: Patient presents with minimal pharyngeal impairments - c/b decreased laryngeal elevation and closure allowing inconsistent laryngeal penetration of thin liquids, most notably with sequential boluses. No aspiration observed. Barium tablet taken with thin momentarily halted at pyriform sinus *tested in A-P view* with pt sensation as she consumed more liquids to clear. Pt sensed tablet stalling but also sensed it cleared. At one time, she pointed to the area between left sternum and left clavicle sensing discomfort. SLP recommends pt continue diet with brief SLP treatment primarily to focus on laryngeal elevation/closure for airway protection. Recommend pt start all intake with liquids and consume liquids t/o meal to compensate for her known xerostomia. Advised pt to take her medication with food if problematic to swallow with liquids. Using video fluoroscopy loops, educated pt to findings/recommendations.  Objective recommended compensations:  PO Diet Recommendation: Regular; Thin liquids (Level 0) Liquid Administration via: Cup; Straw Medication Administration: Whole meds with liquid Supervision: Patient able to self-feed Swallowing strategies  : Slow rate; Small bites/sips (Start intake with liquids) Postural changes: Position pt fully upright for meals; Stay upright 30-60 min after meals Oral care recommendations: Oral care BID (2x/day) .  PATIENT REPORTED OUTCOME MEASURES (PROM): EAT-10: completed 09/01/22 with 3/40 (lower scores = better QOL).    TODAY'S TREATMENT:                                                                                                                                         DATE:   09/14/22: Pt needs EAT-10. Pt tells SLP she has to focus when she does Mendelsohn "I can't just go into it"  she shared today. She completed all exercises correctly, and followed precautions indpendently. Had 8 bites and took 5 sips without any oral deficits noted nor any overt s/sx pharyngeal deficits. Pt to be seen again in 2 weeks. SLP offered to pt to make one more appointment but pt stated "How about if I come in next time and you can make the decision whether or not I need one more."  09/07/22: HEP performed with rare min A with Mendelsohn (swallowing initially). Pt independent by session end. Pt stated she was completing HEP at suboptimal scope (<20 reps/each) and  SLP told pt to perform at least 20/day.   09/01/22: EAT-10 completed today. Pt performed HEP with rare min A for supraglottic. Pt admittedly stated she was not performing that exercise and SLP provided rationale and encouraged pt to complete. She agreed. [place] said she was having sips water prior to meals.   08/27/22: Pt needs EAT-10. "That's starting to be a forefront thing now - make sure I have that water first." Pt followed precautions with POs today with independence. HEP was completed with excellent success and initial rare min A faded to independent.   08/21/22 (eval): SLP educated pt on results of MBS and the recommendations from this exam and rationale for recommendations. SLP taught pt HEP for improving laryngeal elevation and closure.    PATIENT EDUCATION: Education details: see "today's treatment" Person educated: Patient Education method: Explanation, Demonstration, Verbal cues, and Handouts Education comprehension: verbalized understanding, returned demonstration, verbal cues required, and needs further education   ASSESSMENT:  CLINICAL IMPRESSION: Patient is a 67 y.o. F who was seen today for treatment of swallow skills following MBS on 08/18/22. Pt will work on [employer] and will work on strategies suggested at time of MBS. If progress is indpendent/modified indpendent with HEP and pt remains without overt s/sx oral or pharyngeal  deficits she will decr to once every other week.  OBJECTIVE IMPAIRMENTS: include dysphagia. These impairments are limiting patient from safety when swallowing. Factors affecting potential to achieve goals and functional outcome are possibly ability to learn/carryover information, and possibly cooperation/participation level. Patient will benefit from skilled SLP services to address above impairments and improve overall function.  REHAB POTENTIAL: Good   GOALS: Goals reviewed with patient? Yes  SHORT TERM GOALS: Target date: 09/30/22  Pt will follow swallow preautions from MBS on 08/18/22 with modified independence in 2 sessions Baseline: Goal status: Met  2.  Pt will demo HEP with modified independence in 2 sessions Baseline:  Goal status: Ongoing  3.  Pt will tell 3 overt s/sx PNA with mod I Baseline:  Goal status: Ongoing   LONG TERM GOALS: Target date: 11/20/22  Pt will follow swallow preautions from [place] on 08/18/22 with independence in 2 sessions Baseline: 08/27/22 Goal status: Met  2.  Pt will demo HEP with independence in 3 sessions Baseline: 09/14/22 Goal status: Ongoing  3.  Pt will indicate greater success with swallowing as measured by EAT-10 Baseline:  Goal status: Ongoing   PLAN:  SLP FREQUENCY: every other week  SLP DURATION: 12 weeks  PLANNED INTERVENTIONS: Aspiration precaution training, Pharyngeal strengthening exercises, Diet toleration management , Environmental controls, Trials of upgraded texture/liquids, SLP instruction and feedback, Compensatory strategies, and Patient/family education    [facility], [place] 09/14/2022, 2:49 PM

## 2022-09-21 ENCOUNTER — Ambulatory Visit: Payer: No Typology Code available for payment source

## 2022-09-23 ENCOUNTER — Other Ambulatory Visit: Payer: Self-pay | Admitting: Psychiatry

## 2022-09-23 DIAGNOSIS — F4310 Post-traumatic stress disorder, unspecified: Secondary | ICD-10-CM

## 2022-09-23 DIAGNOSIS — F339 Major depressive disorder, recurrent, unspecified: Secondary | ICD-10-CM

## 2022-09-27 NOTE — Telephone Encounter (Signed)
Has appt 4/17

## 2022-09-28 ENCOUNTER — Encounter: Payer: Medicare HMO | Attending: Physical Medicine and Rehabilitation | Admitting: Physical Medicine and Rehabilitation

## 2022-09-28 ENCOUNTER — Ambulatory Visit: Payer: No Typology Code available for payment source

## 2022-09-28 VITALS — BP 147/81 | HR 60 | Ht 63.0 in | Wt 134.0 lb

## 2022-09-28 DIAGNOSIS — R1312 Dysphagia, oropharyngeal phase: Secondary | ICD-10-CM | POA: Diagnosis not present

## 2022-09-28 DIAGNOSIS — M7918 Myalgia, other site: Secondary | ICD-10-CM | POA: Insufficient documentation

## 2022-09-28 MED ORDER — SODIUM CHLORIDE (PF) 0.9 % IJ SOLN
2.0000 mL | Freq: Once | INTRAMUSCULAR | Status: AC
  Administered 2022-09-28: 2 mL via INTRAVENOUS

## 2022-09-28 MED ORDER — LIDOCAINE HCL 1 % IJ SOLN
3.0000 mL | Freq: Once | INTRAMUSCULAR | Status: AC
  Administered 2022-09-28: 3 mL

## 2022-09-28 NOTE — Therapy (Signed)
OUTPATIENT SPEECH LANGUAGE PATHOLOGY SWALLOW THERAPY/Discharge Summary   Patient Name: Sharon Logan MRN: [id number] DOB:1955-08-30, 67 y.o., female Today's Date: 09/29/2022  PCP: Moshe Cipro, NP REFERRING PROVIDER: Kathrin Penner, PA-C   END OF SESSION:  End of Session - 09/28/22 1416     Visit Number 6    Number of Visits 12    Date for SLP Re-Evaluation 11/19/22    SLP Start Time 1407    SLP Stop Time  1435    SLP Time Calculation (min) 28 min    Activity Tolerance Patient tolerated treatment well               Past Medical History:  Diagnosis Date   Allergic rhinitis    Anxiety    Arthritis    Cancer    skin   Chronic pain    Chronically dry eyes    Depression    Fibromyalgia    GERD (gastroesophageal reflux disease)    Headache    Osteoporosis    Sleep apnea    Stroke    from gabapenting per pt no deficits   Vitamin D deficiency    Wears glasses    Past Surgical History:  Procedure Laterality Date   ABDOMINAL HYSTERECTOMY     BACK SURGERY     BUNIONECTOMY Bilateral    EYE SURGERY     laser surgery   NASAL SINUS SURGERY     x2   REVERSE SHOULDER ARTHROPLASTY Right 07/03/2022   Procedure: REVERSE SHOULDER ARTHROPLASTY;  Surgeon: Beverely Low, MD;  Location: [facility];  Service: Orthopedics;  Laterality: Right;  120 min choice with interscalene block   SACROILIAC JOINT FUSION Bilateral 09/12/2020   Procedure: REMOVAL OF BILATERAL PELVIC SCREWS;  Surgeon: Estill Bamberg, MD;  Location: [facility];  Service: Orthopedics;  Laterality: Bilateral;   SKIN CANCER EXCISION     face x3   TUBAL LIGATION     Patient Active Problem List   Diagnosis Date Noted   S/P shoulder replacement, right 07/03/2022   Allergic contact dermatitis due to other agents 04/13/2022   S/P lumbar microdiscectomy 04/30/2021   S/P hardware removal 09/12/2020   Radiculopathy 06/29/2019   Cervical radiculopathy 08/15/2018   Chronic sinusitis 07/27/2018   Constipation  07/27/2018   Chronic bilateral low back pain with bilateral sciatica 07/01/2018   Disorder of bone and cartilage 04/04/2018   Esophageal reflux 04/04/2018   Other and unspecified hyperlipidemia 04/04/2018   Other malaise and fatigue 04/04/2018   Pain in joint involving ankle and foot 04/04/2018   Pain in soft tissues of limb 04/04/2018   Posttraumatic stress disorder 04/04/2018   Raynaud's phenomenon 04/04/2018   Skin sensation disturbance 04/04/2018   Sleep disturbance 04/04/2018   Vitamin D deficiency 04/04/2018   Myoclonus, segmental 10/18/2017   Adjacent segment disease with spinal stenosis 10/04/2017   DDD (degenerative disc disease), lumbar 10/30/2016   Spondylolisthesis of lumbar region 08/21/2016   Overweight with body mass index (BMI) 25.0-29.9 07/22/2016   Capsulitis of metatarsophalangeal (MTP) joint of right foot 03/23/2016   Lumbar spinal stenosis 12/05/2015   Migraine 09/13/2012   Other allergic rhinitis 09/13/2012   Neuropathy 09/13/2012   SPEECH THERAPY DISCHARGE SUMMARY  Visits from Start of Care: 6  Current functional level related to goals / functional outcomes: See below. Pt made progress with performing HEP for swallowing and will cont this until 10/14/22, at which time she was told to cont x2/week. She reports she is safer at home  with POs and has not had coughing episode with POs in session    Remaining deficits: None noted today, with precautions   Education / Equipment: See "today's treatment" and "pt education" sections.   Patient agrees to discharge. Patient goals were partially met. Patient is being discharged due to being pleased with the current functional level..    ONSET DATE: November 2023   REFERRING DIAG: R13.10 (ICD-10-CM) - Dysphagia, unspecified   THERAPY DIAG:  Dysphagia, oropharyngeal phase  Rationale for Evaluation and Treatment: Rehabilitation  SUBJECTIVE:   SUBJECTIVE STATEMENT: "I only had one time that I had some  coughing (in the last two weeks)." Pt accompanied by: self  PERTINENT HISTORY: Pt report of having difficulty swallowing foods since November 2023 with gustatory changes as well and xerstomia. Pt with PMH + for ? TIA *due to Gabapentin per ptA* resulting in transient left sided weakness that resolved completely, Raynaud's, skin cancer, Vit D deficiency, cervical radiculopathy, depression and fibromyalgia. Recurrent pneumonias. She reports coughing up just food at times. Pt denies issues with swallowing pills. PSH: reverse arthroplasty.   PAIN:  Are you having pain? Yes: NPRS scale: 5/10 Pain location: rt shoulder Pain description: ache, burning pain Aggravating factors: neck movement Relieving factors: being still  PATIENT GOALS: Improve swallow  OBJECTIVE:    RECOMMENDATIONS FROM OBJECTIVE SWALLOW STUDY (MBSS):  08/18/22 Objective swallow impairments: Patient presents with minimal pharyngeal impairments - c/b decreased laryngeal elevation and closure allowing inconsistent laryngeal penetration of thin liquids, most notably with sequential boluses. No aspiration observed. Barium tablet taken with thin momentarily halted at pyriform sinus *tested in A-P view* with pt sensation as she consumed more liquids to clear. Pt sensed tablet stalling but also sensed it cleared. At one time, she pointed to the area between left sternum and left clavicle sensing discomfort. SLP recommends pt continue diet with brief SLP treatment primarily to focus on laryngeal elevation/closure for airway protection. Recommend pt start all intake with liquids and consume liquids t/o meal to compensate for her known xerostomia. Advised pt to take her medication with food if problematic to swallow with liquids. Using video fluoroscopy loops, educated pt to findings/recommendations.  Objective recommended compensations:  PO Diet Recommendation: Regular; Thin liquids (Level 0) Liquid Administration via: Cup; Straw Medication  Administration: Whole meds with liquid Supervision: Patient able to self-feed Swallowing strategies  : Slow rate; Small bites/sips (Start intake with liquids) Postural changes: Position pt fully upright for meals; Stay upright 30-60 min after meals Oral care recommendations: Oral care BID (2x/day) .  PATIENT REPORTED OUTCOME MEASURES (PROM): EAT-10: completed 09/01/22 with 3/40 (lower scores = better QOL).    TODAY'S TREATMENT:                                                                                                                                         DATE:   09/28/22: Pt completed EAT-10 with  perfect score (zero). She was independent with HEP, and ate fig bar and drank water without any overt signs of oral or pharyngeal deficits. She is comfortable with HEP and with precautions and agrees with discharge today.  09/14/22: Pt needs EAT-10. Pt tells SLP she has to focus when she does Mendelsohn "I can't just go into it" she shared today. She completed all exercises correctly, and followed precautions indpendently. Had 8 bites and took 5 sips without any oral deficits noted nor any overt s/sx pharyngeal deficits. Pt to be seen again in 2 weeks. SLP offered to pt to make one more appointment but pt stated "How about if I come in next time and you can make the decision whether or not I need one more."  09/07/22: HEP performed with rare min A with Mendelsohn (swallowing initially). Pt independent by session end. Pt stated she was completing HEP at suboptimal scope (<20 reps/each) and SLP told pt to perform at least 20/day.   09/01/22: EAT-10 completed today. Pt performed HEP with rare min A for supraglottic. Pt admittedly stated she was not performing that exercise and SLP provided rationale and encouraged pt to complete. She agreed. Deyjah said she was having sips water prior to meals.   08/27/22: Pt needs EAT-10. "That's starting to be a forefront thing now - make sure I have that water first." Pt  followed precautions with POs today with independence. HEP was completed with excellent success and initial rare min A faded to independent.   08/21/22 (eval): SLP educated pt on results of MBS and the recommendations from this exam and rationale for recommendations. SLP taught pt HEP for improving laryngeal elevation and closure.    PATIENT EDUCATION: Education details: see "today's treatment" Person educated: Patient Education method: Explanation, Demonstration, Verbal cues, and Handouts Education comprehension: verbalized understanding, returned demonstration, verbal cues required, and needs further education   ASSESSMENT:  CLINICAL IMPRESSION: Patient is a 67 y.o. F who was seen today for treatment of swallow skills following MBS on 08/18/22. Pt will work on [employer] and will work on strategies suggested at time of MBS. If progress is indpendent/modified indpendent with HEP and pt remains without overt s/sx oral or pharyngeal deficits she will decr to once every other week.  OBJECTIVE IMPAIRMENTS: include dysphagia. These impairments are limiting patient from safety when swallowing. Factors affecting potential to achieve goals and functional outcome are possibly ability to learn/carryover information, and possibly cooperation/participation level. Patient will benefit from skilled SLP services to address above impairments and improve overall function.  REHAB POTENTIAL: Good   GOALS: Goals reviewed with patient? Yes  SHORT TERM GOALS: Target date: 09/30/22  Pt will follow swallow preautions from MBS on 08/18/22 with modified independence in 2 sessions Baseline: Goal status: Met  2.  Pt will demo HEP with modified independence in 2 sessions Baseline:  Goal status: deferred to LTG  3.  Pt will tell 3 overt s/sx PNA with mod I Baseline:  Goal status: not met   LONG TERM GOALS: Target date: 11/20/22  Pt will follow swallow preautions from MBS on 08/18/22 with independence in 2  sessions Baseline: 08/27/22 Goal status: Met  2.  Pt will demo HEP with independence in 3 sessions Baseline: 09/14/22, 09/28/22 Goal status: Partially met  3.  Pt will indicate greater success with swallowing as measured by EAT-10 Baseline:  Goal status: Met   PLAN:  SLP FREQUENCY: every other week  SLP DURATION: 12 weeks  PLANNED INTERVENTIONS: Aspiration precaution training,  Pharyngeal strengthening exercises, Diet toleration management , Environmental controls, Trials of upgraded texture/liquids, SLP instruction and feedback, Compensatory strategies, and Patient/family education    [facility], CCC-SLP 09/29/2022, 8:23 AM

## 2022-09-28 NOTE — Progress Notes (Addendum)
Trigger Point Injection  Indication: Cervical myofascial pain not relieved by medication management and other conservative care.  Informed consent was obtained after describing risk and benefits of the procedure with the patient, this includes bleeding, bruising, infection and medication side effects.  The patient wishes to proceed and has given written consent.  The patient was placed in a seated position.  The area of pain was marked and prepped with Betadine.  It was entered with a 25-gauge 1/2 inch needle and a total of 5 mL of 1% lidocaine and normal saline was injected into a total of 4 trigger points, after negative draw back for blood.  The patient tolerated the procedure well.  Post procedure instructions were given.   Prescribed Zynex Nexwave, cervical traction device

## 2022-09-28 NOTE — Addendum Note (Signed)
Addended by: Janean Sark on: 09/28/2022 10:39 AM   Modules accepted: Orders

## 2022-09-30 ENCOUNTER — Encounter: Payer: Self-pay | Admitting: Psychiatry

## 2022-09-30 ENCOUNTER — Ambulatory Visit (INDEPENDENT_AMBULATORY_CARE_PROVIDER_SITE_OTHER): Payer: Medicare HMO | Admitting: Psychiatry

## 2022-09-30 DIAGNOSIS — F4310 Post-traumatic stress disorder, unspecified: Secondary | ICD-10-CM | POA: Diagnosis not present

## 2022-09-30 DIAGNOSIS — F339 Major depressive disorder, recurrent, unspecified: Secondary | ICD-10-CM | POA: Diagnosis not present

## 2022-09-30 MED ORDER — DULOXETINE HCL 30 MG PO CPEP: 30.0000 mg | ORAL_CAPSULE | Freq: Every day | ORAL | 1 refills | Status: DC

## 2022-09-30 MED ORDER — MIRTAZAPINE 15 MG PO TABS: ORAL_TABLET | ORAL | 0 refills | Status: DC

## 2022-09-30 MED ORDER — BUPROPION HCL ER (XL) 150 MG PO TB24: 150.0000 mg | ORAL_TABLET | Freq: Every day | ORAL | 1 refills | Status: DC

## 2022-09-30 NOTE — Progress Notes (Signed)
ROSHAWN Logan [id number] 1956-05-15 67 y.o.  Subjective:   Patient ID:  Sharon Logan is a 67 y.o. (DOB Nov 09, 1955) female.  Chief Complaint:  Chief Complaint  Patient presents with   Other    Low energy and motivation    HPI [name] presents to the office today for follow-up of depression, anxiety, and insomnia.  She reports, "The depression has lifted. I still don't have energy... it feels like I am coming alive again but I still have a ways to go." She reports low energy and motivation. "I'm always angry at me." She reports Cymbalta has been somewhat helpful for pain. "The heaviness is not there." She reports that sadness is less and no longer as persistent. She reports, "I'm trying things, but I can't say I'm enjoying things." She reports very slight increase in interest. She reports that her appetite varies some.   She reports anxiety is "good right now... as long as the depression is improving, I'm not feeling the anxiety."  She notices some stomach pain with Remeron. She reports that it helps with sleep initiation. "Still fighting with the cPap machine." She experiences some claustrophobia with cPap. Denies SI.   Continues to see Karmen Bongo, [facility] for therapy.   She reports that she is taking Cymbalta around 10 am-12 noon.   Past Medication Trials: Cymbalta- Started after back surgery. Has made her tired, even when reduced to 30 mg. Was having to take 2 hour naps. May have helped slightly with pain. Had HA, Constipation, Nausea Savella- muscle aches, rhabdomyolysis Effexor-HA, helped with excessive sweating.  Pristiq- May have increased depression. Prozac- Constipation. Causes her to feel cold.  Celexa- Worsening depression at 20 mg dose Lexapro- "My anti-depressant of choice" and gives her energy. Was taking 2-3 years prior to surgery. Re-started one week ago. Has taken 10 mg and 20 mg doses in the past. Thinks it may have helped with pain in the past. Zoloft-  worsening mood Paxil Viibryd- Felt depression was more "heavy." Wellbutrin- Did not cause HA's. Tolerated well and was effective for depression. Amitriptyline- Did not cause HA's. Weight gain. Effective.  Nortriptyline- Did not cause HA's. Effective. Lamictal- Rash Gabapentin -Rhabomyolysis Lyrica- Adverse reaction Buspar- Helpful for anxiety in the past. Well-tolerated. Trazodone- helped with sleep initiation but sleep was not restful. Severe dry mouth. Ambien- Parasomnia Lunesta- Tolerated, effective at 2 mg dose Remeron- Dry eyes, internal restlessness. Stomach discomfort.  Clonidine Topamax- ineffective for headaches. Caused cognitive side effects.  Hydroxyzine- Initially helped with anxiety and sleep initiation, then no longer as helpful    PHQ2-9    Flowsheet Row Office Visit from 09/28/2022 in [facility] Physical Medicine & Rehabilitation Office Visit from 07/27/2022 in [facility] Physical Medicine & Rehabilitation Office Visit from 05/25/2022 in [facility] Physical Medicine & Rehabilitation Office Visit from 10/06/2021 in [facility] Physical Medicine & Rehabilitation Office Visit from 03/25/2021 in [facility] Physical Medicine & Rehabilitation  PHQ-2 Total Score 0 2 0 0 0      Flowsheet Row Admission (Discharged) from 07/03/2022 in [facility] Pre-Admission Testing 60 from 06/18/2022 in [facility] from 10/12/2021 in [facility] Emergency Department at [facility]  C-SSRS RISK CATEGORY No Risk No Risk No Risk        Review of Systems:  Review of Systems  Musculoskeletal:  Positive for arthralgias, back pain and neck pain. Negative for gait problem.  Neurological:  Positive for headaches. Negative for  tremors.  Psychiatric/Behavioral:         Please refer to HPI    Medications: I have reviewed the patient's current medications.  Current Outpatient Medications  Medication Sig Dispense Refill    acetaminophen (TYLENOL) 325 MG tablet Take 650 mg by mouth every 6 (six) hours as needed for moderate pain.     amLODipine (NORVASC) 5 MG tablet Take 5 mg by mouth daily.     baclofen (LIORESAL) 10 MG tablet Take 10 mg by mouth 3 (three) times daily. Reports taking 2-3 times daily     Carboxymethylcellulose Sodium (THERATEARS OP) Place 1 drop into both eyes 4 (four) times daily as needed (dry eyes).     cetirizine (ZYRTEC) 10 MG tablet Take 10 mg by mouth daily as needed for allergies.     diclofenac Sodium (VOLTAREN) 1 % GEL Apply 2 g topically daily as needed (pain).     fluticasone (FLONASE) 50 MCG/ACT nasal spray Place 2 sprays into both nostrils 2 (two) times daily as needed for allergies or rhinitis.     lidocaine (LIDODERM) 5 % Place 1 patch onto the skin daily.     magnesium 30 MG tablet Take 30 mg by mouth at bedtime.     rizatriptan (MAXALT) 10 MG tablet Take 10 mg by mouth daily as needed for migraine.      saccharomyces boulardii (FLORASTOR) 250 MG capsule Take 250 mg by mouth daily.     sulindac (CLINORIL) 150 MG tablet Take 150 mg by mouth 2 (two) times daily.     XTAMPZA ER 13.5 MG C12A Take 13.5 mg by mouth every 12 (twelve) hours.     buPROPion (WELLBUTRIN XL) 150 MG 24 hr tablet Take 1 tablet (150 mg total) by mouth daily. 30 tablet 1   busPIRone (BUSPAR) 15 MG tablet Take 1/3 tablet p.o. twice daily for 1 week, then take 2/3 tablet p.o. twice daily for 1 week, then take 1 tablet p.o. twice daily (Patient not taking: Reported on 09/30/2022) 60 tablet 1   DULoxetine (CYMBALTA) 30 MG capsule Take 1 capsule (30 mg total) by mouth daily. 30 capsule 1   EPINEPHrine 0.3 mg/0.3 mL IJ SOAJ injection Inject 0.3 mg into the muscle as needed for anaphylaxis.     mirtazapine (REMERON) 15 MG tablet Take 1/2-1 tab po QHS 30 tablet 0   No current facility-administered medications for this visit.    Medication Side Effects: Other: GI side effects  Allergies:  Allergies  Allergen Reactions    Gabapentin Other (See Comments)    Delirium, stroke like symptoms  Other Reaction(s): Myalgia, Other  Has stroke symptoms when taking this with percocet.     Other reaction(s): Delirium    Delirium, stroke like symptoms   Amitriptyline Other (See Comments)   Amlodipine Other (See Comments)    headaches   Polymyxin B Other (See Comments)    Eyes go blood red    Pregabalin Itching and Rash    Itchy red rash on chest  Other Reaction(s): other   Zolpidem Rash    "sleep walk"    Cymbalta [Duloxetine Hcl] Other (See Comments)    Headaches, constipation   Fluoxetine Other (See Comments)    CONSTIPATION    Lamotrigine Rash   Other Other (See Comments)    OTOBIOTIC > RED EYES    Past Medical History:  Diagnosis Date   Allergic rhinitis    Anxiety    Arthritis    Cancer    skin  Chronic pain    Chronically dry eyes    Depression    Fibromyalgia    GERD (gastroesophageal reflux disease)    Headache    Osteoporosis    Sleep apnea    Stroke    from gabapenting per pt no deficits   Vitamin D deficiency    Wears glasses     Past Medical History, Surgical history, Social history, and Family history were reviewed and updated as appropriate.   Please see review of systems for further details on the patient's review from today.   Objective:   Physical Exam:  There were no vitals taken for this visit.  Physical Exam Constitutional:      General: She is not in acute distress. Musculoskeletal:        General: No deformity.  Neurological:     Mental Status: She is alert and oriented to person, place, and time.     Coordination: Coordination normal.  Psychiatric:        Attention and Perception: Attention and perception normal. She does not perceive auditory or visual hallucinations.        Mood and Affect: Affect is not labile, blunt, angry or inappropriate.        Speech: Speech normal.        Behavior: Behavior normal.        Thought Content: Thought content  normal. Thought content is not paranoid or delusional. Thought content does not include homicidal or suicidal ideation. Thought content does not include homicidal or suicidal plan.        Cognition and Memory: Cognition and memory normal.        Judgment: Judgment normal.     Comments: Insight intact Mood presents as less anxious and less depressed.     Lab Review:     Component Value Date/Time   NA 140 07/04/2022 0407   NA 142 06/03/2021 1623   K 3.9 07/04/2022 0407   CL 109 07/04/2022 0407   CO2 26 07/04/2022 0407   GLUCOSE 95 07/04/2022 0407   BUN 15 07/04/2022 0407   BUN 21 06/03/2021 1623   CREATININE 0.73 07/04/2022 0407   CALCIUM 8.1 (L) 07/04/2022 0407   PROT 7.4 04/21/2021 0910   ALBUMIN 3.8 04/21/2021 0910   AST 26 04/21/2021 0910   ALT 21 04/21/2021 0910   ALKPHOS 120 04/21/2021 0910   BILITOT 0.7 04/21/2021 0910   GFRNONAA >60 07/04/2022 0407   GFRAA >60 06/26/2019 1008       Component Value Date/Time   WBC 7.0 06/18/2022 1036   RBC 4.71 06/18/2022 1036   HGB 10.8 (L) 07/04/2022 0407   HGB 12.1 06/03/2021 1622   HCT 32.8 (L) 07/04/2022 0407   HCT 37.6 06/03/2021 1622   PLT 294 06/18/2022 1036   PLT 336 06/03/2021 1622   MCV 94.5 06/18/2022 1036   MCV 92 06/03/2021 1622   MCH 29.9 06/18/2022 1036   MCHC 31.7 06/18/2022 1036   RDW 12.8 06/18/2022 1036   RDW 12.8 06/03/2021 1622   LYMPHSABS 2.0 06/03/2021 1622   MONOABS 0.5 04/21/2021 0910   EOSABS 0.1 06/03/2021 1622   BASOSABS 0.0 06/03/2021 1622    No results found for: "POCLITH", "LITHIUM"   No results found for: "PHENYTOIN", "PHENOBARB", "VALPROATE", "CBMZ"   .res Assessment: Plan:    Patient seen for 30 minutes and time spent discussing treatment options since she reports an improvement in her mood and anxiety, with continued low energy and motivation.  Discussed treatment  option of increasing Cymbalta or restarting Wellbutrin XL since she reports that has been helpful for similar symptoms in  the past.  Patient reports that she would prefer to restart Wellbutrin XL.  Reviewed potential benefits, risks, and side effects of Wellbutrin XL. Discussed attempting to reduce and discontinue mirtazapine due to stomach pain. Will continue Cymbalta 30 mg daily for depression and anxiety. Recommend continuing psychotherapy.  Pt to follow-up with this provider in 4 weeks or sooner if clinically indicated.  Patient advised to contact office with any questions, adverse effects, or acute worsening in signs and symptoms.   Angala was seen today for other.  Diagnoses and all orders for this visit:  PTSD (post-traumatic stress disorder) -     mirtazapine (REMERON) 15 MG tablet; Take 1/2-1 tab po QHS -     DULoxetine (CYMBALTA) 30 MG capsule; Take 1 capsule (30 mg total) by mouth daily.  Major depression, recurrent, chronic -     buPROPion (WELLBUTRIN XL) 150 MG 24 hr tablet; Take 1 tablet (150 mg total) by mouth daily. -     mirtazapine (REMERON) 15 MG tablet; Take 1/2-1 tab po QHS -     DULoxetine (CYMBALTA) 30 MG capsule; Take 1 capsule (30 mg total) by mouth daily.     Please see After Visit Summary for patient specific instructions.  Future Appointments  Date Time Provider Department Center  10/06/2022  2:45 PM Nona Dell [facility]  10/07/2022  9:30 AM Drema Dallas, DO [facility] None  10/26/2022 11:15 AM Derrell Lolling Rolin Barry, RD [facility]  10/26/2022  1:00 PM Corie Chiquito, PMHNP [facility] None  11/27/2022 11:40 AM Carlis Abbott, Drema Pry, MD [facility] CPR  01/25/2023 10:20 AM Raulkar, Drema Pry, MD [facility] CPR    No orders of the defined types were placed in this encounter.   -------------------------------

## 2022-10-01 DIAGNOSIS — M159 Polyosteoarthritis, unspecified: Secondary | ICD-10-CM | POA: Diagnosis not present

## 2022-10-01 DIAGNOSIS — G8929 Other chronic pain: Secondary | ICD-10-CM | POA: Diagnosis not present

## 2022-10-01 DIAGNOSIS — M79671 Pain in right foot: Secondary | ICD-10-CM | POA: Diagnosis not present

## 2022-10-01 DIAGNOSIS — Z9889 Other specified postprocedural states: Secondary | ICD-10-CM | POA: Diagnosis not present

## 2022-10-01 DIAGNOSIS — Z79899 Other long term (current) drug therapy: Secondary | ICD-10-CM | POA: Diagnosis not present

## 2022-10-01 DIAGNOSIS — J3089 Other allergic rhinitis: Secondary | ICD-10-CM | POA: Diagnosis not present

## 2022-10-01 DIAGNOSIS — M79672 Pain in left foot: Secondary | ICD-10-CM | POA: Diagnosis not present

## 2022-10-01 DIAGNOSIS — M25512 Pain in left shoulder: Secondary | ICD-10-CM | POA: Diagnosis not present

## 2022-10-01 DIAGNOSIS — K219 Gastro-esophageal reflux disease without esophagitis: Secondary | ICD-10-CM | POA: Diagnosis not present

## 2022-10-01 DIAGNOSIS — M180 Bilateral primary osteoarthritis of first carpometacarpal joints: Secondary | ICD-10-CM | POA: Diagnosis not present

## 2022-10-01 DIAGNOSIS — M79641 Pain in right hand: Secondary | ICD-10-CM | POA: Diagnosis not present

## 2022-10-01 DIAGNOSIS — M79642 Pain in left hand: Secondary | ICD-10-CM | POA: Diagnosis not present

## 2022-10-01 DIAGNOSIS — J309 Allergic rhinitis, unspecified: Secondary | ICD-10-CM | POA: Diagnosis not present

## 2022-10-05 DIAGNOSIS — G43009 Migraine without aura, not intractable, without status migrainosus: Secondary | ICD-10-CM | POA: Diagnosis not present

## 2022-10-05 DIAGNOSIS — B349 Viral infection, unspecified: Secondary | ICD-10-CM | POA: Diagnosis not present

## 2022-10-05 DIAGNOSIS — M810 Age-related osteoporosis without current pathological fracture: Secondary | ICD-10-CM | POA: Diagnosis not present

## 2022-10-05 DIAGNOSIS — G8929 Other chronic pain: Secondary | ICD-10-CM | POA: Diagnosis not present

## 2022-10-05 DIAGNOSIS — M797 Fibromyalgia: Secondary | ICD-10-CM | POA: Diagnosis not present

## 2022-10-05 DIAGNOSIS — M542 Cervicalgia: Secondary | ICD-10-CM | POA: Diagnosis not present

## 2022-10-05 DIAGNOSIS — Z6824 Body mass index (BMI) 24.0-24.9, adult: Secondary | ICD-10-CM | POA: Diagnosis not present

## 2022-10-05 DIAGNOSIS — J302 Other seasonal allergic rhinitis: Secondary | ICD-10-CM | POA: Diagnosis not present

## 2022-10-05 DIAGNOSIS — G894 Chronic pain syndrome: Secondary | ICD-10-CM | POA: Diagnosis not present

## 2022-10-05 NOTE — Progress Notes (Unsigned)
NEUROLOGY FOLLOW UP OFFICE NOTE  Sharon Logan [id number]  Assessment/Plan:   Migraine without aura, without status migrainosus.      1.  Defer preventative. *** 2.  Maxalt as needed.  Due to age, I would rather she not remain on a triptan.  *** 3.  Limit use of pain relievers to no more than 2 days out of week to prevent risk of rebound or medication-overuse headache. 4.  Keep headache diary 5.  Follow up one year ***   Subjective:  Sharon Logan is a 67 year old right-handed female with fibromyalgia and depression who follows up for migraines and dizziness.   UPDATE: Nurtec *** With Maxalt, headache resolves within 4 hours (within 2-2.5 hours if she can lay down and rest).  They occur 4 times a month.   Current NSAIDS/analgesics:  none Current triptans:  Maxalt 10mg  Current Antidepressant medications: Lexapro Current CGRP inhibitor:  none Other therapy:  none   Caffeine:  2 cups black tea daily.  1 Mt Dew once a week.  No coffee. Diet:  100 oz water daily.  Does not skip meals Exercise:  yes Depression:  Generally controlled; but a little worse now. Other pain:  Fibromyalgia, back pain Sleep hygiene:  Gets a good 5 hours a night.     HISTORY: She has had migraines "all my life" but worse since menopause.  Weather always is a trigger.  Headaches have been worse.  Typical headaches are across the forehead.  They have changed over the past 3 to 4 months.  They last 2-3 hours with Maxalt and occur about 15 days a month.  They are now bilateral retro-orbital aching pain.  There is associated with nausea, photophobia, phonophobia and in the past left greater than right arm numbness.  She now has associated dizziness, described as a spinning sensation in which she needs to hold onto something, lasting a couple of minutes.  It was originally 2 times a day and then 1 to 2 times a week.  It is not positional but occurs with activity such as playing with her dog on the floor or  exercising such as yoga.  Dizziness causes problems with balance.  No double vision.  Sometimes dizziness occurs independent from the headaches.  She tried vestibular rehab.   She has fibromyalgia and chronic low back pain and bilateral lumbar radiculopathy with known spondylosis and spinal stenosis of the lumbar spine s/p L4-5 fusion and later L3-S1 fusion revision.  She has previously been followed by surgery, pain management and neurology.  She has failed Cymbalta, gabapentin, Lyrica, Nucenta, baclofen, diclofenac, Celebrex, injections and surgery.       Past NSAIDS/analgesics:  Ibuprofen, naproxen, Excedrin, Fioricet, hydrocodone Past abortive triptans:  Relpax, sumatriptan Past abortive ergotamine:  none Past anti-emetic:  none  Past antihypertensive medications:  none Past antidepressant medications:  Nortriptyline, venlafaxine, mirtazapine, Lexapro Past anticonvulsant medications:  Topiramate, Depakote Past anti-CGRP:  none Other past therapies:  Botox (effective for 2 months and then wears off the last month)     Family history of headache:  Mom  PAST MEDICAL HISTORY: Past Medical History:  Diagnosis Date   Allergic rhinitis    Anxiety    Arthritis    Cancer    skin   Chronic pain    Chronically dry eyes    Depression    Fibromyalgia    GERD (gastroesophageal reflux disease)    Headache    Osteoporosis    Sleep apnea  Stroke    from gabapenting per pt no deficits   Vitamin D deficiency    Wears glasses     MEDICATIONS: Current Outpatient Medications on File Prior to Visit  Medication Sig Dispense Refill   acetaminophen (TYLENOL) 325 MG tablet Take 650 mg by mouth every 6 (six) hours as needed for moderate pain.     amLODipine (NORVASC) 5 MG tablet Take 5 mg by mouth daily.     baclofen (LIORESAL) 10 MG tablet Take 10 mg by mouth 3 (three) times daily. Reports taking 2-3 times daily     buPROPion (WELLBUTRIN XL) 150 MG 24 hr tablet Take 1 tablet (150 mg total)  by mouth daily. 30 tablet 1   busPIRone (BUSPAR) 15 MG tablet Take 1/3 tablet p.o. twice daily for 1 week, then take 2/3 tablet p.o. twice daily for 1 week, then take 1 tablet p.o. twice daily (Patient not taking: Reported on 09/30/2022) 60 tablet 1   Carboxymethylcellulose Sodium (THERATEARS OP) Place 1 drop into both eyes 4 (four) times daily as needed (dry eyes).     cetirizine (ZYRTEC) 10 MG tablet Take 10 mg by mouth daily as needed for allergies.     diclofenac Sodium (VOLTAREN) 1 % GEL Apply 2 g topically daily as needed (pain).     DULoxetine (CYMBALTA) 30 MG capsule Take 1 capsule (30 mg total) by mouth daily. 30 capsule 1   EPINEPHrine 0.3 mg/0.3 mL IJ SOAJ injection Inject 0.3 mg into the muscle as needed for anaphylaxis.     fluticasone (FLONASE) 50 MCG/ACT nasal spray Place 2 sprays into both nostrils 2 (two) times daily as needed for allergies or rhinitis.     lidocaine (LIDODERM) 5 % Place 1 patch onto the skin daily.     magnesium 30 MG tablet Take 30 mg by mouth at bedtime.     mirtazapine (REMERON) 15 MG tablet Take 1/2-1 tab po QHS 30 tablet 0   rizatriptan (MAXALT) 10 MG tablet Take 10 mg by mouth daily as needed for migraine.      saccharomyces boulardii (FLORASTOR) 250 MG capsule Take 250 mg by mouth daily.     sulindac (CLINORIL) 150 MG tablet Take 150 mg by mouth 2 (two) times daily.     XTAMPZA ER 13.5 MG C12A Take 13.5 mg by mouth every 12 (twelve) hours.     No current facility-administered medications on file prior to visit.    ALLERGIES: Allergies  Allergen Reactions   Gabapentin Other (See Comments)    Delirium, stroke like symptoms  Other Reaction(s): Myalgia, Other  Has stroke symptoms when taking this with percocet.     Other reaction(s): Delirium    Delirium, stroke like symptoms   Amitriptyline Other (See Comments)   Amlodipine Other (See Comments)    headaches   Polymyxin B Other (See Comments)    Eyes go blood red    Pregabalin Itching and  Rash    Itchy red rash on chest  Other Reaction(s): other   Zolpidem Rash    "sleep walk"    Cymbalta [Duloxetine Hcl] Other (See Comments)    Headaches, constipation   Fluoxetine Other (See Comments)    CONSTIPATION    Lamotrigine Rash   Other Other (See Comments)    OTOBIOTIC > RED EYES    FAMILY HISTORY: Family History  Problem Relation Age of Onset   Depression Mother    Heart attack Mother    COPD Mother    Depression Father  Heart attack Father    ADD / ADHD Son       Objective:  *** General: No acute distress.  Patient appears well-groomed.   Head:  Normocephalic/atraumatic Eyes:  Fundi examined but not visualized Heart:  Regular rate and rhythm Back: No paraspinal tenderness Neurological Exam: alert and oriented to person, place, and time.  Speech fluent and not dysarthric, language intact.  CN II-XII intact. Bulk and tone normal, muscle strength 5/5 throughout.  Sensation to light touch intact.  Deep tendon reflexes 2+ throughout.  Finger to nose testing intact.  Gait normal, Romberg negative.   Shon Millet, DO  CC: Moshe Cipro, NP

## 2022-10-06 ENCOUNTER — Ambulatory Visit: Payer: No Typology Code available for payment source

## 2022-10-07 ENCOUNTER — Encounter: Payer: Self-pay | Admitting: Neurology

## 2022-10-07 ENCOUNTER — Ambulatory Visit (INDEPENDENT_AMBULATORY_CARE_PROVIDER_SITE_OTHER): Payer: Medicare HMO | Admitting: Neurology

## 2022-10-07 VITALS — BP 140/78 | HR 72 | Ht 63.0 in | Wt 134.8 lb

## 2022-10-07 DIAGNOSIS — G43009 Migraine without aura, not intractable, without status migrainosus: Secondary | ICD-10-CM

## 2022-10-07 NOTE — Patient Instructions (Signed)
Maxalt as needed.  Try samples of Nurtec (1 tablet as needed).  Let me know how you like it

## 2022-10-08 DIAGNOSIS — M47812 Spondylosis without myelopathy or radiculopathy, cervical region: Secondary | ICD-10-CM | POA: Diagnosis not present

## 2022-10-13 ENCOUNTER — Other Ambulatory Visit: Payer: Self-pay | Admitting: *Deleted

## 2022-10-13 DIAGNOSIS — M79606 Pain in leg, unspecified: Secondary | ICD-10-CM

## 2022-10-13 DIAGNOSIS — M810 Age-related osteoporosis without current pathological fracture: Secondary | ICD-10-CM | POA: Diagnosis not present

## 2022-10-15 ENCOUNTER — Ambulatory Visit (HOSPITAL_COMMUNITY)
Admission: RE | Admit: 2022-10-15 | Discharge: 2022-10-15 | Disposition: A | Discharge status: Home / Self Care | Payer: No Typology Code available for payment source | Source: Ambulatory Visit | Attending: Vascular Surgery | Admitting: Vascular Surgery

## 2022-10-15 DIAGNOSIS — M79606 Pain in leg, unspecified: Secondary | ICD-10-CM | POA: Insufficient documentation

## 2022-10-15 DIAGNOSIS — G894 Chronic pain syndrome: Secondary | ICD-10-CM | POA: Diagnosis not present

## 2022-10-15 DIAGNOSIS — M961 Postlaminectomy syndrome, not elsewhere classified: Secondary | ICD-10-CM | POA: Diagnosis not present

## 2022-10-15 DIAGNOSIS — M5136 Other intervertebral disc degeneration, lumbar region: Secondary | ICD-10-CM | POA: Diagnosis not present

## 2022-10-15 LAB — VAS US ABI WITH/WO TBI
Left ABI: 1.21
Right ABI: 1.21

## 2022-10-16 DIAGNOSIS — M0609 Rheumatoid arthritis without rheumatoid factor, multiple sites: Secondary | ICD-10-CM | POA: Diagnosis not present

## 2022-10-16 DIAGNOSIS — Z79899 Other long term (current) drug therapy: Secondary | ICD-10-CM | POA: Diagnosis not present

## 2022-10-16 DIAGNOSIS — M159 Polyosteoarthritis, unspecified: Secondary | ICD-10-CM | POA: Diagnosis not present

## 2022-10-22 ENCOUNTER — Other Ambulatory Visit: Payer: Self-pay | Admitting: Psychiatry

## 2022-10-22 DIAGNOSIS — F339 Major depressive disorder, recurrent, unspecified: Secondary | ICD-10-CM

## 2022-10-22 DIAGNOSIS — Z5181 Encounter for therapeutic drug level monitoring: Secondary | ICD-10-CM | POA: Diagnosis not present

## 2022-10-22 DIAGNOSIS — M542 Cervicalgia: Secondary | ICD-10-CM | POA: Diagnosis not present

## 2022-10-22 DIAGNOSIS — M533 Sacrococcygeal disorders, not elsewhere classified: Secondary | ICD-10-CM | POA: Diagnosis not present

## 2022-10-22 DIAGNOSIS — M25551 Pain in right hip: Secondary | ICD-10-CM | POA: Diagnosis not present

## 2022-10-22 DIAGNOSIS — M546 Pain in thoracic spine: Secondary | ICD-10-CM | POA: Diagnosis not present

## 2022-10-22 DIAGNOSIS — M5136 Other intervertebral disc degeneration, lumbar region: Secondary | ICD-10-CM | POA: Diagnosis not present

## 2022-10-22 DIAGNOSIS — M47812 Spondylosis without myelopathy or radiculopathy, cervical region: Secondary | ICD-10-CM | POA: Diagnosis not present

## 2022-10-22 DIAGNOSIS — Z79899 Other long term (current) drug therapy: Secondary | ICD-10-CM | POA: Diagnosis not present

## 2022-10-22 DIAGNOSIS — M961 Postlaminectomy syndrome, not elsewhere classified: Secondary | ICD-10-CM | POA: Diagnosis not present

## 2022-10-22 NOTE — Telephone Encounter (Signed)
Has appt 5/15

## 2022-10-26 ENCOUNTER — Encounter: Payer: No Typology Code available for payment source | Attending: Neurology | Admitting: Dietician

## 2022-10-26 ENCOUNTER — Encounter: Payer: Self-pay | Admitting: Dietician

## 2022-10-26 ENCOUNTER — Ambulatory Visit: Payer: Medicare HMO | Admitting: Psychiatry

## 2022-10-26 VITALS — Ht 63.0 in | Wt 132.0 lb

## 2022-10-26 DIAGNOSIS — Z713 Dietary counseling and surveillance: Secondary | ICD-10-CM | POA: Diagnosis present

## 2022-10-26 NOTE — Progress Notes (Signed)
Medical Nutrition Therapy  Appointment Start time:  1055  Appointment End time:  1200 Patient is here today alone.  Primary concerns today: She would like to know what foods contribute to inflammation. She states that she feels tomatoes cause her inflammation - increased aching. Citrus causes mild itching and discomfort in her mouth and increased raw lips. Dairy causes stomach to hurt. Recently saw a speech therapist.  Muscles are week and should have fluids with meals to help food pass more easily.  No aspiration risk, just problems swallowing.  Referral diagnosis: Dysphagia and nutritional counseling for better food choices Preferred learning style: no preference indicated Learning readiness: ready, change in progress   NUTRITION ASSESSMENT   Anthropometrics  63" 132 lbs 10/26/2022 No recent weight changes   Clinical Medical Hx: depression, GERD, osteoporosis, CVA, OSA - uses c-pap, chronic pain, vitamin D deficiency Medications: see list Labs: eGFR 78, A1C 5.7% 08/2022, Vitamin D 48 04/10/2022, Cholesterol 241, LDL 159, HDL 58, Triglyceride 119 04/11/2023 Notable Signs/Symptoms: chronic pain (nerve)  Lifestyle & Dietary Hx Patient lives alone. Widow. Her daughter lives local and son in the [place] in [place]. She served 2 years in [place]. She is retired from the post office. She has moved to [facility] from [place] 10 years ago.  Estimated daily fluid intake:  Supplements: probiotic, magnesium, Vitamin D3K2, Balance of nature, vitamin B-complex Sleep: 6 hours due to chronic pain Stress / self-care: moderate - due to chronic pain (nerve pain and states that she has qualified for a spinal cord stimulator) Current average weekly physical activity: walks dog twice daily (20-30 minutes each)  24-Hr Dietary Recall First Meal: 2 slices toast, PB or almond butter, tea  Snack: occasional almonds or other nuts OR fruit Second Meal: salad with chicken at [employer] Snack: [employer]  almond cookie Third Meal: cottage cheese, applesauce, other fruit, GF almond crackers Snack: almonds or almond crackers with medication Beverages: water, black tea, unsweetened tea, occasional Dr. Reino Kent   NUTRITION DIAGNOSIS  NB-1.1 Food and nutrition-related knowledge deficit As related to balance of protein, carbohydrates and fat.  As evidenced by diet hx and patient report.   NUTRITION INTERVENTION  Nutrition education (E-1) on the following topics:  Sensitivity of nightshade vegetables in some people and what nightshade vegetables are Dairy and role on inflammation in some.  Trial off dairy for 2 weeks including foods that contain this in the ingredient list.  Reintroduce and notice how your body feels Sources of protein, particularly plant based sources as patient does not eat meat daily Mediterranean style of eating which includes fresh vegetables, fruits, whole grains and legumes Simple meal planning  Handouts Provided Include  [employer] developer of Lifestyle Medicine) packet  Learning Style & Readiness for Change Teaching method utilized: Visual & Auditory  Demonstrated degree of understanding via: Teach Back  Barriers to learning/adherence to lifestyle change: pain  Goals Established by Pt Nightshade vegetables may increase pre-existing inflammation in some people.  Sources includes:  tomatoes, eggplants, peppers, potatoes. Monitor your symptoms after consuming.  Peel potatoes prior to cooking or bake.  Try eliminating for 2 weeks and add back slowly, reintroducing a new nightshade every 3 days.  We do suspect that you do not tolerate tomatoes.  Increase your vegetable intake other than those above. Include a protein each time you eat - beans, lentils, tofu, eggs, nuts, peanut butter, almond butter, seeds, chicken, fish Avoid skipping meals Less processed foods Try no dairy for 2 weeks and see  how your body feels.  Do you notice a difference?    Stay active -  Consider PT at [employer] cookbooks [employer] and [employer] blog [employer] [facility] [employer] on [facility] - Dr. Drema Pry on You tube   MONITORING & EVALUATION Dietary intake, weekly physical activity in 2 months  Next Steps  Patient is to call for questions.

## 2022-10-26 NOTE — Patient Instructions (Addendum)
Nightshade vegetables may increase pre-existing inflammation in some people.  Sources includes:  tomatoes, eggplants, peppers, potatoes. Monitor your symptoms after consuming.  Peel potatoes prior to cooking or bake.  Try eliminating for 2 weeks and add back slowly, reintroducing a new nightshade every 3 days.  We do suspect that you do not tolerate tomatoes.  Increase your vegetable intake other than those above. Include a protein each time you eat - beans, lentils, tofu, eggs, nuts, peanut butter, almond butter, seeds, chicken, fish Avoid skipping meals Less processed foods Try no dairy for 2 weeks and see how your body feels.  Do you notice a difference?    Stay active - Consider PT at [employer] cookbooks [employer] blog [employer] on [facility] - Dr. Drema Pry on You tube

## 2022-10-27 ENCOUNTER — Encounter: Payer: Self-pay | Admitting: Vascular Surgery

## 2022-10-27 ENCOUNTER — Ambulatory Visit (INDEPENDENT_AMBULATORY_CARE_PROVIDER_SITE_OTHER): Payer: No Typology Code available for payment source | Admitting: Vascular Surgery

## 2022-10-27 VITALS — BP 138/84 | HR 66 | Temp 97.7°F | Resp 14 | Ht 63.0 in | Wt 132.0 lb

## 2022-10-27 DIAGNOSIS — M79604 Pain in right leg: Secondary | ICD-10-CM

## 2022-10-27 DIAGNOSIS — M79605 Pain in left leg: Secondary | ICD-10-CM

## 2022-10-27 NOTE — Progress Notes (Signed)
Patient name: Sharon Logan MRN: [id number] DOB: [birth year] Sex: female  REASON FOR CONSULT: Evaluate circulation   HPI: Sharon Logan is a 67 y.o. female, with history of depression, arthritis, and back pain that presents for evaluation of her circulation.  He states that her legs feel cold particularly her feet worse over the last 6 months.  She does get pain in her legs when she walks 20 to 30 minutes to the entrance of her neighborhood.  No previous vascular surgery interventions.  She does not smoke.  She is followed at the [place].  She had ABIs here that were normal and 1.21 triphasic in both lower extremities.    Past Medical History:  Diagnosis Date   Allergic rhinitis    Anxiety    Arthritis    Cancer (HCC)    skin   Chronic pain    Chronically dry eyes    Depression    Fibromyalgia    GERD (gastroesophageal reflux disease)    Headache    Osteoporosis    Sleep apnea    Stroke (HCC)    from gabapenting per pt no deficits   Vitamin D deficiency    Wears glasses     Past Surgical History:  Procedure Laterality Date   ABDOMINAL HYSTERECTOMY     BACK SURGERY     BUNIONECTOMY Bilateral    EYE SURGERY     laser surgery   NASAL SINUS SURGERY     x2   REVERSE SHOULDER ARTHROPLASTY Right 07/03/2022   Procedure: REVERSE SHOULDER ARTHROPLASTY;  Surgeon: Beverely Low, MD;  Location: [facility];  Service: Orthopedics;  Laterality: Right;  120 min choice with interscalene block   SACROILIAC JOINT FUSION Bilateral 09/12/2020   Procedure: REMOVAL OF BILATERAL PELVIC SCREWS;  Surgeon: Estill Bamberg, MD;  Location: [facility];  Service: Orthopedics;  Laterality: Bilateral;   SKIN CANCER EXCISION     face x3   TUBAL LIGATION      Family History  Problem Relation Age of Onset   Depression Mother    Heart attack Mother    COPD Mother    Depression Father    Heart attack Father    ADD / ADHD Son     SOCIAL HISTORY: Social History   Socioeconomic History   Marital status: Widowed     Spouse name: Not on file   Number of children: 2   Years of education: Not on file   Highest education level: Not on file  Occupational History   Not on file  Tobacco Use   Smoking status: Never   Smokeless tobacco: Never  Vaping Use   Vaping Use: Never used  Substance and Sexual Activity   Alcohol use: No   Drug use: No   Sexual activity: Not Currently    Birth control/protection: Surgical    Comment: Hysterectomy  Other Topics Concern   Not on file  Social History Narrative   Right handed   Lives in a one story home   Drinks Tea   Social Determinants of Health   Financial Resource Strain: Not on file  Food Insecurity: No Food Insecurity (07/03/2022)   Hunger Vital Sign    Worried About Running Out of Food in the Last Year: Never true    Ran Out of Food in the Last Year: Never true  Transportation Needs: No Transportation Needs (07/03/2022)   PRAPARE - Transportation    Lack of Transportation (Medical): No    Lack of  Transportation (Non-Medical): No  Physical Activity: Not on file  Stress: Not on file  Social Connections: Not on file  Intimate Partner Violence: Not At Risk (07/03/2022)   Humiliation, Afraid, Rape, and Kick questionnaire    Fear of Current or Ex-Partner: No    Emotionally Abused: No    Physically Abused: No    Sexually Abused: No    Allergies  Allergen Reactions   Gabapentin Other (See Comments)    Delirium, stroke like symptoms  Other Reaction(s): Myalgia, Other  Has stroke symptoms when taking this with percocet.     Other reaction(s): Delirium    Delirium, stroke like symptoms   Amitriptyline Other (See Comments)   Amlodipine Other (See Comments)    headaches   Polymyxin B Other (See Comments)    Eyes go blood red    Pregabalin Itching and Rash    Itchy red rash on chest  Other Reaction(s): other   Zolpidem Rash    "sleep walk"    Cymbalta [Duloxetine Hcl] Other (See Comments)    Headaches, constipation   Fluoxetine Other  (See Comments)    CONSTIPATION    Lamotrigine Rash   Other Other (See Comments)    OTOBIOTIC > RED EYES    Current Outpatient Medications  Medication Sig Dispense Refill   acetaminophen (TYLENOL) 325 MG tablet Take 650 mg by mouth every 6 (six) hours as needed for moderate pain.     amLODipine (NORVASC) 5 MG tablet Take 5 mg by mouth daily.     b complex vitamins capsule Take 1 capsule by mouth daily.     baclofen (LIORESAL) 10 MG tablet Take 10 mg by mouth 3 (three) times daily. Reports taking 2-3 times daily     buPROPion (WELLBUTRIN XL) 150 MG 24 hr tablet Take 1 tablet (150 mg total) by mouth daily. 30 tablet 1   cetirizine (ZYRTEC) 10 MG tablet Take 10 mg by mouth daily as needed for allergies.     diclofenac Sodium (VOLTAREN) 1 % GEL Apply 2 g topically daily as needed (pain).     DULoxetine (CYMBALTA) 30 MG capsule Take 1 capsule (30 mg total) by mouth daily. 30 capsule 1   EPINEPHrine 0.3 mg/0.3 mL IJ SOAJ injection Inject 0.3 mg into the muscle as needed for anaphylaxis.     fluticasone (FLONASE) 50 MCG/ACT nasal spray Place 2 sprays into both nostrils 2 (two) times daily as needed for allergies or rhinitis.     lidocaine (LIDODERM) 5 % Place 1 patch onto the skin daily.     magnesium 30 MG tablet Take 30 mg by mouth at bedtime.     mirtazapine (REMERON) 15 MG tablet Take 1/2-1 tab po QHS 30 tablet 0   rizatriptan (MAXALT) 10 MG tablet Take 10 mg by mouth daily as needed for migraine.      saccharomyces boulardii (FLORASTOR) 250 MG capsule Take 250 mg by mouth daily.     XTAMPZA ER 13.5 MG C12A Take 13.5 mg by mouth every 12 (twelve) hours.     busPIRone (BUSPAR) 15 MG tablet Take 1/3 tablet p.o. twice daily for 1 week, then take 2/3 tablet p.o. twice daily for 1 week, then take 1 tablet p.o. twice daily (Patient not taking: Reported on 10/26/2022) 60 tablet 1   Carboxymethylcellulose Sodium (THERATEARS OP) Place 1 drop into both eyes 4 (four) times daily as needed (dry eyes).  (Patient not taking: Reported on 10/27/2022)     sulindac (CLINORIL) 150 MG tablet  Take 150 mg by mouth 2 (two) times daily. (Patient not taking: Reported on 10/26/2022)     No current facility-administered medications for this visit.    REVIEW OF SYSTEMS:  [X]  denotes positive finding, [ ]  denotes negative finding Cardiac  Comments:  Chest pain or chest pressure:    Shortness of breath upon exertion:    Short of breath when lying flat:    Irregular heart rhythm:        Vascular    Pain in calf, thigh, or hip brought on by ambulation:    Pain in feet at night that wakes you up from your sleep:     Blood clot in your veins:    Leg swelling:         Pulmonary    Oxygen at home:    Productive cough:     Wheezing:         Neurologic    Sudden weakness in arms or legs:     Sudden numbness in arms or legs:     Sudden onset of difficulty speaking or slurred speech:    Temporary loss of vision in one eye:     Problems with dizziness:         Gastrointestinal    Blood in stool:     Vomited blood:         Genitourinary    Burning when urinating:     Blood in urine:        Psychiatric    Major depression:         Hematologic    Bleeding problems:    Problems with blood clotting too easily:        Skin    Rashes or ulcers:        Constitutional    Fever or chills:      PHYSICAL EXAM: Vitals:   10/27/22 1026  BP: 138/84  Pulse: 66  Resp: 14  Temp: 97.7 F (36.5 C)  TempSrc: Temporal  SpO2: 98%  Weight: 132 lb (59.9 kg)  Height: 5\' 3"  (1.6 m)    GENERAL: The patient is a well-nourished female, in no acute distress. The vital signs are documented above. CARDIAC: There is a regular rate and rhythm.  VASCULAR:  Palpable femoral pulses bilaterally Palpable DP pulses bilaterally Palpable PT pulses bilaterally PULMONARY: No respiratory distress. ABDOMEN: Soft and non-tender. MUSCULOSKELETAL: There are no major deformities or cyanosis. NEUROLOGIC: No focal  weakness or paresthesias are detected. SKIN: There are no ulcers or rashes noted. PSYCHIATRIC: The patient has a normal affect.  DATA:   ABIs 10/15/2022 are 1.21 on the right triphasic and 1.21 on the left triphasic with no evidence of significant arterial disease  Assessment/Plan:  67 year old female presents for evaluation of her lower extremity circulation as a referral from the [place].  She endorses the subjective feeling of coldness in her feet and lower extremities.  She also gets some pain in her legs after she walks about 20 to 30 minutes to the entrance of her neighborhood.  I discussed that her ABIs are normal with normal triphasic waveforms at the ankle and no signs of significant arterial disease.  She has a completely normal exam with palpable dorsalis pedis and posterior tibial pulses.  I do not think her symptoms are related to any significant arterial insufficiency.  I discussed she can continue exercise and walking therapies.  She can follow-up with me as needed.  Cephus Shelling, MD [facility] [place] Office: [phone]

## 2022-10-28 ENCOUNTER — Encounter: Payer: Self-pay | Admitting: Psychiatry

## 2022-10-28 ENCOUNTER — Ambulatory Visit (INDEPENDENT_AMBULATORY_CARE_PROVIDER_SITE_OTHER): Payer: Medicare HMO | Admitting: Psychiatry

## 2022-10-28 DIAGNOSIS — F339 Major depressive disorder, recurrent, unspecified: Secondary | ICD-10-CM | POA: Diagnosis not present

## 2022-10-28 DIAGNOSIS — F4310 Post-traumatic stress disorder, unspecified: Secondary | ICD-10-CM | POA: Diagnosis not present

## 2022-10-28 DIAGNOSIS — G4700 Insomnia, unspecified: Secondary | ICD-10-CM

## 2022-10-28 MED ORDER — DULOXETINE HCL 30 MG PO CPEP
30.0000 mg | ORAL_CAPSULE | Freq: Every day | ORAL | 1 refills | Status: DC
Start: 2022-10-28 — End: 2023-01-11

## 2022-10-28 MED ORDER — BUPROPION HCL ER (XL) 150 MG PO TB24
150.0000 mg | ORAL_TABLET | Freq: Every day | ORAL | 1 refills | Status: DC
Start: 2022-10-28 — End: 2022-11-26

## 2022-10-28 MED ORDER — MIRTAZAPINE 15 MG PO TABS: ORAL_TABLET | ORAL | 0 refills | Status: DC

## 2022-10-28 NOTE — Progress Notes (Signed)
Sharon Logan [id number] 1956-06-06 67 y.o.  Subjective:   Patient ID:  Sharon Logan is a 67 y.o. (DOB [birth year]) female.  Chief Complaint:  Chief Complaint  Patient presents with   Follow-up    Depression, anxiety, and insomnia    HPI [name] presents to the office today for follow-up of depression, anxiety, and insomnia. She reports that, "all in all, not bad." She reports improved energy and motivation with Wellbutrin XL. She reports that she had 1 or 2 days where she did not want to get out of bed. She reports chronic pain affects energy. She denies persistent depression- "not excited, not down in the dumps, but in the middle." She reports that she is feeling frustrated at times. She reports irritation with driving and trying to get home. She reports that she thought her anxiety "was pretty good, until I started planning this trip to [place]." She reports that she is surprised that she is having anxiety about this since she has traveled alone multiple times. She reports some recent trauma symptoms. Sleep has been "mixed." She reports, "I'm still fighting the cPap at times. She reports she has not seen improvement in sleep quality yet. She reports that it is taking 2 hours to fall asleep without Remeron. Falls asleep within 20-30 minutes after taking Remeron. She feels that her appetite is good. She reports, "it's tough to concentrate" and attributes this to pain medication. She reports that she does not enjoy reading as much as she used to due to concentration. She reports anhedonia. She has been doing some Tai Chi recently. Denies SI.   She plans on re-starting EMDR over the summer. Taking a trip to [place] in early June.  Past Medication Trials: Cymbalta- Started after back surgery. Has made her tired, even when reduced to 30 mg. Was having to take 2 hour naps. May have helped slightly with pain. Had HA, Constipation, Nausea Savella- muscle aches, rhabdomyolysis Effexor-HA, helped  with excessive sweating.  Pristiq- May have increased depression. Prozac- Constipation. Causes her to feel cold.  Celexa- Worsening depression at 20 mg dose Lexapro- "My anti-depressant of choice" and gives her energy. Was taking 2-3 years prior to surgery. Re-started one week ago. Has taken 10 mg and 20 mg doses in the past. Thinks it may have helped with pain in the past. Zoloft- worsening mood Paxil Viibryd- Felt depression was more "heavy." Wellbutrin- Did not cause HA's. Tolerated well and was effective for depression. Amitriptyline- Did not cause HA's. Weight gain. Effective.  Nortriptyline- Did not cause HA's. Effective. Lamictal- Rash Gabapentin -Rhabomyolysis Lyrica- Adverse reaction Buspar- Helpful for anxiety in the past. Well-tolerated. Tried again and had severe anxiety.  Trazodone- helped with sleep initiation but sleep was not restful. Severe dry mouth. Ambien- Parasomnia Lunesta- Tolerated, effective at 2 mg dose Remeron- Dry eyes, internal restlessness. Stomach discomfort.  Clonidine Topamax- ineffective for headaches. Caused cognitive side effects.  Hydroxyzine- Initially helped with anxiety and sleep initiation, then no longer as helpful  PHQ2-9    Flowsheet Row Nutrition from 10/26/2022 in [facility] at [facility] Visit from 09/28/2022 in [facility] Physical Medicine & Rehabilitation Office Visit from 07/27/2022 in [facility] Physical Medicine & Rehabilitation Office Visit from 05/25/2022 in [facility] Physical Medicine & Rehabilitation Office Visit from 10/06/2021 in [facility] Physical Medicine & Rehabilitation  PHQ-2 Total Score 0 0 2 0 0      Flowsheet Row Admission (Discharged) from 07/03/2022 in  [facility] Pre-Admission Testing 60 from 06/18/2022 in [facility] ED from 10/12/2021 in [facility] Emergency Department at [facility]  C-SSRS RISK  CATEGORY No Risk No Risk No Risk        Review of Systems:  Review of Systems  Musculoskeletal:  Positive for back pain. Negative for gait problem.       She reports that she had spinal cord stimulator trial and that this was positive  Neurological:  Positive for headaches.  Psychiatric/Behavioral:         Please refer to HPI    Medications: I have reviewed the patient's current medications.  Current Outpatient Medications  Medication Sig Dispense Refill   acetaminophen (TYLENOL) 325 MG tablet Take 650 mg by mouth every 6 (six) hours as needed for moderate pain.     amLODipine (NORVASC) 5 MG tablet Take 5 mg by mouth daily.     b complex vitamins capsule Take 1 capsule by mouth daily.     baclofen (LIORESAL) 10 MG tablet Take 10 mg by mouth 3 (three) times daily. Reports taking 2-3 times daily     BIOTIN PO Take by mouth.     cetirizine (ZYRTEC) 10 MG tablet Take 10 mg by mouth daily as needed for allergies.     Cholecalciferol (VITAMIN D3) 25 MCG (1000 UT) CAPS Take by mouth.     diclofenac Sodium (VOLTAREN) 1 % GEL Apply 2 g topically daily as needed (pain).     fluticasone (FLONASE) 50 MCG/ACT nasal spray Place 2 sprays into both nostrils 2 (two) times daily as needed for allergies or rhinitis.     lidocaine (LIDODERM) 5 % Place 1 patch onto the skin daily.     magnesium 30 MG tablet Take 30 mg by mouth at bedtime.     methotrexate (RHEUMATREX) 2.5 MG tablet Take by mouth.     rizatriptan (MAXALT) 10 MG tablet Take 10 mg by mouth daily as needed for migraine.      saccharomyces boulardii (FLORASTOR) 250 MG capsule Take 250 mg by mouth daily.     XTAMPZA ER 13.5 MG C12A Take 13.5 mg by mouth every 12 (twelve) hours.     buPROPion (WELLBUTRIN XL) 150 MG 24 hr tablet Take 1 tablet (150 mg total) by mouth daily. 30 tablet 1   Carboxymethylcellulose Sodium (THERATEARS OP) Place 1 drop into both eyes 4 (four) times daily as needed (dry eyes). (Patient not taking: Reported on 10/27/2022)      DULoxetine (CYMBALTA) 30 MG capsule Take 1 capsule (30 mg total) by mouth daily. 30 capsule 1   EPINEPHrine 0.3 mg/0.3 mL IJ SOAJ injection Inject 0.3 mg into the muscle as needed for anaphylaxis.     mirtazapine (REMERON) 15 MG tablet Take 1/2-1 tab po QHS 30 tablet 0   sulindac (CLINORIL) 150 MG tablet Take 150 mg by mouth 2 (two) times daily. (Patient not taking: Reported on 10/26/2022)     No current facility-administered medications for this visit.    Medication Side Effects: Other: Some possible mild irritability.   Allergies:  Allergies  Allergen Reactions   Gabapentin Other (See Comments)    Delirium, stroke like symptoms  Other Reaction(s): Myalgia, Other  Has stroke symptoms when taking this with percocet.     Other reaction(s): Delirium    Delirium, stroke like symptoms   Amitriptyline Other (See Comments)   Amlodipine Other (See Comments)    headaches   Polymyxin B  Other (See Comments)    Eyes go blood red    Pregabalin Itching and Rash    Itchy red rash on chest  Other Reaction(s): other   Zolpidem Rash    "sleep walk"    Cymbalta [Duloxetine Hcl] Other (See Comments)    Headaches, constipation   Fluoxetine Other (See Comments)    CONSTIPATION    Lamotrigine Rash   Other Other (See Comments)    OTOBIOTIC > RED EYES    Past Medical History:  Diagnosis Date   Allergic rhinitis    Anxiety    Arthritis    Cancer (HCC)    skin   Chronic pain    Chronically dry eyes    Depression    Fibromyalgia    GERD (gastroesophageal reflux disease)    Headache    Osteoporosis    Sleep apnea    Stroke (HCC)    from gabapenting per pt no deficits   Vitamin D deficiency    Wears glasses     Past Medical History, Surgical history, Social history, and Family history were reviewed and updated as appropriate.   Please see review of systems for further details on the patient's review from today.   Objective:   Physical Exam:  There were no vitals taken  for this visit.  Physical Exam Constitutional:      General: She is not in acute distress. Musculoskeletal:        General: No deformity.  Neurological:     Mental Status: She is alert and oriented to person, place, and time.     Coordination: Coordination normal.  Psychiatric:        Attention and Perception: Attention and perception normal. She does not perceive auditory or visual hallucinations.        Mood and Affect: Affect is not labile, blunt, angry or inappropriate.        Speech: Speech normal.        Behavior: Behavior normal.        Thought Content: Thought content normal. Thought content is not paranoid or delusional. Thought content does not include homicidal or suicidal ideation. Thought content does not include homicidal or suicidal plan.        Cognition and Memory: Cognition and memory normal.        Judgment: Judgment normal.     Comments: Insight intact Mood presents as less anxious and less depressed compared to recent exams.      Lab Review:     Component Value Date/Time   NA 140 07/04/2022 0407   NA 142 06/03/2021 1623   K 3.9 07/04/2022 0407   CL 109 07/04/2022 0407   CO2 26 07/04/2022 0407   GLUCOSE 95 07/04/2022 0407   BUN 15 07/04/2022 0407   BUN 21 06/03/2021 1623   CREATININE 0.73 07/04/2022 0407   CALCIUM 8.1 (L) 07/04/2022 0407   PROT 7.4 04/21/2021 0910   ALBUMIN 3.8 04/21/2021 0910   AST 26 04/21/2021 0910   ALT 21 04/21/2021 0910   ALKPHOS 120 04/21/2021 0910   BILITOT 0.7 04/21/2021 0910   GFRNONAA >60 07/04/2022 0407   GFRAA >60 06/26/2019 1008       Component Value Date/Time   WBC 7.0 06/18/2022 1036   RBC 4.71 06/18/2022 1036   HGB 10.8 (L) 07/04/2022 0407   HGB 12.1 06/03/2021 1622   HCT 32.8 (L) 07/04/2022 0407   HCT 37.6 06/03/2021 1622   PLT 294 06/18/2022 1036   PLT 336 06/03/2021  1622   MCV 94.5 06/18/2022 1036   MCV 92 06/03/2021 1622   MCH 29.9 06/18/2022 1036   MCHC 31.7 06/18/2022 1036   RDW 12.8 06/18/2022  1036   RDW 12.8 06/03/2021 1622   LYMPHSABS 2.0 06/03/2021 1622   MONOABS 0.5 04/21/2021 0910   EOSABS 0.1 06/03/2021 1622   BASOSABS 0.0 06/03/2021 1622    No results found for: "POCLITH", "LITHIUM"   No results found for: "PHENYTOIN", "PHENOBARB", "VALPROATE", "CBMZ"   .res Assessment: Plan:   Pt reports that Wellbutrin XL has been helpful for energy and motivation. Discussed that Wellbutrin XL can cause increased irritability and pt reports that she is not sure of increased irritability coincides with the start of Wellbutrin. She reports that she is also not sure if benefits outweigh risks. She reports that she would like to continue Wellbutrin XL 150 mg po qd and observe response for a longer period of time.  She reports that she would like to continue current medications without changes with upcoming trip.  Continue Duloxetine 30 mg po qd for anxiety and depression.  Continue Remeron 15 mg 1/2-1 tab po QHS for insomnia, depression, and anxiety.  Pt to follow-up with this provider in 6 weeks or sooner if clinically indicated.  Patient advised to contact office with any questions, adverse effects, or acute worsening in signs and symptoms.   I spent 30 minutes dedicated to the care of this patient on the date of this  encounter to include pre-visit review of records, face-to-face time with the patient discussing treatment plan, ordering of medication, and post visit documentation.   Sharon Logan was seen today for follow-up.  Diagnoses and all orders for this visit:  Major depression, recurrent, chronic (HCC) -     mirtazapine (REMERON) 15 MG tablet; Take 1/2-1 tab po QHS -     DULoxetine (CYMBALTA) 30 MG capsule; Take 1 capsule (30 mg total) by mouth daily. -     buPROPion (WELLBUTRIN XL) 150 MG 24 hr tablet; Take 1 tablet (150 mg total) by mouth daily.  PTSD (post-traumatic stress disorder) -     mirtazapine (REMERON) 15 MG tablet; Take 1/2-1 tab po QHS -     DULoxetine (CYMBALTA)  30 MG capsule; Take 1 capsule (30 mg total) by mouth daily.  Insomnia, unspecified type     Please see After Visit Summary for patient specific instructions.  Future Appointments  Date Time Provider Department Center  11/27/2022 11:40 AM Raulkar, Drema Pry, MD [facility] CPR  12/14/2022 11:00 AM Corie Chiquito, PMHNP CP[facility] None  01/18/2023 11:00 AM Bonnita Levan, RD [facility] NDM  01/25/2023 10:20 AM Carlis Abbott, Drema Pry, MD [facility] CPR  10/04/2023 10:30 AM Drema Dallas, DO [facility] None    No orders of the defined types were placed in this encounter.   -------------------------------

## 2022-11-02 NOTE — Therapy (Signed)
OUTPATIENT PHYSICAL THERAPY THORACOLUMBAR EVALUATION   Patient Name: Sharon Logan MRN: [id number] DOB:19-Mar-1956, 67 y.o., female Today's Date: 11/03/2022  END OF SESSION:   Past Medical History:  Diagnosis Date   Allergic rhinitis    Anxiety    Arthritis    Cancer (HCC)    skin   Chronic pain    Chronically dry eyes    Depression    Fibromyalgia    GERD (gastroesophageal reflux disease)    Headache    Osteoporosis    Sleep apnea    Stroke (HCC)    from gabapenting per pt no deficits   Vitamin D deficiency    Wears glasses    Past Surgical History:  Procedure Laterality Date   ABDOMINAL HYSTERECTOMY     BACK SURGERY     BUNIONECTOMY Bilateral    EYE SURGERY     laser surgery   NASAL SINUS SURGERY     x2   REVERSE SHOULDER ARTHROPLASTY Right 07/03/2022   Procedure: REVERSE SHOULDER ARTHROPLASTY;  Surgeon: Beverely Low, MD;  Location: [facility];  Service: Orthopedics;  Laterality: Right;  120 min choice with interscalene block   SACROILIAC JOINT FUSION Bilateral 09/12/2020   Procedure: REMOVAL OF BILATERAL PELVIC SCREWS;  Surgeon: Estill Bamberg, MD;  Location: [facility] OR;  Service: Orthopedics;  Laterality: Bilateral;   SKIN CANCER EXCISION     face x3   TUBAL LIGATION     Patient Active Problem List   Diagnosis Date Noted   S/P shoulder replacement, right 07/03/2022   Allergic contact dermatitis due to other agents 04/13/2022   S/P lumbar microdiscectomy 04/30/2021   S/P hardware removal 09/12/2020   Radiculopathy 06/29/2019   Cervical radiculopathy 08/15/2018   Chronic sinusitis 07/27/2018   Constipation 07/27/2018   Chronic bilateral low back pain with bilateral sciatica 07/01/2018   Disorder of bone and cartilage 04/04/2018   Esophageal reflux 04/04/2018   Other and unspecified hyperlipidemia 04/04/2018   Other malaise and fatigue 04/04/2018   Pain in joint involving ankle and foot 04/04/2018   Leg pain 04/04/2018   Posttraumatic stress disorder 04/04/2018    Raynaud's phenomenon 04/04/2018   Skin sensation disturbance 04/04/2018   Sleep disturbance 04/04/2018   Vitamin D deficiency 04/04/2018   Myoclonus, segmental 10/18/2017   Adjacent segment disease with spinal stenosis 10/04/2017   DDD (degenerative disc disease), lumbar 10/30/2016   Spondylolisthesis of lumbar region 08/21/2016   Overweight with body mass index (BMI) 25.0-29.9 07/22/2016   Capsulitis of metatarsophalangeal (MTP) joint of right foot 03/23/2016   Lumbar spinal stenosis 12/05/2015   Migraine 09/13/2012   Other allergic rhinitis 09/13/2012   Neuropathy 09/13/2012    PCP: Moshe Cipro NP  REFERRING PROVIDER: Moshe Cipro NP  REFERRING DIAG:  [phone] (ICD-10-CM) - Spinal stenosis, lumbar region without neurogenic claudication  M79.7 (ICD-10-CM) - Fibromyalgia  G89.4 (ICD-10-CM) - Chronic pain syndrome  M81.0 (ICD-10-CM) - Age-related osteoporosis without current pathological fracture    Rationale for Evaluation and Treatment: Rehabilitation  THERAPY DIAG:  Back pain, weakness ONSET DATE: ***  SUBJECTIVE:  SUBJECTIVE STATEMENT: ***  PERTINENT HISTORY:   Reverse TSR. Surgery on 07-03-22   L3-4 andL5-S1 PLIF, fibromyalgia, chronic pain, CA, OA,  PAIN:  PAIN:  Are you having pain? Yes NPRS scale: ***/10 Pain location: ***  Aggravating factors: *** Relieving factors: ***  PRECAUTIONS: {Therapy precautions:24002}  WEIGHT BEARING RESTRICTIONS: No  FALLS:  Has patient fallen in last 6 months? {fallsyesno:27318}  LIVING ENVIRONMENT: Lives with: {OPRC lives with:25569::"lives with their family"} Lives in: {Lives in:25570} Stairs: {opstairs:27293} Has following equipment at home: {Assistive devices:23999}  OCCUPATION: ***  PLOF: {PLOF:24004}  PATIENT  GOALS: ***  NEXT MD VISIT: ***  OBJECTIVE:   DIAGNOSTIC FINDINGS:  ***  PATIENT SURVEYS:  Modified Oswestry ***    COGNITION: Overall cognitive status: Within functional limits for tasks assessed     SENSATION: {sensation:27233}  MUSCLE LENGTH: Hamstrings: Right *** deg; Left *** deg Thomas test: Right *** deg; Left *** deg  POSTURE: {posture:25561}  PALPATION: ***  LUMBAR ROM:   AROM eval  Flexion   Extension   Right lateral flexion   Left lateral flexion   Right rotation   Left rotation    (Blank rows = not tested)  LOWER EXTREMITY ROM:     LOWER EXTREMITY MMT:    MMT Right eval Left eval  Hip flexion    Hip extension    Hip abduction    Hip adduction    Hip internal rotation    Hip external rotation    Knee flexion    Knee extension    Ankle dorsiflexion    Ankle plantarflexion    Ankle inversion    Ankle eversion     (Blank rows = not tested)  LUMBAR SPECIAL TESTS:  {lumbar special test:25242}  FUNCTIONAL TESTS:  {Functional tests:24029}  GAIT: Distance walked: *** Assistive device utilized: {Assistive devices:23999} Level of assistance: {Levels of assistance:24026} Comments: ***  TODAY'S TREATMENT:                                                                                                                              DATE: 11/03/22    PATIENT EDUCATION:  Education details: Educated patient on anatomy and physiology of current symptoms, prognosis, plan of care as well as initial self care strategies to promote recovery  Person educated: Patient Education method: Explanation Education comprehension: verbalized understanding  HOME EXERCISE PROGRAM: ***  ASSESSMENT:  CLINICAL IMPRESSION: Patient is a 67 y.o. female who was seen today for physical therapy evaluation and treatment for ***.   OBJECTIVE IMPAIRMENTS: {opptimpairments:25111}.   ACTIVITY LIMITATIONS: {activitylimitations:27494}  PARTICIPATION LIMITATIONS:  {participationrestrictions:25113}  PERSONAL FACTORS: 3+ comorbidities: *** are also affecting patient's functional outcome.   REHAB POTENTIAL: Good  CLINICAL DECISION MAKING: Evolving/moderate complexity  EVALUATION COMPLEXITY: Moderate   GOALS: Goals reviewed with patient? Yes  SHORT TERM GOALS: Target date: ***  The patient will demonstrate knowledge of basic self care strategies and exercises to promote healing  Baseline: Goal status: INITIAL  2.  *** Baseline:  Goal status: INITIAL  3.  *** Baseline:  Goal status: INITIAL  4.  *** Baseline:  Goal status: INITIAL  5.  *** Baseline:  Goal status: INITIAL   LONG TERM GOALS: Target date: ***  The patient will be independent in a safe self progression of a home exercise program to promote further recovery of function  Baseline:  Goal status: INITIAL  2.  *** Baseline:  Goal status: INITIAL  3.  *** Baseline:  Goal status: INITIAL  4.  *** Baseline:  Goal status: INITIAL  5.  *** Baseline:  Goal status: INITIAL    PLAN:  PT FREQUENCY: {rehab frequency:25116}  PT DURATION: {rehab duration:25117}  PLANNED INTERVENTIONS: {rehab planned interventions:25118::"Therapeutic exercises","Therapeutic activity","Neuromuscular re-education","Balance training","Gait training","Patient/Family education","Self Care","Joint mobilization"}.  PLAN FOR NEXT SESSION: ***

## 2022-11-03 ENCOUNTER — Ambulatory Visit: Payer: Medicare HMO | Attending: Family Medicine | Admitting: Physical Therapy

## 2022-11-03 ENCOUNTER — Encounter: Payer: Self-pay | Admitting: Physical Therapy

## 2022-11-03 ENCOUNTER — Other Ambulatory Visit: Payer: Self-pay

## 2022-11-03 DIAGNOSIS — M5459 Other low back pain: Secondary | ICD-10-CM | POA: Diagnosis not present

## 2022-11-03 DIAGNOSIS — M6281 Muscle weakness (generalized): Secondary | ICD-10-CM | POA: Diagnosis not present

## 2022-11-03 DIAGNOSIS — M810 Age-related osteoporosis without current pathological fracture: Secondary | ICD-10-CM | POA: Diagnosis not present

## 2022-11-04 DIAGNOSIS — F3341 Major depressive disorder, recurrent, in partial remission: Secondary | ICD-10-CM | POA: Diagnosis not present

## 2022-11-04 DIAGNOSIS — M542 Cervicalgia: Secondary | ICD-10-CM | POA: Diagnosis not present

## 2022-11-04 DIAGNOSIS — I10 Essential (primary) hypertension: Secondary | ICD-10-CM | POA: Diagnosis not present

## 2022-11-05 DIAGNOSIS — M0609 Rheumatoid arthritis without rheumatoid factor, multiple sites: Secondary | ICD-10-CM | POA: Diagnosis not present

## 2022-11-05 DIAGNOSIS — Z79899 Other long term (current) drug therapy: Secondary | ICD-10-CM | POA: Diagnosis not present

## 2022-11-10 DIAGNOSIS — G894 Chronic pain syndrome: Secondary | ICD-10-CM | POA: Diagnosis not present

## 2022-11-10 DIAGNOSIS — M961 Postlaminectomy syndrome, not elsewhere classified: Secondary | ICD-10-CM | POA: Diagnosis not present

## 2022-11-10 DIAGNOSIS — M5136 Other intervertebral disc degeneration, lumbar region: Secondary | ICD-10-CM | POA: Diagnosis not present

## 2022-11-12 ENCOUNTER — Ambulatory Visit: Payer: Medicare HMO | Admitting: Physical Therapy

## 2022-11-12 ENCOUNTER — Encounter: Payer: Self-pay | Admitting: Physical Therapy

## 2022-11-12 DIAGNOSIS — M6281 Muscle weakness (generalized): Secondary | ICD-10-CM | POA: Diagnosis not present

## 2022-11-12 DIAGNOSIS — M810 Age-related osteoporosis without current pathological fracture: Secondary | ICD-10-CM | POA: Diagnosis not present

## 2022-11-12 DIAGNOSIS — M5459 Other low back pain: Secondary | ICD-10-CM

## 2022-11-12 NOTE — Therapy (Signed)
OUTPATIENT PHYSICAL THERAPY THORACOLUMBAR TREATMENT   Patient Name: Sharon Logan MRN: [id number] DOB:December 12, 1955, 67 y.o., female Today's Date: 11/03/2022  END OF SESSION:  PT End of Session - 11/12/22 1228     Visit Number 2    Date for PT Re-Evaluation 12/29/22    Authorization Type [employer]    PT Start Time 1230    PT Stop Time 1315    PT Time Calculation (min) 45 min    Activity Tolerance Patient tolerated treatment well    Behavior During Therapy WFL for tasks assessed/performed              Past Medical History:  Diagnosis Date   Allergic rhinitis    Anxiety    Arthritis    Cancer (HCC)    skin   Chronic pain    Chronically dry eyes    Depression    Fibromyalgia    GERD (gastroesophageal reflux disease)    Headache    Osteoporosis    Sleep apnea    Stroke (HCC)    from gabapenting per pt no deficits   Vitamin D deficiency    Wears glasses    Past Surgical History:  Procedure Laterality Date   ABDOMINAL HYSTERECTOMY     BACK SURGERY     BUNIONECTOMY Bilateral    EYE SURGERY     laser surgery   NASAL SINUS SURGERY     x2   REVERSE SHOULDER ARTHROPLASTY Right 07/03/2022   Procedure: REVERSE SHOULDER ARTHROPLASTY;  Surgeon: Beverely Low, MD;  Location: [facility];  Service: Orthopedics;  Laterality: Right;  120 min choice with interscalene block   SACROILIAC JOINT FUSION Bilateral 09/12/2020   Procedure: REMOVAL OF BILATERAL PELVIC SCREWS;  Surgeon: Estill Bamberg, MD;  Location: [facility];  Service: Orthopedics;  Laterality: Bilateral;   SKIN CANCER EXCISION     face x3   TUBAL LIGATION     Patient Active Problem List   Diagnosis Date Noted   S/P shoulder replacement, right 07/03/2022   Allergic contact dermatitis due to other agents 04/13/2022   S/P lumbar microdiscectomy 04/30/2021   S/P hardware removal 09/12/2020   Radiculopathy 06/29/2019   Cervical radiculopathy 08/15/2018   Chronic sinusitis 07/27/2018   Constipation 07/27/2018    Chronic bilateral low back pain with bilateral sciatica 07/01/2018   Disorder of bone and cartilage 04/04/2018   Esophageal reflux 04/04/2018   Other and unspecified hyperlipidemia 04/04/2018   Other malaise and fatigue 04/04/2018   Pain in joint involving ankle and foot 04/04/2018   Leg pain 04/04/2018   Posttraumatic stress disorder 04/04/2018   Raynaud's phenomenon 04/04/2018   Skin sensation disturbance 04/04/2018   Sleep disturbance 04/04/2018   Vitamin D deficiency 04/04/2018   Myoclonus, segmental 10/18/2017   Adjacent segment disease with spinal stenosis 10/04/2017   DDD (degenerative disc disease), lumbar 10/30/2016   Spondylolisthesis of lumbar region 08/21/2016   Overweight with body mass index (BMI) 25.0-29.9 07/22/2016   Capsulitis of metatarsophalangeal (MTP) joint of right foot 03/23/2016   Lumbar spinal stenosis 12/05/2015   Migraine 09/13/2012   Other allergic rhinitis 09/13/2012   Neuropathy 09/13/2012    PCP: Moshe Cipro NP  REFERRING PROVIDER: Moshe Cipro NP  REFERRING DIAG:  [phone] (ICD-10-CM) - Spinal stenosis, lumbar region without neurogenic claudication  M79.7 (ICD-10-CM) - Fibromyalgia  G89.4 (ICD-10-CM) - Chronic pain syndrome  M81.0 (ICD-10-CM) - Age-related osteoporosis without current pathological fracture    Rationale for Evaluation and Treatment: Rehabilitation  THERAPY DIAG:  Back pain, weakness, osteoporosis  ONSET DATE: 06/15/2022 (exacerbation of chronic problem)  SUBJECTIVE:                                                                                                                                                                                           SUBJECTIVE STATEMENT: I got my spinal cord stimulator 2 days ago and already feel about 45% less nerve pain into my legs.  I was doing HEP well until it was placed and now have some pain preventing me from doing them.  PERTINENT HISTORY:  Upcoming spinal cord  stimulator next week (May) no aquatic PT for 4 months  Reverse TSR. Surgery on 07-03-22   L3-4 andL5-S1 PLIF 2023; fibromyalgia, chronic pain,  OA,  osteoporosis (unsure what areas are impacted) depression PAIN:  PAIN:  Are you having pain? Yes NPRS scale: 4/10  generally it's higher than that, constant Pain location: both legs and arms  Aggravating factors: stood too long, walk extra, extra housecleaning (energy depleting) Relieving factors: putting feet up with sitting or lying down for 20 min  PRECAUTIONS: None  WEIGHT BEARING RESTRICTIONS: No  FALLS:  Has patient fallen in last 6 months? No my balance could be better though  LIVING ENVIRONMENT: Lives in: House/apartment Stairs: 1 step to enter   OCCUPATION: retired Enjoys walking, reading, listen to music, tend to plants on deck with hose Has a black lab  PLOF: Independent  PATIENT GOALS: 4 day bus trip in October; I'd like to walk in the [place] and feel mobile   NEXT MD VISIT: every 6 weeks  OBJECTIVE:   DIAGNOSTIC FINDINGS:  Pt will look for Dexa scan results in her records   PATIENT SURVEYS:  Modified Oswestry 30% moderate self perceived disability   COGNITION: Overall cognitive status: Within functional limits for tasks assessed      MUSCLE LENGTH: Dec bil hip flexor lengths: 8 degrees bil of hip extension  POSTURE: decreased lumbar lordosis and increased thoracic kyphosis   LUMBAR ROM:  Uses a squat method to reach floor with ease; unable to lie prone  AROM eval  Flexion WFLs  Extension 5 degrees  Right lateral flexion   Left lateral flexion   Right rotation   Left rotation    (Blank rows = not tested)  TRUNK STRENGTH:  Decreased activation of transverse abdominus muscles; abdominals 4-/5; decreased activation of lumbar multifidi; trunk extensors 4-/5  LOWER EXTREMITY ROM:   WFLs  LOWER EXTREMITY MMT:  SLS left 3 sec; right SLS 1 sec with pelvic drop;  quadruped bird dog pelvic drop  and compensatory rotation right/left Frequent HS  cramps during movement testing  MMT Right eval Left eval  Hip flexion 4 4  Hip extension 4- 4-  Hip abduction 3+ 3+  Hip adduction    Hip internal rotation    Hip external rotation 4- 4-  Knee flexion 4 4  Knee extension 4 4  Ankle dorsiflexion    Ankle plantarflexion    Ankle inversion    Ankle eversion     (Blank rows = not tested)   FUNCTIONAL TESTS:  Able to rise from standard height chair without UE assist  GAIT: 11/12/22: : 1,303 feet Comments: no assistive device needed  TODAY'S TREATMENT:                                                                                                                              DATE:  11/12/22: NuStep L5 x 4' PT present to discuss plan for session Pt education provided throughout session for benefits of loading and WB for bone health, slowing down reps to fully engage target muscle, reason for use of hip hinge to keep back straight with seated HS stretch and sit to stand Counter heel toe raise x10 Counter alt Lt/Rt hip abd and ext x 10 each LE Sidestepping with yellow loop at ankles 2 passes down and back along counter (gave tied green band for HEP) Standing green bil row and shoulder extension 1,303 feet Sit to stand with hip hinge and focus on eccentric landing control x 10 Add bil 3lb UE weights to sit to stand with chest press x 5 Bird dog - opp arm/leg very challenging so doing arm only, leg only for now x 5 rounds Leg press 60lb bil LE x20 seat 6 recline 3 HEP updated and issued  11/03/22  Initial HEP   PATIENT EDUCATION:  Education details: Educated patient on anatomy and physiology of current symptoms, prognosis, plan of care as well as initial self care strategies to promote recovery  Person educated: Patient Education method: Explanation Education comprehension: verbalized understanding  HOME EXERCISE PROGRAM: Access Code: [email or web address] URL:  [email or web address] Date: 11/12/2022 Prepared by: Loistine Simas Yerlin Gasparyan  Exercises - Clamshell  - 1 x daily - 7 x weekly - 1 sets - 10 reps - Sidelying Hip Abduction  - 1 x daily - 7 x weekly - 1 sets - 10 reps - Hip Flexor Stretch at Edge of Bed  - 1 x daily - 7 x weekly - 1 sets - 2-3 reps - 30 hold - Heel Toe Raises with Counter Support  - 1 x daily - 7 x weekly - 3 sets - 10 reps - Standing Hip Abduction with Counter Support  - 1 x daily - 7 x weekly - 3 sets - 10 reps - Standing Hip Extension with Counter Support  - 1 x daily - 7 x weekly - 3 sets - 10 reps - Side Stepping with Resistance at Ankles and Counter Support  - 1 x daily -  7 x weekly - 3 sets - 10 reps - Standing Row with Anchored Resistance  - 1 x daily - 7 x weekly - 3 sets - 10 reps - Standing Shoulder Extension with Resistance  - 1 x daily - 7 x weekly - 3 sets - 10 reps - Sit to Stand Without Arm Support  - 1 x daily - 7 x weekly - 2 sets - 10 reps - Bird Dog  - 1 x daily - 7 x weekly - 1 sets - 5 reps - Seated Hamstring Stretch  - 1 x daily - 7 x weekly - 1 sets - 2-3 reps - 20-30 sec hold  ASSESSMENT:  CLINICAL IMPRESSION: Patient arrives for first time follow up visit.  She reports she got a spinal cord stimulator 2 days ago and already reports 45% less bil LE nerve pain.  She did say since stimulator was placed she was unable to do initial HEP due to Rt hip pain.  She covered 1,303 feet with initial today.  She was able to participate in standing counter and standing upper body tband therex today with progression of HEP.  We added 3lb bil chest press to sit to stand given she has these at home.  Egbert Garibaldi dog was very challenging for stability so performing as single leg and single arm indiv for now.  Pt reported today's level of challenge was "just right and comfortable" end of session.   OBJECTIVE IMPAIRMENTS: decreased activity tolerance, decreased strength, impaired perceived functional ability, and pain.    ACTIVITY LIMITATIONS: carrying, lifting, bending, standing, and locomotion level  PARTICIPATION LIMITATIONS: cleaning, community activity, and yard work  PERSONAL FACTORS: 3+ comorbidities: multi regions affected, fibromyalgia, time since onset , osteoporosis are also affecting patient's functional outcome.   REHAB POTENTIAL: Good  CLINICAL DECISION MAKING: Evolving/moderate complexity  EVALUATION COMPLEXITY: Moderate   GOALS: Goals reviewed with patient? Yes  SHORT TERM GOALS: Target date: 12/01/2022    The patient will demonstrate knowledge of basic self care strategies, osteoporosis education and exercises to promote health Baseline: Goal status: ongoing  2.  The patient will have improved bil hip strength to at least 4/5 needed for standing, walking longer distances and descending stairs at home and in the community  Baseline:  Goal status: INITIAL  3.  The patient will have improved trunk flexor and extensor muscle strength to at least 4/5 needed for lifting medium weight objects such as grocery bags Baseline:  Goal status: INITIAL  4.  Patient will demo hip hinge and squat needed for watering her plants  Baseline:  Goal status: ongoing    LONG TERM GOALS: Target date: 12/29/2022    The patient will be independent in a safe self progression of a home exercise program to promote further recovery of function  Baseline:  Goal status: INITIAL  2.  The patient will report a 50% improvement in pain levels with functional activities which are currently difficult including standing and walking for longer periods of time and housework Baseline:  Goal status: INITIAL  3.  The patient will have improved trunk flexor and extensor muscle strength to at least 4+/5 needed for lifting medium weight objects such as  luggage for bus trip to the [place] Baseline:  Goal status: INITIAL  4.  The patient will have improved gait stamina and speed needed to ambulate 800  feet in 6 minutes  Baseline:  Goal status: INITIAL  5.  Modified Oswestry improved to 24% indicating improved  function with less pain Baseline:  Goal status: INITIAL    PLAN:  PT FREQUENCY: 2x/week  PT DURATION: 8 weeks  PLANNED INTERVENTIONS: Therapeutic exercises, Therapeutic activity, Neuromuscular re-education, Balance training, Gait training, Patient/Family education, Self Care, Joint mobilization, Aquatic Therapy, Dry Needling, Moist heat, Taping, Manual therapy, and Re-evaluation.  PLAN FOR NEXT SESSION: review initial HEP: supine hip flexor stretch, sidelying clam and sidelying hip abduction; work on indiv limb movements for bird dogs and combine when ready, try Nu-Step, leg press, hip machine (pt doing limited gym program until April);  u osteoporosis education: progressive resistance training, impact training, balance exercise  [employer], PT 11/12/22 1:21 PM  Phone: [phone] Fax: [phone]

## 2022-11-13 DIAGNOSIS — J019 Acute sinusitis, unspecified: Secondary | ICD-10-CM | POA: Diagnosis not present

## 2022-11-13 DIAGNOSIS — Z6824 Body mass index (BMI) 24.0-24.9, adult: Secondary | ICD-10-CM | POA: Diagnosis not present

## 2022-11-23 DIAGNOSIS — M542 Cervicalgia: Secondary | ICD-10-CM | POA: Diagnosis not present

## 2022-11-23 DIAGNOSIS — M25551 Pain in right hip: Secondary | ICD-10-CM | POA: Diagnosis not present

## 2022-11-23 DIAGNOSIS — M533 Sacrococcygeal disorders, not elsewhere classified: Secondary | ICD-10-CM | POA: Diagnosis not present

## 2022-11-23 DIAGNOSIS — M961 Postlaminectomy syndrome, not elsewhere classified: Secondary | ICD-10-CM | POA: Diagnosis not present

## 2022-11-23 DIAGNOSIS — M546 Pain in thoracic spine: Secondary | ICD-10-CM | POA: Diagnosis not present

## 2022-11-23 DIAGNOSIS — G894 Chronic pain syndrome: Secondary | ICD-10-CM | POA: Diagnosis not present

## 2022-11-23 DIAGNOSIS — M47812 Spondylosis without myelopathy or radiculopathy, cervical region: Secondary | ICD-10-CM | POA: Diagnosis not present

## 2022-11-23 DIAGNOSIS — M5136 Other intervertebral disc degeneration, lumbar region: Secondary | ICD-10-CM | POA: Diagnosis not present

## 2022-11-25 ENCOUNTER — Ambulatory Visit: Payer: Medicare HMO | Attending: Family Medicine

## 2022-11-25 DIAGNOSIS — M6281 Muscle weakness (generalized): Secondary | ICD-10-CM | POA: Insufficient documentation

## 2022-11-25 DIAGNOSIS — M810 Age-related osteoporosis without current pathological fracture: Secondary | ICD-10-CM | POA: Diagnosis not present

## 2022-11-25 DIAGNOSIS — M542 Cervicalgia: Secondary | ICD-10-CM | POA: Diagnosis not present

## 2022-11-25 DIAGNOSIS — M5459 Other low back pain: Secondary | ICD-10-CM | POA: Diagnosis not present

## 2022-11-25 NOTE — Therapy (Signed)
OUTPATIENT PHYSICAL THERAPY THORACOLUMBAR TREATMENT   Patient Name: Sharon Logan MRN: [id number] DOB:[birth year], 67 y.o., female Today's Date: 11/03/2022  END OF SESSION:  PT End of Session - 11/25/22 0839     Visit Number 3    Date for PT Re-Evaluation 12/29/22    Authorization Type [employer]    PT Start Time 0801    PT Stop Time 0840    PT Time Calculation (min) 39 min    Activity Tolerance Patient tolerated treatment well    Behavior During Therapy WFL for tasks assessed/performed               Past Medical History:  Diagnosis Date   Allergic rhinitis    Anxiety    Arthritis    Cancer (HCC)    skin   Chronic pain    Chronically dry eyes    Depression    Fibromyalgia    GERD (gastroesophageal reflux disease)    Headache    Osteoporosis    Sleep apnea    Stroke (HCC)    from gabapenting per pt no deficits   Vitamin D deficiency    Wears glasses    Past Surgical History:  Procedure Laterality Date   ABDOMINAL HYSTERECTOMY     BACK SURGERY     BUNIONECTOMY Bilateral    EYE SURGERY     laser surgery   NASAL SINUS SURGERY     x2   REVERSE SHOULDER ARTHROPLASTY Right 07/03/2022   Procedure: REVERSE SHOULDER ARTHROPLASTY;  Surgeon: Beverely Low, MD;  Location: [facility];  Service: Orthopedics;  Laterality: Right;  120 min choice with interscalene block   SACROILIAC JOINT FUSION Bilateral 09/12/2020   Procedure: REMOVAL OF BILATERAL PELVIC SCREWS;  Surgeon: Estill Bamberg, MD;  Location: [facility];  Service: Orthopedics;  Laterality: Bilateral;   SKIN CANCER EXCISION     face x3   TUBAL LIGATION     Patient Active Problem List   Diagnosis Date Noted   S/P shoulder replacement, right 07/03/2022   Allergic contact dermatitis due to other agents 04/13/2022   S/P lumbar microdiscectomy 04/30/2021   S/P hardware removal 09/12/2020   Radiculopathy 06/29/2019   Cervical radiculopathy 08/15/2018   Chronic sinusitis 07/27/2018   Constipation 07/27/2018    Chronic bilateral low back pain with bilateral sciatica 07/01/2018   Disorder of bone and cartilage 04/04/2018   Esophageal reflux 04/04/2018   Other and unspecified hyperlipidemia 04/04/2018   Other malaise and fatigue 04/04/2018   Pain in joint involving ankle and foot 04/04/2018   Leg pain 04/04/2018   Posttraumatic stress disorder 04/04/2018   Raynaud's phenomenon 04/04/2018   Skin sensation disturbance 04/04/2018   Sleep disturbance 04/04/2018   Vitamin D deficiency 04/04/2018   Myoclonus, segmental 10/18/2017   Adjacent segment disease with spinal stenosis 10/04/2017   DDD (degenerative disc disease), lumbar 10/30/2016   Spondylolisthesis of lumbar region 08/21/2016   Overweight with body mass index (BMI) 25.0-29.9 07/22/2016   Capsulitis of metatarsophalangeal (MTP) joint of right foot 03/23/2016   Lumbar spinal stenosis 12/05/2015   Migraine 09/13/2012   Other allergic rhinitis 09/13/2012   Neuropathy 09/13/2012    PCP: Moshe Cipro NP  REFERRING PROVIDER: Moshe Cipro NP  REFERRING DIAG:  [phone] (ICD-10-CM) - Spinal stenosis, lumbar region without neurogenic claudication  M79.7 (ICD-10-CM) - Fibromyalgia  G89.4 (ICD-10-CM) - Chronic pain syndrome  M81.0 (ICD-10-CM) - Age-related osteoporosis without current pathological fracture    Rationale for Evaluation and Treatment: Rehabilitation  THERAPY DIAG:  Back pain, weakness, osteoporosis  ONSET DATE: 06/15/2022 (exacerbation of chronic problem)  SUBJECTIVE:                                                                                                                                                                                           SUBJECTIVE STATEMENT: I am doing good with the spinal cord stimulator.    PERTINENT HISTORY:  Upcoming spinal cord stimulator next week (May) no aquatic PT for 4 months  Reverse TSR. Surgery on 07-03-22   L3-4 andL5-S1 PLIF 2023; fibromyalgia, chronic pain,  OA,   osteoporosis (unsure what areas are impacted) Depression Osteoporosis: T score: -2.9 proximal femur, forearm -1.6 PAIN:  PAIN:  Are you having pain? Yes NPRS scale: 0/10 Pain location: both legs and arms  Aggravating factors: stood too long, walk extra, extra housecleaning (energy depleting) Relieving factors: putting feet up with sitting or lying down for 20 min  PRECAUTIONS: None  WEIGHT BEARING RESTRICTIONS: No  FALLS:  Has patient fallen in last 6 months? No my balance could be better though  LIVING ENVIRONMENT: Lives in: House/apartment Stairs: 1 step to enter   OCCUPATION: retired Enjoys walking, reading, listen to music, tend to plants on deck with hose Has a black lab  PLOF: Independent  PATIENT GOALS: 4 day bus trip in October; I'd like to walk in the [place] and feel mobile   NEXT MD VISIT: every 6 weeks  OBJECTIVE:   DIAGNOSTIC FINDINGS:  Pt will look for Dexa scan results in her records   PATIENT SURVEYS:  Modified Oswestry 30% moderate self perceived disability   COGNITION: Overall cognitive status: Within functional limits for tasks assessed      MUSCLE LENGTH: Dec bil hip flexor lengths: 8 degrees bil of hip extension  POSTURE: decreased lumbar lordosis and increased thoracic kyphosis   LUMBAR ROM:  Uses a squat method to reach floor with ease; unable to lie prone  AROM eval  Flexion WFLs  Extension 5 degrees  Right lateral flexion   Left lateral flexion   Right rotation   Left rotation    (Blank rows = not tested)  TRUNK STRENGTH:  Decreased activation of transverse abdominus muscles; abdominals 4-/5; decreased activation of lumbar multifidi; trunk extensors 4-/5  LOWER EXTREMITY ROM:   WFLs  LOWER EXTREMITY MMT:  SLS left 3 sec; right SLS 1 sec with pelvic drop;  quadruped bird dog pelvic drop and compensatory rotation right/left Frequent HS cramps during movement testing  MMT Right eval Left eval  Hip flexion 4 4  Hip  extension 4- 4-  Hip abduction 3+ 3+  Hip adduction  Hip internal rotation    Hip external rotation 4- 4-  Knee flexion 4 4  Knee extension 4 4  Ankle dorsiflexion    Ankle plantarflexion    Ankle inversion    Ankle eversion     (Blank rows = not tested)   FUNCTIONAL TESTS:  Able to rise from standard height chair without UE assist  GAIT: 11/12/22: : 1,303 feet Comments: no assistive device needed  TODAY'S TREATMENT:                                                                                                                              DATE:  11/25/22: NuStep L5 x 6' PT present to discuss plan for session Counter heel/toe raise 2x10-standing on balance pad Hip abduction and extension 2x10 bil- standing on balance pad Sidestepping with yellow loop at ankles 2 passes down and back along counter (gave tied green band for HEP) Standing green bil row and shoulder extension Sit to stand with hip hinge and focus on eccentric landing control 5# kettle bell 2x10 Add bil 3lb UE weights to sit to stand with chest press x 5 Seated hamstring stretch: 3x20 seconds Seated figure 3x20 seconds   Leg press 60lb bil LE x20 seat 6 recline 3 11/12/22: NuStep L5 x 4' PT present to discuss plan for session Pt education provided throughout session for benefits of loading and WB for bone health, slowing down reps to fully engage target muscle, reason for use of hip hinge to keep back straight with seated HS stretch and sit to stand Counter heel toe raise x10 Counter alt Lt/Rt hip abd and ext x 10 each LE Sidestepping with yellow loop at ankles 2 passes down and back along counter (gave tied green band for HEP) Standing green bil row and shoulder extension 1,303 feet Sit to stand with hip hinge and focus on eccentric landing control x 10 Add bil 3lb UE weights to sit to stand with chest press x 5 Bird dog - opp arm/leg very challenging so doing arm only, leg only for now x 5  rounds Leg press 60lb bil LE x20 seat 6 recline 3 HEP updated and issued  11/03/22  Initial HEP   PATIENT EDUCATION:  Education details: Educated patient on anatomy and physiology of current symptoms, prognosis, plan of care as well as initial self care strategies to promote recovery  Person educated: Patient Education method: Explanation Education comprehension: verbalized understanding  HOME EXERCISE PROGRAM: Access Code: [email or web address] URL: [email or web address] Date: 11/12/2022 Prepared by: Loistine Simas Beuhring  Exercises - Clamshell  - 1 x daily - 7 x weekly - 1 sets - 10 reps - Sidelying Hip Abduction  - 1 x daily - 7 x weekly - 1 sets - 10 reps - Hip Flexor Stretch at Edge of Bed  - 1 x daily - 7 x weekly - 1 sets - 2-3 reps - 30 hold - Heel Toe Raises with Counter Support  -  1 x daily - 7 x weekly - 3 sets - 10 reps - Standing Hip Abduction with Counter Support  - 1 x daily - 7 x weekly - 3 sets - 10 reps - Standing Hip Extension with Counter Support  - 1 x daily - 7 x weekly - 3 sets - 10 reps - Side Stepping with Resistance at Ankles and Counter Support  - 1 x daily - 7 x weekly - 3 sets - 10 reps - Standing Row with Anchored Resistance  - 1 x daily - 7 x weekly - 3 sets - 10 reps - Standing Shoulder Extension with Resistance  - 1 x daily - 7 x weekly - 3 sets - 10 reps - Sit to Stand Without Arm Support  - 1 x daily - 7 x weekly - 2 sets - 10 reps - Bird Dog  - 1 x daily - 7 x weekly - 1 sets - 5 reps - Seated Hamstring Stretch  - 1 x daily - 7 x weekly - 1 sets - 2-3 reps - 20-30 sec hold  ASSESSMENT:  CLINICAL IMPRESSION: Pt arrives pain free today.  She has been able to resume exercise at home after spinal cord stimulator placement.  Pt did well with addition of challenge with balance pad today and PT provided cueing for alignment and speed.  Pt reports that she does not see any significant difference in her ability to stand or walk at this time.  She hopes to  work on increasing her distance with walking.  Pt was appropriately challenged with current level of activity. Patient will benefit from skilled PT to address the below impairments and improve overall function.   OBJECTIVE IMPAIRMENTS: decreased activity tolerance, decreased strength, impaired perceived functional ability, and pain.   ACTIVITY LIMITATIONS: carrying, lifting, bending, standing, and locomotion level  PARTICIPATION LIMITATIONS: cleaning, community activity, and yard work  PERSONAL FACTORS: 3+ comorbidities: multi regions affected, fibromyalgia, time since onset , osteoporosis are also affecting patient's functional outcome.   REHAB POTENTIAL: Good  CLINICAL DECISION MAKING: Evolving/moderate complexity  EVALUATION COMPLEXITY: Moderate   GOALS: Goals reviewed with patient? Yes  SHORT TERM GOALS: Target date: 12/01/2022    The patient will demonstrate knowledge of basic self care strategies, osteoporosis education and exercises to promote health Baseline: Goal status: ongoing  2.  The patient will have improved bil hip strength to at least 4/5 needed for standing, walking longer distances and descending stairs at home and in the community  Baseline:  Goal status: INITIAL  3.  The patient will have improved trunk flexor and extensor muscle strength to at least 4/5 needed for lifting medium weight objects such as grocery bags Baseline:  Goal status: INITIAL  4.  Patient will demo hip hinge and squat needed for watering her plants  Baseline:  Goal status: ongoing    LONG TERM GOALS: Target date: 12/29/2022    The patient will be independent in a safe self progression of a home exercise program to promote further recovery of function  Baseline:  Goal status: INITIAL  2.  The patient will report a 50% improvement in pain levels with functional activities which are currently difficult including standing and walking for longer periods of time and  housework Baseline:  no pain reported today, this varies (11/25/22) Goal status: IN PROGRESS  3.  The patient will have improved trunk flexor and extensor muscle strength to at least 4+/5 needed for lifting medium weight objects such as  luggage for bus trip [place] Baseline:  Goal status: INITIAL  4.  The patient will have improved gait stamina and speed needed to ambulate 800 feet in 6 minutes  Baseline: 1303 (11/12/22) Goal status: MET  5.  Modified Oswestry improved to 24% indicating improved function with less pain Baseline:  Goal status: INITIAL    PLAN:  PT FREQUENCY: 2x/week  PT DURATION: 8 weeks  PLANNED INTERVENTIONS: Therapeutic exercises, Therapeutic activity, Neuromuscular re-education, Balance training, Gait training, Patient/Family education, Self Care, Joint mobilization, Aquatic Therapy, Dry Needling, Moist heat, Taping, Manual therapy, and Re-evaluation.  PLAN FOR NEXT SESSION: continue with weight training and added balance challenge.   Lorrene Reid, PT 11/25/22 8:40 AM   Phone: [phone] Fax: [phone]

## 2022-11-26 ENCOUNTER — Ambulatory Visit (INDEPENDENT_AMBULATORY_CARE_PROVIDER_SITE_OTHER): Payer: No Typology Code available for payment source | Admitting: Psychiatry

## 2022-11-26 ENCOUNTER — Encounter: Payer: Self-pay | Admitting: Psychiatry

## 2022-11-26 DIAGNOSIS — G4700 Insomnia, unspecified: Secondary | ICD-10-CM | POA: Diagnosis not present

## 2022-11-26 DIAGNOSIS — G4733 Obstructive sleep apnea (adult) (pediatric): Secondary | ICD-10-CM | POA: Diagnosis not present

## 2022-11-26 DIAGNOSIS — F5101 Primary insomnia: Secondary | ICD-10-CM | POA: Diagnosis not present

## 2022-11-26 DIAGNOSIS — F4310 Post-traumatic stress disorder, unspecified: Secondary | ICD-10-CM

## 2022-11-26 DIAGNOSIS — F339 Major depressive disorder, recurrent, unspecified: Secondary | ICD-10-CM | POA: Diagnosis not present

## 2022-11-26 MED ORDER — AUVELITY 45-105 MG PO TBCR
EXTENDED_RELEASE_TABLET | ORAL | 0 refills | Status: DC
Start: 2022-11-26 — End: 2023-01-11

## 2022-11-26 NOTE — Progress Notes (Signed)
Sharon Logan [id number] [birth year] 67 y.o.  Subjective:   Patient ID:  Sharon Logan is a 67 y.o. (DOB 12/20/1955) female.  Chief Complaint: No chief complaint on file.   HPI Sharon Logan presents to the office today for follow-up of depression, anxiety, and insomnia.  She reports that she has not been wanting to get out of bed. Low energy and motivation. "It's a struggle to walk the dog" and usually enjoys this. She reports that her mood has been lower "at times." She reports irritability "is fine." Sleep has been "lousy, even with a cPap." She reports that chronic sinusitis seems to interfere with sleep quality. Appetite has been decreased and food has not been tasting good. Noticed her appetite was lower before leaving for trip. She has been reading and noticing she is not comprehending what she read. She has been forgetting to bring necessary items with her at times. Anxiety has been controlled. Denies SI.   She recently went to [place]. Reports that she enjoyed her trip. She returned home about a week ago.   Will be babysitting 69 yo twin girls for 3 days.   Past Medication Trials: Cymbalta- Started after back surgery. Has made her tired, even when reduced to 30 mg. Was having to take 2 hour naps. May have helped slightly with pain. Had HA, Constipation, Nausea Savella- muscle aches, rhabdomyolysis Effexor-HA, helped with excessive sweating.  Pristiq- May have increased depression. Prozac- Constipation. Causes her to feel cold.  Celexa- Worsening depression at 20 mg dose Lexapro- "My anti-depressant of choice" and gives her energy. Was taking 2-3 years prior to surgery. Re-started one week ago. Has taken 10 mg and 20 mg doses in the past. Thinks it may have helped with pain in the past. Zoloft- worsening mood Paxil Viibryd- Felt depression was more "heavy." Wellbutrin- Did not cause HA's. Tolerated well and was effective for depression. Amitriptyline- Did not cause HA's. Weight  gain. Effective.  Nortriptyline- Did not cause HA's. Effective. Lamictal- Rash Gabapentin -Rhabomyolysis Lyrica- Adverse reaction Buspar- Helpful for anxiety in the past. Well-tolerated. Tried again and had severe anxiety.  Trazodone- helped with sleep initiation but sleep was not restful. Severe dry mouth. Ambien- Parasomnia Lunesta- Tolerated, effective at 2 mg dose Remeron- Dry eyes, internal restlessness. Stomach discomfort.  Clonidine Topamax- ineffective for headaches. Caused cognitive side effects.  Hydroxyzine- Initially helped with anxiety and sleep initiation, then no longer as helpful  PHQ2-9    Flowsheet Row Nutrition from 10/26/2022 in [facility] Visit from 09/28/2022 in [facility] Office Visit from 07/27/2022 in [facility] Physical Medicine & Rehabilitation Office Visit from 05/25/2022 in [facility] Office Visit from 10/06/2021 in [facility] Physical Medicine & Rehabilitation  PHQ-2 Total Score 0 0 2 0 0      Flowsheet Row Admission (Discharged) from 07/03/2022 in [place] Pre-Admission Testing 60 from 06/18/2022 in [facility] ED from 10/12/2021 in [facility] Emergency Department at [facility]  C-SSRS RISK CATEGORY No Risk No Risk No Risk        Review of Systems:  Review of Systems  HENT:  Positive for congestion and sinus pressure.   Musculoskeletal:  Negative for gait problem.       Has had spinal cord stimulator for about 2 weeks.  Neurological:        She reports having some nerve pain.  Headaches have been "very mild."  Psychiatric/Behavioral:         Please refer to HPI    Medications: I have reviewed the patient's current medications.  Current Outpatient Medications  Medication Sig Dispense Refill   acetaminophen (TYLENOL) 325 MG tablet Take 650 mg by mouth  every 6 (six) hours as needed for moderate pain.     amLODipine (NORVASC) 5 MG tablet Take 5 mg by mouth daily.     b complex vitamins capsule Take 1 capsule by mouth daily.     baclofen (LIORESAL) 10 MG tablet Take 10 mg by mouth 3 (three) times daily. Reports taking 2-3 times daily     BIOTIN PO Take by mouth.     cetirizine (ZYRTEC) 10 MG tablet Take 10 mg by mouth daily as needed for allergies.     Cholecalciferol (VITAMIN D3) 25 MCG (1000 UT) CAPS Take by mouth.     Dextromethorphan-buPROPion ER (AUVELITY) 45-105 MG TBCR Take 1 tablet daily for 3 days, then increase to 1 tablet twice daily 60 tablet 0   diclofenac Sodium (VOLTAREN) 1 % GEL Apply 2 g topically daily as needed (pain).     DULoxetine (CYMBALTA) 30 MG capsule Take 1 capsule (30 mg total) by mouth daily. 30 capsule 1   fluticasone (FLONASE) 50 MCG/ACT nasal spray Place 2 sprays into both nostrils 2 (two) times daily as needed for allergies or rhinitis.     lidocaine (LIDODERM) 5 % Place 1 patch onto the skin daily.     magnesium 30 MG tablet Take 30 mg by mouth at bedtime.     methotrexate (RHEUMATREX) 2.5 MG tablet Take by mouth.     mirtazapine (REMERON) 15 MG tablet Take 1/2-1 tab po QHS 30 tablet 0   rizatriptan (MAXALT) 10 MG tablet Take 10 mg by mouth daily as needed for migraine.      saccharomyces boulardii (FLORASTOR) 250 MG capsule Take 250 mg by mouth daily.     XTAMPZA ER 13.5 MG C12A Take 13.5 mg by mouth every 12 (twelve) hours.     Carboxymethylcellulose Sodium (THERATEARS OP) Place 1 drop into both eyes 4 (four) times daily as needed (dry eyes). (Patient not taking: Reported on 10/27/2022)     EPINEPHrine 0.3 mg/0.3 mL IJ SOAJ injection Inject 0.3 mg into the muscle as needed for anaphylaxis.     sulindac (CLINORIL) 150 MG tablet Take 150 mg by mouth 2 (two) times daily. (Patient not taking: Reported on 10/26/2022)     No current facility-administered medications for this visit.    Medication Side Effects:  None  Allergies:  Allergies  Allergen Reactions   Gabapentin Other (See Comments)    Delirium, stroke like symptoms  Other Reaction(s): Myalgia, Other  Has stroke symptoms when taking this with percocet.     Other reaction(s): Delirium    Delirium, stroke like symptoms   Amitriptyline Other (See Comments)   Amlodipine Other (See Comments)    headaches   Polymyxin B Other (See Comments)    Eyes go blood red    Pregabalin Itching and Rash    Itchy red rash on chest  Other Reaction(s): other   Zolpidem Rash    "sleep walk"    Cymbalta [Duloxetine Hcl] Other (See Comments)    Headaches, constipation   Fluoxetine Other (See Comments)    CONSTIPATION    Lamotrigine Rash   Other Other (See Comments)    OTOBIOTIC > RED EYES    Past Medical  History:  Diagnosis Date   Allergic rhinitis    Anxiety    Arthritis    Cancer (HCC)    skin   Chronic pain    Chronically dry eyes    Depression    Fibromyalgia    GERD (gastroesophageal reflux disease)    Headache    Osteoporosis    Sleep apnea    Stroke (HCC)    from gabapenting per pt no deficits   Vitamin D deficiency    Wears glasses     Past Medical History, Surgical history, Social history, and Family history were reviewed and updated as appropriate.   Please see review of systems for further details on the patient's review from today.   Objective:   Physical Exam:  There were no vitals taken for this visit.  Physical Exam  Lab Review:     Component Value Date/Time   NA 140 07/04/2022 0407   NA 142 06/03/2021 1623   K 3.9 07/04/2022 0407   CL 109 07/04/2022 0407   CO2 26 07/04/2022 0407   GLUCOSE 95 07/04/2022 0407   BUN 15 07/04/2022 0407   BUN 21 06/03/2021 1623   CREATININE 0.73 07/04/2022 0407   CALCIUM 8.1 (L) 07/04/2022 0407   PROT 7.4 04/21/2021 0910   ALBUMIN 3.8 04/21/2021 0910   AST 26 04/21/2021 0910   ALT 21 04/21/2021 0910   ALKPHOS 120 04/21/2021 0910   BILITOT 0.7 04/21/2021 0910    GFRNONAA >60 07/04/2022 0407   GFRAA >60 06/26/2019 1008       Component Value Date/Time   WBC 7.0 06/18/2022 1036   RBC 4.71 06/18/2022 1036   HGB 10.8 (L) 07/04/2022 0407   HGB 12.1 06/03/2021 1622   HCT 32.8 (L) 07/04/2022 0407   HCT 37.6 06/03/2021 1622   PLT 294 06/18/2022 1036   PLT 336 06/03/2021 1622   MCV 94.5 06/18/2022 1036   MCV 92 06/03/2021 1622   MCH 29.9 06/18/2022 1036   MCHC 31.7 06/18/2022 1036   RDW 12.8 06/18/2022 1036   RDW 12.8 06/03/2021 1622   LYMPHSABS 2.0 06/03/2021 1622   MONOABS 0.5 04/21/2021 0910   EOSABS 0.1 06/03/2021 1622   BASOSABS 0.0 06/03/2021 1622    No results found for: "POCLITH", "LITHIUM"   No results found for: "PHENYTOIN", "PHENOBARB", "VALPROATE", "CBMZ"   .res Assessment: Plan:    Discussed potential benefits, risks, and side effects of Auvelity. Will start Auvelity 45-105 mg one tablet daily for 3 days, then increase Auvelity to one tablet twice daily for depression.  Advised pt to stop Wellbutrin XL since Auvelity contains Bupropion.  Will continue Cymbalta 30 mg daily for depression and anxiety. She reports that she prefers not to increase dose at this time.  Will continue Remeron 15 mg 1/2-1 tab po QHS for insomnia.  Pt to follow-up with this provider in 4 weeks or sooner if clinically indicated.  Patient advised to contact office with any questions, adverse effects, or acute worsening in signs and symptoms. I spent 25 minutes dedicated to the care of this patient on the date of this  encounter to include pre-visit review of records, face-to-face time with the patient discussing treatment options, ordering of medication, and post visit documentation.   Diagnoses and all orders for this visit:  Major depression, recurrent, chronic (HCC) -     Dextromethorphan-buPROPion ER (AUVELITY) 45-105 MG TBCR; Take 1 tablet daily for 3 days, then increase to 1 tablet twice daily  PTSD (post-traumatic stress  disorder)  Insomnia,  unspecified type     Please see After Visit Summary for patient specific instructions.  Future Appointments  Date Time Provider Department Center  11/27/2022 11:40 AM Raulkar, Drema Pry, MD [facility] CPR  12/01/2022  2:45 PM Lavinia Sharps C, PT [facility] None  12/03/2022  3:30 PM Lavinia Sharps C, PT [facility] None  12/15/2022 11:00 AM Lavinia Sharps C, PT [facility] None  12/22/2022  9:30 AM Edrick Oh, PT [facility] None  12/24/2022 11:00 AM Edrick Oh, PT [facility] None  12/28/2022 12:30 PM Edrick Oh, PT [facility] None  12/29/2022 10:30 AM Corie Chiquito, PMHNP [facility] None  12/30/2022 11:45 AM Edrick Oh, PT [facility] None  01/18/2023 11:00 AM Bonnita Levan, RD [facility] NDM  01/25/2023 10:20 AM Carlis Abbott Drema Pry, MD [facility] CPR  10/04/2023 10:30 AM Drema Dallas, DO [facility] None    No orders of the defined types were placed in this encounter.   -------------------------------

## 2022-11-27 ENCOUNTER — Encounter: Payer: Medicare HMO | Admitting: Physical Medicine and Rehabilitation

## 2022-11-27 DIAGNOSIS — J0101 Acute recurrent maxillary sinusitis: Secondary | ICD-10-CM | POA: Diagnosis not present

## 2022-11-27 DIAGNOSIS — Z6823 Body mass index (BMI) 23.0-23.9, adult: Secondary | ICD-10-CM | POA: Diagnosis not present

## 2022-12-01 ENCOUNTER — Ambulatory Visit: Payer: Medicare HMO | Admitting: Physical Therapy

## 2022-12-01 DIAGNOSIS — M810 Age-related osteoporosis without current pathological fracture: Secondary | ICD-10-CM

## 2022-12-01 DIAGNOSIS — M5459 Other low back pain: Secondary | ICD-10-CM

## 2022-12-01 DIAGNOSIS — M6281 Muscle weakness (generalized): Secondary | ICD-10-CM | POA: Diagnosis not present

## 2022-12-01 NOTE — Therapy (Signed)
OUTPATIENT PHYSICAL THERAPY THORACOLUMBAR TREATMENT   Patient Name: Sharon Logan MRN: [id number] DOB:[birth year], 67 y.o., female Today's Date: 11/03/2022  END OF SESSION:  PT End of Session - 12/01/22 1440     Visit Number 4    Number of Visits 16    Date for PT Re-Evaluation 12/29/22    Authorization Type [employer] 16 visits    PT Start Time 1445    PT Stop Time 1525    PT Time Calculation (min) 40 min    Activity Tolerance Patient tolerated treatment well               Past Medical History:  Diagnosis Date   Allergic rhinitis    Anxiety    Arthritis    Cancer (HCC)    skin   Chronic pain    Chronically dry eyes    Depression    Fibromyalgia    GERD (gastroesophageal reflux disease)    Headache    Osteoporosis    Sleep apnea    Stroke (HCC)    from gabapenting per pt no deficits   Vitamin D deficiency    Wears glasses    Past Surgical History:  Procedure Laterality Date   ABDOMINAL HYSTERECTOMY     BACK SURGERY     BUNIONECTOMY Bilateral    EYE SURGERY     laser surgery   NASAL SINUS SURGERY     x2   REVERSE SHOULDER ARTHROPLASTY Right 07/03/2022   Procedure: REVERSE SHOULDER ARTHROPLASTY;  Surgeon: Beverely Low, MD;  Location: [facility];  Service: Orthopedics;  Laterality: Right;  120 min choice with interscalene block   SACROILIAC JOINT FUSION Bilateral 09/12/2020   Procedure: REMOVAL OF BILATERAL PELVIC SCREWS;  Surgeon: Estill Bamberg, MD;  Location: [facility];  Service: Orthopedics;  Laterality: Bilateral;   SKIN CANCER EXCISION     face x3   TUBAL LIGATION     Patient Active Problem List   Diagnosis Date Noted   S/P shoulder replacement, right 07/03/2022   Allergic contact dermatitis due to other agents 04/13/2022   S/P lumbar microdiscectomy 04/30/2021   S/P hardware removal 09/12/2020   Radiculopathy 06/29/2019   Cervical radiculopathy 08/15/2018   Chronic sinusitis 07/27/2018   Constipation 07/27/2018   Chronic bilateral low back  pain with bilateral sciatica 07/01/2018   Disorder of bone and cartilage 04/04/2018   Esophageal reflux 04/04/2018   Other and unspecified hyperlipidemia 04/04/2018   Other malaise and fatigue 04/04/2018   Pain in joint involving ankle and foot 04/04/2018   Leg pain 04/04/2018   Posttraumatic stress disorder 04/04/2018   Raynaud's phenomenon 04/04/2018   Skin sensation disturbance 04/04/2018   Sleep disturbance 04/04/2018   Vitamin D deficiency 04/04/2018   Myoclonus, segmental 10/18/2017   Adjacent segment disease with spinal stenosis 10/04/2017   DDD (degenerative disc disease), lumbar 10/30/2016   Spondylolisthesis of lumbar region 08/21/2016   Overweight with body mass index (BMI) 25.0-29.9 07/22/2016   Capsulitis of metatarsophalangeal (MTP) joint of right foot 03/23/2016   Lumbar spinal stenosis 12/05/2015   Migraine 09/13/2012   Other allergic rhinitis 09/13/2012   Neuropathy 09/13/2012    PCP: Moshe Cipro NP  REFERRING PROVIDER: Moshe Cipro NP  REFERRING DIAG:  [phone] (ICD-10-CM) - Spinal stenosis, lumbar region without neurogenic claudication  M79.7 (ICD-10-CM) - Fibromyalgia  G89.4 (ICD-10-CM) - Chronic pain syndrome  M81.0 (ICD-10-CM) - Age-related osteoporosis without current pathological fracture    Rationale for Evaluation and Treatment: Rehabilitation  THERAPY DIAG:  Back pain, weakness, osteoporosis  ONSET DATE: 06/15/2022 (exacerbation of chronic problem)  SUBJECTIVE:                                                                                                                                                                                           SUBJECTIVE STATEMENT:  Using an app to adjust spinal cord stimulator.  I think PT is going well.  No increase in symptoms following sessions.    PERTINENT HISTORY:   spinal cord stimulator next week (May) no twisting, no bending until end of July but sees MD early July; no aquatic PT for 4  months  Reverse TSR. Surgery on 07-03-22   L3-4 andL5-S1 PLIF 2023; fibromyalgia, chronic pain,  OA,  osteoporosis (unsure what areas are impacted) Depression Osteoporosis: T score: -2.9 proximal femur, forearm -1.6 PAIN:  PAIN:  Are you having pain? no NPRS scale: 0/10 Pain location: both legs and arms  Aggravating factors: stood too long, walk extra, extra housecleaning (energy depleting) Relieving factors: putting feet up with sitting or lying down for 20 min  PRECAUTIONS: None  WEIGHT BEARING RESTRICTIONS: No  FALLS:  Has patient fallen in last 6 months? No my balance could be better though  LIVING ENVIRONMENT: Lives in: House/apartment Stairs: 1 step to enter   OCCUPATION: retired Enjoys walking, reading, listen to music, tend to plants on deck with hose Has a black lab  PLOF: Independent  PATIENT GOALS: 4 day bus trip in October; I'd like to walk in the [place] and feel mobile   NEXT MD VISIT: every 6 weeks  OBJECTIVE:   DIAGNOSTIC FINDINGS:  Pt will look for Dexa scan results in her records   PATIENT SURVEYS:  Modified Oswestry 30% moderate self perceived disability   COGNITION: Overall cognitive status: Within functional limits for tasks assessed      MUSCLE LENGTH: Dec bil hip flexor lengths: 8 degrees bil of hip extension  POSTURE: decreased lumbar lordosis and increased thoracic kyphosis   LUMBAR ROM:  Uses a squat method to reach floor with ease; unable to lie prone  AROM eval  Flexion WFLs  Extension 5 degrees  Right lateral flexion   Left lateral flexion   Right rotation   Left rotation    (Blank rows = not tested)  TRUNK STRENGTH:  Decreased activation of transverse abdominus muscles; abdominals 4-/5; decreased activation of lumbar multifidi; trunk extensors 4-/5 6/18:  abdominals and trunk extensors grossly 4/5  LOWER EXTREMITY ROM:   WFLs  LOWER EXTREMITY MMT:  SLS left 3 sec; right SLS 1 sec with pelvic drop;  quadruped  bird dog pelvic  drop and compensatory rotation right/left Frequent HS cramps during movement testing  MMT Right eval Left eval 6/18  Hip flexion 4 4 4+  Hip extension 4- 4- 4  Hip abduction 3+ 3+ 4-  Hip adduction     Hip internal rotation     Hip external rotation 4- 4- 4  Knee flexion 4 4 4   Knee extension 4 4 4   Ankle dorsiflexion     Ankle plantarflexion     Ankle inversion     Ankle eversion      (Blank rows = not tested)   FUNCTIONAL TESTS:  Able to rise from standard height chair without UE assist  GAIT: 11/12/22: : 1,303 feet Comments: no assistive device needed  TODAY'S TREATMENT:                                                                                                                              DATE:  12/01/22: NuStep L1 x 10' PT present to discuss plan for session Sit to stand  5# kettle bell 5x; press 5# weight overhead 5x Chair sit ups 5# kettlebell 5x; press 5# overhead 5x railing push ups:    20      reps UE pull: lat bar pulls 30#:  2 sets of 10 Lunge backward as if going to kneel on green pod 2 sets of 5;  Green band (too easy) resisted walk anchored in door: backwards, forward, side stepping 5x each; tried with black band and given one for home with a handle 3 reps each direction Heel drops (emphasizing a loud drop for bone building) 10x Stomping feet with a clap for bone building 10x       11/25/22: NuStep L5 x 6' PT present to discuss plan for session Counter heel/toe raise 2x10-standing on balance pad Hip abduction and extension 2x10 bil- standing on balance pad Sidestepping with yellow loop at ankles 2 passes down and back along counter (gave tied green band for HEP) Standing green bil row and shoulder extension Sit to stand with hip hinge and focus on eccentric landing control 5# kettle bell 2x10 Add bil 3lb UE weights to sit to stand with chest press x 5 Seated hamstring stretch: 3x20 seconds Seated figure 3x20 seconds   Leg  press 60lb bil LE x20 seat 6 recline 3 11/12/22: NuStep L5 x 4' PT present to discuss plan for session Pt education provided throughout session for benefits of loading and WB for bone health, slowing down reps to fully engage target muscle, reason for use of hip hinge to keep back straight with seated HS stretch and sit to stand Counter heel toe raise x10 Counter alt Lt/Rt hip abd and ext x 10 each LE Sidestepping with yellow loop at ankles 2 passes down and back along counter (gave tied green band for HEP) Standing green bil row and shoulder extension 1,303 feet Sit to stand with hip hinge and focus on eccentric landing  control x 10 Add bil 3lb UE weights to sit to stand with chest press x 5 Bird dog - opp arm/leg very challenging so doing arm only, leg only for now x 5 rounds Leg press 60lb bil LE x20 seat 6 recline 3 HEP updated and issued  11/03/22  Initial HEP   PATIENT EDUCATION:  Education details: Educated patient on anatomy and physiology of current symptoms, prognosis, plan of care as well as initial self care strategies to promote recovery  Person educated: Patient Education method: Explanation Education comprehension: verbalized understanding  HOME EXERCISE PROGRAM: [email or web address] Access Code: [email or web address] URL: [email or web address] Date: 12/01/2022 Prepared by: Lavinia Sharps  Exercises - Clamshell  - 1 x daily - 7 x weekly - 1 sets - 10 reps - Sidelying Hip Abduction  - 1 x daily - 7 x weekly - 1 sets - 10 reps - Hip Flexor Stretch at Edge of Bed  - 1 x daily - 7 x weekly - 1 sets - 2-3 reps - 30 hold - Heel Toe Raises with Counter Support  - 1 x daily - 7 x weekly - 3 sets - 10 reps - Standing Hip Abduction with Counter Support  - 1 x daily - 7 x weekly - 3 sets - 10 reps - Standing Hip Extension with Counter Support  - 1 x daily - 7 x weekly - 3 sets - 10 reps - Side Stepping with Resistance at Ankles and Counter Support  - 1 x daily - 7 x weekly - 3 sets  - 10 reps - Standing Row with Anchored Resistance  - 1 x daily - 7 x weekly - 3 sets - 10 reps - Standing Shoulder Extension with Resistance  - 1 x daily - 7 x weekly - 3 sets - 10 reps - Sit to Stand Without Arm Support  - 1 x daily - 7 x weekly - 2 sets - 10 reps - Bird Dog  - 1 x daily - 7 x weekly - 1 sets - 5 reps - Seated Hamstring Stretch  - 1 x daily - 7 x weekly - 1 sets - 2-3 reps - 20-30 sec hold - RESISTED BACKWARD WALK  - 1 x daily - 7 x weekly - 1 sets - 1 reps - Standing Heel Raise with Support  - 1 x daily - 7 x weekly - 1 sets - 10 reps - Standing March with Counter Support  - 1 x daily - 7 x weekly - 1 sets - 10 reps  ASSESSMENT:  CLINICAL IMPRESSION: Able to steadily progress strengthening ex's and added further bone building ex's to HEP.  Femoral area specifically targeted since it has the lowest score per DEXA scan.  Therapist monitoring response to all interventions and modifying treatment accordingly. No pain prior to, during or after treatment session.  On track with meeting STGs.   OBJECTIVE IMPAIRMENTS: decreased activity tolerance, decreased strength, impaired perceived functional ability, and pain.   ACTIVITY LIMITATIONS: carrying, lifting, bending, standing, and locomotion level  PARTICIPATION LIMITATIONS: cleaning, community activity, and yard work  PERSONAL FACTORS: 3+ comorbidities: multi regions affected, fibromyalgia, time since onset , osteoporosis are also affecting patient's functional outcome.   REHAB POTENTIAL: Good  CLINICAL DECISION MAKING: Evolving/moderate complexity  EVALUATION COMPLEXITY: Moderate   GOALS: Goals reviewed with patient? Yes  SHORT TERM GOALS: Target date: 12/01/2022    The patient will demonstrate knowledge of basic self care strategies, osteoporosis education and exercises to promote health Baseline: Goal status:  met 6/18 2.  The patient will have improved bil hip strength to at least 4/5 needed for standing, walking  longer distances and descending stairs at home and in the community  Baseline:  Goal status: met 6/18  3.  The patient will have improved trunk flexor and extensor muscle strength to at least 4/5 needed for lifting medium weight objects such as grocery bags Baseline:  Goal status: met 6/18 4.  Patient will demo hip hinge and squat needed for watering her plants  Baseline:  Goal status: ongoing    LONG TERM GOALS: Target date: 12/29/2022    The patient will be independent in a safe self progression of a home exercise program to promote further recovery of function  Baseline:  Goal status: INITIAL  2.  The patient will report a 50% improvement in pain levels with functional activities which are currently difficult including standing and walking for longer periods of time and housework Baseline:  no pain reported today, this varies (11/25/22) Goal status: IN PROGRESS  3.  The patient will have improved trunk flexor and extensor muscle strength to at least 4+/5 needed for lifting medium weight objects such as  luggage for bus trip to [place] Baseline:  Goal status: INITIAL  4.  The patient will have improved gait stamina and speed needed to ambulate 800 feet in 6 minutes  Baseline: 1303 (11/12/22) Goal status: MET  5.  Modified Oswestry improved to 24% indicating improved function with less pain Baseline:  Goal status: INITIAL    PLAN:  PT FREQUENCY: 2x/week  PT DURATION: 8 weeks  PLANNED INTERVENTIONS: Therapeutic exercises, Therapeutic activity, Neuromuscular re-education, Balance training, Gait training, Patient/Family education, Self Care, Joint mobilization, Aquatic Therapy, Dry Needling, Moist heat, Taping, Manual therapy, and Re-evaluation.  PLAN FOR NEXT SESSION: continue with weight training and added balance challenge; check hip hinge/squat for STG #4  Lavinia Sharps, PT 12/01/22 4:53 PM Phone: [phone] Fax: [phone]  Phone: [phone] Fax:  [phone]

## 2022-12-03 ENCOUNTER — Ambulatory Visit: Payer: Medicare HMO | Admitting: Physical Therapy

## 2022-12-03 DIAGNOSIS — M6281 Muscle weakness (generalized): Secondary | ICD-10-CM

## 2022-12-03 DIAGNOSIS — M5459 Other low back pain: Secondary | ICD-10-CM

## 2022-12-03 DIAGNOSIS — M810 Age-related osteoporosis without current pathological fracture: Secondary | ICD-10-CM

## 2022-12-03 NOTE — Therapy (Signed)
OUTPATIENT PHYSICAL THERAPY THORACOLUMBAR TREATMENT   Patient Name: Sharon Logan MRN: [id number] DOB:08-28-1955, 67 y.o., female Today's Date: 11/03/2022  END OF SESSION:  PT End of Session - 12/03/22 1528     Visit Number 5    Number of Visits 16    Date for PT Re-Evaluation 12/29/22    Authorization Type [employer] 16 visits    PT Start Time 1530    PT Stop Time 1610    PT Time Calculation (min) 40 min    Activity Tolerance Patient tolerated treatment well               Past Medical History:  Diagnosis Date   Allergic rhinitis    Anxiety    Arthritis    Cancer (HCC)    skin   Chronic pain    Chronically dry eyes    Depression    Fibromyalgia    GERD (gastroesophageal reflux disease)    Headache    Osteoporosis    Sleep apnea    Stroke (HCC)    from gabapenting per pt no deficits   Vitamin D deficiency    Wears glasses    Past Surgical History:  Procedure Laterality Date   ABDOMINAL HYSTERECTOMY     BACK SURGERY     BUNIONECTOMY Bilateral    EYE SURGERY     laser surgery   NASAL SINUS SURGERY     x2   REVERSE SHOULDER ARTHROPLASTY Right 07/03/2022   Procedure: REVERSE SHOULDER ARTHROPLASTY;  Surgeon: Beverely Low, MD;  Location: [facility];  Service: Orthopedics;  Laterality: Right;  120 min choice with interscalene block   SACROILIAC JOINT FUSION Bilateral 09/12/2020   Procedure: REMOVAL OF BILATERAL PELVIC SCREWS;  Surgeon: Estill Bamberg, MD;  Location: [facility];  Service: Orthopedics;  Laterality: Bilateral;   SKIN CANCER EXCISION     face x3   TUBAL LIGATION     Patient Active Problem List   Diagnosis Date Noted   S/P shoulder replacement, right 07/03/2022   Allergic contact dermatitis due to other agents 04/13/2022   S/P lumbar microdiscectomy 04/30/2021   S/P hardware removal 09/12/2020   Radiculopathy 06/29/2019   Cervical radiculopathy 08/15/2018   Chronic sinusitis 07/27/2018   Constipation 07/27/2018   Chronic bilateral low back  pain with bilateral sciatica 07/01/2018   Disorder of bone and cartilage 04/04/2018   Esophageal reflux 04/04/2018   Other and unspecified hyperlipidemia 04/04/2018   Other malaise and fatigue 04/04/2018   Pain in joint involving ankle and foot 04/04/2018   Leg pain 04/04/2018   Posttraumatic stress disorder 04/04/2018   Raynaud's phenomenon 04/04/2018   Skin sensation disturbance 04/04/2018   Sleep disturbance 04/04/2018   Vitamin D deficiency 04/04/2018   Myoclonus, segmental 10/18/2017   Adjacent segment disease with spinal stenosis 10/04/2017   DDD (degenerative disc disease), lumbar 10/30/2016   Spondylolisthesis of lumbar region 08/21/2016   Overweight with body mass index (BMI) 25.0-29.9 07/22/2016   Capsulitis of metatarsophalangeal (MTP) joint of right foot 03/23/2016   Lumbar spinal stenosis 12/05/2015   Migraine 09/13/2012   Other allergic rhinitis 09/13/2012   Neuropathy 09/13/2012    PCP: Moshe Cipro NP  REFERRING PROVIDER: Moshe Cipro NP  REFERRING DIAG:  [phone] (ICD-10-CM) - Spinal stenosis, lumbar region without neurogenic claudication  M79.7 (ICD-10-CM) - Fibromyalgia  G89.4 (ICD-10-CM) - Chronic pain syndrome  M81.0 (ICD-10-CM) - Age-related osteoporosis without current pathological fracture    Rationale for Evaluation and Treatment: Rehabilitation  THERAPY DIAG:  Back pain, weakness, osteoporosis  ONSET DATE: 06/15/2022 (exacerbation of chronic problem)  SUBJECTIVE:                                                                                                                                                                                           SUBJECTIVE STATEMENT:  I did fine after last time and did some Tai Chi after.  I think the spinal cord stimulator has been helping.     PERTINENT HISTORY:   spinal cord stimulator next week (May) no twisting, no bending until end of July but sees MD early July; no aquatic PT for 4 months I  had an ablation several months ago and I'll never do it again  Reverse TSR. Surgery on 07-03-22   L3-4 andL5-S1 PLIF 2023; fibromyalgia, chronic pain,  OA,  osteoporosis (unsure what areas are impacted) Depression Osteoporosis: T score: -2.9 proximal femur, forearm -1.6 PAIN:  PAIN:  Are you having pain? no NPRS scale: 0/10 Pain location: lower neck and shoulders Aggravating factors: stood too long, walk extra, extra housecleaning (energy depleting) Relieving factors: putting feet up with sitting or lying down for 20 min  PRECAUTIONS: None  WEIGHT BEARING RESTRICTIONS: No  FALLS:  Has patient fallen in last 6 months? No my balance could be better though  LIVING ENVIRONMENT: Lives in: House/apartment Stairs: 1 step to enter   OCCUPATION: retired Enjoys walking, reading, listen to music, tend to plants on deck with hose Has a black lab  PLOF: Independent  PATIENT GOALS: 4 day bus trip in October; I'd like to walk in the [place] and feel mobile   NEXT MD VISIT: every 6 weeks  OBJECTIVE:   DIAGNOSTIC FINDINGS:  Pt will look for Dexa scan results in her records   PATIENT SURVEYS:  Modified Oswestry 30% moderate self perceived disability   COGNITION: Overall cognitive status: Within functional limits for tasks assessed      MUSCLE LENGTH: Dec bil hip flexor lengths: 8 degrees bil of hip extension  POSTURE: decreased lumbar lordosis and increased thoracic kyphosis   LUMBAR ROM:  Uses a squat method to reach floor with ease; unable to lie prone  AROM eval  Flexion WFLs  Extension 5 degrees  Right lateral flexion   Left lateral flexion   Right rotation   Left rotation    (Blank rows = not tested)  TRUNK STRENGTH:  Decreased activation of transverse abdominus muscles; abdominals 4-/5; decreased activation of lumbar multifidi; trunk extensors 4-/5 6/18:  abdominals and trunk extensors grossly 4/5  LOWER EXTREMITY ROM:   WFLs  LOWER EXTREMITY MMT:  SLS  left 3  sec; right SLS 1 sec with pelvic drop;  quadruped bird dog pelvic drop and compensatory rotation right/left Frequent HS cramps during movement testing  MMT Right eval Left eval 6/18  Hip flexion 4 4 4+  Hip extension 4- 4- 4  Hip abduction 3+ 3+ 4-  Hip adduction     Hip internal rotation     Hip external rotation 4- 4- 4  Knee flexion 4 4 4   Knee extension 4 4 4   Ankle dorsiflexion     Ankle plantarflexion     Ankle inversion     Ankle eversion      (Blank rows = not tested)   FUNCTIONAL TESTS:  Able to rise from standard height chair without UE assist  GAIT: 11/12/22: : 1,303 feet Comments: no assistive device needed  TODAY'S TREATMENT:                                                                                                                              DATE:  12/03/22: NuStep new L5 x 10' PT present to discuss status Hip hinge assessment as if watering plants 5# kettlebell 10x cues for hips back rather than squat 6 inch step ups 5x right/left  Leg press 70lb bil LE x20: 35# single leg 20x right/left seat 6 recline 3 Cable side step 5# 5x right/left UE pull: lat bar pulls 30#:  2 sets of 10 Heel drops (emphasizing a loud drop for bone building) 10x Stomping feet  for bone building 10x Neuromuscular re-education: muscle activation with verbal and tactile cues to improve stability and reduce loss of balance ;  toe tap on circles random, cognitive dual task close supervision but no loss of balance    12/01/22: NuStep L1 x 10' PT present to discuss plan for session Sit to stand  5# kettle bell 5x; press 5# weight overhead 5x Chair sit ups 5# kettlebell 5x; press 5# overhead 5x railing push ups:    20      reps UE pull: lat bar pulls 30#:  2 sets of 10 Lunge backward as if going to kneel on green pod 2 sets of 5;  Green band (too easy) resisted walk anchored in door: backwards, forward, side stepping 5x each; tried with black band and given one for home  with a handle 3 reps each direction Heel drops (emphasizing a loud drop for bone building) 10x Stomping feet  for bone building 10x       11/25/22: NuStep L5 x 6' PT present to discuss plan for session Counter heel/toe raise 2x10-standing on balance pad Hip abduction and extension 2x10 bil- standing on balance pad Sidestepping with yellow loop at ankles 2 passes down and back along counter (gave tied green band for HEP) Standing green bil row and shoulder extension Sit to stand with hip hinge and focus on eccentric landing control 5# kettle bell 2x10 Add bil 3lb UE weights to sit to stand with chest  press x 5 Seated hamstring stretch: 3x20 seconds Seated figure 3x20 seconds   Leg press 60lb bil LE x20 seat 6 recline 3   PATIENT EDUCATION:  Education details: Educated patient on anatomy and physiology of current symptoms, prognosis, plan of care as well as initial self care strategies to promote recovery  Person educated: Patient Education method: Explanation Education comprehension: verbalized understanding  HOME EXERCISE PROGRAM: [email or web address] Access Code: [email or web address] URL: [email or web address] Date: 12/01/2022 Prepared by: Lavinia Sharps  Exercises - Clamshell  - 1 x daily - 7 x weekly - 1 sets - 10 reps - Sidelying Hip Abduction  - 1 x daily - 7 x weekly - 1 sets - 10 reps - Hip Flexor Stretch at Edge of Bed  - 1 x daily - 7 x weekly - 1 sets - 2-3 reps - 30 hold - Heel Toe Raises with Counter Support  - 1 x daily - 7 x weekly - 3 sets - 10 reps - Standing Hip Abduction with Counter Support  - 1 x daily - 7 x weekly - 3 sets - 10 reps - Standing Hip Extension with Counter Support  - 1 x daily - 7 x weekly - 3 sets - 10 reps - Side Stepping with Resistance at Ankles and Counter Support  - 1 x daily - 7 x weekly - 3 sets - 10 reps - Standing Row with Anchored Resistance  - 1 x daily - 7 x weekly - 3 sets - 10 reps - Standing Shoulder Extension with Resistance  - 1 x  daily - 7 x weekly - 3 sets - 10 reps - Sit to Stand Without Arm Support  - 1 x daily - 7 x weekly - 2 sets - 10 reps - Bird Dog  - 1 x daily - 7 x weekly - 1 sets - 5 reps - Seated Hamstring Stretch  - 1 x daily - 7 x weekly - 1 sets - 2-3 reps - 20-30 sec hold - RESISTED BACKWARD WALK  - 1 x daily - 7 x weekly - 1 sets - 1 reps - Standing Heel Raise with Support  - 1 x daily - 7 x weekly - 1 sets - 10 reps - Standing March with Counter Support  - 1 x daily - 7 x weekly - 1 sets - 10 reps  ASSESSMENT:  CLINICAL IMPRESSION: Verbal cues and demo for improved hip hinge for watering plants vs stiff squat.  Emphasis on strengthening and loading of Les for bone health targeting femurs.  Close supervision with gait belt on with resisted side stepping and balance challenges for safety but no physical assist needed.  Therapist monitoring response to all interventions and modifying treatment accordingly.    OBJECTIVE IMPAIRMENTS: decreased activity tolerance, decreased strength, impaired perceived functional ability, and pain.   ACTIVITY LIMITATIONS: carrying, lifting, bending, standing, and locomotion level  PARTICIPATION LIMITATIONS: cleaning, community activity, and yard work  PERSONAL FACTORS: 3+ comorbidities: multi regions affected, fibromyalgia, time since onset , osteoporosis are also affecting patient's functional outcome.   REHAB POTENTIAL: Good  CLINICAL DECISION MAKING: Evolving/moderate complexity  EVALUATION COMPLEXITY: Moderate   GOALS: Goals reviewed with patient? Yes  SHORT TERM GOALS: Target date: 12/01/2022    The patient will demonstrate knowledge of basic self care strategies, osteoporosis education and exercises to promote health Baseline: Goal status: met 6/18 2.  The patient will have improved bil hip strength to at least 4/5 needed for standing, walking longer distances and descending  stairs at home and in the community  Baseline:  Goal status: met 6/18  3.   The patient will have improved trunk flexor and extensor muscle strength to at least 4/5 needed for lifting medium weight objects such as grocery bags Baseline:  Goal status: met 6/18 4.  Patient will demo hip hinge and squat needed for watering her plants  Baseline:  Goal status: met 6/20    LONG TERM GOALS: Target date: 12/29/2022    The patient will be independent in a safe self progression of a home exercise program to promote further recovery of function  Baseline:  Goal status: INITIAL  2.  The patient will report a 50% improvement in pain levels with functional activities which are currently difficult including standing and walking for longer periods of time and housework Baseline:  no pain reported today, this varies (11/25/22) Goal status: IN PROGRESS  3.  The patient will have improved trunk flexor and extensor muscle strength to at least 4+/5 needed for lifting medium weight objects such as  luggage for bus trip to the [place] Baseline:  Goal status: INITIAL  4.  The patient will have improved gait stamina and speed needed to ambulate 800 feet in 6 minutes  Baseline: 1303 (11/12/22) Goal status: MET  5.  Modified Oswestry improved to 24% indicating improved function with less pain Baseline:  Goal status: INITIAL    PLAN:  PT FREQUENCY: 2x/week  PT DURATION: 8 weeks  PLANNED INTERVENTIONS: Therapeutic exercises, Therapeutic activity, Neuromuscular re-education, Balance training, Gait training, Patient/Family education, Self Care, Joint mobilization, Aquatic Therapy, Dry Needling, Moist heat, Taping, Manual therapy, and Re-evaluation.  PLAN FOR NEXT SESSION: increase weight on leg press;  continue with weight training and added balance challenge  Lavinia Sharps, PT 12/03/22 5:12 PM Phone: [phone] Fax: [phone]  Phone: [phone] Fax: [phone]

## 2022-12-04 ENCOUNTER — Telehealth: Payer: Self-pay | Admitting: Psychiatry

## 2022-12-04 NOTE — Telephone Encounter (Signed)
Patient notified

## 2022-12-04 NOTE — Telephone Encounter (Signed)
Pt LVM @ 11:50a.  She said the combo of meds she is on is too strong.  It's making her sedated and forgetful about appts.  Next appt 7/16

## 2022-12-04 NOTE — Telephone Encounter (Signed)
On 6/13 patient was started on Auvelity and bupropion discontinued. She did stop the bupropion and is only taking 1 Auvelity currently, taking in the morning. She said she feels drugged up and is forgetful. She did not take Cymbalta today because of this. She says she feels this way all day.

## 2022-12-05 DIAGNOSIS — M542 Cervicalgia: Secondary | ICD-10-CM | POA: Diagnosis not present

## 2022-12-10 ENCOUNTER — Encounter: Payer: No Typology Code available for payment source | Admitting: Physical Therapy

## 2022-12-13 NOTE — Therapy (Signed)
OUTPATIENT PHYSICAL THERAPY THORACOLUMBAR TREATMENT   Patient Name: Sharon Logan MRN: [id number] DOB:Dec 02, 1955, 67 y.o., female Today's Date: 11/03/2022  END OF SESSION:  PT End of Session - 12/15/22 1151     Visit Number 6    Number of Visits 16    Date for PT Re-Evaluation 12/29/22    Authorization Type [employer] 16 visits    PT Start Time 1147    PT Stop Time 1235    PT Time Calculation (min) 48 min    Activity Tolerance Patient tolerated treatment well    Behavior During Therapy WFL for tasks assessed/performed               Past Medical History:  Diagnosis Date   Allergic rhinitis    Anxiety    Arthritis    Cancer (HCC)    skin   Chronic pain    Chronically dry eyes    Depression    Fibromyalgia    GERD (gastroesophageal reflux disease)    Headache    Osteoporosis    Sleep apnea    Stroke (HCC)    from gabapenting per pt no deficits   Vitamin D deficiency    Wears glasses    Past Surgical History:  Procedure Laterality Date   ABDOMINAL HYSTERECTOMY     BACK SURGERY     BUNIONECTOMY Bilateral    EYE SURGERY     laser surgery   NASAL SINUS SURGERY     x2   REVERSE SHOULDER ARTHROPLASTY Right 07/03/2022   Procedure: REVERSE SHOULDER ARTHROPLASTY;  Surgeon: Beverely Low, MD;  Location: [facility];  Service: Orthopedics;  Laterality: Right;  120 min choice with interscalene block   SACROILIAC JOINT FUSION Bilateral 09/12/2020   Procedure: REMOVAL OF BILATERAL PELVIC SCREWS;  Surgeon: Estill Bamberg, MD;  Location: [facility];  Service: Orthopedics;  Laterality: Bilateral;   SKIN CANCER EXCISION     face x3   TUBAL LIGATION     Patient Active Problem List   Diagnosis Date Noted   S/P shoulder replacement, right 07/03/2022   Allergic contact dermatitis due to other agents 04/13/2022   S/P lumbar microdiscectomy 04/30/2021   S/P hardware removal 09/12/2020   Radiculopathy 06/29/2019   Cervical radiculopathy 08/15/2018   Chronic sinusitis 07/27/2018    Constipation 07/27/2018   Chronic bilateral low back pain with bilateral sciatica 07/01/2018   Disorder of bone and cartilage 04/04/2018   Esophageal reflux 04/04/2018   Other and unspecified hyperlipidemia 04/04/2018   Other malaise and fatigue 04/04/2018   Pain in joint involving ankle and foot 04/04/2018   Leg pain 04/04/2018   Posttraumatic stress disorder 04/04/2018   Raynaud's phenomenon 04/04/2018   Skin sensation disturbance 04/04/2018   Sleep disturbance 04/04/2018   Vitamin D deficiency 04/04/2018   Myoclonus, segmental 10/18/2017   Adjacent segment disease with spinal stenosis 10/04/2017   DDD (degenerative disc disease), lumbar 10/30/2016   Spondylolisthesis of lumbar region 08/21/2016   Overweight with body mass index (BMI) 25.0-29.9 07/22/2016   Capsulitis of metatarsophalangeal (MTP) joint of right foot 03/23/2016   Lumbar spinal stenosis 12/05/2015   Migraine 09/13/2012   Other allergic rhinitis 09/13/2012   Neuropathy 09/13/2012    PCP: Moshe Cipro NP  REFERRING PROVIDER: Moshe Cipro NP  REFERRING DIAG:  [id number] (ICD-10-CM) - Spinal stenosis, lumbar region without neurogenic claudication  M79.7 (ICD-10-CM) - Fibromyalgia  G89.4 (ICD-10-CM) - Chronic pain syndrome  M81.0 (ICD-10-CM) - Age-related osteoporosis without current pathological fracture  Rationale for Evaluation and Treatment: Rehabilitation  THERAPY DIAG:  Back pain, weakness, osteoporosis  ONSET DATE: 06/15/2022 (exacerbation of chronic problem)  SUBJECTIVE:                                                                                                                                                                                           SUBJECTIVE STATEMENT:  No pain now but when I woke up this morning and had 5/10 pain in the mid back. Patient presents with new order for mid back pain.    PERTINENT HISTORY:   spinal cord stimulator next week (May) no twisting, no  bending until end of July but sees MD early July; no aquatic PT for 4 months I had an ablation several months ago and I'll never do it again  Reverse TSR. Surgery on 07-03-22   L3-4 andL5-S1 PLIF 2023; fibromyalgia, chronic pain,  OA,  osteoporosis (unsure what areas are impacted) Depression Osteoporosis: T score: -2.9 proximal femur, forearm -1.6 PAIN:  PAIN:  Are you having pain? no NPRS scale: 0/10 Pain location: lower neck and shoulders Aggravating factors: stood too long, walk extra, extra housecleaning (energy depleting) Relieving factors: putting feet up with sitting or lying down for 20 min  PRECAUTIONS: None  WEIGHT BEARING RESTRICTIONS: No  FALLS:  Has patient fallen in last 6 months? No my balance could be better though  LIVING ENVIRONMENT: Lives in: House/apartment Stairs: 1 step to enter   OCCUPATION: retired Enjoys walking, reading, listen to music, tend to plants on deck with hose Has a black lab  PLOF: Independent  PATIENT GOALS: 4 day bus trip in October; I'd like to walk in the [place] and feel mobile   NEXT MD VISIT: every 6 weeks  OBJECTIVE:   DIAGNOSTIC FINDINGS:  Pt will look for Dexa scan results in her records   PATIENT SURVEYS:  Modified Oswestry 30% moderate self perceived disability   COGNITION: Overall cognitive status: Within functional limits for tasks assessed      MUSCLE LENGTH: Dec bil hip flexor lengths: 8 degrees bil of hip extension  POSTURE: decreased lumbar lordosis and increased thoracic kyphosis   LUMBAR ROM:  Uses a squat method to reach floor with ease; unable to lie prone  AROM eval  Flexion WFLs  Extension 5 degrees  Right lateral flexion   Left lateral flexion   Right rotation   Left rotation    (Blank rows = not tested)  TRUNK STRENGTH:  Decreased activation of transverse abdominus muscles; abdominals 4-/5; decreased activation of lumbar multifidi; trunk extensors 4-/5 6/18:  abdominals and trunk  extensors grossly 4/5  LOWER EXTREMITY ROM:   WFLs  LOWER EXTREMITY MMT:  SLS left 3 sec; right SLS 1 sec with pelvic drop;  quadruped bird dog pelvic drop and compensatory rotation right/left Frequent HS cramps during movement testing  MMT Right eval Left eval 6/18  Hip flexion 4 4 4+  Hip extension 4- 4- 4  Hip abduction 3+ 3+ 4-  Hip adduction     Hip internal rotation     Hip external rotation 4- 4- 4  Knee flexion 4 4 4   Knee extension 4 4 4   Ankle dorsiflexion     Ankle plantarflexion     Ankle inversion     Ankle eversion      (Blank rows = not tested)  UPPER BACK STRENGTH:  mid traps  L 5  R 5 , Low traps L 3+  R, Rhomboids  L 4+/5  R  4+/5   FUNCTIONAL TESTS:  Able to rise from standard height chair without UE assist  GAIT: 11/12/22: : 1,303 feet Comments: no assistive device needed  TODAY'S TREATMENT:                                                                                                                              DATE:  12/15/22 NuStep new L5 x 10' PT present to discuss status Assessed upper back strength: see objectiveStanding rows 10# x 10 Lawn mower pull 5# x 10 B Standing horizontal ABD 2 x 10 GTB Standing diagonals GTB L x 10, no weight on R and did against the wall Seated X to V with looking up to encourage thoracic extension x 10 Seated OH reach with ball x 10 Supine thoracic extension over foam roller Standing MFR with ball to thoracic paraspinals Leg press 70lb B LE 2x10: 35# single leg 20x right/left 5 for second set recline 3  12/03/22: NuStep new L5 x 10' PT present to discuss status Hip hinge assessment as if watering plants 5# kettlebell 10x cues for hips back rather than squat 6 inch step ups 5x right/left  Leg press 70lb bil LE x20: 35# single leg 20x right/left seat 6 recline 3 Cable side step 5# 5x right/left UE pull: lat bar pulls 30#:  2 sets of 10 Heel drops (emphasizing a loud drop for bone building) 10x Stomping feet   for bone building 10x Neuromuscular re-education: muscle activation with verbal and tactile cues to improve stability and reduce loss of balance ;  toe tap on circles random, cognitive dual task close supervision but no loss of balance    12/01/22: NuStep L1 x 10' PT present to discuss plan for session Sit to stand  5# kettle bell 5x; press 5# weight overhead 5x Chair sit ups 5# kettlebell 5x; press 5# overhead 5x railing push ups:    20      reps UE pull: lat bar pulls 30#:  2 sets of 10 Lunge backward as if going to kneel on  green pod 2 sets of 5;  Green band (too easy) resisted walk anchored in door: backwards, forward, side stepping 5x each; tried with black band and given one for home with a handle 3 reps each direction Heel drops (emphasizing a loud drop for bone building) 10x Stomping feet  for bone building 10x   PATIENT EDUCATION:  Education details: Educated patient on anatomy and physiology of current symptoms, prognosis, plan of care as well as initial self care strategies to promote recovery  Person educated: Patient Education method: Explanation Education comprehension: verbalized understanding  HOME EXERCISE PROGRAM: Access Code: [email or web address] URL: [email or web address] Date: 12/15/2022 Prepared by: Raynelle Fanning  Exercises - Clamshell  - 1 x daily - 7 x weekly - 1 sets - 10 reps - Sidelying Hip Abduction  - 1 x daily - 7 x weekly - 1 sets - 10 reps - Hip Flexor Stretch at Edge of Bed  - 1 x daily - 7 x weekly - 1 sets - 2-3 reps - 30 hold - Heel Toe Raises with Counter Support  - 1 x daily - 7 x weekly - 3 sets - 10 reps - Standing Hip Abduction with Counter Support  - 1 x daily - 7 x weekly - 3 sets - 10 reps - Standing Hip Extension with Counter Support  - 1 x daily - 7 x weekly - 3 sets - 10 reps - Side Stepping with Resistance at Ankles and Counter Support  - 1 x daily - 7 x weekly - 3 sets - 10 reps - Standing Row with Anchored Resistance  - 1 x daily - 7 x  weekly - 3 sets - 10 reps - Standing Shoulder Extension with Resistance  - 1 x daily - 7 x weekly - 3 sets - 10 reps - Sit to Stand Without Arm Support  - 1 x daily - 7 x weekly - 2 sets - 10 reps - Bird Dog  - 1 x daily - 7 x weekly - 1 sets - 5 reps - Seated Hamstring Stretch  - 1 x daily - 7 x weekly - 1 sets - 2-3 reps - 20-30 sec hold - RESISTED BACKWARD WALK  - 1 x daily - 7 x weekly - 1 sets - 1 reps - Standing Heel Raise with Support  - 1 x daily - 7 x weekly - 1 sets - 10 reps - Standing March with Counter Support  - 1 x daily - 7 x weekly - 1 sets - 10 reps - Seated Thoracic Extension AROM  - 1 x daily - 3-4 x weekly - 1-2 sets - 10 reps - Seated Thoracic Extension Arms Overhead  - 1 x daily - 3-4 x weekly - 1-2 sets - 10 reps - 2-3 sec hold  ASSESSMENT:  CLINICAL IMPRESSION: Patient reports with new order to address upper and mid back pain. Assessment of mid back indicates weakness in B low traps and rhomboids. She has limited range of motion of right shoulder due to previous surgery, but able to tolerated strengthening. We focused on thoracic extension exercises and STM to address tight musculature as well. Naryiah will benefit from upper back strengthening in combination with her current core exercises to decrease pain and to help meet her LTGs #2 and #3.   OBJECTIVE IMPAIRMENTS: decreased activity tolerance, decreased strength, impaired perceived functional ability, and pain.   ACTIVITY LIMITATIONS: carrying, lifting, bending, standing, and locomotion level  PARTICIPATION LIMITATIONS: cleaning, community activity, and yard work  PERSONAL FACTORS: 3+ comorbidities: multi regions affected, fibromyalgia, time since onset , osteoporosis are also affecting patient's functional outcome.   REHAB POTENTIAL: Good  CLINICAL DECISION MAKING: Evolving/moderate complexity  EVALUATION COMPLEXITY: Moderate   GOALS: Goals reviewed with patient? Yes  SHORT TERM GOALS: Target date:  12/01/2022    The patient will demonstrate knowledge of basic self care strategies, osteoporosis education and exercises to promote health Baseline: Goal status: met 6/18 2.  The patient will have improved bil hip strength to at least 4/5 needed for standing, walking longer distances and descending stairs at home and in the community  Baseline:  Goal status: met 6/18  3.  The patient will have improved trunk flexor and extensor muscle strength to at least 4/5 needed for lifting medium weight objects such as grocery bags Baseline:  Goal status: met 6/18 4.  Patient will demo hip hinge and squat needed for watering her plants  Baseline:  Goal status: met 6/20    LONG TERM GOALS: Target date: 12/29/2022    The patient will be independent in a safe self progression of a home exercise program to promote further recovery of function  Baseline:  Goal status: INITIAL  2.  The patient will report a 50% improvement in pain levels with functional activities which are currently difficult including standing and walking for longer periods of time and housework Baseline:  no pain reported today, this varies (11/25/22) Goal status: IN PROGRESS  3.  The patient will have improved trunk flexor and extensor muscle strength to at least 4+/5 needed for lifting medium weight objects such as  luggage for bus trip to [place] Baseline:  Goal status: INITIAL  4.  The patient will have improved gait stamina and speed needed to ambulate 800 feet in 6 minutes  Baseline: 1303 (11/12/22) Goal status: MET  5.  Modified Oswestry improved to 24% indicating improved function with less pain Baseline:  Goal status: INITIAL    PLAN:  PT FREQUENCY: 2x/week  PT DURATION: 8 weeks  PLANNED INTERVENTIONS: Therapeutic exercises, Therapeutic activity, Neuromuscular re-education, Balance training, Gait training, Patient/Family education, Self Care, Joint mobilization, Aquatic Therapy, Dry Needling, Moist  heat, Taping, Manual therapy, and Re-evaluation.  PLAN FOR NEXT SESSION: increase weight on leg press;  continue with weight training including upper back strengthening (new order 12/15/22) and added balance challenge  Solon Palm, PT 12/15/22 1:46 PM Phone: [phone] Fax: [phone]

## 2022-12-14 ENCOUNTER — Ambulatory Visit: Payer: Medicare HMO | Admitting: Psychiatry

## 2022-12-15 ENCOUNTER — Encounter: Payer: No Typology Code available for payment source | Admitting: Physical Therapy

## 2022-12-15 ENCOUNTER — Encounter: Payer: Self-pay | Admitting: Physical Therapy

## 2022-12-15 ENCOUNTER — Ambulatory Visit: Payer: Medicare HMO | Attending: Family Medicine | Admitting: Physical Therapy

## 2022-12-15 DIAGNOSIS — R293 Abnormal posture: Secondary | ICD-10-CM | POA: Diagnosis not present

## 2022-12-15 DIAGNOSIS — M5459 Other low back pain: Secondary | ICD-10-CM | POA: Diagnosis not present

## 2022-12-15 DIAGNOSIS — R1312 Dysphagia, oropharyngeal phase: Secondary | ICD-10-CM | POA: Diagnosis not present

## 2022-12-15 DIAGNOSIS — M542 Cervicalgia: Secondary | ICD-10-CM | POA: Diagnosis not present

## 2022-12-15 DIAGNOSIS — R2689 Other abnormalities of gait and mobility: Secondary | ICD-10-CM | POA: Diagnosis not present

## 2022-12-15 DIAGNOSIS — M546 Pain in thoracic spine: Secondary | ICD-10-CM | POA: Diagnosis not present

## 2022-12-15 DIAGNOSIS — M6281 Muscle weakness (generalized): Secondary | ICD-10-CM | POA: Diagnosis not present

## 2022-12-15 DIAGNOSIS — M810 Age-related osteoporosis without current pathological fracture: Secondary | ICD-10-CM | POA: Insufficient documentation

## 2022-12-15 DIAGNOSIS — M549 Dorsalgia, unspecified: Secondary | ICD-10-CM | POA: Diagnosis not present

## 2022-12-16 ENCOUNTER — Other Ambulatory Visit: Payer: Self-pay | Admitting: Psychiatry

## 2022-12-16 DIAGNOSIS — F4310 Post-traumatic stress disorder, unspecified: Secondary | ICD-10-CM

## 2022-12-16 DIAGNOSIS — F339 Major depressive disorder, recurrent, unspecified: Secondary | ICD-10-CM

## 2022-12-18 DIAGNOSIS — M533 Sacrococcygeal disorders, not elsewhere classified: Secondary | ICD-10-CM | POA: Diagnosis not present

## 2022-12-18 DIAGNOSIS — M961 Postlaminectomy syndrome, not elsewhere classified: Secondary | ICD-10-CM | POA: Diagnosis not present

## 2022-12-18 DIAGNOSIS — M47812 Spondylosis without myelopathy or radiculopathy, cervical region: Secondary | ICD-10-CM | POA: Diagnosis not present

## 2022-12-18 DIAGNOSIS — M5136 Other intervertebral disc degeneration, lumbar region: Secondary | ICD-10-CM | POA: Diagnosis not present

## 2022-12-18 NOTE — Telephone Encounter (Signed)
Pt lvm that she needs to come off the cymbalta it makes her angry. She also doesn't have any patience with her dog. Please call her at [phone]

## 2022-12-22 ENCOUNTER — Ambulatory Visit: Payer: Medicare HMO | Admitting: Physical Therapy

## 2022-12-22 ENCOUNTER — Encounter: Payer: Self-pay | Admitting: Physical Therapy

## 2022-12-22 ENCOUNTER — Ambulatory Visit: Payer: Medicare HMO

## 2022-12-22 DIAGNOSIS — M5459 Other low back pain: Secondary | ICD-10-CM

## 2022-12-22 DIAGNOSIS — M6281 Muscle weakness (generalized): Secondary | ICD-10-CM

## 2022-12-22 DIAGNOSIS — M810 Age-related osteoporosis without current pathological fracture: Secondary | ICD-10-CM

## 2022-12-22 DIAGNOSIS — R2689 Other abnormalities of gait and mobility: Secondary | ICD-10-CM | POA: Diagnosis not present

## 2022-12-22 DIAGNOSIS — M542 Cervicalgia: Secondary | ICD-10-CM | POA: Diagnosis not present

## 2022-12-22 DIAGNOSIS — R293 Abnormal posture: Secondary | ICD-10-CM | POA: Diagnosis not present

## 2022-12-22 DIAGNOSIS — R1312 Dysphagia, oropharyngeal phase: Secondary | ICD-10-CM | POA: Diagnosis not present

## 2022-12-22 NOTE — Telephone Encounter (Signed)
LVM to [facility] 

## 2022-12-22 NOTE — Therapy (Signed)
OUTPATIENT PHYSICAL THERAPY THORACOLUMBAR TREATMENT   Patient Name: PENELOPY BIRNBAUM MRN: [id number] DOB:1956/05/22, 67 y.o., female Today's Date: 11/03/2022  END OF SESSION:  PT End of Session - 12/22/22 1600     Visit Number 7    Number of Visits 16    Date for PT Re-Evaluation 12/29/22    Authorization Type [employer] 16 visits    PT Start Time 1400    Activity Tolerance Patient tolerated treatment well    Behavior During Therapy WFL for tasks assessed/performed               Past Medical History:  Diagnosis Date   Allergic rhinitis    Anxiety    Arthritis    Cancer (HCC)    skin   Chronic pain    Chronically dry eyes    Depression    Fibromyalgia    GERD (gastroesophageal reflux disease)    Headache    Osteoporosis    Sleep apnea    Stroke (HCC)    from gabapenting per pt no deficits   Vitamin D deficiency    Wears glasses    Past Surgical History:  Procedure Laterality Date   ABDOMINAL HYSTERECTOMY     BACK SURGERY     BUNIONECTOMY Bilateral    EYE SURGERY     laser surgery   NASAL SINUS SURGERY     x2   REVERSE SHOULDER ARTHROPLASTY Right 07/03/2022   Procedure: REVERSE SHOULDER ARTHROPLASTY;  Surgeon: Beverely Low, MD;  Location: [facility];  Service: Orthopedics;  Laterality: Right;  120 min choice with interscalene block   SACROILIAC JOINT FUSION Bilateral 09/12/2020   Procedure: REMOVAL OF BILATERAL PELVIC SCREWS;  Surgeon: Estill Bamberg, MD;  Location: [facility];  Service: Orthopedics;  Laterality: Bilateral;   SKIN CANCER EXCISION     face x3   TUBAL LIGATION     Patient Active Problem List   Diagnosis Date Noted   S/P shoulder replacement, right 07/03/2022   Allergic contact dermatitis due to other agents 04/13/2022   S/P lumbar microdiscectomy 04/30/2021   S/P hardware removal 09/12/2020   Radiculopathy 06/29/2019   Cervical radiculopathy 08/15/2018   Chronic sinusitis 07/27/2018   Constipation 07/27/2018   Chronic bilateral low back  pain with bilateral sciatica 07/01/2018   Disorder of bone and cartilage 04/04/2018   Esophageal reflux 04/04/2018   Other and unspecified hyperlipidemia 04/04/2018   Other malaise and fatigue 04/04/2018   Pain in joint involving ankle and foot 04/04/2018   Leg pain 04/04/2018   Posttraumatic stress disorder 04/04/2018   Raynaud's phenomenon 04/04/2018   Skin sensation disturbance 04/04/2018   Sleep disturbance 04/04/2018   Vitamin D deficiency 04/04/2018   Myoclonus, segmental 10/18/2017   Adjacent segment disease with spinal stenosis 10/04/2017   DDD (degenerative disc disease), lumbar 10/30/2016   Spondylolisthesis of lumbar region 08/21/2016   Overweight with body mass index (BMI) 25.0-29.9 07/22/2016   Capsulitis of metatarsophalangeal (MTP) joint of right foot 03/23/2016   Lumbar spinal stenosis 12/05/2015   Migraine 09/13/2012   Other allergic rhinitis 09/13/2012   Neuropathy 09/13/2012    PCP: Moshe Cipro NP  REFERRING PROVIDER: Moshe Cipro NP  REFERRING DIAG:  [phone] (ICD-10-CM) - Spinal stenosis, lumbar region without neurogenic claudication  M79.7 (ICD-10-CM) - Fibromyalgia  G89.4 (ICD-10-CM) - Chronic pain syndrome  M81.0 (ICD-10-CM) - Age-related osteoporosis without current pathological fracture    Rationale for Evaluation and Treatment: Rehabilitation  THERAPY DIAG:  Back pain, weakness, osteoporosis  ONSET  DATE: 06/15/2022 (exacerbation of chronic problem)  SUBJECTIVE:                                                                                                                                                                                           SUBJECTIVE STATEMENT:  Pt states that things are going well. She is still under the same restrictions of no bending or twisting until the end of the month.   PERTINENT HISTORY:   spinal cord stimulator next week (May) no twisting, no bending until end of July but sees MD early July; no  aquatic PT for 4 months I had an ablation several months ago and I'll never do it again  Reverse TSR. Surgery on 07-03-22   L3-4 andL5-S1 PLIF 2023; fibromyalgia, chronic pain,  OA,  osteoporosis (unsure what areas are impacted) Depression Osteoporosis: T score: -2.9 proximal femur, forearm -1.6 PAIN:  PAIN:  Are you having pain? no NPRS scale: 5/10 Pain location: between the shoulders Aggravating factors: stood too long, walk extra, extra housecleaning (energy depleting) Relieving factors: putting feet up with sitting or lying down for 20 min  PRECAUTIONS: None  WEIGHT BEARING RESTRICTIONS: No  FALLS:  Has patient fallen in last 6 months? No my balance could be better though  LIVING ENVIRONMENT: Lives in: House/apartment Stairs: 1 step to enter   OCCUPATION: retired Enjoys walking, reading, listen to music, tend to plants on deck with hose Has a black lab  PLOF: Independent  PATIENT GOALS: 4 day bus trip in October; I'd like to walk in the [place] and feel mobile   NEXT MD VISIT: every 6 weeks  OBJECTIVE:   DIAGNOSTIC FINDINGS:  Pt will look for Dexa scan results in her records   PATIENT SURVEYS:  Modified Oswestry 30% moderate self perceived disability   COGNITION: Overall cognitive status: Within functional limits for tasks assessed      MUSCLE LENGTH: Dec bil hip flexor lengths: 8 degrees bil of hip extension  POSTURE: decreased lumbar lordosis and increased thoracic kyphosis   LUMBAR ROM:  Uses a squat method to reach floor with ease; unable to lie prone  AROM eval  Flexion WFLs  Extension 5 degrees  Right lateral flexion   Left lateral flexion   Right rotation   Left rotation    (Blank rows = not tested)  TRUNK STRENGTH:  Decreased activation of transverse abdominus muscles; abdominals 4-/5; decreased activation of lumbar multifidi; trunk extensors 4-/5 6/18:  abdominals and trunk extensors grossly 4/5  LOWER EXTREMITY ROM:    WFLs  LOWER EXTREMITY MMT:  SLS left 3 sec; right SLS 1 sec with  pelvic drop;  quadruped bird dog pelvic drop and compensatory rotation right/left Frequent HS cramps during movement testing  MMT Right eval Left eval 6/18  Hip flexion 4 4 4+  Hip extension 4- 4- 4  Hip abduction 3+ 3+ 4-  Hip adduction     Hip internal rotation     Hip external rotation 4- 4- 4  Knee flexion 4 4 4   Knee extension 4 4 4   Ankle dorsiflexion     Ankle plantarflexion     Ankle inversion     Ankle eversion      (Blank rows = not tested)  UPPER BACK STRENGTH:  mid traps  L 5  R 5 , Low traps L 3+  R, Rhomboids  L 4+/5  R  4+/5   FUNCTIONAL TESTS:  Able to rise from standard height chair without UE assist  GAIT: 11/12/22: : 1,303 feet Comments: no assistive device needed  TODAY'S TREATMENT:                                                                                                                              DATE:  12/22/22 Nustep L1 x7 min PT present to discuss progress Standing BUE press downs with green TB 2x10 reps  Supine shoulder horizontal abduction red TB 2x10 reps Supine shoulder ER yellow TB 2x10 reps  Sidesteping yellow TB around toes 2x5 reps each direction, repeat x2 Standing hip abduction/extension toe tap hold 3x5 sec hold each side Standing hip flexor stretch with UE reach at step 2x20 sec  12/15/22 NuStep new L5 x 10' PT present to discuss status Assessed upper back strength: see objectiveStanding rows 10# x 10 Lawn mower pull 5# x 10 B Standing horizontal ABD 2 x 10 GTB Standing diagonals GTB L x 10, no weight on R and did against the wall Seated X to V with looking up to encourage thoracic extension x 10 Seated OH reach with ball x 10 Supine thoracic extension over foam roller Standing MFR with ball to thoracic paraspinals Leg press 70lb B LE 2x10: 35# single leg 20x right/left 5 for second set recline 3  12/03/22: NuStep new L5 x 10' PT present to discuss  status Hip hinge assessment as if watering plants 5# kettlebell 10x cues for hips back rather than squat 6 inch step ups 5x right/left  Leg press 70lb bil LE x20: 35# single leg 20x right/left seat 6 recline 3 Cable side step 5# 5x right/left UE pull: lat bar pulls 30#:  2 sets of 10 Heel drops (emphasizing a loud drop for bone building) 10x Stomping feet  for bone building 10x Neuromuscular re-education: muscle activation with verbal and tactile cues to improve stability and reduce loss of balance ;  toe tap on circles random, cognitive dual task close supervision but no loss of balance    12/01/22: NuStep L1 x 10' PT present to discuss plan for session Sit to stand  5#  kettle bell 5x; press 5# weight overhead 5x Chair sit ups 5# kettlebell 5x; press 5# overhead 5x railing push ups:    20      reps UE pull: lat bar pulls 30#:  2 sets of 10 Lunge backward as if going to kneel on green pod 2 sets of 5;  Green band (too easy) resisted walk anchored in door: backwards, forward, side stepping 5x each; tried with black band and given one for home with a handle 3 reps each direction Heel drops (emphasizing a loud drop for bone building) 10x Stomping feet  for bone building 10x   PATIENT EDUCATION:  Education details: Educated patient on anatomy and physiology of current symptoms, prognosis, plan of care as well as initial self care strategies to promote recovery  Person educated: Patient Education method: Explanation Education comprehension: verbalized understanding  HOME EXERCISE PROGRAM: Access Code: [email or web address] URL: [email or web address] Date: 12/22/2022 Prepared by: [facility] - [facility]  Exercises - Hip Flexor Stretch at [facility] of Bed  - 1 x daily - 7 x weekly - 1 sets - 2-3 reps - 30 hold - Standing Row with Anchored Resistance  - 1 x daily - 7 x weekly - 3 sets - 10 reps - Standing Shoulder Extension with Resistance  - 1 x daily - 7  x weekly - 3 sets - 10 reps - Sit to Stand Without Arm Support  - 1 x daily - 7 x weekly - 2 sets - 10 reps - Bird Dog  - 1 x daily - 7 x weekly - 1 sets - 5 reps - Seated Hamstring Stretch  - 1 x daily - 7 x weekly - 1 sets - 2-3 reps - 20-30 sec hold - Standing March with Counter Support  - 1 x daily - 7 x weekly - 1 sets - 10 reps - Seated Thoracic Extension Arms Overhead  - 1 x daily - 3-4 x weekly - 1-2 sets - 10 reps - 2-3 sec hold - Standing Hip Abduction with Unilateral Counter Support  - 1 x daily - 7 x weekly - 3 sets - 5 reps - 10 seconds hold - Standing Hip Extension with Counter Support  - 1 x daily - 7 x weekly - 3 sets - 10 reps - Single Leg Heel Raise with Counter Support  - 1 x daily - 7 x weekly - 1 sets - 10 reps  ASSESSMENT:  CLINICAL IMPRESSION: Today's session focused on therex to improve upper and lower back strength/endurance. Pt has only been completing her HEP once a week, so PT made some adjustments and encouraged her to complete more frequently moving forward. Pt required minimal cuing for technique during her session, but PT had to slow her repetitions or counts at times. She tolerated all exercises and HEP adjustments without increase in pain.    OBJECTIVE IMPAIRMENTS: decreased activity tolerance, decreased strength, impaired perceived functional ability, and pain.   ACTIVITY LIMITATIONS: carrying, lifting, bending, standing, and locomotion level  PARTICIPATION LIMITATIONS: cleaning, community activity, and yard work  PERSONAL FACTORS: 3+ comorbidities: multi regions affected, fibromyalgia, time since onset , osteoporosis are also affecting patient's functional outcome.   REHAB POTENTIAL: Good  CLINICAL DECISION MAKING: Evolving/moderate complexity  EVALUATION COMPLEXITY: Moderate   GOALS: Goals reviewed with patient? Yes  SHORT TERM GOALS: Target date: 12/01/2022    The patient will demonstrate knowledge of basic self care strategies, osteoporosis  education and exercises to promote health Baseline:  Goal status: met 6/18 2.  The patient will have improved bil hip strength to at least 4/5 needed for standing, walking longer distances and descending stairs at home and in the community  Baseline:  Goal status: met 6/18  3.  The patient will have improved trunk flexor and extensor muscle strength to at least 4/5 needed for lifting medium weight objects such as grocery bags Baseline:  Goal status: met 6/18 4.  Patient will demo hip hinge and squat needed for watering her plants  Baseline:  Goal status: met 6/20    LONG TERM GOALS: Target date: 12/29/2022    The patient will be independent in a safe self progression of a home exercise program to promote further recovery of function  Baseline:  Goal status: INITIAL  2.  The patient will report a 50% improvement in pain levels with functional activities which are currently difficult including standing and walking for longer periods of time and housework Baseline:  no pain reported today, this varies (11/25/22) Goal status: IN PROGRESS  3.  The patient will have improved trunk flexor and extensor muscle strength to at least 4+/5 needed for lifting medium weight objects such as  luggage for bus trip [place] Baseline:  Goal status: INITIAL  4.  The patient will have improved gait stamina and speed needed to ambulate 800 feet in 6 minutes  Baseline: 1303 (11/12/22) Goal status: MET  5.  Modified Oswestry improved to 24% indicating improved function with less pain Baseline:  Goal status: INITIAL    PLAN:  PT FREQUENCY: 2x/week  PT DURATION: 8 weeks  PLANNED INTERVENTIONS: Therapeutic exercises, Therapeutic activity, Neuromuscular re-education, Balance training, Gait training, Patient/Family education, Self Care, Joint mobilization, Aquatic Therapy, Dry Needling, Moist heat, Taping, Manual therapy, and Re-evaluation.  PLAN FOR NEXT SESSION: increase weight on leg  press;  continue with weight training including upper back strengthening (new order 12/15/22); balance progressions as needed  4:45 PM,12/22/22 Donita Brooks PT, DPT [facility]  [phone]

## 2022-12-24 ENCOUNTER — Ambulatory Visit: Payer: Medicare HMO | Admitting: Physical Therapy

## 2022-12-24 ENCOUNTER — Encounter: Payer: Self-pay | Admitting: Physical Therapy

## 2022-12-24 DIAGNOSIS — R2689 Other abnormalities of gait and mobility: Secondary | ICD-10-CM | POA: Diagnosis not present

## 2022-12-24 DIAGNOSIS — R1312 Dysphagia, oropharyngeal phase: Secondary | ICD-10-CM | POA: Diagnosis not present

## 2022-12-24 DIAGNOSIS — M542 Cervicalgia: Secondary | ICD-10-CM | POA: Diagnosis not present

## 2022-12-24 DIAGNOSIS — M810 Age-related osteoporosis without current pathological fracture: Secondary | ICD-10-CM

## 2022-12-24 DIAGNOSIS — M6281 Muscle weakness (generalized): Secondary | ICD-10-CM | POA: Diagnosis not present

## 2022-12-24 DIAGNOSIS — R293 Abnormal posture: Secondary | ICD-10-CM | POA: Diagnosis not present

## 2022-12-24 DIAGNOSIS — M5459 Other low back pain: Secondary | ICD-10-CM

## 2022-12-24 NOTE — Therapy (Signed)
OUTPATIENT PHYSICAL THERAPY THORACOLUMBAR TREATMENT   Patient Name: Sharon Logan MRN: [id number] DOB:[birth year], 67 y.o., female Today's Date: 11/03/2022  END OF SESSION:  PT End of Session - 12/24/22 1149     Visit Number 8    Number of Visits 16    Date for PT Re-Evaluation 12/29/22    Authorization Type [employer] 16 visits    PT Start Time 1105    PT Stop Time 1145    PT Time Calculation (min) 40 min    Activity Tolerance Patient tolerated treatment well    Behavior During Therapy WFL for tasks assessed/performed                Past Medical History:  Diagnosis Date   Allergic rhinitis    Anxiety    Arthritis    Cancer (HCC)    skin   Chronic pain    Chronically dry eyes    Depression    Fibromyalgia    GERD (gastroesophageal reflux disease)    Headache    Osteoporosis    Sleep apnea    Stroke (HCC)    from gabapenting per pt no deficits   Vitamin D deficiency    Wears glasses    Past Surgical History:  Procedure Laterality Date   ABDOMINAL HYSTERECTOMY     BACK SURGERY     BUNIONECTOMY Bilateral    EYE SURGERY     laser surgery   NASAL SINUS SURGERY     x2   REVERSE SHOULDER ARTHROPLASTY Right 07/03/2022   Procedure: REVERSE SHOULDER ARTHROPLASTY;  Surgeon: Beverely Low, MD;  Location: [facility];  Service: Orthopedics;  Laterality: Right;  120 min choice with interscalene block   SACROILIAC JOINT FUSION Bilateral 09/12/2020   Procedure: REMOVAL OF BILATERAL PELVIC SCREWS;  Surgeon: Estill Bamberg, MD;  Location: [facility];  Service: Orthopedics;  Laterality: Bilateral;   SKIN CANCER EXCISION     face x3   TUBAL LIGATION     Patient Active Problem List   Diagnosis Date Noted   S/P shoulder replacement, right 07/03/2022   Allergic contact dermatitis due to other agents 04/13/2022   S/P lumbar microdiscectomy 04/30/2021   S/P hardware removal 09/12/2020   Radiculopathy 06/29/2019   Cervical radiculopathy 08/15/2018   Chronic sinusitis  07/27/2018   Constipation 07/27/2018   Chronic bilateral low back pain with bilateral sciatica 07/01/2018   Disorder of bone and cartilage 04/04/2018   Esophageal reflux 04/04/2018   Other and unspecified hyperlipidemia 04/04/2018   Other malaise and fatigue 04/04/2018   Pain in joint involving ankle and foot 04/04/2018   Leg pain 04/04/2018   Posttraumatic stress disorder 04/04/2018   Raynaud's phenomenon 04/04/2018   Skin sensation disturbance 04/04/2018   Sleep disturbance 04/04/2018   Vitamin D deficiency 04/04/2018   Myoclonus, segmental 10/18/2017   Adjacent segment disease with spinal stenosis 10/04/2017   DDD (degenerative disc disease), lumbar 10/30/2016   Spondylolisthesis of lumbar region 08/21/2016   Overweight with body mass index (BMI) 25.0-29.9 07/22/2016   Capsulitis of metatarsophalangeal (MTP) joint of right foot 03/23/2016   Lumbar spinal stenosis 12/05/2015   Migraine 09/13/2012   Other allergic rhinitis 09/13/2012   Neuropathy 09/13/2012    PCP: Moshe Cipro NP  REFERRING PROVIDER: Moshe Cipro NP  REFERRING DIAG:  [phone] (ICD-10-CM) - Spinal stenosis, lumbar region without neurogenic claudication  M79.7 (ICD-10-CM) - Fibromyalgia  G89.4 (ICD-10-CM) - Chronic pain syndrome  M81.0 (ICD-10-CM) - Age-related osteoporosis without current pathological fracture  Rationale for Evaluation and Treatment: Rehabilitation  THERAPY DIAG:  Back pain, weakness, osteoporosis  ONSET DATE: 06/15/2022 (exacerbation of chronic problem)  SUBJECTIVE:                                                                                                                                                                                           SUBJECTIVE STATEMENT: No complaints of pain today.    PERTINENT HISTORY:   spinal cord stimulator next week (May) no twisting, no bending until end of July but sees MD early July; no aquatic PT for 4 months I had an  ablation several months ago and I'll never do it again  Reverse TSR. Surgery on 07-03-22   L3-4 andL5-S1 PLIF 2023; fibromyalgia, chronic pain,  OA,  osteoporosis (unsure what areas are impacted) Depression Osteoporosis: T score: -2.9 proximal femur, forearm -1.6 PAIN:  PAIN:  Are you having pain? no NPRS scale: 5/10 Pain location: between the shoulders Aggravating factors: stood too long, walk extra, extra housecleaning (energy depleting) Relieving factors: putting feet up with sitting or lying down for 20 min  PRECAUTIONS: None  WEIGHT BEARING RESTRICTIONS: No  FALLS:  Has patient fallen in last 6 months? No my balance could be better though  LIVING ENVIRONMENT: Lives in: House/apartment Stairs: 1 step to enter   OCCUPATION: retired Enjoys walking, reading, listen to music, tend to plants on deck with hose Has a black lab  PLOF: Independent  PATIENT GOALS: 4 day bus trip in October; I'd like to walk in the [place] and feel mobile   NEXT MD VISIT: every 6 weeks  OBJECTIVE:   DIAGNOSTIC FINDINGS:  Pt will look for Dexa scan results in her records   PATIENT SURVEYS:  Modified Oswestry 30% moderate self perceived disability   COGNITION: Overall cognitive status: Within functional limits for tasks assessed      MUSCLE LENGTH: Dec bil hip flexor lengths: 8 degrees bil of hip extension  POSTURE: decreased lumbar lordosis and increased thoracic kyphosis   LUMBAR ROM:  Uses a squat method to reach floor with ease; unable to lie prone  AROM eval  Flexion WFLs  Extension 5 degrees  Right lateral flexion   Left lateral flexion   Right rotation   Left rotation    (Blank rows = not tested)  TRUNK STRENGTH:  Decreased activation of transverse abdominus muscles; abdominals 4-/5; decreased activation of lumbar multifidi; trunk extensors 4-/5 6/18:  abdominals and trunk extensors grossly 4/5  LOWER EXTREMITY ROM:   WFLs  LOWER EXTREMITY MMT:  SLS left 3  sec; right SLS 1 sec with pelvic drop;  quadruped  bird dog pelvic drop and compensatory rotation right/left Frequent HS cramps during movement testing  MMT Right eval Left eval 6/18  Hip flexion 4 4 4+  Hip extension 4- 4- 4  Hip abduction 3+ 3+ 4-  Hip adduction     Hip internal rotation     Hip external rotation 4- 4- 4  Knee flexion 4 4 4   Knee extension 4 4 4   Ankle dorsiflexion     Ankle plantarflexion     Ankle inversion     Ankle eversion      (Blank rows = not tested)  UPPER BACK STRENGTH:  mid traps  L 5  R 5 , Low traps L 3+  R, Rhomboids  L 4+/5  R  4+/5   FUNCTIONAL TESTS:  Able to rise from standard height chair without UE assist  GAIT: 11/12/22: : 1,303 feet Comments: no assistive device needed  TODAY'S TREATMENT:                                                                                                                              DATE:  12/24/22 Nustep L6 x7 min PT present to discuss progress Standing at wall shoulder horizontal abduction green TB 2x10 reps Standing BUE press downs with green TB 2x10 reps  Seated X to V with looking up to encourage thoracic extension 2 x 10 Seated B OH flexion with GTB for ABD tension 1x10; 1x10 with ball Sit to stand with Black loop around thighs x 10, then with 5# KB x 10 Cable side step 5# 5x right/left (can increase wt); fwd/bwd x 5 ea UE pull: lat bar pulls 30#:  2 sets of 10 Sidesteping yellow TB around toes 2x10 reps each direction, repeat x2 Standing hip abduction/extension toe tap hold 3x5 sec hold each side one UE support  12/22/22 Nustep L1 x7 min PT present to discuss progress Standing BUE press downs with green TB 2x10 reps  Supine shoulder horizontal abduction red TB 2x10 reps Supine shoulder ER yellow TB 2x10 reps  Sidesteping yellow TB around toes 2x5 reps each direction, repeat x2 Standing hip abduction/extension toe tap hold 3x5 sec hold each side Standing hip flexor stretch with UE reach at  step 2x20 sec  12/15/22 NuStep new L5 x 10' PT present to discuss status Assessed upper back strength: see objectiveStanding rows 10# x 10 Lawn mower pull 5# x 10 B Standing horizontal ABD 2 x 10 GTB Standing diagonals GTB L x 10, no weight on R and did against the wall Seated X to V with looking up to encourage thoracic extension x 10 Seated OH reach with ball x 10 Supine thoracic extension over foam roller Standing MFR with ball to thoracic paraspinals Leg press 70lb B LE 2x10: 35# single leg 20x right/left 5 for second set recline 3  12/03/22: NuStep new L5 x 10' PT present to discuss status Hip hinge assessment as if watering plants 5#  kettlebell 10x cues for hips back rather than squat 6 inch step ups 5x right/left  Leg press 70lb bil LE x20: 35# single leg 20x right/left seat 6 recline 3 Cable side step 5# 5x right/left UE pull: lat bar pulls 30#:  2 sets of 10 Heel drops (emphasizing a loud drop for bone building) 10x Stomping feet  for bone building 10x Neuromuscular re-education: muscle activation with verbal and tactile cues to improve stability and reduce loss of balance ;  toe tap on circles random, cognitive dual task close supervision but no loss of balance    PATIENT EDUCATION:  Education details: Educated patient on anatomy and physiology of current symptoms, prognosis, plan of care as well as initial self care strategies to promote recovery  Person educated: Patient Education method: Explanation Education comprehension: verbalized understanding  HOME EXERCISE PROGRAM: Access Code: [email or web address] URL: [email or web address] Date: 12/22/2022 Prepared by: [facility] - Outpatient Rehab - [facility]  Exercises - Hip Flexor Stretch at [facility]-Main of Bed  - 1 x daily - 7 x weekly - 1 sets - 2-3 reps - 30 hold - Standing Row with Anchored Resistance  - 1 x daily - 7 x weekly - 3 sets - 10 reps - Standing Shoulder Extension with Resistance  - 1 x daily -  7 x weekly - 3 sets - 10 reps - Sit to Stand Without Arm Support  - 1 x daily - 7 x weekly - 2 sets - 10 reps - Bird Dog  - 1 x daily - 7 x weekly - 1 sets - 5 reps - Seated Hamstring Stretch  - 1 x daily - 7 x weekly - 1 sets - 2-3 reps - 20-30 sec hold - Standing March with Counter Support  - 1 x daily - 7 x weekly - 1 sets - 10 reps - Seated Thoracic Extension Arms Overhead  - 1 x daily - 3-4 x weekly - 1-2 sets - 10 reps - 2-3 sec hold - Standing Hip Abduction with Unilateral Counter Support  - 1 x daily - 7 x weekly - 3 sets - 5 reps - 10 seconds hold - Standing Hip Extension with Counter Support  - 1 x daily - 7 x weekly - 3 sets - 10 reps - Single Leg Heel Raise with Counter Support  - 1 x daily - 7 x weekly - 1 sets - 10 reps  ASSESSMENT:  CLINICAL IMPRESSION: Patient required minimal cueing today for form or pace. She will benefit from ongoing functional exercises with resistance to improve core strength and balance. POC up 12/29/22.   OBJECTIVE IMPAIRMENTS: decreased activity tolerance, decreased strength, impaired perceived functional ability, and pain.   ACTIVITY LIMITATIONS: carrying, lifting, bending, standing, and locomotion level  PARTICIPATION LIMITATIONS: cleaning, community activity, and yard work  PERSONAL FACTORS: 3+ comorbidities: multi regions affected, fibromyalgia, time since onset , osteoporosis are also affecting patient's functional outcome.   REHAB POTENTIAL: Good  CLINICAL DECISION MAKING: Evolving/moderate complexity  EVALUATION COMPLEXITY: Moderate   GOALS: Goals reviewed with patient? Yes  SHORT TERM GOALS: Target date: 12/01/2022    The patient will demonstrate knowledge of basic self care strategies, osteoporosis education and exercises to promote health Baseline: Goal status: met 6/18 2.  The patient will have improved bil hip strength to at least 4/5 needed for standing, walking longer distances and descending stairs at home and in the  community  Baseline:  Goal status: met 6/18  3.  The patient will  have improved trunk flexor and extensor muscle strength to at least 4/5 needed for lifting medium weight objects such as grocery bags Baseline:  Goal status: met 6/18 4.  Patient will demo hip hinge and squat needed for watering her plants  Baseline:  Goal status: met 6/20    LONG TERM GOALS: Target date: 12/29/2022    The patient will be independent in a safe self progression of a home exercise program to promote further recovery of function  Baseline:  Goal status: INITIAL  2.  The patient will report a 50% improvement in pain levels with functional activities which are currently difficult including standing and walking for longer periods of time and housework Baseline:  no pain reported today, this varies (11/25/22) Goal status: IN PROGRESS  3.  The patient will have improved trunk flexor and extensor muscle strength to at least 4+/5 needed for lifting medium weight objects such as  luggage for bus trip to [place] Baseline:  Goal status: INITIAL  4.  The patient will have improved gait stamina and speed needed to ambulate 800 feet in 6 minutes  Baseline: 1303 (11/12/22) Goal status: MET  5.  Modified Oswestry improved to 24% indicating improved function with less pain Baseline:  Goal status: INITIAL    PLAN:  PT FREQUENCY: 2x/week  PT DURATION: 8 weeks  PLANNED INTERVENTIONS: Therapeutic exercises, Therapeutic activity, Neuromuscular re-education, Balance training, Gait training, Patient/Family education, Self Care, Joint mobilization, Aquatic Therapy, Dry Needling, Moist heat, Taping, Manual therapy, and Re-evaluation.  PLAN FOR NEXT SESSION: increase weight on leg press;  continue with weight training including upper back strengthening (new order 12/15/22); balance progressions as needed  11:49 AM,12/24/22 Solon Palm, PT [facility] [place]   [phone]

## 2022-12-25 DIAGNOSIS — M542 Cervicalgia: Secondary | ICD-10-CM | POA: Diagnosis not present

## 2022-12-28 ENCOUNTER — Ambulatory Visit: Payer: Medicare HMO

## 2022-12-28 DIAGNOSIS — M542 Cervicalgia: Secondary | ICD-10-CM | POA: Diagnosis not present

## 2022-12-28 DIAGNOSIS — M5459 Other low back pain: Secondary | ICD-10-CM

## 2022-12-28 DIAGNOSIS — G4733 Obstructive sleep apnea (adult) (pediatric): Secondary | ICD-10-CM | POA: Diagnosis not present

## 2022-12-28 DIAGNOSIS — R1312 Dysphagia, oropharyngeal phase: Secondary | ICD-10-CM | POA: Diagnosis not present

## 2022-12-28 DIAGNOSIS — R293 Abnormal posture: Secondary | ICD-10-CM | POA: Diagnosis not present

## 2022-12-28 DIAGNOSIS — R2689 Other abnormalities of gait and mobility: Secondary | ICD-10-CM | POA: Diagnosis not present

## 2022-12-28 DIAGNOSIS — M6281 Muscle weakness (generalized): Secondary | ICD-10-CM | POA: Diagnosis not present

## 2022-12-28 DIAGNOSIS — M810 Age-related osteoporosis without current pathological fracture: Secondary | ICD-10-CM | POA: Diagnosis not present

## 2022-12-28 NOTE — Therapy (Signed)
OUTPATIENT PHYSICAL THERAPY THORACOLUMBAR TREATMENT   Patient Name: Sharon Logan MRN: [id number] DOB:28-Nov-1955, 67 y.o., female Today's Date: 11/03/2022  END OF SESSION:  PT End of Session - 12/28/22 1311     Visit Number 9    Date for PT Re-Evaluation 01/29/23    Authorization Type [employer] 16 visits 5/21-7/16.  Requested extension 12/28/22    Progress Note Due on Visit 10    PT Start Time 1235    PT Stop Time 1317    PT Time Calculation (min) 42 min    Activity Tolerance Patient tolerated treatment well    Behavior During Therapy WFL for tasks assessed/performed                 Past Medical History:  Diagnosis Date   Allergic rhinitis    Anxiety    Arthritis    Cancer (HCC)    skin   Chronic pain    Chronically dry eyes    Depression    Fibromyalgia    GERD (gastroesophageal reflux disease)    Headache    Osteoporosis    Sleep apnea    Stroke (HCC)    from gabapenting per pt no deficits   Vitamin D deficiency    Wears glasses    Past Surgical History:  Procedure Laterality Date   ABDOMINAL HYSTERECTOMY     BACK SURGERY     BUNIONECTOMY Bilateral    EYE SURGERY     laser surgery   NASAL SINUS SURGERY     x2   REVERSE SHOULDER ARTHROPLASTY Right 07/03/2022   Procedure: REVERSE SHOULDER ARTHROPLASTY;  Surgeon: Beverely Low, MD;  Location: [facility];  Service: Orthopedics;  Laterality: Right;  120 min choice with interscalene block   SACROILIAC JOINT FUSION Bilateral 09/12/2020   Procedure: REMOVAL OF BILATERAL PELVIC SCREWS;  Surgeon: Estill Bamberg, MD;  Location: [facility];  Service: Orthopedics;  Laterality: Bilateral;   SKIN CANCER EXCISION     face x3   TUBAL LIGATION     Patient Active Problem List   Diagnosis Date Noted   S/P shoulder replacement, right 07/03/2022   Allergic contact dermatitis due to other agents 04/13/2022   S/P lumbar microdiscectomy 04/30/2021   S/P hardware removal 09/12/2020   Radiculopathy 06/29/2019   Cervical  radiculopathy 08/15/2018   Chronic sinusitis 07/27/2018   Constipation 07/27/2018   Chronic bilateral low back pain with bilateral sciatica 07/01/2018   Disorder of bone and cartilage 04/04/2018   Esophageal reflux 04/04/2018   Other and unspecified hyperlipidemia 04/04/2018   Other malaise and fatigue 04/04/2018   Pain in joint involving ankle and foot 04/04/2018   Leg pain 04/04/2018   Posttraumatic stress disorder 04/04/2018   Raynaud's phenomenon 04/04/2018   Skin sensation disturbance 04/04/2018   Sleep disturbance 04/04/2018   Vitamin D deficiency 04/04/2018   Myoclonus, segmental 10/18/2017   Adjacent segment disease with spinal stenosis 10/04/2017   DDD (degenerative disc disease), lumbar 10/30/2016   Spondylolisthesis of lumbar region 08/21/2016   Overweight with body mass index (BMI) 25.0-29.9 07/22/2016   Capsulitis of metatarsophalangeal (MTP) joint of right foot 03/23/2016   Lumbar spinal stenosis 12/05/2015   Migraine 09/13/2012   Other allergic rhinitis 09/13/2012   Neuropathy 09/13/2012    PCP: Moshe Cipro NP  REFERRING PROVIDER: Moshe Cipro NP  REFERRING DIAG:  [phone] (ICD-10-CM) - Spinal stenosis, lumbar region without neurogenic claudication  M79.7 (ICD-10-CM) - Fibromyalgia  G89.4 (ICD-10-CM) - Chronic pain syndrome  M81.0 (ICD-10-CM) -  Age-related osteoporosis without current pathological fracture    Rationale for Evaluation and Treatment: Rehabilitation  THERAPY DIAG:  Back pain, weakness, osteoporosis  ONSET DATE: 06/15/2022 (exacerbation of chronic problem)  SUBJECTIVE:                                                                                                                                                                                           SUBJECTIVE STATEMENT: No complaints of pain today.    PERTINENT HISTORY:   spinal cord stimulator next week (May) no twisting, no bending until end of July but sees MD early  July; no aquatic PT for 4 months I had an ablation several months ago and I'll never do it again  Reverse TSR. Surgery on 07-03-22   L3-4 andL5-S1 PLIF 2023; fibromyalgia, chronic pain,  OA,  osteoporosis (unsure what areas are impacted) Depression Osteoporosis: T score: -2.9 proximal femur, forearm -1.6 PAIN:  PAIN:  Are you having pain? no NPRS scale: 5/10 Pain location: between the shoulders Aggravating factors: stood too long, walk extra, extra housecleaning (energy depleting) Relieving factors: putting feet up with sitting or lying down for 20 min  PRECAUTIONS: None  WEIGHT BEARING RESTRICTIONS: No  FALLS:  Has patient fallen in last 6 months? No my balance could be better though  LIVING ENVIRONMENT: Lives in: House/apartment Stairs: 1 step to enter   OCCUPATION: retired Enjoys walking, reading, listen to music, tend to plants on deck with hose Has a black lab  PLOF: Independent  PATIENT GOALS: 4 day bus trip in October; I'd like to walk in the [place] and feel mobile   NEXT MD VISIT: every 6 weeks  OBJECTIVE:   DIAGNOSTIC FINDINGS:  Pt will look for Dexa scan results in her records   PATIENT SURVEYS:  Modified Oswestry 30% moderate self perceived disability 12/28/22: Modified [place]: 24% perceived disability  ABC: 73.125% confidence    COGNITION: Overall cognitive status: Within functional limits for tasks assessed      MUSCLE LENGTH: Dec bil hip flexor lengths: 8 degrees bil of hip extension  POSTURE: decreased lumbar lordosis and increased thoracic kyphosis   LUMBAR ROM:  Uses a squat method to reach floor with ease; unable to lie prone  AROM eval  Flexion WFLs  Extension 5 degrees  Right lateral flexion   Left lateral flexion   Right rotation   Left rotation    (Blank rows = not tested)  TRUNK STRENGTH:  Decreased activation of transverse abdominus muscles; abdominals 4-/5; decreased activation of lumbar multifidi; trunk extensors  4-/5 6/18:  abdominals and trunk extensors grossly 4/5  LOWER EXTREMITY ROM:  WFLs  LOWER EXTREMITY MMT:  SLS left 3 sec; right SLS 1 sec with pelvic drop;  quadruped bird dog pelvic drop and compensatory rotation right/left Frequent HS cramps during movement testing  MMT Right eval Left eval 6/18  Hip flexion 4 4 4+  Hip extension 4- 4- 4  Hip abduction 3+ 3+ 4-  Hip adduction     Hip internal rotation     Hip external rotation 4- 4- 4  Knee flexion 4 4 4   Knee extension 4 4 4   Ankle dorsiflexion     Ankle plantarflexion     Ankle inversion     Ankle eversion      (Blank rows = not tested)  UPPER BACK STRENGTH:  mid traps  L 5  R 5 , Low traps L 3+  R, Rhomboids  L 4+/5  R  4+/5   FUNCTIONAL TESTS:  Able to rise from standard height chair without UE assist  GAIT: 11/12/22: : 1,303 feet Comments: no assistive device needed  TODAY'S TREATMENT:                                                                                                                              DATE:  12/28/22: Nustep L6 x8  min PT present to discuss progress Standing shoulder extension and rows: green band 2x10 Seated X to V with looking up to encourage thoracic extension 2 x 10 Seated B OH flexion with GTB for ABD tension 1x10; 1x10 with ball Sit to stand with 5# KB 2 x 10 Cable side step 5# 5x right/left (can increase wt); fwd/bwd x 5 ea Leg press: 70# bil 2x10, single leg 30# 2x10 bil each Forward step to over hurdles in // bars 2x10 Standing hip abduction/extension: red band around ankle and attached to post on // bars: 2x10 bil each  12/24/22 Nustep L6 x7 min PT present to discuss progress Standing at wall shoulder horizontal abduction green TB 2x10 reps Standing BUE press downs with green TB 2x10 reps  Seated X to V with looking up to encourage thoracic extension 2 x 10 Seated B OH flexion with GTB for ABD tension 1x10; 1x10 with ball Sit to stand with Black loop around thighs x 10,  then with 5# KB x 10 Cable side step 5# 5x right/left (can increase wt); fwd/bwd x 5 ea UE pull: lat bar pulls 30#:  2 sets of 10 Sidesteping yellow TB around toes 2x10 reps each direction, repeat x2 Standing hip abduction/extension: red band ankle and attached to post on // bars: 2x10 bil each  12/22/22 Nustep L1 x7 min PT present to discuss progress Standing BUE press downs with green TB 2x10 reps  Supine shoulder horizontal abduction red TB 2x10 reps Supine shoulder ER yellow TB 2x10 reps  Sidesteping yellow TB around toes 2x5 reps each direction, repeat x2 Standing hip abduction/extension toe tap hold 3x5 sec hold each side Standing hip flexor stretch with  UE reach at step 2x20 sec   PATIENT EDUCATION:  Education details: Educated patient on anatomy and physiology of current symptoms, prognosis, plan of care as well as initial self care strategies to promote recovery  Person educated: Patient Education method: Explanation Education comprehension: verbalized understanding  HOME EXERCISE PROGRAM: Access Code: [email or web address] URL: [email or web address] Date: 12/22/2022 Prepared by: [facility] - Outpatient Rehab - [facility]  Exercises - Hip Flexor Stretch at [facility] of Bed  - 1 x daily - 7 x weekly - 1 sets - 2-3 reps - 30 hold - Standing Row with Anchored Resistance  - 1 x daily - 7 x weekly - 3 sets - 10 reps - Standing Shoulder Extension with Resistance  - 1 x daily - 7 x weekly - 3 sets - 10 reps - Sit to Stand Without Arm Support  - 1 x daily - 7 x weekly - 2 sets - 10 reps - Bird Dog  - 1 x daily - 7 x weekly - 1 sets - 5 reps - Seated Hamstring Stretch  - 1 x daily - 7 x weekly - 1 sets - 2-3 reps - 20-30 sec hold - Standing March with Counter Support  - 1 x daily - 7 x weekly - 1 sets - 10 reps - Seated Thoracic Extension Arms Overhead  - 1 x daily - 3-4 x weekly - 1-2 sets - 10 reps - 2-3 sec hold - Standing Hip Abduction with Unilateral Counter Support   - 1 x daily - 7 x weekly - 3 sets - 5 reps - 10 seconds hold - Standing Hip Extension with Counter Support  - 1 x daily - 7 x weekly - 3 sets - 10 reps - Single Leg Heel Raise with Counter Support  - 1 x daily - 7 x weekly - 1 sets - 10 reps  ASSESSMENT:  CLINICAL IMPRESSION: Pt reports 85-90% reduction in LBP with functional tasks and standing.  ODI is improved to 24% disability.  Pt reports that she feels unsteady at times when she is in the community. ABC self-confidence is 73% today.   She demonstrates postural dysfunction and is working on improved alignment at home.  She is challenged with hip strength in standing Rt>Lt.  Patient will benefit from skilled PT to address the below impairments and improve overall function.    OBJECTIVE IMPAIRMENTS: decreased activity tolerance, decreased strength, impaired perceived functional ability, and pain.   ACTIVITY LIMITATIONS: carrying, lifting, bending, standing, and locomotion level  PARTICIPATION LIMITATIONS: cleaning, community activity, and yard work  PERSONAL FACTORS: 3+ comorbidities: multi regions affected, fibromyalgia, time since onset , osteoporosis are also affecting patient's functional outcome.   REHAB POTENTIAL: Good  CLINICAL DECISION MAKING: Evolving/moderate complexity  EVALUATION COMPLEXITY: Moderate   GOALS: Goals reviewed with patient? Yes  SHORT TERM GOALS: Target date: 12/01/2022    The patient will demonstrate knowledge of basic self care strategies, osteoporosis education and exercises to promote health Baseline: Goal status: met 6/18 2.  The patient will have improved bil hip strength to at least 4/5 needed for standing, walking longer distances and descending stairs at home and in the community  Baseline:  Goal status: met 6/18  3.  The patient will have improved trunk flexor and extensor muscle strength to at least 4/5 needed for lifting medium weight objects such as grocery bags Baseline:  Goal status:  met 6/18 4.  Patient will demo hip hinge and squat needed for watering her plants  Baseline:  Goal status: met 6/20    LONG TERM GOALS: Target date: 01/29/23    The patient will be independent in a safe self progression of a home exercise program to promote further recovery of function  Baseline:  Goal status: IN PROGRESS  2.  The patient will report a 50% improvement in pain levels with functional activities which are currently difficult including standing and walking for longer periods of time and housework Baseline:  85-90% (12/28/22) Goal status: MET  3.  The patient will have improved trunk flexor and extensor muscle strength to at least 4+/5 needed for lifting medium weight objects such as  luggage for bus trip [place] Baseline:  Goal status: IN PROGRESS  4.  The patient will have improved gait stamina and speed needed to ambulate 800 feet in 6 minutes  Baseline: 1303 (11/12/22) Goal status: MET  5.  Modified Oswestry improved < or = to 10% indicating improved function with less pain Baseline: 24% (12/28/22) Goal status: REVISED  6. Improve ABC to > or = to 81% self confidence for balance  Baseline: 73.125%  Goal Status: NEW    PLAN:  PT FREQUENCY: 2x/week  PT DURATION: 4 weeks  PLANNED INTERVENTIONS: Therapeutic exercises, Therapeutic activity, Neuromuscular re-education, Balance training, Gait training, Patient/Family education, Self Care, Joint mobilization, Aquatic Therapy, Dry Needling, Moist heat, Taping, Manual therapy, and Re-evaluation.  PLAN FOR NEXT SESSION: posture, strength and endurance  continue with weight training including upper back strengthening, balance progression  Lorrene Reid, PT 12/28/22 1:24 PM

## 2022-12-29 ENCOUNTER — Ambulatory Visit: Payer: Medicare HMO | Admitting: Psychiatry

## 2022-12-30 ENCOUNTER — Ambulatory Visit: Payer: Medicare HMO

## 2022-12-30 DIAGNOSIS — M810 Age-related osteoporosis without current pathological fracture: Secondary | ICD-10-CM

## 2022-12-30 DIAGNOSIS — M6281 Muscle weakness (generalized): Secondary | ICD-10-CM | POA: Diagnosis not present

## 2022-12-30 DIAGNOSIS — M5459 Other low back pain: Secondary | ICD-10-CM | POA: Diagnosis not present

## 2022-12-30 DIAGNOSIS — M542 Cervicalgia: Secondary | ICD-10-CM | POA: Diagnosis not present

## 2022-12-30 DIAGNOSIS — R293 Abnormal posture: Secondary | ICD-10-CM | POA: Diagnosis not present

## 2022-12-30 DIAGNOSIS — R2689 Other abnormalities of gait and mobility: Secondary | ICD-10-CM | POA: Diagnosis not present

## 2022-12-30 DIAGNOSIS — R1312 Dysphagia, oropharyngeal phase: Secondary | ICD-10-CM | POA: Diagnosis not present

## 2022-12-30 NOTE — Therapy (Signed)
OUTPATIENT PHYSICAL THERAPY THORACOLUMBAR TREATMENT   Patient Name: Sharon Logan MRN: [id number] DOB:1955-08-01, 67 y.o., female Today's Date: 11/03/2022 Progress Note Reporting Period 11/03/22 to 12/30/22  See note below for Objective Data and Assessment of Progress/Goals.     END OF SESSION:  PT End of Session - 12/30/22 1229     Visit Number 10    Date for PT Re-Evaluation 01/29/23    Authorization Type [employer] 25 total 11/03/22-01/29/23    Authorization - Visit Number 10    Authorization - Number of Visits 24    Progress Note Due on Visit 10    PT Start Time 1148    PT Stop Time 1229    PT Time Calculation (min) 41 min    Activity Tolerance Patient tolerated treatment well    Behavior During Therapy WFL for tasks assessed/performed                  Past Medical History:  Diagnosis Date   Allergic rhinitis    Anxiety    Arthritis    Cancer (HCC)    skin   Chronic pain    Chronically dry eyes    Depression    Fibromyalgia    GERD (gastroesophageal reflux disease)    Headache    Osteoporosis    Sleep apnea    Stroke (HCC)    from gabapenting per pt no deficits   Vitamin D deficiency    Wears glasses    Past Surgical History:  Procedure Laterality Date   ABDOMINAL HYSTERECTOMY     BACK SURGERY     BUNIONECTOMY Bilateral    EYE SURGERY     laser surgery   NASAL SINUS SURGERY     x2   REVERSE SHOULDER ARTHROPLASTY Right 07/03/2022   Procedure: REVERSE SHOULDER ARTHROPLASTY;  Surgeon: Beverely Low, MD;  Location: [facility];  Service: Orthopedics;  Laterality: Right;  120 min choice with interscalene block   SACROILIAC JOINT FUSION Bilateral 09/12/2020   Procedure: REMOVAL OF BILATERAL PELVIC SCREWS;  Surgeon: Estill Bamberg, MD;  Location: [facility];  Service: Orthopedics;  Laterality: Bilateral;   SKIN CANCER EXCISION     face x3   TUBAL LIGATION     Patient Active Problem List   Diagnosis Date Noted   S/P shoulder replacement, right 07/03/2022    Allergic contact dermatitis due to other agents 04/13/2022   S/P lumbar microdiscectomy 04/30/2021   S/P hardware removal 09/12/2020   Radiculopathy 06/29/2019   Cervical radiculopathy 08/15/2018   Chronic sinusitis 07/27/2018   Constipation 07/27/2018   Chronic bilateral low back pain with bilateral sciatica 07/01/2018   Disorder of bone and cartilage 04/04/2018   Esophageal reflux 04/04/2018   Other and unspecified hyperlipidemia 04/04/2018   Other malaise and fatigue 04/04/2018   Pain in joint involving ankle and foot 04/04/2018   Leg pain 04/04/2018   Posttraumatic stress disorder 04/04/2018   Raynaud's phenomenon 04/04/2018   Skin sensation disturbance 04/04/2018   Sleep disturbance 04/04/2018   Vitamin D deficiency 04/04/2018   Myoclonus, segmental 10/18/2017   Adjacent segment disease with spinal stenosis 10/04/2017   DDD (degenerative disc disease), lumbar 10/30/2016   Spondylolisthesis of lumbar region 08/21/2016   Overweight with body mass index (BMI) 25.0-29.9 07/22/2016   Capsulitis of metatarsophalangeal (MTP) joint of right foot 03/23/2016   Lumbar spinal stenosis 12/05/2015   Migraine 09/13/2012   Other allergic rhinitis 09/13/2012   Neuropathy 09/13/2012    PCP: Moshe Cipro NP  REFERRING PROVIDER: Moshe Cipro NP  REFERRING DIAG:  [phone] (ICD-10-CM) - Spinal stenosis, lumbar region without neurogenic claudication  M79.7 (ICD-10-CM) - Fibromyalgia  G89.4 (ICD-10-CM) - Chronic pain syndrome  M81.0 (ICD-10-CM) - Age-related osteoporosis without current pathological fracture    Rationale for Evaluation and Treatment: Rehabilitation  THERAPY DIAG:  Back pain, weakness, osteoporosis  ONSET DATE: 06/15/2022 (exacerbation of chronic problem)  SUBJECTIVE:                                                                                                                                                                                            SUBJECTIVE STATEMENT: I had some upper back pain after last session.  I am going to reach out to my doctor at some point for some imaging.     PERTINENT HISTORY:   spinal cord stimulator next week (May) no twisting, no bending until end of July but sees MD early July; no aquatic PT for 4 months I had an ablation several months ago and I'll never do it again  Reverse TSR. Surgery on 07-03-22   L3-4 andL5-S1 PLIF 2023; fibromyalgia, chronic pain,  OA,  osteoporosis (unsure what areas are impacted) Depression Osteoporosis: T score: -2.9 proximal femur, forearm -1.6 PAIN:  PAIN:  Are you having pain? no NPRS scale: 4/10 Pain location: between the shoulders Aggravating factors: stood too long, walk extra, extra housecleaning (energy depleting) Relieving factors: putting feet up with sitting or lying down for 20 min  PRECAUTIONS: None  WEIGHT BEARING RESTRICTIONS: No  FALLS:  Has patient fallen in last 6 months? No my balance could be better though  LIVING ENVIRONMENT: Lives in: House/apartment Stairs: 1 step to enter   OCCUPATION: retired Enjoys walking, reading, listen to music, tend to plants on deck with hose Has a black lab  PLOF: Independent  PATIENT GOALS: 4 day bus trip in October; I'd like to walk in the [place] and feel mobile   NEXT MD VISIT: every 6 weeks  OBJECTIVE:   DIAGNOSTIC FINDINGS:  Pt will look for Dexa scan results in her records   PATIENT SURVEYS:  Modified Oswestry 30% moderate self perceived disability 12/28/22: Modified Owestry: 24% perceived disability  ABC: 73.125% confidence    COGNITION: Overall cognitive status: Within functional limits for tasks assessed      MUSCLE LENGTH: Dec bil hip flexor lengths: 8 degrees bil of hip extension  POSTURE: decreased lumbar lordosis and increased thoracic kyphosis   LUMBAR ROM:  Uses a squat method to reach floor with ease; unable to lie prone  AROM eval  Flexion WFLs  Extension 5  degrees  Right  lateral flexion   Left lateral flexion   Right rotation   Left rotation    (Blank rows = not tested)  TRUNK STRENGTH:  Decreased activation of transverse abdominus muscles; abdominals 4-/5; decreased activation of lumbar multifidi; trunk extensors 4-/5 6/18:  abdominals and trunk extensors grossly 4/5  LOWER EXTREMITY ROM:   WFLs  LOWER EXTREMITY MMT:  SLS left 3 sec; right SLS 1 sec with pelvic drop;  quadruped bird dog pelvic drop and compensatory rotation right/left Frequent HS cramps during movement testing  MMT Right eval Left eval 6/18  Hip flexion 4 4 4+  Hip extension 4- 4- 4  Hip abduction 3+ 3+ 4-  Hip adduction     Hip internal rotation     Hip external rotation 4- 4- 4  Knee flexion 4 4 4   Knee extension 4 4 4   Ankle dorsiflexion     Ankle plantarflexion     Ankle inversion     Ankle eversion      (Blank rows = not tested)  UPPER BACK STRENGTH:  mid traps  L 5  R 5 , Low traps L 3+  R, Rhomboids  L 4+/5  R  4+/5   FUNCTIONAL TESTS:  Able to rise from standard height chair without UE assist  GAIT: 11/12/22: : 1,303 feet Comments: no assistive device needed  TODAY'S TREATMENT:                                                                                                                              DATE:  12/30/22: Nustep L6 x8  min PT present to discuss progress Standing shoulder extension and rows: green band 2x10 Seated X to V with looking up to encourage thoracic extension 2 x 10 Sit to stand with 5# KB 2 x 10 Sidestepping hurdles at counter top Leg press: 75# bil 2x10, single leg 35# 2x10 bil each Forward step to over hurdles without UE support -SBA by PT for safety Sidestepping at countertop with red band around thighs   12/28/22: Nustep L6 x8  min PT present to discuss progress Standing shoulder extension and rows: green band 2x10 Seated X to V with looking up to encourage thoracic extension 2 x 10 Seated B OH flexion with  GTB for ABD tension 1x10; 1x10 with ball Sit to stand with 5# KB 2 x 10 Cable side step 5# 5x right/left (can increase wt); fwd/bwd x 5 ea Leg press: 70# bil 2x10, single leg 30# 2x10 bil each Forward step to over hurdles in // bars 2x10 Standing hip abduction/extension: red band around ankle and attached to post on // bars: 2x10 bil each  12/24/22 Nustep L6 x7 min PT present to discuss progress Standing at wall shoulder horizontal abduction green TB 2x10 reps Standing BUE press downs with green TB 2x10 reps  Seated X to V with looking up to encourage thoracic extension 2 x 10 Seated B OH flexion with GTB  for ABD tension 1x10; 1x10 with ball Sit to stand with Black loop around thighs x 10, then with 5# KB x 10 Cable side step 5# 5x right/left (can increase wt); fwd/bwd x 5 ea UE pull: lat bar pulls 30#:  2 sets of 10 Sidesteping yellow TB around toes 2x10 reps each direction, repeat x2 Standing hip abduction/extension: red band ankle and attached to post on // bars: 2x10 bil each  12/22/22 Nustep L1 x7 min PT present to discuss progress Standing BUE press downs with green TB 2x10 reps  Supine shoulder horizontal abduction red TB 2x10 reps Supine shoulder ER yellow TB 2x10 reps  Sidesteping yellow TB around toes 2x5 reps each direction, repeat x2 Standing hip abduction/extension toe tap hold 3x5 sec hold each side Standing hip flexor stretch with UE reach at step 2x20 sec   PATIENT EDUCATION:  Education details: Educated patient on anatomy and physiology of current symptoms, prognosis, plan of care as well as initial self care strategies to promote recovery  Person educated: Patient Education method: Explanation Education comprehension: verbalized understanding  HOME EXERCISE PROGRAM: Access Code: [email or web address] URL: [email or web address] Date: 12/30/2022 Prepared by: Tresa Endo  Exercises - Hip Flexor Stretch at Edge of Bed  - 1 x daily - 7 x weekly - 1 sets - 2-3 reps - 30  hold - Standing Row with Anchored Resistance  - 1 x daily - 7 x weekly - 3 sets - 10 reps - Standing Shoulder Extension with Resistance  - 1 x daily - 7 x weekly - 3 sets - 10 reps - Sit to Stand Without Arm Support  - 1 x daily - 7 x weekly - 2 sets - 10 reps - Bird Dog  - 1 x daily - 7 x weekly - 1 sets - 5 reps - Seated Hamstring Stretch  - 1 x daily - 7 x weekly - 1 sets - 2-3 reps - 20-30 sec hold - Standing March with Counter Support  - 1 x daily - 7 x weekly - 1 sets - 10 reps - Seated Thoracic Extension Arms Overhead  - 1 x daily - 3-4 x weekly - 1-2 sets - 10 reps - 2-3 sec hold - Standing Hip Abduction with Unilateral Counter Support  - 1 x daily - 7 x weekly - 3 sets - 5 reps - 10 seconds hold - Standing Hip Extension with Counter Support  - 1 x daily - 7 x weekly - 3 sets - 10 reps - Single Leg Heel Raise with Counter Support  - 1 x daily - 7 x weekly - 1 sets - 10 reps - Forward T with Counter Support  - 1-2 x daily - 7 x weekly - 2 sets - 5 reps - Side Stepping with Resistance at Ankles and Counter Support  - 2 x daily - 7 x weekly - 2 sets - 10 reps  ASSESSMENT:  CLINICAL IMPRESSION: Pt reports 85-90% reduction in LBP with functional tasks and standing.  ODI is improved to 24% disability this week. Pt did well with increased weight on leg press and addition of advanced HEP for glute and single limb stability.  SBA and no UE support required with forward hurdles.  She continues to report upper back and posterior shoulder pain and will call MD to discuss.   Patient will benefit from skilled PT to address the below impairments and improve overall function.    OBJECTIVE IMPAIRMENTS: decreased activity tolerance, decreased strength, impaired perceived functional ability,  and pain.   ACTIVITY LIMITATIONS: carrying, lifting, bending, standing, and locomotion level  PARTICIPATION LIMITATIONS: cleaning, community activity, and yard work  PERSONAL FACTORS: 3+ comorbidities: multi  regions affected, fibromyalgia, time since onset , osteoporosis are also affecting patient's functional outcome.   REHAB POTENTIAL: Good  CLINICAL DECISION MAKING: Evolving/moderate complexity  EVALUATION COMPLEXITY: Moderate   GOALS: Goals reviewed with patient? Yes  SHORT TERM GOALS: Target date: 12/01/2022    The patient will demonstrate knowledge of basic self care strategies, osteoporosis education and exercises to promote health Baseline: Goal status: met 6/18 2.  The patient will have improved bil hip strength to at least 4/5 needed for standing, walking longer distances and descending stairs at home and in the community  Baseline:  Goal status: met 6/18  3.  The patient will have improved trunk flexor and extensor muscle strength to at least 4/5 needed for lifting medium weight objects such as grocery bags Baseline:  Goal status: met 6/18 4.  Patient will demo hip hinge and squat needed for watering her plants  Baseline:  Goal status: met 6/20    LONG TERM GOALS: Target date: 01/29/23    The patient will be independent in a safe self progression of a home exercise program to promote further recovery of function  Baseline:  Goal status: IN PROGRESS  2.  The patient will report a 50% improvement in pain levels with functional activities which are currently difficult including standing and walking for longer periods of time and housework Baseline:  85-90% (12/28/22) Goal status: MET  3.  The patient will have improved trunk flexor and extensor muscle strength to at least 4+/5 needed for lifting medium weight objects such as  luggage for bus trip to [place] Baseline:  Goal status: IN PROGRESS  4.  The patient will have improved gait stamina and speed needed to ambulate 800 feet in 6 minutes  Baseline: 1303 (11/12/22) Goal status: MET  5.  Modified Oswestry improved < or = to 10% indicating improved function with less pain Baseline: 24% (12/28/22) Goal  status: REVISED  6. Improve ABC to > or = to 81% self confidence for balance  Baseline: 73.125%  Goal Status: NEW    PLAN:  PT FREQUENCY: 2x/week  PT DURATION: 4 weeks  PLANNED INTERVENTIONS: Therapeutic exercises, Therapeutic activity, Neuromuscular re-education, Balance training, Gait training, Patient/Family education, Self Care, Joint mobilization, Aquatic Therapy, Dry Needling, Moist heat, Taping, Manual therapy, and Re-evaluation.  PLAN FOR NEXT SESSION: posture, strength and endurance  continue with weight training including upper back strengthening, balance progression  Lorrene Reid, PT 12/30/22 12:32 PM

## 2023-01-04 ENCOUNTER — Ambulatory Visit: Payer: Medicare HMO

## 2023-01-04 DIAGNOSIS — M810 Age-related osteoporosis without current pathological fracture: Secondary | ICD-10-CM

## 2023-01-04 DIAGNOSIS — M6281 Muscle weakness (generalized): Secondary | ICD-10-CM | POA: Diagnosis not present

## 2023-01-04 DIAGNOSIS — M542 Cervicalgia: Secondary | ICD-10-CM | POA: Diagnosis not present

## 2023-01-04 DIAGNOSIS — R1312 Dysphagia, oropharyngeal phase: Secondary | ICD-10-CM | POA: Diagnosis not present

## 2023-01-04 DIAGNOSIS — M5459 Other low back pain: Secondary | ICD-10-CM

## 2023-01-04 DIAGNOSIS — I10 Essential (primary) hypertension: Secondary | ICD-10-CM | POA: Diagnosis not present

## 2023-01-04 DIAGNOSIS — R293 Abnormal posture: Secondary | ICD-10-CM

## 2023-01-04 DIAGNOSIS — R2689 Other abnormalities of gait and mobility: Secondary | ICD-10-CM | POA: Diagnosis not present

## 2023-01-04 NOTE — Therapy (Signed)
OUTPATIENT PHYSICAL THERAPY THORACOLUMBAR TREATMENT   Patient Name: Sharon Logan MRN: [id number] DOB:[birth year], 67 y.o., female Today's Date: 11/03/2022 Progress Note Reporting Period 11/03/22 to 12/30/22  See note below for Objective Data and Assessment of Progress/Goals.     END OF SESSION:  PT End of Session - 01/04/23 1315     Visit Number 11    Date for PT Re-Evaluation 01/29/23    Authorization Type [employer] 25 total 11/03/22-01/29/23    Authorization - Visit Number 11    Authorization - Number of Visits 24    Progress Note Due on Visit 20    PT Start Time 1232    PT Stop Time 1316    PT Time Calculation (min) 44 min    Activity Tolerance Patient tolerated treatment well    Behavior During Therapy WFL for tasks assessed/performed                   Past Medical History:  Diagnosis Date   Allergic rhinitis    Anxiety    Arthritis    Cancer (HCC)    skin   Chronic pain    Chronically dry eyes    Depression    Fibromyalgia    GERD (gastroesophageal reflux disease)    Headache    Osteoporosis    Sleep apnea    Stroke (HCC)    from gabapenting per pt no deficits   Vitamin D deficiency    Wears glasses    Past Surgical History:  Procedure Laterality Date   ABDOMINAL HYSTERECTOMY     BACK SURGERY     BUNIONECTOMY Bilateral    EYE SURGERY     laser surgery   NASAL SINUS SURGERY     x2   REVERSE SHOULDER ARTHROPLASTY Right 07/03/2022   Procedure: REVERSE SHOULDER ARTHROPLASTY;  Surgeon: Beverely Low, MD;  Location: [facility];  Service: Orthopedics;  Laterality: Right;  120 min choice with interscalene block   SACROILIAC JOINT FUSION Bilateral 09/12/2020   Procedure: REMOVAL OF BILATERAL PELVIC SCREWS;  Surgeon: Estill Bamberg, MD;  Location: [facility];  Service: Orthopedics;  Laterality: Bilateral;   SKIN CANCER EXCISION     face x3   TUBAL LIGATION     Patient Active Problem List   Diagnosis Date Noted   S/P shoulder replacement, right  07/03/2022   Allergic contact dermatitis due to other agents 04/13/2022   S/P lumbar microdiscectomy 04/30/2021   S/P hardware removal 09/12/2020   Radiculopathy 06/29/2019   Cervical radiculopathy 08/15/2018   Chronic sinusitis 07/27/2018   Constipation 07/27/2018   Chronic bilateral low back pain with bilateral sciatica 07/01/2018   Disorder of bone and cartilage 04/04/2018   Esophageal reflux 04/04/2018   Other and unspecified hyperlipidemia 04/04/2018   Other malaise and fatigue 04/04/2018   Pain in joint involving ankle and foot 04/04/2018   Leg pain 04/04/2018   Posttraumatic stress disorder 04/04/2018   Raynaud's phenomenon 04/04/2018   Skin sensation disturbance 04/04/2018   Sleep disturbance 04/04/2018   Vitamin D deficiency 04/04/2018   Myoclonus, segmental 10/18/2017   Adjacent segment disease with spinal stenosis 10/04/2017   DDD (degenerative disc disease), lumbar 10/30/2016   Spondylolisthesis of lumbar region 08/21/2016   Overweight with body mass index (BMI) 25.0-29.9 07/22/2016   Capsulitis of metatarsophalangeal (MTP) joint of right foot 03/23/2016   Lumbar spinal stenosis 12/05/2015   Migraine 09/13/2012   Other allergic rhinitis 09/13/2012   Neuropathy 09/13/2012    PCP: Moshe Cipro NP  REFERRING PROVIDER: Moshe Cipro NP  REFERRING DIAG:  [phone] (ICD-10-CM) - Spinal stenosis, lumbar region without neurogenic claudication  M79.7 (ICD-10-CM) - Fibromyalgia  G89.4 (ICD-10-CM) - Chronic pain syndrome  M81.0 (ICD-10-CM) - Age-related osteoporosis without current pathological fracture    Rationale for Evaluation and Treatment: Rehabilitation  THERAPY DIAG:  Back pain, weakness, osteoporosis  ONSET DATE: 06/15/2022 (exacerbation of chronic problem)  SUBJECTIVE:                                                                                                                                                                                            SUBJECTIVE STATEMENT: I saw the doctor regarding my Lt back/shoulder pain.  She wants [place] to try dry needling and massage.  I am getting massages.  I am willing to try DN again, I did it 2 years ago and it didn't help much.     PERTINENT HISTORY:   spinal cord stimulator next week (May) no twisting, no bending until end of July but sees MD early July; no aquatic PT for 4 months I had an ablation several months ago and I'll never do it again  Reverse TSR. Surgery on 07-03-22   L3-4 andL5-S1 PLIF 2023; fibromyalgia, chronic pain,  OA,  osteoporosis (unsure what areas are impacted) Depression Osteoporosis: T score: -2.9 proximal femur, forearm -1.6 PAIN:  PAIN:  Are you having pain? yes NPRS scale: 4-6/10 Pain location: between the shoulders (Lt>Rt) Aggravating factors: stood too long, walk extra, extra housecleaning (energy depleting) Relieving factors: putting feet up with sitting or lying down for 20 min  PRECAUTIONS: None  WEIGHT BEARING RESTRICTIONS: No  FALLS:  Has patient fallen in last 6 months? No my balance could be better though  LIVING ENVIRONMENT: Lives in: House/apartment Stairs: 1 step to enter   OCCUPATION: retired Enjoys walking, reading, listen to music, tend to plants on deck with hose Has a black lab  PLOF: Independent  PATIENT GOALS: 4 day bus trip in October; I'd like to walk in the [place] and feel mobile   NEXT MD VISIT: every 6 weeks  OBJECTIVE:   DIAGNOSTIC FINDINGS:  Pt will look for Dexa scan results in her records   PATIENT SURVEYS:  Modified Oswestry 30% moderate self perceived disability 12/28/22: Modified [place]: 24% perceived disability  ABC: 73.125% confidence    COGNITION: Overall cognitive status: Within functional limits for tasks assessed      MUSCLE LENGTH: Dec bil hip flexor lengths: 8 degrees bil of hip extension  POSTURE: decreased lumbar lordosis and increased thoracic kyphosis   LUMBAR ROM:  Uses a squat  method to reach  floor with ease; unable to lie prone  AROM eval  Flexion WFLs  Extension 5 degrees  Right lateral flexion   Left lateral flexion   Right rotation   Left rotation    (Blank rows = not tested)  TRUNK STRENGTH:  Decreased activation of transverse abdominus muscles; abdominals 4-/5; decreased activation of lumbar multifidi; trunk extensors 4-/5 6/18:  abdominals and trunk extensors grossly 4/5  LOWER EXTREMITY ROM:   WFLs  LOWER EXTREMITY MMT:  SLS left 3 sec; right SLS 1 sec with pelvic drop;  quadruped bird dog pelvic drop and compensatory rotation right/left Frequent HS cramps during movement testing  MMT Right eval Left eval 6/18  Hip flexion 4 4 4+  Hip extension 4- 4- 4  Hip abduction 3+ 3+ 4-  Hip adduction     Hip internal rotation     Hip external rotation 4- 4- 4  Knee flexion 4 4 4   Knee extension 4 4 4   Ankle dorsiflexion     Ankle plantarflexion     Ankle inversion     Ankle eversion      (Blank rows = not tested)  UPPER BACK STRENGTH:  mid traps  L 5  R 5 , Low traps L 3+  R, Rhomboids  L 4+/5  R  4+/5   FUNCTIONAL TESTS:  Able to rise from standard height chair without UE assist  GAIT: 11/12/22: : 1,303 feet Comments: no assistive device needed  TODAY'S TREATMENT:                                                                                                                              DATE:  01/04/23: Nustep L6 x10  min PT present to discuss progress Seated X to V with looking up to encourage thoracic extension 2 x 10 Sidestepping hurdles at counter top Leg press: 75# bil 2x15, single leg 35# 2x10 bil each Forward step to over hurdles without UE support -SBA by PT for safety Trigger Point Dry-Needling  Treatment instructions: Expect mild to moderate muscle soreness. S/S of pneumothorax if dry needled over a lung field, and to seek immediate medical attention should they occur. Patient verbalized understanding of these instructions  and education.  Patient Consent Given: Yes Education handout provided: Yes Muscles treated: Lt thoracic multifidi and rhomboid, subscapularis Treatment response/outcome: Utilized skilled palpation to identify trigger points.  During dry needling able to palpate muscle twitch and muscle elongation  Elongation and release to  Skilled palpation and monitoring by PT during dry needling  12/30/22: Nustep L6 x8  min PT present to discuss progress Standing shoulder extension and rows: green band 2x10 Seated X to V with looking up to encourage thoracic extension 2 x 10 Sit to stand with 5# KB 2 x 10 Sidestepping hurdles at counter top Leg press: 75# bil 2x10, single leg 35# 2x10 bil each Forward step to over hurdles without UE support -SBA by PT for safety  Sidestepping at countertop with red band around thighs   12/28/22: Nustep L6 x8  min PT present to discuss progress Standing shoulder extension and rows: green band 2x10 Seated X to V with looking up to encourage thoracic extension 2 x 10 Seated B OH flexion with GTB for ABD tension 1x10; 1x10 with ball Sit to stand with 5# KB 2 x 10 Cable side step 5# 5x right/left (can increase wt); fwd/bwd x 5 ea Leg press: 70# bil 2x10, single leg 30# 2x10 bil each Forward step to over hurdles in // bars 2x10 Standing hip abduction/extension: red band around ankle and attached to post on // bars: 2x10 bil each  12/24/22 Nustep L6 x7 min PT present to discuss progress Standing at wall shoulder horizontal abduction green TB 2x10 reps Standing BUE press downs with green TB 2x10 reps  Seated X to V with looking up to encourage thoracic extension 2 x 10 Seated B OH flexion with GTB for ABD tension 1x10; 1x10 with ball Sit to stand with Black loop around thighs x 10, then with 5# KB x 10 Cable side step 5# 5x right/left (can increase wt); fwd/bwd x 5 ea UE pull: lat bar pulls 30#:  2 sets of 10 Sidesteping yellow TB around toes 2x10 reps each direction,  repeat x2 Standing hip abduction/extension: red band ankle and attached to post on // bars: 2x10 bil each   PATIENT EDUCATION:  Education details: Educated patient on anatomy and physiology of current symptoms, prognosis, plan of care as well as initial self care strategies to promote recovery  Person educated: Patient Education method: Explanation Education comprehension: verbalized understanding  HOME EXERCISE PROGRAM: Access Code: [email or web address] URL: [email or web address] Date: 12/30/2022 Prepared by: Tresa Endo  Exercises - Hip Flexor Stretch at Edge of Bed  - 1 x daily - 7 x weekly - 1 sets - 2-3 reps - 30 hold - Standing Row with Anchored Resistance  - 1 x daily - 7 x weekly - 3 sets - 10 reps - Standing Shoulder Extension with Resistance  - 1 x daily - 7 x weekly - 3 sets - 10 reps - Sit to Stand Without Arm Support  - 1 x daily - 7 x weekly - 2 sets - 10 reps - Bird Dog  - 1 x daily - 7 x weekly - 1 sets - 5 reps - Seated Hamstring Stretch  - 1 x daily - 7 x weekly - 1 sets - 2-3 reps - 20-30 sec hold - Standing March with Counter Support  - 1 x daily - 7 x weekly - 1 sets - 10 reps - Seated Thoracic Extension Arms Overhead  - 1 x daily - 3-4 x weekly - 1-2 sets - 10 reps - 2-3 sec hold - Standing Hip Abduction with Unilateral Counter Support  - 1 x daily - 7 x weekly - 3 sets - 5 reps - 10 seconds hold - Standing Hip Extension with Counter Support  - 1 x daily - 7 x weekly - 3 sets - 10 reps - Single Leg Heel Raise with Counter Support  - 1 x daily - 7 x weekly - 1 sets - 10 reps - Forward T with Counter Support  - 1-2 x daily - 7 x weekly - 2 sets - 5 reps - Side Stepping with Resistance at Ankles and Counter Support  - 2 x daily - 7 x weekly - 2 sets - 10 reps  ASSESSMENT:  CLINICAL IMPRESSION: Pt reports  85-90% reduction in LBP with functional tasks and standing.  Pt saw her MD to discuss ongoing Lt upper back and shoulder pain and MD wanted her to try DN.  Pt was  agreeable and had good response with twitch and improved tissue mobility in the Lt upper quarter.   SBA and no UE support required with forward hurdles. Patient will benefit from skilled PT to address the below impairments and improve overall function.    OBJECTIVE IMPAIRMENTS: decreased activity tolerance, decreased strength, impaired perceived functional ability, and pain.   ACTIVITY LIMITATIONS: carrying, lifting, bending, standing, and locomotion level  PARTICIPATION LIMITATIONS: cleaning, community activity, and yard work  PERSONAL FACTORS: 3+ comorbidities: multi regions affected, fibromyalgia, time since onset , osteoporosis are also affecting patient's functional outcome.   REHAB POTENTIAL: Good  CLINICAL DECISION MAKING: Evolving/moderate complexity  EVALUATION COMPLEXITY: Moderate   GOALS: Goals reviewed with patient? Yes  SHORT TERM GOALS: Target date: 12/01/2022    The patient will demonstrate knowledge of basic self care strategies, osteoporosis education and exercises to promote health Baseline: Goal status: met 6/18 2.  The patient will have improved bil hip strength to at least 4/5 needed for standing, walking longer distances and descending stairs at home and in the community  Baseline:  Goal status: met 6/18  3.  The patient will have improved trunk flexor and extensor muscle strength to at least 4/5 needed for lifting medium weight objects such as grocery bags Baseline:  Goal status: met 6/18 4.  Patient will demo hip hinge and squat needed for watering her plants  Baseline:  Goal status: met 6/20    LONG TERM GOALS: Target date: 01/29/23    The patient will be independent in a safe self progression of a home exercise program to promote further recovery of function  Baseline:  Goal status: IN PROGRESS  2.  The patient will report a 50% improvement in pain levels with functional activities which are currently difficult including standing and walking  for longer periods of time and housework Baseline:  85-90% (12/28/22) Goal status: MET  3.  The patient will have improved trunk flexor and extensor muscle strength to at least 4+/5 needed for lifting medium weight objects such as  luggage for bus trip to [place] Baseline:  Goal status: IN PROGRESS  4.  The patient will have improved gait stamina and speed needed to ambulate 800 feet in 6 minutes  Baseline: 1303 (11/12/22) Goal status: MET  5.  Modified Oswestry improved < or = to 10% indicating improved function with less pain Baseline: 24% (12/28/22) Goal status: REVISED  6. Improve ABC to > or = to 81% self confidence for balance  Baseline: 73.125%  Goal Status: NEW    PLAN:  PT FREQUENCY: 2x/week  PT DURATION: 4 weeks  PLANNED INTERVENTIONS: Therapeutic exercises, Therapeutic activity, Neuromuscular re-education, Balance training, Gait training, Patient/Family education, Self Care, Joint mobilization, Aquatic Therapy, Dry Needling, Moist heat, Taping, Manual therapy, and Re-evaluation.  PLAN FOR NEXT SESSION: See how pt responded to DN, posture, strength and endurance  continue with weight training including upper back strengthening, balance progression  Lorrene Reid, PT 01/04/23 1:19 PM

## 2023-01-05 DIAGNOSIS — H25043 Posterior subcapsular polar age-related cataract, bilateral: Secondary | ICD-10-CM | POA: Diagnosis not present

## 2023-01-05 DIAGNOSIS — H25013 Cortical age-related cataract, bilateral: Secondary | ICD-10-CM | POA: Diagnosis not present

## 2023-01-05 DIAGNOSIS — H2512 Age-related nuclear cataract, left eye: Secondary | ICD-10-CM | POA: Diagnosis not present

## 2023-01-05 DIAGNOSIS — H18413 Arcus senilis, bilateral: Secondary | ICD-10-CM | POA: Diagnosis not present

## 2023-01-05 DIAGNOSIS — H2513 Age-related nuclear cataract, bilateral: Secondary | ICD-10-CM | POA: Diagnosis not present

## 2023-01-06 ENCOUNTER — Ambulatory Visit (INDEPENDENT_AMBULATORY_CARE_PROVIDER_SITE_OTHER): Payer: Medicare HMO | Admitting: Podiatry

## 2023-01-06 ENCOUNTER — Encounter: Payer: Self-pay | Admitting: Podiatry

## 2023-01-06 DIAGNOSIS — G5761 Lesion of plantar nerve, right lower limb: Secondary | ICD-10-CM

## 2023-01-06 DIAGNOSIS — M79672 Pain in left foot: Secondary | ICD-10-CM

## 2023-01-06 DIAGNOSIS — G5762 Lesion of plantar nerve, left lower limb: Secondary | ICD-10-CM | POA: Diagnosis not present

## 2023-01-06 DIAGNOSIS — M79671 Pain in right foot: Secondary | ICD-10-CM | POA: Diagnosis not present

## 2023-01-06 MED ORDER — DEXAMETHASONE SODIUM PHOSPHATE 120 MG/30ML IJ SOLN
4.0000 mg | Freq: Once | INTRAMUSCULAR | Status: AC
Start: 2023-01-06 — End: 2023-01-06
  Administered 2023-01-06: 4 mg via INTRA_ARTICULAR

## 2023-01-06 MED ORDER — TRIAMCINOLONE ACETONIDE 10 MG/ML IJ SUSP
2.5000 mg | Freq: Once | INTRAMUSCULAR | Status: AC
Start: 2023-01-06 — End: 2023-01-06
  Administered 2023-01-06: 2.5 mg via INTRA_ARTICULAR

## 2023-01-06 NOTE — Progress Notes (Signed)
  Subjective:  Patient ID: Sharon Logan, female    DOB: [birth year],   MRN: [id number]  Chief Complaint  Patient presents with   Foot Pain    Pt stated that both of her feet are usually in pain but mostly the right one on the top and its all the time she said.    67 y.o. female presents for concern of pain in the ball of both of her feet. Relates it is numb but painful. She does have a history of nerve stimulator in her back that has improved the nerve pain but still getting the aches. Deneis any current treatments  . Denies any other pedal complaints. Denies n/v/f/c.   Past Medical History:  Diagnosis Date   Allergic rhinitis    Anxiety    Arthritis    Cancer (HCC)    skin   Chronic pain    Chronically dry eyes    Depression    Fibromyalgia    GERD (gastroesophageal reflux disease)    Headache    Osteoporosis    Sleep apnea    Stroke (HCC)    from gabapenting per pt no deficits   Vitamin D deficiency    Wears glasses     Objective:  Physical Exam: Vascular: DP/PT pulses 2/4 bilateral. CFT <3 seconds. Normal hair growth on digits. No edema.  Skin. No lacerations or abrasions bilateral feet.  Musculoskeletal: MMT 5/5 bilateral lower extremities in DF, PF, Inversion and Eversion. Deceased ROM in DF of ankle joint. Tender to third space bilateral but more so on the right. Pain to palpation along the bottom of the foot and pain with metatarsal squeeze. Positive mulders click on the right.  Neurological: Sensation intact to light touch.   Assessment:   1. Morton's neuroma, right   2. Morton's neuroma, left      Plan:  Patient was evaluated and treated and all questions answered. Discussed neuroma and treatment options with patient.  Radiographs reviewed and discussed with patient.  Injection offered today. Patient in agreement. Procedure below.  Discussed padding and offloading today.  Prescription for meloxicam provided.Discussed if pain does not improve may consider   MRI for further surgical planning.  Patient to return in 6 weeks or sooner if concerns arise.   Procedure: Injection Tendon/Ligament Discussed alternatives, risks, complications and verbal consent was obtained.  Location: Right neuroma. Skin Prep: Alcohol. Injectate: 1cc 0.5% marcaine plain, 1 cc dexamethasone 0.5 cc kenalog  Disposition: Patient tolerated procedure well. Injection site dressed with a band-aid.  Post-injection care was discussed and return precautions discussed.      Louann Sjogren, DPM

## 2023-01-08 ENCOUNTER — Ambulatory Visit: Payer: Medicare HMO | Admitting: Physical Therapy

## 2023-01-08 ENCOUNTER — Encounter: Payer: Self-pay | Admitting: Physical Therapy

## 2023-01-08 ENCOUNTER — Other Ambulatory Visit (HOSPITAL_COMMUNITY): Payer: Self-pay | Admitting: Physician Assistant

## 2023-01-08 DIAGNOSIS — R293 Abnormal posture: Secondary | ICD-10-CM | POA: Diagnosis not present

## 2023-01-08 DIAGNOSIS — R1312 Dysphagia, oropharyngeal phase: Secondary | ICD-10-CM | POA: Diagnosis not present

## 2023-01-08 DIAGNOSIS — M6281 Muscle weakness (generalized): Secondary | ICD-10-CM | POA: Diagnosis not present

## 2023-01-08 DIAGNOSIS — M5459 Other low back pain: Secondary | ICD-10-CM | POA: Diagnosis not present

## 2023-01-08 DIAGNOSIS — M810 Age-related osteoporosis without current pathological fracture: Secondary | ICD-10-CM

## 2023-01-08 DIAGNOSIS — M542 Cervicalgia: Secondary | ICD-10-CM

## 2023-01-08 DIAGNOSIS — R2689 Other abnormalities of gait and mobility: Secondary | ICD-10-CM | POA: Diagnosis not present

## 2023-01-08 NOTE — Therapy (Signed)
OUTPATIENT PHYSICAL THERAPY THORACOLUMBAR TREATMENT   Patient Name: Sharon Logan MRN: [id number] DOB:1956-05-06, 67 y.o., female Today's Date: 11/03/2022     END OF SESSION:  PT End of Session - 01/08/23 1020     Visit Number 12    Number of Visits 16    Date for PT Re-Evaluation 01/29/23    Authorization Type [employer] 25 total 11/03/22-01/29/23    Authorization - Number of Visits 24    Progress Note Due on Visit 20    PT Start Time 1016    PT Stop Time 1100    PT Time Calculation (min) 44 min    Activity Tolerance Patient tolerated treatment well    Behavior During Therapy WFL for tasks assessed/performed                    Past Medical History:  Diagnosis Date   Allergic rhinitis    Anxiety    Arthritis    Cancer (HCC)    skin   Chronic pain    Chronically dry eyes    Depression    Fibromyalgia    GERD (gastroesophageal reflux disease)    Headache    Osteoporosis    Sleep apnea    Stroke (HCC)    from gabapenting per pt no deficits   Vitamin D deficiency    Wears glasses    Past Surgical History:  Procedure Laterality Date   ABDOMINAL HYSTERECTOMY     BACK SURGERY     BUNIONECTOMY Bilateral    EYE SURGERY     laser surgery   NASAL SINUS SURGERY     x2   REVERSE SHOULDER ARTHROPLASTY Right 07/03/2022   Procedure: REVERSE SHOULDER ARTHROPLASTY;  Surgeon: Beverely Low, MD;  Location: [facility];  Service: Orthopedics;  Laterality: Right;  120 min choice with interscalene block   SACROILIAC JOINT FUSION Bilateral 09/12/2020   Procedure: REMOVAL OF BILATERAL PELVIC SCREWS;  Surgeon: Estill Bamberg, MD;  Location: [facility];  Service: Orthopedics;  Laterality: Bilateral;   SKIN CANCER EXCISION     face x3   TUBAL LIGATION     Patient Active Problem List   Diagnosis Date Noted   S/P shoulder replacement, right 07/03/2022   Allergic contact dermatitis due to other agents 04/13/2022   S/P lumbar microdiscectomy 04/30/2021   S/P hardware removal  09/12/2020   Radiculopathy 06/29/2019   Cervical radiculopathy 08/15/2018   Chronic sinusitis 07/27/2018   Constipation 07/27/2018   Chronic bilateral low back pain with bilateral sciatica 07/01/2018   Disorder of bone and cartilage 04/04/2018   Esophageal reflux 04/04/2018   Other and unspecified hyperlipidemia 04/04/2018   Other malaise and fatigue 04/04/2018   Pain in joint involving ankle and foot 04/04/2018   Leg pain 04/04/2018   Posttraumatic stress disorder 04/04/2018   Raynaud's phenomenon 04/04/2018   Skin sensation disturbance 04/04/2018   Sleep disturbance 04/04/2018   Vitamin D deficiency 04/04/2018   Myoclonus, segmental 10/18/2017   Adjacent segment disease with spinal stenosis 10/04/2017   DDD (degenerative disc disease), lumbar 10/30/2016   Spondylolisthesis of lumbar region 08/21/2016   Overweight with body mass index (BMI) 25.0-29.9 07/22/2016   Capsulitis of metatarsophalangeal (MTP) joint of right foot 03/23/2016   Lumbar spinal stenosis 12/05/2015   Migraine 09/13/2012   Other allergic rhinitis 09/13/2012   Neuropathy 09/13/2012    PCP: Moshe Cipro NP  REFERRING PROVIDER: Moshe Cipro NP  REFERRING DIAG:  [phone] (ICD-10-CM) - Spinal stenosis, lumbar region without  neurogenic claudication  M79.7 (ICD-10-CM) - Fibromyalgia  G89.4 (ICD-10-CM) - Chronic pain syndrome  M81.0 (ICD-10-CM) - Age-related osteoporosis without current pathological fracture    Rationale for Evaluation and Treatment: Rehabilitation  THERAPY DIAG:  Back pain, weakness, osteoporosis  ONSET DATE: 06/15/2022 (exacerbation of chronic problem)  SUBJECTIVE:                                                                                                                                                                                           SUBJECTIVE STATEMENT: The DN helped for the rest of that day but it came back.  I am waiting to be scheduled for an MRI for my  neck to see if I have a pinched nerve.  I am going to go to the [facility] later today to do a [employer] class.  It has been 2 years since I tried one.  I haven't had energy to do it.    PERTINENT HISTORY:   spinal cord stimulator next week (May) no twisting, no bending until end of July but sees MD early July; no aquatic PT for 4 months I had an ablation several months ago and I'll never do it again  Reverse TSR. Surgery on 07-03-22   L3-4 andL5-S1 PLIF 2023; fibromyalgia, chronic pain,  OA,  osteoporosis (unsure what areas are impacted) Depression Osteoporosis: T score: -2.9 proximal femur, forearm -1.6 PAIN:  PAIN:  Are you having pain? yes NPRS scale: 4-6/10 Pain location: between the shoulders (Lt>Rt) Aggravating factors: stood too long, walk extra, extra housecleaning (energy depleting) Relieving factors: putting feet up with sitting or lying down for 20 min  PRECAUTIONS: None  WEIGHT BEARING RESTRICTIONS: No  FALLS:  Has patient fallen in last 6 months? No my balance could be better though  LIVING ENVIRONMENT: Lives in: House/apartment Stairs: 1 step to enter   OCCUPATION: retired Enjoys walking, reading, listen to music, tend to plants on deck with hose Has a black lab  PLOF: Independent  PATIENT GOALS: 4 day bus trip in October; I'd like to walk in the [place] and feel mobile   NEXT MD VISIT: every 6 weeks  OBJECTIVE:   DIAGNOSTIC FINDINGS:  Pt will look for Dexa scan results in her records   PATIENT SURVEYS:  Modified Oswestry 30% moderate self perceived disability 12/28/22: Modified O[place]: 24% perceived disability  ABC: 73.125% confidence    COGNITION: Overall cognitive status: Within functional limits for tasks assessed      MUSCLE LENGTH: Dec bil hip flexor lengths: 8 degrees bil of hip extension  POSTURE: decreased lumbar lordosis and increased thoracic kyphosis   LUMBAR  ROM:  Uses a squat method to reach floor with ease; unable  to lie prone  AROM eval  Flexion WFLs  Extension 5 degrees  Right lateral flexion   Left lateral flexion   Right rotation   Left rotation    (Blank rows = not tested)  TRUNK STRENGTH:  Decreased activation of transverse abdominus muscles; abdominals 4-/5; decreased activation of lumbar multifidi; trunk extensors 4-/5 6/18:  abdominals and trunk extensors grossly 4/5  LOWER EXTREMITY ROM:   WFLs  LOWER EXTREMITY MMT:  SLS left 3 sec; right SLS 1 sec with pelvic drop;  quadruped bird dog pelvic drop and compensatory rotation right/left Frequent HS cramps during movement testing  MMT Right eval Left eval 6/18  Hip flexion 4 4 4+  Hip extension 4- 4- 4  Hip abduction 3+ 3+ 4-  Hip adduction     Hip internal rotation     Hip external rotation 4- 4- 4  Knee flexion 4 4 4   Knee extension 4 4 4   Ankle dorsiflexion     Ankle plantarflexion     Ankle inversion     Ankle eversion      (Blank rows = not tested)  UPPER BACK STRENGTH:  mid traps  L 5  R 5 , Low traps L 3+  R, Rhomboids  L 4+/5  R  4+/5   FUNCTIONAL TESTS:  Able to rise from standard height chair without UE assist  GAIT: 11/12/22: : 1,303 feet Comments: no assistive device needed  TODAY'S TREATMENT:                                                                                                                              DATE:  7/26: NuStep L5 x 10' PT present to discuss status Hurdles with single UE support in parallel bars 3 passes fwd and sidestepping each Supine hooklying neck retraction with towel roll under neck 5x10" Supine green UE band x10 each: horiz abduction, ER, D2 flexion Standing bil shoulder extension and row 2x10 each Leg press: 75# bil 2x15, single leg 35# 2x10 bil each Seated B OH 2x10 with ball Sidestepping at barre with red band around thighs x 3 laps - no UE assist  01/04/23: Nustep L6 x10  min PT present to discuss progress Seated X to V with looking up to encourage thoracic  extension 2 x 10 Sidestepping hurdles at counter top Leg press: 75# bil 2x15, single leg 35# 2x10 bil each Forward step to over hurdles without UE support -SBA by PT for safety Trigger Point Dry-Needling  Treatment instructions: Expect mild to moderate muscle soreness. S/S of pneumothorax if dry needled over a lung field, and to seek immediate medical attention should they occur. Patient verbalized understanding of these instructions and education.  Patient Consent Given: Yes Education handout provided: Yes Muscles treated: Lt thoracic multifidi and rhomboid, subscapularis Treatment response/outcome: Utilized skilled palpation to identify trigger points.  During dry needling  able to palpate muscle twitch and muscle elongation  Elongation and release to  Skilled palpation and monitoring by PT during dry needling  12/30/22: Nustep L6 x8  min PT present to discuss progress Standing shoulder extension and rows: green band 2x10 Seated X to V with looking up to encourage thoracic extension 2 x 10 Sit to stand with 5# KB 2 x 10 Sidestepping hurdles at counter top Leg press: 75# bil 2x10, single leg 35# 2x10 bil each Forward step to over hurdles without UE support -SBA by PT for safety Sidestepping at countertop with red band around thighs   12/28/22: Nustep L6 x8  min PT present to discuss progress Standing shoulder extension and rows: green band 2x10 Seated X to V with looking up to encourage thoracic extension 2 x 10 Seated B OH flexion with GTB for ABD tension 1x10; 1x10 with ball Sit to stand with 5# KB 2 x 10 Cable side step 5# 5x right/left (can increase wt); fwd/bwd x 5 ea Leg press: 70# bil 2x10, single leg 30# 2x10 bil each Forward step to over hurdles in // bars 2x10 Standing hip abduction/extension: red band around ankle and attached to post on // bars: 2x10 bil each    PATIENT EDUCATION:  Education details: Educated patient on anatomy and physiology of current symptoms,  prognosis, plan of care as well as initial self care strategies to promote recovery  Person educated: Patient Education method: Explanation Education comprehension: verbalized understanding  HOME EXERCISE PROGRAM: Access Code: [email or web address] URL: [email or web address] Date: 01/08/2023 Prepared by: Loistine Simas Dominiq Fontaine  Exercises - Hip Flexor Stretch at Edge of Bed  - 1 x daily - 7 x weekly - 1 sets - 2-3 reps - 30 hold - Standing Row with Anchored Resistance  - 1 x daily - 7 x weekly - 3 sets - 10 reps - Standing Shoulder Extension with Resistance  - 1 x daily - 7 x weekly - 3 sets - 10 reps - Sit to Stand Without Arm Support  - 1 x daily - 7 x weekly - 2 sets - 10 reps - Bird Dog  - 1 x daily - 7 x weekly - 1 sets - 5 reps - Seated Hamstring Stretch  - 1 x daily - 7 x weekly - 1 sets - 2-3 reps - 20-30 sec hold - Standing March with Counter Support  - 1 x daily - 7 x weekly - 1 sets - 10 reps - Seated Thoracic Extension Arms Overhead  - 1 x daily - 3-4 x weekly - 1-2 sets - 10 reps - 2-3 sec hold - Standing Hip Abduction with Unilateral Counter Support  - 1 x daily - 7 x weekly - 3 sets - 5 reps - 10 seconds hold - Standing Hip Extension with Counter Support  - 1 x daily - 7 x weekly - 3 sets - 10 reps - Single Leg Heel Raise with Counter Support  - 1 x daily - 7 x weekly - 1 sets - 10 reps - Forward T with Counter Support  - 1-2 x daily - 7 x weekly - 2 sets - 5 reps - Side Stepping with Resistance at Ankles and Counter Support  - 2 x daily - 7 x weekly - 2 sets - 10 reps - Supine Cervical Retraction with Towel  - 1 x daily - 7 x weekly - 1 sets - 5 reps - 10 hold - Supine Shoulder Horizontal Abduction with Resistance  - 1 x  daily - 7 x weekly - 1 sets - 10 reps - Supine Shoulder External Rotation with Resistance  - 1 x daily - 7 x weekly - 1 sets - 10 reps - Supine PNF D2 Flexion with Resistance  - 1 x daily - 7 x weekly - 1 sets - 10 reps - Standing Row with Anchored Resistance   - 1 x daily - 7 x weekly - 1 sets - 10 reps - Standing Shoulder Extension with Resistance  - 1 x daily - 7 x weekly - 1 sets - 10 reps  ASSESSMENT:  CLINICAL IMPRESSION: Pt reports 85-90% reduction in LBP with functional tasks and standing.  Pt reported relief with DN for the rest of the day with return of symptoms the next morning.  She is awaiting MRI scheduling for cervical spine.  PT progressed neck stabilization and UE postural strength with updates to HEP using green tband.  Pt able to do hurdles and sidestepping with resistance today with less or no UE assistance demo'ing improving balance and stability.   OBJECTIVE IMPAIRMENTS: decreased activity tolerance, decreased strength, impaired perceived functional ability, and pain.   ACTIVITY LIMITATIONS: carrying, lifting, bending, standing, and locomotion level  PARTICIPATION LIMITATIONS: cleaning, community activity, and yard work  PERSONAL FACTORS: 3+ comorbidities: multi regions affected, fibromyalgia, time since onset , osteoporosis are also affecting patient's functional outcome.   REHAB POTENTIAL: Good  CLINICAL DECISION MAKING: Evolving/moderate complexity  EVALUATION COMPLEXITY: Moderate   GOALS: Goals reviewed with patient? Yes  SHORT TERM GOALS: Target date: 12/01/2022    The patient will demonstrate knowledge of basic self care strategies, osteoporosis education and exercises to promote health Baseline: Goal status: met 6/18 2.  The patient will have improved bil hip strength to at least 4/5 needed for standing, walking longer distances and descending stairs at home and in the community  Baseline:  Goal status: met 6/18  3.  The patient will have improved trunk flexor and extensor muscle strength to at least 4/5 needed for lifting medium weight objects such as grocery bags Baseline:  Goal status: met 6/18 4.  Patient will demo hip hinge and squat needed for watering her plants  Baseline:  Goal status: met  6/20    LONG TERM GOALS: Target date: 01/29/23    The patient will be independent in a safe self progression of a home exercise program to promote further recovery of function  Baseline:  Goal status: IN PROGRESS  2.  The patient will report a 50% improvement in pain levels with functional activities which are currently difficult including standing and walking for longer periods of time and housework Baseline:  85-90% (12/28/22) Goal status: MET  3.  The patient will have improved trunk flexor and extensor muscle strength to at least 4+/5 needed for lifting medium weight objects such as  luggage for bus trip to [place] Baseline:  Goal status: IN PROGRESS  4.  The patient will have improved gait stamina and speed needed to ambulate 800 feet in 6 minutes  Baseline: 1303 (11/12/22) Goal status: MET  5.  Modified Oswestry improved < or = to 10% indicating improved function with less pain Baseline: 24% (12/28/22) Goal status: REVISED  6. Improve ABC to > or = to 81% self confidence for balance  Baseline: 73.125%  Goal Status: NEW    PLAN:  PT FREQUENCY: 2x/week  PT DURATION: 4 weeks  PLANNED INTERVENTIONS: Therapeutic exercises, Therapeutic activity, Neuromuscular re-education, Balance training, Gait training, Patient/Family education,  Self Care, Joint mobilization, Aquatic Therapy, Dry Needling, Moist heat, Taping, Manual therapy, and Re-evaluation.  PLAN FOR NEXT SESSION: See how pt responded to DN, posture, strength and endurance  continue with weight training including upper back strengthening, balance progression  Teara Duerksen, PT 01/08/23 11:01 AM

## 2023-01-11 ENCOUNTER — Ambulatory Visit (INDEPENDENT_AMBULATORY_CARE_PROVIDER_SITE_OTHER): Payer: No Typology Code available for payment source | Admitting: Psychiatry

## 2023-01-11 ENCOUNTER — Encounter: Payer: Self-pay | Admitting: Psychiatry

## 2023-01-11 DIAGNOSIS — F4310 Post-traumatic stress disorder, unspecified: Secondary | ICD-10-CM

## 2023-01-11 DIAGNOSIS — F339 Major depressive disorder, recurrent, unspecified: Secondary | ICD-10-CM | POA: Diagnosis not present

## 2023-01-11 DIAGNOSIS — G4700 Insomnia, unspecified: Secondary | ICD-10-CM | POA: Diagnosis not present

## 2023-01-11 MED ORDER — BELSOMRA 10 MG PO TABS
10.0000 mg | ORAL_TABLET | Freq: Every day | ORAL | Status: DC
Start: 2023-01-11 — End: 2023-01-21

## 2023-01-11 MED ORDER — BELSOMRA 20 MG PO TABS
20.0000 mg | ORAL_TABLET | Freq: Every day | ORAL | Status: DC
Start: 2023-01-11 — End: 2023-01-21

## 2023-01-11 NOTE — Progress Notes (Unsigned)
Sharon Logan [id number] January 03, 1956 67 y.o.  Subjective:   Patient ID:  Sharon Logan is a 67 y.o. (DOB 29-Oct-1955) female.  Chief Complaint: No chief complaint on file.   HPI Sharon Logan presents to the office today for follow-up of *** She reports, "I want to come off the Duloxetine and I want to come off the Remeron... the Duloxetine was making me extremely drugged." She reports that she stopped Duloxetine about 5 days ago and now feels more alert. She reports some depression currently, "but it's not heavy." Denies anxiety. She reports that she is not sleeping well and has trouble staying asleep. Falls asleep without difficulty and then awake in 4-5 hours. Denies nightmares or flashbacks. Suspects chronic pain plays a role with sleep disturbance. She reports that her energy and motivation are low- "the desire to do things is there" and that her energy is gone before mid-day. Appetite has been good. Denies any forgetfulness recently. Concentration has improved. Denies SI.   Has been taking Mirtazapine 1/2 tab some nights. She feels that Mirtazapine is no longer as helpful as it was initially.   Past Medication Trials: Cymbalta- Started after back surgery. Has made her tired, even when reduced to 30 mg. Was having to take 2 hour naps. May have helped slightly with pain. Had HA, Constipation, Nausea Savella- muscle aches, rhabdomyolysis Effexor-HA, helped with excessive sweating.  Pristiq- May have increased depression. Prozac- Constipation. Causes her to feel cold.  Celexa- Worsening depression at 20 mg dose Lexapro- "My anti-depressant of choice" and gives her energy. Was taking 2-3 years prior to surgery. Re-started one week ago. Has taken 10 mg and 20 mg doses in the past. Thinks it may have helped with pain in the past. Zoloft- worsening mood Paxil Viibryd- Felt depression was more "heavy." Wellbutrin- Did not cause HA's. Tolerated well and was effective for depression. Auvelity-  "felt drugged" and was forgetting appointments.  Amitriptyline- Did not cause HA's. Weight gain. Effective.  Nortriptyline- Did not cause HA's. Effective. Lamictal- Rash Gabapentin -Rhabomyolysis Lyrica- Adverse reaction Buspar- Helpful for anxiety in the past. Well-tolerated. Tried again and had severe anxiety.  Trazodone- helped with sleep initiation but sleep was not restful. Severe dry mouth. Ambien- Parasomnia Lunesta- Tolerated, effective at 2 mg dose Remeron- Dry eyes, internal restlessness. Stomach discomfort.  Clonidine Topamax- ineffective for headaches. Caused cognitive side effects.  Hydroxyzine- Initially helped with anxiety and sleep initiation, then no longer as helpful    PHQ2-9    Flowsheet Row Nutrition from 10/26/2022 in [facility] at [facility] Visit from 09/28/2022 in [facility] Office Visit from 07/27/2022 in [facility] Medicine & Rehabilitation Office Visit from 05/25/2022 in [facility] Rehabilitation Office Visit from 10/06/2021 in [facility] Physical Medicine & Rehabilitation  PHQ-2 Total Score 0 0 2 0 0      Flowsheet Row Admission (Discharged) from 07/03/2022 in [facility] Pre-Admission Testing 60 from 06/18/2022 in [facility] ED from 10/12/2021 in [facility] at [facility]  C-SSRS RISK CATEGORY No Risk No Risk No Risk        Review of Systems:  Review of Systems  Gastrointestinal:  Positive for abdominal pain.  Musculoskeletal:  Positive for arthralgias, back pain and myalgias. Negative for gait problem.  Psychiatric/Behavioral:         Please refer to HPI    Medications: I  have reviewed the patient's current medications.  Current Outpatient Medications  Medication Sig Dispense Refill   acetaminophen (TYLENOL) 325 MG tablet Take 650 mg by mouth every 6  (six) hours as needed for moderate pain.     amLODipine (NORVASC) 5 MG tablet Take 5 mg by mouth daily.     b complex vitamins capsule Take 1 capsule by mouth daily.     baclofen (LIORESAL) 10 MG tablet Take 10 mg by mouth 3 (three) times daily. Reports taking 2-3 times daily     BIOTIN PO Take by mouth.     Carboxymethylcellulose Sodium (THERATEARS OP) Place 1 drop into both eyes 4 (four) times daily as needed (dry eyes). (Patient not taking: Reported on 10/27/2022)     cetirizine (ZYRTEC) 10 MG tablet Take 10 mg by mouth daily as needed for allergies.     Cholecalciferol (VITAMIN D3) 25 MCG (1000 UT) CAPS Take by mouth.     Dextromethorphan-buPROPion ER (AUVELITY) 45-105 MG TBCR Take 1 tablet daily for 3 days, then increase to 1 tablet twice daily 60 tablet 0   diclofenac Sodium (VOLTAREN) 1 % GEL Apply 2 g topically daily as needed (pain).     DULoxetine (CYMBALTA) 30 MG capsule Take 1 capsule (30 mg total) by mouth daily. 30 capsule 1   EPINEPHrine 0.3 mg/0.3 mL IJ SOAJ injection Inject 0.3 mg into the muscle as needed for anaphylaxis.     fluticasone (FLONASE) 50 MCG/ACT nasal spray Place 2 sprays into both nostrils 2 (two) times daily as needed for allergies or rhinitis.     lidocaine (LIDODERM) 5 % Place 1 patch onto the skin daily.     magnesium 30 MG tablet Take 30 mg by mouth at bedtime.     methotrexate (RHEUMATREX) 2.5 MG tablet Take by mouth.     mirtazapine (REMERON) 15 MG tablet TAKE 1/2-1 TABLET BY MOUTH AT BEDTIME 90 tablet 0   rizatriptan (MAXALT) 10 MG tablet Take 10 mg by mouth daily as needed for migraine.      saccharomyces boulardii (FLORASTOR) 250 MG capsule Take 250 mg by mouth daily.     sulindac (CLINORIL) 150 MG tablet Take 150 mg by mouth 2 (two) times daily. (Patient not taking: Reported on 10/26/2022)     XTAMPZA ER 13.5 MG C12A Take 13.5 mg by mouth every 12 (twelve) hours.     No current facility-administered medications for this visit.    Medication Side  Effects: Other: Felt "drugged" with concentration difficulties  Allergies:  Allergies  Allergen Reactions   Gabapentin Other (See Comments)    Delirium, stroke like symptoms  Other Reaction(s): Myalgia, Other  Has stroke symptoms when taking this with percocet.     Other reaction(s): Delirium    Delirium, stroke like symptoms   Amitriptyline Other (See Comments)   Amlodipine Other (See Comments)    headaches   Polymyxin B Other (See Comments)    Eyes go blood red    Pregabalin Itching and Rash    Itchy red rash on chest  Other Reaction(s): other   Zolpidem Rash    "sleep walk"    Cymbalta [Duloxetine Hcl] Other (See Comments)    Headaches, constipation   Fluoxetine Other (See Comments)    CONSTIPATION    Lamotrigine Rash   Other Other (See Comments)    OTOBIOTIC > RED EYES    Past Medical History:  Diagnosis Date   Allergic rhinitis    Anxiety    Arthritis  Cancer (HCC)    skin   Chronic pain    Chronically dry eyes    Depression    Fibromyalgia    GERD (gastroesophageal reflux disease)    Headache    Osteoporosis    Sleep apnea    Stroke (HCC)    from gabapenting per pt no deficits   Vitamin D deficiency    Wears glasses     Past Medical History, Surgical history, Social history, and Family history were reviewed and updated as appropriate.   Please see review of systems for further details on the patient's review from today.   Objective:   Physical Exam:  There were no vitals taken for this visit.  Physical Exam  Lab Review:     Component Value Date/Time   NA 140 07/04/2022 0407   NA 142 06/03/2021 1623   K 3.9 07/04/2022 0407   CL 109 07/04/2022 0407   CO2 26 07/04/2022 0407   GLUCOSE 95 07/04/2022 0407   BUN 15 07/04/2022 0407   BUN 21 06/03/2021 1623   CREATININE 0.73 07/04/2022 0407   CALCIUM 8.1 (L) 07/04/2022 0407   PROT 7.4 04/21/2021 0910   ALBUMIN 3.8 04/21/2021 0910   AST 26 04/21/2021 0910   ALT 21 04/21/2021 0910    ALKPHOS 120 04/21/2021 0910   BILITOT 0.7 04/21/2021 0910   GFRNONAA >60 07/04/2022 0407   GFRAA >60 06/26/2019 1008       Component Value Date/Time   WBC 7.0 06/18/2022 1036   RBC 4.71 06/18/2022 1036   HGB 10.8 (L) 07/04/2022 0407   HGB 12.1 06/03/2021 1622   HCT 32.8 (L) 07/04/2022 0407   HCT 37.6 06/03/2021 1622   PLT 294 06/18/2022 1036   PLT 336 06/03/2021 1622   MCV 94.5 06/18/2022 1036   MCV 92 06/03/2021 1622   MCH 29.9 06/18/2022 1036   MCHC 31.7 06/18/2022 1036   RDW 12.8 06/18/2022 1036   RDW 12.8 06/03/2021 1622   LYMPHSABS 2.0 06/03/2021 1622   MONOABS 0.5 04/21/2021 0910   EOSABS 0.1 06/03/2021 1622   BASOSABS 0.0 06/03/2021 1622    No results found for: "POCLITH", "LITHIUM"   No results found for: "PHENYTOIN", "PHENOBARB", "VALPROATE", "CBMZ"   .res Assessment: Plan:    There are no diagnoses linked to this encounter.   Please see After Visit Summary for patient specific instructions.  Future Appointments  Date Time Provider Department Center  01/18/2023 11:00 AM Bonnita Levan, RD [facility]  01/18/2023 11:45 AM Edrick Oh, PT [facility] None  01/21/2023 11:00 AM Riddles, Enrique Sack, PT [facility] None  01/25/2023 10:20 AM Raulkar, Drema Pry, MD [facility]  01/25/2023 11:45 AM Edrick Oh, PT [facility] None  01/28/2023 11:45 AM Edrick Oh, PT [facility] None  02/16/2023 12:30 PM Menke, Calhoun, PT [facility] None  02/22/2023 10:45 AM Louann Sjogren, DPM [facility]  10/04/2023 10:30 AM Drema Dallas, DO [facility] None    No orders of the defined types were placed in this encounter.   -------------------------------

## 2023-01-12 ENCOUNTER — Ambulatory Visit: Payer: Medicare HMO | Admitting: Rehabilitative and Restorative Service Providers"

## 2023-01-18 ENCOUNTER — Ambulatory Visit: Payer: Medicare HMO | Attending: Family Medicine

## 2023-01-18 ENCOUNTER — Ambulatory Visit: Payer: No Typology Code available for payment source | Admitting: Dietician

## 2023-01-18 ENCOUNTER — Telehealth: Payer: Self-pay | Admitting: Psychiatry

## 2023-01-18 DIAGNOSIS — M542 Cervicalgia: Secondary | ICD-10-CM | POA: Insufficient documentation

## 2023-01-18 DIAGNOSIS — R293 Abnormal posture: Secondary | ICD-10-CM | POA: Insufficient documentation

## 2023-01-18 DIAGNOSIS — G4700 Insomnia, unspecified: Secondary | ICD-10-CM

## 2023-01-18 DIAGNOSIS — M5459 Other low back pain: Secondary | ICD-10-CM | POA: Insufficient documentation

## 2023-01-18 DIAGNOSIS — F4310 Post-traumatic stress disorder, unspecified: Secondary | ICD-10-CM

## 2023-01-18 DIAGNOSIS — M6281 Muscle weakness (generalized): Secondary | ICD-10-CM | POA: Insufficient documentation

## 2023-01-18 DIAGNOSIS — M810 Age-related osteoporosis without current pathological fracture: Secondary | ICD-10-CM | POA: Diagnosis not present

## 2023-01-18 DIAGNOSIS — F331 Major depressive disorder, recurrent, moderate: Secondary | ICD-10-CM

## 2023-01-18 NOTE — Therapy (Signed)
OUTPATIENT PHYSICAL THERAPY THORACOLUMBAR TREATMENT   Patient Name: Sharon Logan MRN: [id number] DOB:[birth year], 67 y.o., female Today's Date: 11/03/2022     END OF SESSION:  PT End of Session - 01/18/23 1240     Visit Number 13    Date for PT Re-Evaluation 01/29/23    Authorization Type [employer] 25 total 11/03/22-01/29/23    Authorization - Visit Number 12    Authorization - Number of Visits 24    Progress Note Due on Visit 20    PT Start Time 1148    PT Stop Time 1233    PT Time Calculation (min) 45 min    Activity Tolerance Patient tolerated treatment well    Behavior During Therapy WFL for tasks assessed/performed                     Past Medical History:  Diagnosis Date   Allergic rhinitis    Anxiety    Arthritis    Cancer (HCC)    skin   Chronic pain    Chronically dry eyes    Depression    Fibromyalgia    GERD (gastroesophageal reflux disease)    Headache    Osteoporosis    Sleep apnea    Stroke (HCC)    from gabapenting per pt no deficits   Vitamin D deficiency    Wears glasses    Past Surgical History:  Procedure Laterality Date   ABDOMINAL HYSTERECTOMY     BACK SURGERY     BUNIONECTOMY Bilateral    EYE SURGERY     laser surgery   NASAL SINUS SURGERY     x2   REVERSE SHOULDER ARTHROPLASTY Right 07/03/2022   Procedure: REVERSE SHOULDER ARTHROPLASTY;  Surgeon: Beverely Low, MD;  Location: [facility];  Service: Orthopedics;  Laterality: Right;  120 min choice with interscalene block   SACROILIAC JOINT FUSION Bilateral 09/12/2020   Procedure: REMOVAL OF BILATERAL PELVIC SCREWS;  Surgeon: Estill Bamberg, MD;  Location: [facility];  Service: Orthopedics;  Laterality: Bilateral;   SKIN CANCER EXCISION     face x3   TUBAL LIGATION     Patient Active Problem List   Diagnosis Date Noted   S/P shoulder replacement, right 07/03/2022   Allergic contact dermatitis due to other agents 04/13/2022   S/P lumbar microdiscectomy 04/30/2021   S/P  hardware removal 09/12/2020   Radiculopathy 06/29/2019   Cervical radiculopathy 08/15/2018   Chronic sinusitis 07/27/2018   Constipation 07/27/2018   Chronic bilateral low back pain with bilateral sciatica 07/01/2018   Disorder of bone and cartilage 04/04/2018   Esophageal reflux 04/04/2018   Other and unspecified hyperlipidemia 04/04/2018   Other malaise and fatigue 04/04/2018   Pain in joint involving ankle and foot 04/04/2018   Leg pain 04/04/2018   Posttraumatic stress disorder 04/04/2018   Raynaud's phenomenon 04/04/2018   Skin sensation disturbance 04/04/2018   Sleep disturbance 04/04/2018   Vitamin D deficiency 04/04/2018   Myoclonus, segmental 10/18/2017   Adjacent segment disease with spinal stenosis 10/04/2017   DDD (degenerative disc disease), lumbar 10/30/2016   Spondylolisthesis of lumbar region 08/21/2016   Overweight with body mass index (BMI) 25.0-29.9 07/22/2016   Capsulitis of metatarsophalangeal (MTP) joint of right foot 03/23/2016   Lumbar spinal stenosis 12/05/2015   Migraine 09/13/2012   Other allergic rhinitis 09/13/2012   Neuropathy 09/13/2012    PCP: Moshe Cipro NP  REFERRING PROVIDER: Moshe Cipro NP  REFERRING DIAG:  [phone] (ICD-10-CM) - Spinal stenosis, lumbar  region without neurogenic claudication  M79.7 (ICD-10-CM) - Fibromyalgia  G89.4 (ICD-10-CM) - Chronic pain syndrome  M81.0 (ICD-10-CM) - Age-related osteoporosis without current pathological fracture    Rationale for Evaluation and Treatment: Rehabilitation  THERAPY DIAG:  Back pain, weakness, osteoporosis  ONSET DATE: 06/15/2022 (exacerbation of chronic problem)  SUBJECTIVE:                                                                                                                                                                                           SUBJECTIVE STATEMENT: I want to try DN again to see if it will help more.  I am going to [employer] classes  now.     PERTINENT HISTORY:   spinal cord stimulator next week (May) no twisting, no bending until end of July but sees MD early July; no aquatic PT for 4 months I had an ablation several months ago and I'll never do it again  Reverse TSR. Surgery on 07-03-22   L3-4 andL5-S1 PLIF 2023; fibromyalgia, chronic pain,  OA,  osteoporosis (unsure what areas are impacted) Depression Osteoporosis: T score: -2.9 proximal femur, forearm -1.6 PAIN:  PAIN:  Are you having pain? yes NPRS scale: 4-7/10 Pain location: between the shoulders (Lt>Rt) Aggravating factors: stood too long, walk extra, extra housecleaning (energy depleting) Relieving factors: putting feet up with sitting or lying down for 20 min  PRECAUTIONS: None  WEIGHT BEARING RESTRICTIONS: No  FALLS:  Has patient fallen in last 6 months? No my balance could be better though  LIVING ENVIRONMENT: Lives in: House/apartment Stairs: 1 step to enter   OCCUPATION: retired Enjoys walking, reading, listen to music, tend to plants on deck with hose Has a black lab  PLOF: Independent  PATIENT GOALS: 4 day bus trip in October; I'd like to walk in the [place] and feel mobile   NEXT MD VISIT: every 6 weeks  OBJECTIVE:   DIAGNOSTIC FINDINGS:  Pt will look for Dexa scan results in her records   PATIENT SURVEYS:  Modified Oswestry 30% moderate self perceived disability 12/28/22: Modified O[place]ry: 24% perceived disability  ABC: 73.125% confidence    COGNITION: Overall cognitive status: Within functional limits for tasks assessed      MUSCLE LENGTH: Dec bil hip flexor lengths: 8 degrees bil of hip extension  POSTURE: decreased lumbar lordosis and increased thoracic kyphosis   LUMBAR ROM:  Uses a squat method to reach floor with ease; unable to lie prone  AROM eval  Flexion WFLs  Extension 5 degrees  Right lateral flexion   Left lateral flexion   Right rotation   Left rotation    (  Blank rows = not tested)  TRUNK  STRENGTH:  Decreased activation of transverse abdominus muscles; abdominals 4-/5; decreased activation of lumbar multifidi; trunk extensors 4-/5 6/18:  abdominals and trunk extensors grossly 4/5  LOWER EXTREMITY ROM:   WFLs  LOWER EXTREMITY MMT:  SLS left 3 sec; right SLS 1 sec with pelvic drop;  quadruped bird dog pelvic drop and compensatory rotation right/left Frequent HS cramps during movement testing  MMT Right eval Left eval 6/18  Hip flexion 4 4 4+  Hip extension 4- 4- 4  Hip abduction 3+ 3+ 4-  Hip adduction     Hip internal rotation     Hip external rotation 4- 4- 4  Knee flexion 4 4 4   Knee extension 4 4 4   Ankle dorsiflexion     Ankle plantarflexion     Ankle inversion     Ankle eversion      (Blank rows = not tested)  UPPER BACK STRENGTH:  mid traps  L 5  R 5 , Low traps L 3+  R, Rhomboids  L 4+/5  R  4+/5   FUNCTIONAL TESTS:  Able to rise from standard height chair without UE assist  GAIT: 11/12/22: : 1,303 feet Comments: no assistive device needed  TODAY'S TREATMENT:        01/04/23: Nustep L6 x8  min PT present to discuss progress Seated X to V with looking up to encourage thoracic extension 2 x 10 Sidestepping hurdles at counter top Leg press: 75# bil 2x15, single leg 35# 2x10 bil each Forward step to over hurdles without UE support -SBA by PT for safety Trigger Point Dry-Needling  Treatment instructions: Expect mild to moderate muscle soreness. S/S of pneumothorax if dry needled over a lung field, and to seek immediate medical attention should they occur. Patient verbalized understanding of these instructions and education.  Patient Consent Given: Yes Education handout provided: Yes Muscles treated: Lt thoracic multifidi and rhomboid, subscapularis Treatment response/outcome: Utilized skilled palpation to identify trigger points.  During dry needling able to palpate muscle twitch and muscle elongation  Elongation and release to  Skilled palpation  and monitoring by PT during dry needling                                                                                                                        DATE:  7/26: NuStep L5 x 10' PT present to discuss status Hurdles with single UE support in parallel bars 3 passes fwd and sidestepping each Supine hooklying neck retraction with towel roll under neck 5x10" Supine green UE band x10 each: horiz abduction, ER, D2 flexion Standing bil shoulder extension and row 2x10 each Leg press: 75# bil 2x15, single leg 35# 2x10 bil each Seated B OH 2x10 with ball Sidestepping at barre with red band around thighs x 3 laps - no UE assist  01/04/23: Nustep L6 x10  min PT present to discuss progress Seated X to V with looking up to encourage thoracic  extension 2 x 10 Sidestepping hurdles at counter top Leg press: 75# bil 2x15, single leg 35# 2x10 bil each Forward step to over hurdles without UE support -SBA by PT for safety Trigger Point Dry-Needling  Treatment instructions: Expect mild to moderate muscle soreness. S/S of pneumothorax if dry needled over a lung field, and to seek immediate medical attention should they occur. Patient verbalized understanding of these instructions and education.  Patient Consent Given: Yes Education handout provided: Yes Muscles treated: Lt thoracic multifidi and rhomboid, subscapularis Treatment response/outcome: Utilized skilled palpation to identify trigger points.  During dry needling able to palpate muscle twitch and muscle elongation  Elongation and release to  Skilled palpation and monitoring by PT during dry needling      PATIENT EDUCATION:  Education details: Educated patient on anatomy and physiology of current symptoms, prognosis, plan of care as well as initial self care strategies to promote recovery  Person educated: Patient Education method: Explanation Education comprehension: verbalized understanding  HOME EXERCISE PROGRAM: Access Code:  [email or web address] URL: [email or web address] Date: 01/08/2023 Prepared by: Loistine Simas Beuhring  Exercises - Hip Flexor Stretch at Edge of Bed  - 1 x daily - 7 x weekly - 1 sets - 2-3 reps - 30 hold - Standing Row with Anchored Resistance  - 1 x daily - 7 x weekly - 3 sets - 10 reps - Standing Shoulder Extension with Resistance  - 1 x daily - 7 x weekly - 3 sets - 10 reps - Sit to Stand Without Arm Support  - 1 x daily - 7 x weekly - 2 sets - 10 reps - Bird Dog  - 1 x daily - 7 x weekly - 1 sets - 5 reps - Seated Hamstring Stretch  - 1 x daily - 7 x weekly - 1 sets - 2-3 reps - 20-30 sec hold - Standing March with Counter Support  - 1 x daily - 7 x weekly - 1 sets - 10 reps - Seated Thoracic Extension Arms Overhead  - 1 x daily - 3-4 x weekly - 1-2 sets - 10 reps - 2-3 sec hold - Standing Hip Abduction with Unilateral Counter Support  - 1 x daily - 7 x weekly - 3 sets - 5 reps - 10 seconds hold - Standing Hip Extension with Counter Support  - 1 x daily - 7 x weekly - 3 sets - 10 reps - Single Leg Heel Raise with Counter Support  - 1 x daily - 7 x weekly - 1 sets - 10 reps - Forward T with Counter Support  - 1-2 x daily - 7 x weekly - 2 sets - 5 reps - Side Stepping with Resistance at Ankles and Counter Support  - 2 x daily - 7 x weekly - 2 sets - 10 reps - Supine Cervical Retraction with Towel  - 1 x daily - 7 x weekly - 1 sets - 5 reps - 10 hold - Supine Shoulder Horizontal Abduction with Resistance  - 1 x daily - 7 x weekly - 1 sets - 10 reps - Supine Shoulder External Rotation with Resistance  - 1 x daily - 7 x weekly - 1 sets - 10 reps - Supine PNF D2 Flexion with Resistance  - 1 x daily - 7 x weekly - 1 sets - 10 reps - Standing Row with Anchored Resistance  - 1 x daily - 7 x weekly - 1 sets - 10 reps - Standing Shoulder Extension with Resistance  -  1 x daily - 7 x weekly - 1 sets - 10 reps  ASSESSMENT:  CLINICAL IMPRESSION: Pt reports that her LBP feels better overall and she has  continued Lt thoracic and shoulder pain.  Pt was agreeable to try DN again to this region and had good response to twitch and improved tissue mobility after.  PT monitored pt throughout session for technique and pain.  Patient will benefit from skilled PT to address the below impairments and improve overall function.    OBJECTIVE IMPAIRMENTS: decreased activity tolerance, decreased strength, impaired perceived functional ability, and pain.   ACTIVITY LIMITATIONS: carrying, lifting, bending, standing, and locomotion level  PARTICIPATION LIMITATIONS: cleaning, community activity, and yard work  PERSONAL FACTORS: 3+ comorbidities: multi regions affected, fibromyalgia, time since onset , osteoporosis are also affecting patient's functional outcome.   REHAB POTENTIAL: Good  CLINICAL DECISION MAKING: Evolving/moderate complexity  EVALUATION COMPLEXITY: Moderate   GOALS: Goals reviewed with patient? Yes  SHORT TERM GOALS: Target date: 12/01/2022    The patient will demonstrate knowledge of basic self care strategies, osteoporosis education and exercises to promote health Baseline: Goal status: met 6/18 2.  The patient will have improved bil hip strength to at least 4/5 needed for standing, walking longer distances and descending stairs at home and in the community  Baseline:  Goal status: met 6/18  3.  The patient will have improved trunk flexor and extensor muscle strength to at least 4/5 needed for lifting medium weight objects such as grocery bags Baseline:  Goal status: met 6/18 4.  Patient will demo hip hinge and squat needed for watering her plants  Baseline:  Goal status: met 6/20    LONG TERM GOALS: Target date: 01/29/23    The patient will be independent in a safe self progression of a home exercise program to promote further recovery of function  Baseline:  Goal status: IN PROGRESS  2.  The patient will report a 50% improvement in pain levels with functional  activities which are currently difficult including standing and walking for longer periods of time and housework Baseline:  85-90% (12/28/22) Goal status: MET  3.  The patient will have improved trunk flexor and extensor muscle strength to at least 4+/5 needed for lifting medium weight objects such as  luggage for bus trip [place] Baseline:  Goal status: IN PROGRESS  4.  The patient will have improved gait stamina and speed needed to ambulate 800 feet in 6 minutes  Baseline: 1303 (11/12/22) Goal status: MET  5.  Modified Oswestry improved < or = to 10% indicating improved function with less pain Baseline: 24% (12/28/22) Goal status: REVISED  6. Improve ABC to > or = to 81% self confidence for balance  Baseline: 73.125%  Goal Status: NEW    PLAN:  PT FREQUENCY: 2x/week  PT DURATION: 4 weeks  PLANNED INTERVENTIONS: Therapeutic exercises, Therapeutic activity, Neuromuscular re-education, Balance training, Gait training, Patient/Family education, Self Care, Joint mobilization, Aquatic Therapy, Dry Needling, Moist heat, Taping, Manual therapy, and Re-evaluation.  PLAN FOR NEXT SESSION: See how pt responded to DN, posture, strength and endurance  continue with weight training including upper back strengthening, balance progression  Lorrene Reid, PT 01/18/23 12:43 PM

## 2023-01-18 NOTE — Telephone Encounter (Signed)
Patient lvm stating that she doesn't like the Belsorma medication she has been prescribed and would like to try Lunesta or Lexapro. Pls rtc to discuss [phone]

## 2023-01-19 NOTE — Telephone Encounter (Signed)
LVM to [facility] 

## 2023-01-20 ENCOUNTER — Ambulatory Visit (HOSPITAL_COMMUNITY): Payer: No Typology Code available for payment source

## 2023-01-20 DIAGNOSIS — M159 Polyosteoarthritis, unspecified: Secondary | ICD-10-CM | POA: Diagnosis not present

## 2023-01-20 DIAGNOSIS — Z79899 Other long term (current) drug therapy: Secondary | ICD-10-CM | POA: Diagnosis not present

## 2023-01-20 DIAGNOSIS — M0609 Rheumatoid arthritis without rheumatoid factor, multiple sites: Secondary | ICD-10-CM | POA: Diagnosis not present

## 2023-01-20 DIAGNOSIS — M180 Bilateral primary osteoarthritis of first carpometacarpal joints: Secondary | ICD-10-CM | POA: Diagnosis not present

## 2023-01-21 ENCOUNTER — Ambulatory Visit: Payer: Medicare HMO | Admitting: Physical Therapy

## 2023-01-21 MED ORDER — ESCITALOPRAM OXALATE 10 MG PO TABS
ORAL_TABLET | ORAL | 1 refills | Status: DC
Start: 2023-01-21 — End: 2023-02-08

## 2023-01-21 MED ORDER — ESZOPICLONE 2 MG PO TABS
2.0000 mg | ORAL_TABLET | Freq: Every evening | ORAL | 0 refills | Status: DC | PRN
Start: 2023-01-21 — End: 2023-02-08

## 2023-01-21 NOTE — Telephone Encounter (Signed)
LVM with info per DPR.  

## 2023-01-21 NOTE — Therapy (Signed)
OUTPATIENT PHYSICAL THERAPY THORACOLUMBAR TREATMENT   Patient Name: Sharon Logan MRN: [id number] DOB:06-04-1956, 67 y.o., female Today's Date: 11/03/2022     END OF SESSION:  PT End of Session - 01/22/23 0758     Visit Number 14    Number of Visits 16    Date for PT Re-Evaluation 01/29/23    Authorization Type [employer] 25 total 11/03/22-01/29/23    Authorization - Visit Number 13    Authorization - Number of Visits 24    Progress Note Due on Visit 20    PT Start Time 0757    PT Stop Time 0842    PT Time Calculation (min) 45 min    Activity Tolerance Patient tolerated treatment well    Behavior During Therapy WFL for tasks assessed/performed                      Past Medical History:  Diagnosis Date   Allergic rhinitis    Anxiety    Arthritis    Cancer (HCC)    skin   Chronic pain    Chronically dry eyes    Depression    Fibromyalgia    GERD (gastroesophageal reflux disease)    Headache    Osteoporosis    Sleep apnea    Stroke (HCC)    from gabapenting per pt no deficits   Vitamin D deficiency    Wears glasses    Past Surgical History:  Procedure Laterality Date   ABDOMINAL HYSTERECTOMY     BACK SURGERY     BUNIONECTOMY Bilateral    EYE SURGERY     laser surgery   NASAL SINUS SURGERY     x2   REVERSE SHOULDER ARTHROPLASTY Right 07/03/2022   Procedure: REVERSE SHOULDER ARTHROPLASTY;  Surgeon: Beverely Low, MD;  Location: [facility];  Service: Orthopedics;  Laterality: Right;  120 min choice with interscalene block   SACROILIAC JOINT FUSION Bilateral 09/12/2020   Procedure: REMOVAL OF BILATERAL PELVIC SCREWS;  Surgeon: Estill Bamberg, MD;  Location: [facility];  Service: Orthopedics;  Laterality: Bilateral;   SKIN CANCER EXCISION     face x3   TUBAL LIGATION     Patient Active Problem List   Diagnosis Date Noted   S/P shoulder replacement, right 07/03/2022   Allergic contact dermatitis due to other agents 04/13/2022   S/P lumbar  microdiscectomy 04/30/2021   S/P hardware removal 09/12/2020   Radiculopathy 06/29/2019   Cervical radiculopathy 08/15/2018   Chronic sinusitis 07/27/2018   Constipation 07/27/2018   Chronic bilateral low back pain with bilateral sciatica 07/01/2018   Disorder of bone and cartilage 04/04/2018   Esophageal reflux 04/04/2018   Other and unspecified hyperlipidemia 04/04/2018   Other malaise and fatigue 04/04/2018   Pain in joint involving ankle and foot 04/04/2018   Leg pain 04/04/2018   Posttraumatic stress disorder 04/04/2018   Raynaud's phenomenon 04/04/2018   Skin sensation disturbance 04/04/2018   Sleep disturbance 04/04/2018   Vitamin D deficiency 04/04/2018   Myoclonus, segmental 10/18/2017   Adjacent segment disease with spinal stenosis 10/04/2017   DDD (degenerative disc disease), lumbar 10/30/2016   Spondylolisthesis of lumbar region 08/21/2016   Overweight with body mass index (BMI) 25.0-29.9 07/22/2016   Capsulitis of metatarsophalangeal (MTP) joint of right foot 03/23/2016   Lumbar spinal stenosis 12/05/2015   Migraine 09/13/2012   Other allergic rhinitis 09/13/2012   Neuropathy 09/13/2012    PCP: Moshe Cipro NP  REFERRING PROVIDER: Moshe Cipro NP  REFERRING  DIAG:  M48.061 (ICD-10-CM) - Spinal stenosis, lumbar region without neurogenic claudication  M79.7 (ICD-10-CM) - Fibromyalgia  G89.4 (ICD-10-CM) - Chronic pain syndrome  M81.0 (ICD-10-CM) - Age-related osteoporosis without current pathological fracture    Rationale for Evaluation and Treatment: Rehabilitation  THERAPY DIAG:  Back pain, weakness, osteoporosis  ONSET DATE: 06/15/2022 (exacerbation of chronic problem)  SUBJECTIVE:                                                                                                                                                                                           SUBJECTIVE STATEMENT: The needling helped, but then I woke up the next morning  and it's back.    PERTINENT HISTORY:   spinal cord stimulator next week (May) no twisting, no bending until end of July but sees MD early July; no aquatic PT for 4 months I had an ablation several months ago and I'll never do it again  Reverse TSR. Surgery on 07-03-22   L3-4 andL5-S1 PLIF 2023; fibromyalgia, chronic pain,  OA,  osteoporosis (unsure what areas are impacted) Depression Osteoporosis: T score: -2.9 proximal femur, forearm -1.6 PAIN:  PAIN:  Are you having pain? yes NPRS scale: 4/10 Pain location: between the shoulders (Lt>Rt) Aggravating factors: stood too long, walk extra, extra housecleaning (energy depleting) Relieving factors: putting feet up with sitting or lying down for 20 min  PRECAUTIONS: None  WEIGHT BEARING RESTRICTIONS: No  FALLS:  Has patient fallen in last 6 months? No my balance could be better though  LIVING ENVIRONMENT: Lives in: House/apartment Stairs: 1 step to enter   OCCUPATION: retired Enjoys walking, reading, listen to music, tend to plants on deck with hose Has a black lab  PLOF: Independent  PATIENT GOALS: 4 day bus trip in October; I'd like to walk in the [place] and feel mobile   NEXT MD VISIT: every 6 weeks  OBJECTIVE:   DIAGNOSTIC FINDINGS:  Pt will look for Dexa scan results in her records   PATIENT SURVEYS:  Modified Oswestry 30% moderate self perceived disability 12/28/22: Modified [place]: 24% perceived disability  ABC: 73.125% confidence    COGNITION: Overall cognitive status: Within functional limits for tasks assessed      MUSCLE LENGTH: Dec bil hip flexor lengths: 8 degrees bil of hip extension  POSTURE: decreased lumbar lordosis and increased thoracic kyphosis   LUMBAR ROM:  Uses a squat method to reach floor with ease; unable to lie prone  AROM eval  Flexion WFLs  Extension 5 degrees  Right lateral flexion   Left lateral flexion   Right rotation   Left rotation    (Blank  rows = not  tested)  TRUNK STRENGTH:  Decreased activation of transverse abdominus muscles; abdominals 4-/5; decreased activation of lumbar multifidi; trunk extensors 4-/5 6/18:  abdominals and trunk extensors grossly 4/5  LOWER EXTREMITY ROM:   WFLs  LOWER EXTREMITY MMT:  SLS left 3 sec; right SLS 1 sec with pelvic drop;  quadruped bird dog pelvic drop and compensatory rotation right/left Frequent HS cramps during movement testing  MMT Right eval Left eval 6/18 Right 01/22/23 Left  01/22/23  Hip flexion 4 4 4+ 5 5  Hip extension 4- 4- 4 5 5   Hip abduction 3+ 3+ 4- 5 5  Hip adduction    5 4+  Hip internal rotation       Hip external rotation 4- 4- 4 5 4+  Knee flexion 4 4 4 5 5   Knee extension 4 4 4 5  4+  Ankle dorsiflexion       Ankle plantarflexion       Ankle inversion       Ankle eversion        (Blank rows = not tested)  UPPER BACK STRENGTH:  mid traps  L 5  R 5 , Low traps L 3+  R, Rhomboids  L 4+/5  R  4+/5   FUNCTIONAL TESTS:  Able to rise from standard height chair without UE assist  GAIT: 11/12/22: : 1,303 feet Comments: no assistive device needed  TODAY'S TREATMENT:        01/22/23: Nustep L6 x 10  min PT present to discuss progress Seated with ball behind back X to V with looking up to encourage thoracic extension 2 x 10 Sidestepping hurdles at barre x 4 laps Forward step to over hurdles without UE support  step-to and reciprocal x 4 laps ea -SBA by PT for safety Sidestepping at barre with red band around thighs x 6 laps - no UE assist Standing 3 way hip (post clock) 2 x 10 B Red band at thighs Standing bil shoulder extension and row 2x10 each Supine green UE band 2x10 of horiz abduction, ER, D2 flexion x 10 ea Leg press: 75# bil 2x15, single leg 35# 2x10 bil each Seated hip IR with ball b/w knees x 20   01/04/23: Nustep L6 x8  min PT present to discuss progress Seated X to V with looking up to encourage thoracic extension 2 x 10 Sidestepping hurdles at counter  top Leg press: 75# bil 2x15, single leg 35# 2x10 bil each Forward step to over hurdles without UE support -SBA by PT for safety Trigger Point Dry-Needling  Treatment instructions: Expect mild to moderate muscle soreness. S/S of pneumothorax if dry needled over a lung field, and to seek immediate medical attention should they occur. Patient verbalized understanding of these instructions and education.  Patient Consent Given: Yes Education handout provided: Yes Muscles treated: Lt thoracic multifidi and rhomboid, subscapularis Treatment response/outcome: Utilized skilled palpation to identify trigger points.  During dry needling able to palpate muscle twitch and muscle elongation  Elongation and release to  Skilled palpation and monitoring by PT during dry needling  DATE:  7/26: NuStep L5 x 10' PT present to discuss status Hurdles with single UE support in parallel bars 3 passes fwd and sidestepping each Supine hooklying neck retraction with towel roll under neck 5x10" Supine green UE band x10 each: horiz abduction, ER, D2 flexion Standing bil shoulder extension and row 2x10 each Leg press: 75# bil 2x15, single leg 35# 2x10 bil each Seated B OH 2x10 with ball Sidestepping at barre with red band around thighs x 3 laps - no UE assist  01/04/23: Nustep L6 x10  min PT present to discuss progress Seated X to V with looking up to encourage thoracic extension 2 x 10 Sidestepping hurdles at counter top Leg press: 75# bil 2x15, single leg 35# 2x10 bil each Forward step to over hurdles without UE support -SBA by PT for safety Trigger Point Dry-Needling  Treatment instructions: Expect mild to moderate muscle soreness. S/S of pneumothorax if dry needled over a lung field, and to seek immediate medical attention should they occur. Patient verbalized understanding of these instructions  and education.  Patient Consent Given: Yes Education handout provided: Yes Muscles treated: Lt thoracic multifidi and rhomboid, subscapularis Treatment response/outcome: Utilized skilled palpation to identify trigger points.  During dry needling able to palpate muscle twitch and muscle elongation  Elongation and release to  Skilled palpation and monitoring by PT during dry needling      PATIENT EDUCATION:  Education details: Educated patient on anatomy and physiology of current symptoms, prognosis, plan of care as well as initial self care strategies to promote recovery  Person educated: Patient Education method: Explanation Education comprehension: verbalized understanding  HOME EXERCISE PROGRAM: Access Code: [email or web address] URL: [email or web address] Date: 01/22/2023 Prepared by: Raynelle Fanning  Exercises - Hip Flexor Stretch at Edge of Bed  - 1 x daily - 7 x weekly - 1 sets - 2-3 reps - 30 hold - Standing Row with Anchored Resistance  - 1 x daily - 7 x weekly - 3 sets - 10 reps - Standing Shoulder Extension with Resistance  - 1 x daily - 7 x weekly - 3 sets - 10 reps - Sit to Stand Without Arm Support  - 1 x daily - 7 x weekly - 2 sets - 10 reps - Bird Dog  - 1 x daily - 7 x weekly - 1 sets - 5 reps - Seated Hamstring Stretch  - 1 x daily - 7 x weekly - 1 sets - 2-3 reps - 20-30 sec hold - Standing March with Counter Support  - 1 x daily - 7 x weekly - 1 sets - 10 reps - Seated Thoracic Extension Arms Overhead  - 1 x daily - 3-4 x weekly - 1-2 sets - 10 reps - 2-3 sec hold - Standing Hip Abduction with Unilateral Counter Support  - 1 x daily - 7 x weekly - 3 sets - 5 reps - 10 seconds hold - Standing Hip Extension with Counter Support  - 1 x daily - 7 x weekly - 3 sets - 10 reps - Single Leg Heel Raise with Counter Support  - 1 x daily - 7 x weekly - 1 sets - 10 reps - Forward T with Counter Support  - 1-2 x daily - 7 x weekly - 2 sets - 5 reps - Side Stepping with Resistance at  Ankles and Counter Support  - 2 x daily - 7 x weekly - 2 sets - 10 reps - Supine Cervical Retraction with Towel  - 1  x daily - 7 x weekly - 1 sets - 5 reps - 10 hold - Supine Shoulder Horizontal Abduction with Resistance  - 1 x daily - 7 x weekly - 1 sets - 10 reps - Supine Shoulder External Rotation with Resistance  - 1 x daily - 7 x weekly - 1 sets - 10 reps - Supine PNF D2 Flexion with Resistance  - 1 x daily - 7 x weekly - 1 sets - 10 reps - Standing Row with Anchored Resistance  - 1 x daily - 7 x weekly - 1 sets - 10 reps - Standing Shoulder Extension with Resistance  - 1 x daily - 7 x weekly - 1 sets - 10 reps - Seated Hip Internal Rotation Active ROM  - 1 x daily - 7 x weekly - 2-3 sets - 10 reps  ASSESSMENT:  CLINICAL IMPRESSION: Sharon Logan did well with TE today. She demonstrated good control with fwd and lateral steps over hurdles. MMT done and she has met her long term strength goal. Still some weakness in L hip ADD and ER. HEP progressed for this.   OBJECTIVE IMPAIRMENTS: decreased activity tolerance, decreased strength, impaired perceived functional ability, and pain.   ACTIVITY LIMITATIONS: carrying, lifting, bending, standing, and locomotion level  PARTICIPATION LIMITATIONS: cleaning, community activity, and yard work  PERSONAL FACTORS: 3+ comorbidities: multi regions affected, fibromyalgia, time since onset , osteoporosis are also affecting patient's functional outcome.   REHAB POTENTIAL: Good  CLINICAL DECISION MAKING: Evolving/moderate complexity  EVALUATION COMPLEXITY: Moderate   GOALS: Goals reviewed with patient? Yes  SHORT TERM GOALS: Target date: 12/01/2022    The patient will demonstrate knowledge of basic self care strategies, osteoporosis education and exercises to promote health Baseline: Goal status: met 6/18 2.  The patient will have improved bil hip strength to at least 4/5 needed for standing, walking longer distances and descending stairs at home and  in the community  Baseline:  Goal status: met 6/18  3.  The patient will have improved trunk flexor and extensor muscle strength to at least 4/5 needed for lifting medium weight objects such as grocery bags Baseline:  Goal status: met 6/18 4.  Patient will demo hip hinge and squat needed for watering her plants  Baseline:  Goal status: met 6/20    LONG TERM GOALS: Target date: 01/29/23    The patient will be independent in a safe self progression of a home exercise program to promote further recovery of function  Baseline:  Goal status: IN PROGRESS  2.  The patient will report a 50% improvement in pain levels with functional activities which are currently difficult including standing and walking for longer periods of time and housework Baseline:  85-90% (12/28/22) Goal status: MET  3.  The patient will have improved trunk flexor and extensor muscle strength to at least 4+/5 needed for lifting medium weight objects such as  luggage for bus trip [place] Baseline:  Goal status: MET 01/22/23  4.  The patient will have improved gait stamina and speed needed to ambulate 800 feet in 6 minutes  Baseline: 1303 (11/12/22) Goal status: MET  5.  Modified Oswestry improved < or = to 10% indicating improved function with less pain Baseline: 24% (12/28/22) Goal status: REVISED  6. Improve ABC to > or = to 81% self confidence for balance  Baseline: 73.125%  Goal Status: NEW    PLAN:  PT FREQUENCY: 2x/week  PT DURATION: 4 weeks  PLANNED INTERVENTIONS: Therapeutic  exercises, Therapeutic activity, Neuromuscular re-education, Balance training, Gait training, Patient/Family education, Self Care, Joint mobilization, Aquatic Therapy, Dry Needling, Moist heat, Taping, Manual therapy, and Re-evaluation.  PLAN FOR NEXT SESSION: See how pt responded to DN, posture, strength and endurance  continue with weight training including upper back strengthening, balance progression  Lorrene Reid, PT 01/22/23 8:45 AM

## 2023-01-21 NOTE — Telephone Encounter (Signed)
Patient returned call but I was not available. Left another VM to RC.

## 2023-01-21 NOTE — Telephone Encounter (Signed)
Patient reporting Belsomra not keeping her asleep and she is edgy in the morning. She said she had discussed with her pain doctor and diazepam was ok but not benzodiazepine. I told her that diazepam was an benzodiazepine. She said she tried [place] in 2008 and thought it worked well. I don't see in Epic med history to see what dosage was and I can only go back 6 years in PMPD. She is also reporting that since she was off duloxetine you had discussed starting Lexapro and she is requesting to do that.  Pharmacy is [facility] on [address]

## 2023-01-21 NOTE — Telephone Encounter (Signed)
Please let her know that Lunesta and Lexapro have been sent to her pharmacy.

## 2023-01-22 ENCOUNTER — Ambulatory Visit (HOSPITAL_COMMUNITY)
Admission: RE | Admit: 2023-01-22 | Discharge: 2023-01-22 | Disposition: A | Discharge status: Home / Self Care | Payer: Medicare HMO | Source: Ambulatory Visit | Attending: Physician Assistant | Admitting: Physician Assistant

## 2023-01-22 ENCOUNTER — Ambulatory Visit: Payer: Medicare HMO | Admitting: Physical Therapy

## 2023-01-22 ENCOUNTER — Encounter: Payer: Self-pay | Admitting: Physical Therapy

## 2023-01-22 ENCOUNTER — Telehealth: Payer: Self-pay | Admitting: Psychiatry

## 2023-01-22 DIAGNOSIS — M542 Cervicalgia: Secondary | ICD-10-CM

## 2023-01-22 DIAGNOSIS — M5459 Other low back pain: Secondary | ICD-10-CM | POA: Diagnosis not present

## 2023-01-22 DIAGNOSIS — M810 Age-related osteoporosis without current pathological fracture: Secondary | ICD-10-CM | POA: Diagnosis not present

## 2023-01-22 DIAGNOSIS — R293 Abnormal posture: Secondary | ICD-10-CM | POA: Diagnosis not present

## 2023-01-22 DIAGNOSIS — M6281 Muscle weakness (generalized): Secondary | ICD-10-CM

## 2023-01-22 NOTE — Telephone Encounter (Signed)
Pt called at around 3pm to inform us that Lunesta RX will need a P.Auth.

## 2023-01-25 ENCOUNTER — Encounter: Payer: No Typology Code available for payment source | Admitting: Physical Medicine and Rehabilitation

## 2023-01-25 ENCOUNTER — Ambulatory Visit: Payer: Medicare HMO

## 2023-01-25 DIAGNOSIS — R293 Abnormal posture: Secondary | ICD-10-CM | POA: Diagnosis not present

## 2023-01-25 DIAGNOSIS — M542 Cervicalgia: Secondary | ICD-10-CM

## 2023-01-25 DIAGNOSIS — M5459 Other low back pain: Secondary | ICD-10-CM

## 2023-01-25 DIAGNOSIS — M810 Age-related osteoporosis without current pathological fracture: Secondary | ICD-10-CM | POA: Diagnosis not present

## 2023-01-25 DIAGNOSIS — M6281 Muscle weakness (generalized): Secondary | ICD-10-CM | POA: Diagnosis not present

## 2023-01-25 NOTE — Therapy (Signed)
OUTPATIENT PHYSICAL THERAPY THORACOLUMBAR TREATMENT   Patient Name: Sharon Logan MRN: [id number] DOB:1956-01-04, 67 y.o., female Today's Date: 11/03/2022     END OF SESSION:  PT End of Session - 01/25/23 1229     Visit Number 15    Date for PT Re-Evaluation 01/29/23    Authorization Type [employer] 25 total 11/03/22-01/29/23    Authorization - Visit Number 14    Authorization - Number of Visits 24    Progress Note Due on Visit 20    PT Start Time 1148    PT Stop Time 1230    PT Time Calculation (min) 42 min    Activity Tolerance Patient tolerated treatment well    Behavior During Therapy WFL for tasks assessed/performed                       Past Medical History:  Diagnosis Date   Allergic rhinitis    Anxiety    Arthritis    Cancer (HCC)    skin   Chronic pain    Chronically dry eyes    Depression    Fibromyalgia    GERD (gastroesophageal reflux disease)    Headache    Osteoporosis    Sleep apnea    Stroke (HCC)    from gabapenting per pt no deficits   Vitamin D deficiency    Wears glasses    Past Surgical History:  Procedure Laterality Date   ABDOMINAL HYSTERECTOMY     BACK SURGERY     BUNIONECTOMY Bilateral    EYE SURGERY     laser surgery   NASAL SINUS SURGERY     x2   REVERSE SHOULDER ARTHROPLASTY Right 07/03/2022   Procedure: REVERSE SHOULDER ARTHROPLASTY;  Surgeon: Beverely Low, MD;  Location: [facility];  Service: Orthopedics;  Laterality: Right;  120 min choice with interscalene block   SACROILIAC JOINT FUSION Bilateral 09/12/2020   Procedure: REMOVAL OF BILATERAL PELVIC SCREWS;  Surgeon: Estill Bamberg, MD;  Location: [facility];  Service: Orthopedics;  Laterality: Bilateral;   SKIN CANCER EXCISION     face x3   TUBAL LIGATION     Patient Active Problem List   Diagnosis Date Noted   S/P shoulder replacement, right 07/03/2022   Allergic contact dermatitis due to other agents 04/13/2022   S/P lumbar microdiscectomy 04/30/2021   S/P  hardware removal 09/12/2020   Radiculopathy 06/29/2019   Cervical radiculopathy 08/15/2018   Chronic sinusitis 07/27/2018   Constipation 07/27/2018   Chronic bilateral low back pain with bilateral sciatica 07/01/2018   Disorder of bone and cartilage 04/04/2018   Esophageal reflux 04/04/2018   Other and unspecified hyperlipidemia 04/04/2018   Other malaise and fatigue 04/04/2018   Pain in joint involving ankle and foot 04/04/2018   Leg pain 04/04/2018   Posttraumatic stress disorder 04/04/2018   Raynaud's phenomenon 04/04/2018   Skin sensation disturbance 04/04/2018   Sleep disturbance 04/04/2018   Vitamin D deficiency 04/04/2018   Myoclonus, segmental 10/18/2017   Adjacent segment disease with spinal stenosis 10/04/2017   DDD (degenerative disc disease), lumbar 10/30/2016   Spondylolisthesis of lumbar region 08/21/2016   Overweight with body mass index (BMI) 25.0-29.9 07/22/2016   Capsulitis of metatarsophalangeal (MTP) joint of right foot 03/23/2016   Lumbar spinal stenosis 12/05/2015   Migraine 09/13/2012   Other allergic rhinitis 09/13/2012   Neuropathy 09/13/2012    PCP: Moshe Cipro NP  REFERRING PROVIDER: Moshe Cipro NP  REFERRING DIAG:  [phone] (ICD-10-CM) - Spinal  stenosis, lumbar region without neurogenic claudication  M79.7 (ICD-10-CM) - Fibromyalgia  G89.4 (ICD-10-CM) - Chronic pain syndrome  M81.0 (ICD-10-CM) - Age-related osteoporosis without current pathological fracture    Rationale for Evaluation and Treatment: Rehabilitation  THERAPY DIAG:  Back pain, weakness, osteoporosis  ONSET DATE: 06/15/2022 (exacerbation of chronic problem)  SUBJECTIVE:                                                                                                                                                                                           SUBJECTIVE STATEMENT: DN helped me briefly but the pain comes back.  I want to try one more time.    PERTINENT  HISTORY:   spinal cord stimulator next week (May) no twisting, no bending until end of July but sees MD early July; no aquatic PT for 4 months I had an ablation several months ago and I'll never do it again  Reverse TSR. Surgery on 07-03-22   L3-4 andL5-S1 PLIF 2023; fibromyalgia, chronic pain,  OA,  osteoporosis (unsure what areas are impacted) Depression Osteoporosis: T score: -2.9 proximal femur, forearm -1.6 PAIN:  PAIN:  Are you having pain? yes NPRS scale: 4/10 Pain location: between the shoulders (Lt>Rt) Aggravating factors: stood too long, walk extra, extra housecleaning (energy depleting) Relieving factors: putting feet up with sitting or lying down for 20 min  PRECAUTIONS: None  WEIGHT BEARING RESTRICTIONS: No  FALLS:  Has patient fallen in last 6 months? No my balance could be better though  LIVING ENVIRONMENT: Lives in: House/apartment Stairs: 1 step to enter   OCCUPATION: retired Enjoys walking, reading, listen to music, tend to plants on deck with hose Has a black lab  PLOF: Independent  PATIENT GOALS: 4 day bus trip in October; I'd like to walk in the [place] and feel mobile   NEXT MD VISIT: every 6 weeks  OBJECTIVE:   DIAGNOSTIC FINDINGS:  Pt will look for Dexa scan results in her records   PATIENT SURVEYS:  Modified Oswestry 30% moderate self perceived disability 12/28/22: Modified Owestry: 24% perceived disability  ABC: 73.125% confidence    COGNITION: Overall cognitive status: Within functional limits for tasks assessed      MUSCLE LENGTH: Dec bil hip flexor lengths: 8 degrees bil of hip extension  POSTURE: decreased lumbar lordosis and increased thoracic kyphosis   LUMBAR ROM:  Uses a squat method to reach floor with ease; unable to lie prone  AROM eval  Flexion WFLs  Extension 5 degrees  Right lateral flexion   Left lateral flexion   Right rotation   Left rotation    (Blank rows = not  tested)  TRUNK STRENGTH:  Decreased  activation of transverse abdominus muscles; abdominals 4-/5; decreased activation of lumbar multifidi; trunk extensors 4-/5 6/18:  abdominals and trunk extensors grossly 4/5  LOWER EXTREMITY ROM:   WFLs  LOWER EXTREMITY MMT:  SLS left 3 sec; right SLS 1 sec with pelvic drop;  quadruped bird dog pelvic drop and compensatory rotation right/left Frequent HS cramps during movement testing  MMT Right eval Left eval 6/18 Right 01/22/23 Left  01/22/23  Hip flexion 4 4 4+ 5 5  Hip extension 4- 4- 4 5 5   Hip abduction 3+ 3+ 4- 5 5  Hip adduction    5 4+  Hip internal rotation       Hip external rotation 4- 4- 4 5 4+  Knee flexion 4 4 4 5 5   Knee extension 4 4 4 5  4+  Ankle dorsiflexion       Ankle plantarflexion       Ankle inversion       Ankle eversion        (Blank rows = not tested)  UPPER BACK STRENGTH:  mid traps  L 5  R 5 , Low traps L 3+  R, Rhomboids  L 4+/5  R  4+/5   FUNCTIONAL TESTS:  Able to rise from standard height chair without UE assist  GAIT: 11/12/22: : 1,303 feet Comments: no assistive device needed  TODAY'S TREATMENT:        01/04/23: Nustep L6 x8  min PT present to discuss progress Seated X to V with looking up to encourage thoracic extension 2 x 10 Sidestepping hurdles at counter top Leg press: 75# bil 2x15, single leg 35# 2x10 bil each Forward step to over hurdles without UE support -SBA by PT for safety Trigger Point Dry-Needling  Treatment instructions: Expect mild to moderate muscle soreness. S/S of pneumothorax if dry needled over a lung field, and to seek immediate medical attention should they occur. Patient verbalized understanding of these instructions and education.  Patient Consent Given: Yes Education handout provided: Yes Muscles treated: Lt thoracic multifidi and rhomboid, subscapularis Treatment response/outcome: Utilized skilled palpation to identify trigger points.  During dry needling able to palpate muscle twitch and muscle elongation   Elongation and release to  Skilled palpation and monitoring by PT during dry needling                                                                                  01/22/23: Nustep L6 x 10  min PT present to discuss progress Seated with ball behind back X to V with looking up to encourage thoracic extension 2 x 10 Sidestepping hurdles at barre x 4 laps Forward step to over hurdles without UE support  step-to and reciprocal x 4 laps ea -SBA by PT for safety Sidestepping at barre with red band around thighs x 6 laps - no UE assist Standing 3 way hip (post clock) 2 x 10 B Red band at thighs Standing bil shoulder extension and row 2x10 each Supine green UE band 2x10 of horiz abduction, ER, D2 flexion x 10 ea Leg press: 75# bil 2x15, single leg 35# 2x10 bil each Seated hip  IR with ball b/w knees x 20   01/04/23: Nustep L6 x8  min PT present to discuss progress Seated X to V with looking up to encourage thoracic extension 2 x 10 Sidestepping hurdles at counter top Leg press: 75# bil 2x15, single leg 35# 2x10 bil each Forward step to over hurdles without UE support -SBA by PT for safety Trigger Point Dry-Needling  Treatment instructions: Expect mild to moderate muscle soreness. S/S of pneumothorax if dry needled over a lung field, and to seek immediate medical attention should they occur. Patient verbalized understanding of these instructions and education.  Patient Consent Given: Yes Education handout provided: Yes Muscles treated: Lt thoracic multifidi and rhomboid, subscapularis Treatment response/outcome: Utilized skilled palpation to identify trigger points.  During dry needling able to palpate muscle twitch and muscle elongation  Elongation and release to  Skilled palpation and monitoring by PT during dry needling                                                                                                                          PATIENT EDUCATION:  Education details: Educated  patient on anatomy and physiology of current symptoms, prognosis, plan of care as well as initial self care strategies to promote recovery  Person educated: Patient Education method: Explanation Education comprehension: verbalized understanding  HOME EXERCISE PROGRAM: Access Code: [email or web address] URL: [email or web address] Date: 01/22/2023 Prepared by: Raynelle Fanning  Exercises - Hip Flexor Stretch at Edge of Bed  - 1 x daily - 7 x weekly - 1 sets - 2-3 reps - 30 hold - Standing Row with Anchored Resistance  - 1 x daily - 7 x weekly - 3 sets - 10 reps - Standing Shoulder Extension with Resistance  - 1 x daily - 7 x weekly - 3 sets - 10 reps - Sit to Stand Without Arm Support  - 1 x daily - 7 x weekly - 2 sets - 10 reps - Bird Dog  - 1 x daily - 7 x weekly - 1 sets - 5 reps - Seated Hamstring Stretch  - 1 x daily - 7 x weekly - 1 sets - 2-3 reps - 20-30 sec hold - Standing March with Counter Support  - 1 x daily - 7 x weekly - 1 sets - 10 reps - Seated Thoracic Extension Arms Overhead  - 1 x daily - 3-4 x weekly - 1-2 sets - 10 reps - 2-3 sec hold - Standing Hip Abduction with Unilateral Counter Support  - 1 x daily - 7 x weekly - 3 sets - 5 reps - 10 seconds hold - Standing Hip Extension with Counter Support  - 1 x daily - 7 x weekly - 3 sets - 10 reps - Single Leg Heel Raise with Counter Support  - 1 x daily - 7 x weekly - 1 sets - 10 reps - Forward T with Counter Support  - 1-2 x daily - 7 x weekly - 2 sets -  5 reps - Side Stepping with Resistance at Ankles and Counter Support  - 2 x daily - 7 x weekly - 2 sets - 10 reps - Supine Cervical Retraction with Towel  - 1 x daily - 7 x weekly - 1 sets - 5 reps - 10 hold - Supine Shoulder Horizontal Abduction with Resistance  - 1 x daily - 7 x weekly - 1 sets - 10 reps - Supine Shoulder External Rotation with Resistance  - 1 x daily - 7 x weekly - 1 sets - 10 reps - Supine PNF D2 Flexion with Resistance  - 1 x daily - 7 x weekly - 1 sets - 10  reps - Standing Row with Anchored Resistance  - 1 x daily - 7 x weekly - 1 sets - 10 reps - Standing Shoulder Extension with Resistance  - 1 x daily - 7 x weekly - 1 sets - 10 reps - Seated Hip Internal Rotation Active ROM  - 1 x daily - 7 x weekly - 2-3 sets - 10 reps  ASSESSMENT:  CLINICAL IMPRESSION: Pt would like to try DN one more time as it helps for a day or two but results aren't lasting.  Pt continues to work on endurance, strength and balance.  PT provided supervision and provided verbal cues for alignment and technique of movement.  Pt with good response to DN with multiple twitch responses and improved tissue mobility after.  Pt with overall fewer trigger points in the Lt upper back region.  Patient will benefit from skilled PT to address the below impairments and improve overall function.    OBJECTIVE IMPAIRMENTS: decreased activity tolerance, decreased strength, impaired perceived functional ability, and pain.   ACTIVITY LIMITATIONS: carrying, lifting, bending, standing, and locomotion level  PARTICIPATION LIMITATIONS: cleaning, community activity, and yard work  PERSONAL FACTORS: 3+ comorbidities: multi regions affected, fibromyalgia, time since onset , osteoporosis are also affecting patient's functional outcome.   REHAB POTENTIAL: Good  CLINICAL DECISION MAKING: Evolving/moderate complexity  EVALUATION COMPLEXITY: Moderate   GOALS: Goals reviewed with patient? Yes  SHORT TERM GOALS: Target date: 12/01/2022    The patient will demonstrate knowledge of basic self care strategies, osteoporosis education and exercises to promote health Baseline: Goal status: met 6/18 2.  The patient will have improved bil hip strength to at least 4/5 needed for standing, walking longer distances and descending stairs at home and in the community  Baseline:  Goal status: met 6/18  3.  The patient will have improved trunk flexor and extensor muscle strength to at least 4/5 needed for  lifting medium weight objects such as grocery bags Baseline:  Goal status: met 6/18 4.  Patient will demo hip hinge and squat needed for watering her plants  Baseline:  Goal status: met 6/20    LONG TERM GOALS: Target date: 01/29/23    The patient will be independent in a safe self progression of a home exercise program to promote further recovery of function  Baseline:  Goal status: IN PROGRESS  2.  The patient will report a 50% improvement in pain levels with functional activities which are currently difficult including standing and walking for longer periods of time and housework Baseline:  85-90% (12/28/22) Goal status: MET  3.  The patient will have improved trunk flexor and extensor muscle strength to at least 4+/5 needed for lifting medium weight objects such as  luggage for bus trip [place] Baseline:  Goal status: MET 01/22/23  4.  The patient will have improved gait stamina and speed needed to ambulate 800 feet in 6 minutes  Baseline: 1303 (11/12/22) Goal status: MET  5.  Modified Oswestry improved < or = to 10% indicating improved function with less pain Baseline: 24% (12/28/22) Goal status: REVISED  6. Improve ABC to > or = to 81% self confidence for balance  Baseline: 73.125%  Goal Status: NEW    PLAN:  PT FREQUENCY: 2x/week  PT DURATION: 4 weeks  PLANNED INTERVENTIONS: Therapeutic exercises, Therapeutic activity, Neuromuscular re-education, Balance training, Gait training, Patient/Family education, Self Care, Joint mobilization, Aquatic Therapy, Dry Needling, Moist heat, Taping, Manual therapy, and Re-evaluation.  PLAN FOR NEXT SESSION: D/C next session to HEP, cancel final appt.    Lorrene Reid, PT 01/25/23 12:30 PM

## 2023-01-28 ENCOUNTER — Ambulatory Visit: Payer: Medicare HMO

## 2023-01-28 DIAGNOSIS — R293 Abnormal posture: Secondary | ICD-10-CM

## 2023-01-28 DIAGNOSIS — M5459 Other low back pain: Secondary | ICD-10-CM

## 2023-01-28 DIAGNOSIS — M6281 Muscle weakness (generalized): Secondary | ICD-10-CM | POA: Diagnosis not present

## 2023-01-28 DIAGNOSIS — M810 Age-related osteoporosis without current pathological fracture: Secondary | ICD-10-CM | POA: Diagnosis not present

## 2023-01-28 DIAGNOSIS — M542 Cervicalgia: Secondary | ICD-10-CM | POA: Diagnosis not present

## 2023-01-28 NOTE — Therapy (Signed)
OUTPATIENT PHYSICAL THERAPY THORACOLUMBAR TREATMENT   Patient Name: Sharon Logan MRN: [id number] DOB:[birth year], 67 y.o., female Today's Date: 11/03/2022     END OF SESSION:  PT End of Session - 01/28/23 1223     Visit Number 16    Authorization - Visit Number 15    Authorization - Number of Visits 24    PT Start Time 1146    PT Stop Time 1222    PT Time Calculation (min) 36 min    Activity Tolerance Patient tolerated treatment well    Behavior During Therapy WFL for tasks assessed/performed                        Past Medical History:  Diagnosis Date   Allergic rhinitis    Anxiety    Arthritis    Cancer (HCC)    skin   Chronic pain    Chronically dry eyes    Depression    Fibromyalgia    GERD (gastroesophageal reflux disease)    Headache    Osteoporosis    Sleep apnea    Stroke (HCC)    from gabapenting per pt no deficits   Vitamin D deficiency    Wears glasses    Past Surgical History:  Procedure Laterality Date   ABDOMINAL HYSTERECTOMY     BACK SURGERY     BUNIONECTOMY Bilateral    EYE SURGERY     laser surgery   NASAL SINUS SURGERY     x2   REVERSE SHOULDER ARTHROPLASTY Right 07/03/2022   Procedure: REVERSE SHOULDER ARTHROPLASTY;  Surgeon: Beverely Low, MD;  Location: [facility];  Service: Orthopedics;  Laterality: Right;  120 min choice with interscalene block   SACROILIAC JOINT FUSION Bilateral 09/12/2020   Procedure: REMOVAL OF BILATERAL PELVIC SCREWS;  Surgeon: Estill Bamberg, MD;  Location: [facility];  Service: Orthopedics;  Laterality: Bilateral;   SKIN CANCER EXCISION     face x3   TUBAL LIGATION     Patient Active Problem List   Diagnosis Date Noted   S/P shoulder replacement, right 07/03/2022   Allergic contact dermatitis due to other agents 04/13/2022   S/P lumbar microdiscectomy 04/30/2021   S/P hardware removal 09/12/2020   Radiculopathy 06/29/2019   Cervical radiculopathy 08/15/2018   Chronic sinusitis 07/27/2018    Constipation 07/27/2018   Chronic bilateral low back pain with bilateral sciatica 07/01/2018   Disorder of bone and cartilage 04/04/2018   Esophageal reflux 04/04/2018   Other and unspecified hyperlipidemia 04/04/2018   Other malaise and fatigue 04/04/2018   Pain in joint involving ankle and foot 04/04/2018   Leg pain 04/04/2018   Posttraumatic stress disorder 04/04/2018   Raynaud's phenomenon 04/04/2018   Skin sensation disturbance 04/04/2018   Sleep disturbance 04/04/2018   Vitamin D deficiency 04/04/2018   Myoclonus, segmental 10/18/2017   Adjacent segment disease with spinal stenosis 10/04/2017   DDD (degenerative disc disease), lumbar 10/30/2016   Spondylolisthesis of lumbar region 08/21/2016   Overweight with body mass index (BMI) 25.0-29.9 07/22/2016   Capsulitis of metatarsophalangeal (MTP) joint of right foot 03/23/2016   Lumbar spinal stenosis 12/05/2015   Migraine 09/13/2012   Other allergic rhinitis 09/13/2012   Neuropathy 09/13/2012    PCP: Moshe Cipro NP  REFERRING PROVIDER: Moshe Cipro NP  REFERRING DIAG:  [phone] (ICD-10-CM) - Spinal stenosis, lumbar region without neurogenic claudication  M79.7 (ICD-10-CM) - Fibromyalgia  G89.4 (ICD-10-CM) - Chronic pain syndrome  M81.0 (ICD-10-CM) - Age-related osteoporosis without current  pathological fracture    Rationale for Evaluation and Treatment: Rehabilitation  THERAPY DIAG:  Back pain, weakness, osteoporosis  ONSET DATE: 06/15/2022 (exacerbation of chronic problem)  SUBJECTIVE:                                                                                                                                                                                           SUBJECTIVE STATEMENT: DN helped my pain for 2 days.  I am 70% better overall.  I am still having Lt UE/quarter pain.     PERTINENT HISTORY:   spinal cord stimulator next week (May) no twisting, no bending until end of July but sees MD  early July; no aquatic PT for 4 months I had an ablation several months ago and I'll never do it again  Reverse TSR. Surgery on 07-03-22   L3-4 andL5-S1 PLIF 2023; fibromyalgia, chronic pain,  OA,  osteoporosis (unsure what areas are impacted) Depression Osteoporosis: T score: -2.9 proximal femur, forearm -1.6 PAIN:  PAIN:  Are you having pain? yes NPRS scale: 4-5/10 Pain location: between the shoulders (Lt>Rt) Aggravating factors: stood too long, walk extra, extra housecleaning (energy depleting) Relieving factors: putting feet up with sitting or lying down for 20 min  PRECAUTIONS: None  WEIGHT BEARING RESTRICTIONS: No  FALLS:  Has patient fallen in last 6 months? No my balance could be better though  LIVING ENVIRONMENT: Lives in: House/apartment Stairs: 1 step to enter   OCCUPATION: retired Enjoys walking, reading, listen to music, tend to plants on deck with hose Has a black lab  PLOF: Independent  PATIENT GOALS: 4 day bus trip in October; I'd like to walk in the [place] and feel mobile   NEXT MD VISIT: every 6 weeks  OBJECTIVE:   DIAGNOSTIC FINDINGS:  Pt will look for Dexa scan results in her records   PATIENT SURVEYS:  Modified Oswestry 30% moderate self perceived disability 12/28/22: Modified Owestry: 24% perceived disability  ABC: 73.125% confidence  01/28/23: ODI 26% perceived disability ABC: 85% confidence    COGNITION: Overall cognitive status: Within functional limits for tasks assessed      MUSCLE LENGTH: Dec bil hip flexor lengths: 8 degrees bil of hip extension  POSTURE: decreased lumbar lordosis and increased thoracic kyphosis   LUMBAR ROM:  Uses a squat method to reach floor with ease; unable to lie prone  AROM eval  Flexion WFLs  Extension 5 degrees  Right lateral flexion   Left lateral flexion   Right rotation   Left rotation    (Blank rows = not tested)  TRUNK STRENGTH:  Decreased activation of transverse abdominus muscles;  abdominals 4-/5; decreased activation of lumbar multifidi; trunk extensors 4-/5 6/18:  abdominals and trunk extensors grossly 4/5  LOWER EXTREMITY ROM:   WFLs  LOWER EXTREMITY MMT:  SLS left 3 sec; right SLS 1 sec with pelvic drop;  quadruped bird dog pelvic drop and compensatory rotation right/left Frequent HS cramps during movement testing  MMT Right eval Left eval 6/18 Right 01/22/23 Left  01/22/23  Hip flexion 4 4 4+ 5 5  Hip extension 4- 4- 4 5 5   Hip abduction 3+ 3+ 4- 5 5  Hip adduction    5 4+  Hip internal rotation       Hip external rotation 4- 4- 4 5 4+  Knee flexion 4 4 4 5 5   Knee extension 4 4 4 5  4+  Ankle dorsiflexion       Ankle plantarflexion       Ankle inversion       Ankle eversion        (Blank rows = not tested)  UPPER BACK STRENGTH:  mid traps  L 5  R 5 , Low traps L 3+  R, Rhomboids  L 4+/5  R  4+/5   FUNCTIONAL TESTS:  Able to rise from standard height chair without UE assist  GAIT: 11/12/22: : 1,303 feet Comments: no assistive device needed  TODAY'S TREATMENT:        01/28/23: Nustep L6 x 10  min PT present to discuss progress Seated with ball behind back X to V with looking up to encourage thoracic extension 2 x 10 Sidestepping hurdles at barre x 4 laps Forward step to over hurdles without UE support  step-to and reciprocal x 4 laps ea -SBA by PT for safety Supine green UE band 2x10 of horiz abduction, ER, D2 flexion x 10 ea Leg press: 75# bil 2x15, single leg 35# 2x10 bil each  01/25/23: Nustep L6 x8  min PT present to discuss progress Seated X to V with looking up to encourage thoracic extension 2 x 10 Sidestepping hurdles at counter top Leg press: 75# bil 2x15, single leg 35# 2x10 bil each Forward step to over hurdles without UE support -SBA by PT for safety Trigger Point Dry-Needling  Treatment instructions: Expect mild to moderate muscle soreness. S/S of pneumothorax if dry needled over a lung field, and to seek immediate medical  attention should they occur. Patient verbalized understanding of these instructions and education.  Patient Consent Given: Yes Education handout provided: Yes Muscles treated: Lt thoracic multifidi and rhomboid, subscapularis Treatment response/outcome: Utilized skilled palpation to identify trigger points.  During dry needling able to palpate muscle twitch and muscle elongation  Elongation and release to  Skilled palpation and monitoring by PT during dry needling                                                                                  01/22/23: Nustep L6 x 10  min PT present to discuss progress Seated with ball behind back X to V with looking up to encourage thoracic extension 2 x 10 Sidestepping hurdles at barre x 4 laps Forward step to over hurdles without UE support  step-to and reciprocal x  4 laps ea -SBA by PT for safety Sidestepping at barre with red band around thighs x 6 laps - no UE assist Standing 3 way hip (post clock) 2 x 10 B Red band at thighs Standing bil shoulder extension and row 2x10 each Supine green UE band 2x10 of horiz abduction, ER, D2 flexion x 10 ea Leg press: 75# bil 2x15, single leg 35# 2x10 bil each Seated hip IR with ball b/w knees x 20                                           PATIENT EDUCATION:  Education details: Educated patient on anatomy and physiology of current symptoms, prognosis, plan of care as well as initial self care strategies to promote recovery  Person educated: Patient Education method: Explanation Education comprehension: verbalized understanding  HOME EXERCISE PROGRAM: Access Code: [email or web address] URL: [email or web address] Date: 01/22/2023 Prepared by: Raynelle Fanning  Exercises - Hip Flexor Stretch at Edge of Bed  - 1 x daily - 7 x weekly - 1 sets - 2-3 reps - 30 hold - Standing Row with Anchored Resistance  - 1 x daily - 7 x weekly - 3 sets - 10 reps - Standing Shoulder Extension with Resistance  - 1 x daily - 7 x weekly - 3  sets - 10 reps - Sit to Stand Without Arm Support  - 1 x daily - 7 x weekly - 2 sets - 10 reps - Bird Dog  - 1 x daily - 7 x weekly - 1 sets - 5 reps - Seated Hamstring Stretch  - 1 x daily - 7 x weekly - 1 sets - 2-3 reps - 20-30 sec hold - Standing March with Counter Support  - 1 x daily - 7 x weekly - 1 sets - 10 reps - Seated Thoracic Extension Arms Overhead  - 1 x daily - 3-4 x weekly - 1-2 sets - 10 reps - 2-3 sec hold - Standing Hip Abduction with Unilateral Counter Support  - 1 x daily - 7 x weekly - 3 sets - 5 reps - 10 seconds hold - Standing Hip Extension with Counter Support  - 1 x daily - 7 x weekly - 3 sets - 10 reps - Single Leg Heel Raise with Counter Support  - 1 x daily - 7 x weekly - 1 sets - 10 reps - Forward T with Counter Support  - 1-2 x daily - 7 x weekly - 2 sets - 5 reps - Side Stepping with Resistance at Ankles and Counter Support  - 2 x daily - 7 x weekly - 2 sets - 10 reps - Supine Cervical Retraction with Towel  - 1 x daily - 7 x weekly - 1 sets - 5 reps - 10 hold - Supine Shoulder Horizontal Abduction with Resistance  - 1 x daily - 7 x weekly - 1 sets - 10 reps - Supine Shoulder External Rotation with Resistance  - 1 x daily - 7 x weekly - 1 sets - 10 reps - Supine PNF D2 Flexion with Resistance  - 1 x daily - 7 x weekly - 1 sets - 10 reps - Standing Row with Anchored Resistance  - 1 x daily - 7 x weekly - 1 sets - 10 reps - Standing Shoulder Extension with Resistance  - 1 x daily - 7 x weekly - 1  sets - 10 reps - Seated Hip Internal Rotation Active ROM  - 1 x daily - 7 x weekly - 2-3 sets - 10 reps  ASSESSMENT:  CLINICAL IMPRESSION: Pt had 2 days of reduced pain after DN last session.  Pain has returned to 4-5/10 in the Lt upper quadrant.  She has MRI scheduled this week.    Pt continues to work on endurance, strength and balance. ABC balance scale is improved to 85%.  No change in ODI for low back function.  All other goals have been met.   PT provided  supervision and provided verbal cues for alignment and technique of movement.     OBJECTIVE IMPAIRMENTS: decreased activity tolerance, decreased strength, impaired perceived functional ability, and pain.   ACTIVITY LIMITATIONS: carrying, lifting, bending, standing, and locomotion level  PARTICIPATION LIMITATIONS: cleaning, community activity, and yard work  PERSONAL FACTORS: 3+ comorbidities: multi regions affected, fibromyalgia, time since onset , osteoporosis are also affecting patient's functional outcome.   REHAB POTENTIAL: Good  CLINICAL DECISION MAKING: Evolving/moderate complexity  EVALUATION COMPLEXITY: Moderate   GOALS: Goals reviewed with patient? Yes  SHORT TERM GOALS: Target date: 12/01/2022    The patient will demonstrate knowledge of basic self care strategies, osteoporosis education and exercises to promote health Baseline: Goal status: met 6/18 2.  The patient will have improved bil hip strength to at least 4/5 needed for standing, walking longer distances and descending stairs at home and in the community  Baseline:  Goal status: met 6/18  3.  The patient will have improved trunk flexor and extensor muscle strength to at least 4/5 needed for lifting medium weight objects such as grocery bags Baseline:  Goal status: met 6/18 4.  Patient will demo hip hinge and squat needed for watering her plants  Baseline:  Goal status: met 6/20    LONG TERM GOALS: Target date: 01/29/23    The patient will be independent in a safe self progression of a home exercise program to promote further recovery of function  Baseline:  Goal status: MET  2.  The patient will report a 50% improvement in pain levels with functional activities which are currently difficult including standing and walking for longer periods of time and housework Baseline:  85-90% (12/28/22) Goal status: MET  3.  The patient will have improved trunk flexor and extensor muscle strength to at least 4+/5  needed for lifting medium weight objects such as  luggage for bus trip [place] Baseline:  Goal status: MET 01/22/23  4.  The patient will have improved gait stamina and speed needed to ambulate 800 feet in 6 minutes  Baseline: 1303 (11/12/22) Goal status: MET  5.  Modified Oswestry improved < or = to 10% indicating improved function with less pain Baseline: 26% (01/28/23) Goal status: NOT MET  6. Improve ABC to > or = to 81% self confidence for balance  Baseline: 85% (01/28/23)  Goal Status: MET    PLAN:  PHYSICAL THERAPY DISCHARGE SUMMARY  Visits from Start of Care: 16  Current functional level related to goals / functional outcomes: Pt reports 70% overall improvement in balance and pain since the start of care.    Remaining deficits: See above for goal status and remaining deficits    Education / Equipment: HEP, balance progression    Patient agrees to discharge. Patient goals were partially met. Patient is being discharged due to being pleased with the current functional level.   Lorrene Reid, PT 01/28/23 12:28  PM

## 2023-01-29 ENCOUNTER — Telehealth: Payer: Self-pay

## 2023-01-29 ENCOUNTER — Telehealth: Payer: Self-pay | Admitting: Psychiatry

## 2023-01-29 NOTE — Telephone Encounter (Signed)
Prior Approval received for Eszopiclone 2 mg tablets effective through 01/28/2024 with [employer].

## 2023-01-29 NOTE — Telephone Encounter (Signed)
[employer] approved ESZOPICLONE 2mg  tablet , approved thru 06/15/2023

## 2023-01-29 NOTE — Telephone Encounter (Signed)
Initiated PA for ESZOPICLONE 2 mg. Pt has 2 insurances listed will submit with both

## 2023-01-29 NOTE — Telephone Encounter (Signed)
Faxed more information as well to [facility]

## 2023-02-02 DIAGNOSIS — M722 Plantar fascial fibromatosis: Secondary | ICD-10-CM | POA: Diagnosis not present

## 2023-02-04 DIAGNOSIS — M542 Cervicalgia: Secondary | ICD-10-CM | POA: Diagnosis not present

## 2023-02-08 ENCOUNTER — Ambulatory Visit (INDEPENDENT_AMBULATORY_CARE_PROVIDER_SITE_OTHER): Payer: No Typology Code available for payment source | Admitting: Psychiatry

## 2023-02-08 ENCOUNTER — Encounter: Payer: Self-pay | Admitting: Psychiatry

## 2023-02-08 DIAGNOSIS — G4700 Insomnia, unspecified: Secondary | ICD-10-CM | POA: Diagnosis not present

## 2023-02-08 DIAGNOSIS — F331 Major depressive disorder, recurrent, moderate: Secondary | ICD-10-CM | POA: Diagnosis not present

## 2023-02-08 DIAGNOSIS — F4310 Post-traumatic stress disorder, unspecified: Secondary | ICD-10-CM

## 2023-02-08 MED ORDER — ESZOPICLONE 2 MG PO TABS
2.0000 mg | ORAL_TABLET | Freq: Every evening | ORAL | 2 refills | Status: DC | PRN
Start: 2023-02-26 — End: 2023-04-19

## 2023-02-08 MED ORDER — ESCITALOPRAM OXALATE 5 MG PO TABS
ORAL_TABLET | ORAL | 0 refills | Status: AC
Start: 2023-02-08 — End: ?

## 2023-02-08 NOTE — Progress Notes (Unsigned)
Sharon Logan [id number] [birth year] 67 y.o.  Subjective:   Patient ID:  Sharon Logan is a 67 y.o. (DOB 10-05-1955) female.  Chief Complaint: No chief complaint on file.   Sharon Logan presents to the office today for follow-up of depression, anxiety, and insomnia.   She reports that she tolerates Lexapro 5 mg and then felt "drugged" when she increased to 10 mg daily and was very fatigued and foggy.   She reports that her mood has been "overall, good." She reports there are times she has felt slightly depressed at times. She reports Lexapro seems to be helping with her anxiety. She has noticed some anxiety at times. She reports that sleep has improved with Lunesta and she falls asleep quickly. She reports that she is waking up once a night instead of 2-3 times a night. She has been training her dog not to get on her bed while she is sleeping. She reports that her dog may have been disrupting her sleep some in the past. She reports that she is noticing some improvement in energy. Motivation is improving and remains lower than she would like. She reports that she started to learn how to do pickleball and has started to do [employer] periodically. Appetite has been "the same." She reports that she did not enjoy food at a restaurant she normally enjoys. She reports poor concentration and that this has been long-standing. She reports that concentration is better at times. Continues to read. Denies SI.   She has been doing EMDR therapy with therapist Toniann Fail).   Past Medication Trials: Cymbalta- Started after back surgery. Has made her tired, even when reduced to 30 mg. Was having to take 2 hour naps. May have helped slightly with pain. Had HA, Constipation, Nausea Savella- muscle aches, rhabdomyolysis Effexor-HA, helped with excessive sweating.  Pristiq- May have increased depression. Prozac- Constipation. Causes her to feel cold.  Celexa- Worsening depression at 20 mg dose Lexapro- "My  anti-depressant of choice" and gives her energy. Was taking 2-3 years prior to surgery. Re-started one week ago. Has taken 10 mg and 20 mg doses in the past. Thinks it may have helped with pain in the past. Zoloft- worsening mood Paxil Viibryd- Felt depression was more "heavy." Wellbutrin- Did not cause HA's. Tolerated well and was effective for depression. Auvelity- "felt drugged" and was forgetting appointments.  Amitriptyline- Did not cause HA's. Weight gain. Effective.  Nortriptyline- Did not cause HA's. Effective. Lamictal- Rash Gabapentin -Rhabomyolysis Lyrica- Adverse reaction Buspar- Helpful for anxiety in the past. Well-tolerated. Tried again and had severe anxiety.  Trazodone- helped with sleep initiation but sleep was not restful. Severe dry mouth. Ambien- Parasomnia Lunesta- Tolerated, effective at 2 mg dose Remeron- Dry eyes, internal restlessness. Stomach discomfort.  Clonidine Topamax- ineffective for headaches. Caused cognitive side effects.  Hydroxyzine- Initially helped with anxiety and sleep initiation, then no longer as helpful     PHQ2-9    Flowsheet Row Nutrition from 10/26/2022 in [facility] at [facility] Visit from 09/28/2022 in [facility] Medicine [facility] Rehabilitation Office Visit from 07/27/2022 in [facility] Physical Medicine & Rehabilitation Office Visit from 05/25/2022 in [facility] Physical Medicine & Rehabilitation Office Visit from 10/06/2021 in [facility] Physical Medicine & Rehabilitation  PHQ-2 Total Score 0 0 2 0 0      Flowsheet Row Admission (Discharged) from 07/03/2022 in [facility] Pre-Admission Testing 60 from 06/18/2022 in [facility]  TESTING ED from 10/12/2021 in [facility] Emergency Department at [facility]  C-SSRS RISK CATEGORY No Risk No Risk No Risk        Review of Systems:  Review of Systems  Musculoskeletal:  Positive for  arthralgias, back pain and myalgias. Negative for gait problem.  Psychiatric/Behavioral:         Please refer to Sharon    Medications: I have reviewed the patient's current medications.  Current Outpatient Medications  Medication Sig Dispense Refill   acetaminophen (TYLENOL) 325 MG tablet Take 650 mg by mouth every 6 (six) hours as needed for moderate pain.     amLODipine (NORVASC) 5 MG tablet Take 5 mg by mouth daily.     b complex vitamins capsule Take 1 capsule by mouth daily.     baclofen (LIORESAL) 10 MG tablet Take 10 mg by mouth 3 (three) times daily. Reports taking 2-3 times daily     BIOTIN PO Take by mouth.     Carboxymethylcellulose Sodium (THERATEARS OP) Place 1 drop into both eyes 4 (four) times daily as needed (dry eyes). (Patient not taking: Reported on 10/27/2022)     cetirizine (ZYRTEC) 10 MG tablet Take 10 mg by mouth daily as needed for allergies.     Cholecalciferol (VITAMIN D3) 25 MCG (1000 UT) CAPS Take by mouth.     diclofenac Sodium (VOLTAREN) 1 % GEL Apply 2 g topically daily as needed (pain).     EPINEPHrine 0.3 mg/0.3 mL IJ SOAJ injection Inject 0.3 mg into the muscle as needed for anaphylaxis.     escitalopram (LEXAPRO) 10 MG tablet Take 1/2 tablet daily for one week, then increase to 1 tablet daily 30 tablet 1   eszopiclone (LUNESTA) 2 MG TABS tablet Take 1 tablet (2 mg total) by mouth at bedtime as needed for sleep. Take immediately before bedtime 30 tablet 0   fluticasone (FLONASE) 50 MCG/ACT nasal spray Place 2 sprays into both nostrils 2 (two) times daily as needed for allergies or rhinitis.     lidocaine (LIDODERM) 5 % Place 1 patch onto the skin daily.     magnesium 30 MG tablet Take 30 mg by mouth at bedtime.     methotrexate (RHEUMATREX) 2.5 MG tablet Take by mouth.     mirtazapine (REMERON) 15 MG tablet TAKE 1/2-1 TABLET BY MOUTH AT BEDTIME 90 tablet 0   rizatriptan (MAXALT) 10 MG tablet Take 10 mg by mouth daily as needed for migraine.      saccharomyces  boulardii (FLORASTOR) 250 MG capsule Take 250 mg by mouth daily.     sulindac (CLINORIL) 150 MG tablet Take 150 mg by mouth 2 (two) times daily. (Patient not taking: Reported on 10/26/2022)     XTAMPZA ER 13.5 MG C12A Take 13.5 mg by mouth every 12 (twelve) hours.     No current facility-administered medications for this visit.    Medication Side Effects: Other: Increased fatigue at higher dose of Lexapro  Allergies:  Allergies  Allergen Reactions   Gabapentin Other (See Comments)    Delirium, stroke like symptoms  Other Reaction(s): Myalgia, Other  Has stroke symptoms when taking this with percocet.     Other reaction(s): Delirium    Delirium, stroke like symptoms   Amitriptyline Other (See Comments)   Amlodipine Other (See Comments)    headaches   Polymyxin B Other (See Comments)    Eyes go blood red    Pregabalin Itching and Rash    Itchy red  rash on chest  Other Reaction(s): other   Zolpidem Rash    "sleep walk"    Cymbalta [Duloxetine Hcl] Other (See Comments)    Headaches, constipation   Fluoxetine Other (See Comments)    CONSTIPATION    Lamotrigine Rash   Other Other (See Comments)    OTOBIOTIC > RED EYES    Past Medical History:  Diagnosis Date   Allergic rhinitis    Anxiety    Arthritis    Cancer (HCC)    skin   Chronic pain    Chronically dry eyes    Depression    Fibromyalgia    GERD (gastroesophageal reflux disease)    Headache    Osteoporosis    Sleep apnea    Stroke (HCC)    from gabapenting per pt no deficits   Vitamin D deficiency    Wears glasses     Past Medical History, Surgical history, Social history, and Family history were reviewed and updated as appropriate.   Please see review of systems for further details on the patient's review from today.   Objective:   Physical Exam:  There were no vitals taken for this visit.  Physical Exam  Lab Review:     Component Value Date/Time   NA 140 07/04/2022 0407   NA 142  06/03/2021 1623   K 3.9 07/04/2022 0407   CL 109 07/04/2022 0407   CO2 26 07/04/2022 0407   GLUCOSE 95 07/04/2022 0407   BUN 15 07/04/2022 0407   BUN 21 06/03/2021 1623   CREATININE 0.73 07/04/2022 0407   CALCIUM 8.1 (L) 07/04/2022 0407   PROT 7.4 04/21/2021 0910   ALBUMIN 3.8 04/21/2021 0910   AST 26 04/21/2021 0910   ALT 21 04/21/2021 0910   ALKPHOS 120 04/21/2021 0910   BILITOT 0.7 04/21/2021 0910   GFRNONAA >60 07/04/2022 0407   GFRAA >60 06/26/2019 1008       Component Value Date/Time   WBC 7.0 06/18/2022 1036   RBC 4.71 06/18/2022 1036   HGB 10.8 (L) 07/04/2022 0407   HGB 12.1 06/03/2021 1622   HCT 32.8 (L) 07/04/2022 0407   HCT 37.6 06/03/2021 1622   PLT 294 06/18/2022 1036   PLT 336 06/03/2021 1622   MCV 94.5 06/18/2022 1036   MCV 92 06/03/2021 1622   MCH 29.9 06/18/2022 1036   MCHC 31.7 06/18/2022 1036   RDW 12.8 06/18/2022 1036   RDW 12.8 06/03/2021 1622   LYMPHSABS 2.0 06/03/2021 1622   MONOABS 0.5 04/21/2021 0910   EOSABS 0.1 06/03/2021 1622   BASOSABS 0.0 06/03/2021 1622    No results found for: "POCLITH", "LITHIUM"   No results found for: "PHENYTOIN", "PHENOBARB", "VALPROATE", "CBMZ"   .res Assessment: Plan:    There are no diagnoses linked to this encounter.   Please see After Visit Summary for patient specific instructions.  Future Appointments  Date Time Provider Department Center  02/18/2023 10:45 AM Raulkar, Drema Pry, MD [facility]  02/22/2023 10:45 AM Louann Sjogren, DPM [facility]  03/01/2023 11:15 AM Derrell Lolling, Rolin Barry, RD [facility]  10/04/2023 10:30 AM Drema Dallas, DO [facility] None    No orders of the defined types were placed in this encounter.   -------------------------------

## 2023-02-16 ENCOUNTER — Encounter: Payer: Medicare HMO | Admitting: Rehabilitative and Restorative Service Providers"

## 2023-02-16 DIAGNOSIS — M0609 Rheumatoid arthritis without rheumatoid factor, multiple sites: Secondary | ICD-10-CM | POA: Diagnosis not present

## 2023-02-16 DIAGNOSIS — Z79899 Other long term (current) drug therapy: Secondary | ICD-10-CM | POA: Diagnosis not present

## 2023-02-18 ENCOUNTER — Encounter: Payer: Medicare HMO | Attending: Physical Medicine and Rehabilitation | Admitting: Physical Medicine and Rehabilitation

## 2023-02-18 ENCOUNTER — Encounter: Payer: Self-pay | Admitting: Physical Medicine and Rehabilitation

## 2023-02-18 VITALS — BP 132/84 | HR 55 | Ht 63.0 in | Wt 137.0 lb

## 2023-02-18 DIAGNOSIS — M7918 Myalgia, other site: Secondary | ICD-10-CM

## 2023-02-18 MED ORDER — SODIUM CHLORIDE (PF) 0.9 % IJ SOLN
2.0000 mL | Freq: Once | INTRAMUSCULAR | Status: AC
Start: 2023-02-18 — End: 2023-02-18
  Administered 2023-02-18: 2 mL

## 2023-02-18 MED ORDER — LIDOCAINE HCL 1 % IJ SOLN
3.0000 mL | Freq: Once | INTRAMUSCULAR | Status: AC
Start: 2023-02-18 — End: 2023-02-18
  Administered 2023-02-18: 3 mL

## 2023-02-19 DIAGNOSIS — M0609 Rheumatoid arthritis without rheumatoid factor, multiple sites: Secondary | ICD-10-CM | POA: Diagnosis not present

## 2023-02-19 DIAGNOSIS — M159 Polyosteoarthritis, unspecified: Secondary | ICD-10-CM | POA: Diagnosis not present

## 2023-02-19 DIAGNOSIS — Z79899 Other long term (current) drug therapy: Secondary | ICD-10-CM | POA: Diagnosis not present

## 2023-02-19 NOTE — Progress Notes (Signed)

## 2023-02-21 DIAGNOSIS — M79671 Pain in right foot: Secondary | ICD-10-CM | POA: Diagnosis not present

## 2023-02-21 DIAGNOSIS — R9389 Abnormal findings on diagnostic imaging of other specified body structures: Secondary | ICD-10-CM | POA: Diagnosis not present

## 2023-02-22 ENCOUNTER — Ambulatory Visit: Payer: Medicare HMO | Admitting: Podiatry

## 2023-02-23 DIAGNOSIS — M419 Scoliosis, unspecified: Secondary | ICD-10-CM | POA: Diagnosis not present

## 2023-02-23 DIAGNOSIS — Z9682 Presence of neurostimulator: Secondary | ICD-10-CM | POA: Diagnosis not present

## 2023-02-23 DIAGNOSIS — D1809 Hemangioma of other sites: Secondary | ICD-10-CM | POA: Diagnosis not present

## 2023-02-23 DIAGNOSIS — M79601 Pain in right arm: Secondary | ICD-10-CM | POA: Diagnosis not present

## 2023-02-23 DIAGNOSIS — M79602 Pain in left arm: Secondary | ICD-10-CM | POA: Diagnosis not present

## 2023-02-23 DIAGNOSIS — M549 Dorsalgia, unspecified: Secondary | ICD-10-CM | POA: Diagnosis not present

## 2023-02-23 DIAGNOSIS — M546 Pain in thoracic spine: Secondary | ICD-10-CM | POA: Diagnosis not present

## 2023-02-23 DIAGNOSIS — M5136 Other intervertebral disc degeneration, lumbar region: Secondary | ICD-10-CM | POA: Diagnosis not present

## 2023-02-24 ENCOUNTER — Telehealth: Payer: Self-pay | Admitting: Psychiatry

## 2023-02-24 NOTE — Telephone Encounter (Signed)
Patient lvm requesting a refill on the Lunesta. Appointment scheduled for 03/22/23.  Contact [phone]

## 2023-02-24 NOTE — Telephone Encounter (Signed)
RF is due 9/13, has a RF already at the pharmacy.

## 2023-02-25 DIAGNOSIS — M542 Cervicalgia: Secondary | ICD-10-CM | POA: Diagnosis not present

## 2023-02-28 DIAGNOSIS — M79671 Pain in right foot: Secondary | ICD-10-CM | POA: Diagnosis not present

## 2023-03-01 ENCOUNTER — Encounter: Payer: Self-pay | Admitting: Dietician

## 2023-03-01 ENCOUNTER — Encounter: Payer: Medicare HMO | Attending: Family Medicine | Admitting: Dietician

## 2023-03-01 DIAGNOSIS — Z713 Dietary counseling and surveillance: Secondary | ICD-10-CM | POA: Insufficient documentation

## 2023-03-01 NOTE — Patient Instructions (Signed)
Great job on changes made! Continue to stay active. Include a plant based protein with each meal. Continue to avoid foods that cause symptoms such as limiting nightshade vegetables.

## 2023-03-01 NOTE — Progress Notes (Signed)
  Medical Nutrition Therapy  Appointment Start time:  1140  Appointment End time:  1200  Patient is here today alone.  She was last seen by this RD 10/26/2022. Since reducing the nightshade vegetables, her pain has decreased.   Moving towards more plant based protein has also helped her pain. She is walking daily and going to the gym 2-3 times per week.  She competed PT. She states that balance and strength have improved. Feels that she is losing her taste buds at times. Switched mainly to almond milk.  Initial visit 10/26/2022:  Primary concerns today: She would like to know what foods contribute to inflammation. She states that she feels tomatoes cause her inflammation - increased aching. Citrus causes mild itching and discomfort in her mouth and increased raw lips. Dairy causes stomach to hurt. Recently saw a speech therapist.  Muscles are week and should have fluids with meals to help food pass more easily.  No aspiration risk, just problems swallowing.  Referral diagnosis: Dysphagia and nutritional counseling for better food choices Preferred learning style: no preference indicated Learning readiness: ready, change in progress   NUTRITION ASSESSMENT   Anthropometrics  63" 132 lbs 10/26/2022 No recent weight changes   Clinical Medical Hx: depression, GERD, osteoporosis, CVA, OSA - uses c-pap, chronic pain, vitamin D deficiency Medications: see list Labs: eGFR 78, A1C 5.7% 08/2022, Vitamin D 48 04/10/2022, Cholesterol 241, LDL 159, HDL 58, Triglyceride 119 04/11/2023 Notable Signs/Symptoms: chronic pain (nerve)  Lifestyle & Dietary Hx Patient lives alone. Widow. Her daughter lives local and son in the [place] in [place]. She served 2 years in [place]. She is retired from the post office. She has moved to [facility] from [place] 10 years ago.  Estimated daily fluid intake:  Supplements: probiotic, magnesium, Vitamin D3K2, Balance of nature, vitamin B-complex Sleep: 6 hours due to  chronic pain Stress / self-care: moderate - due to chronic pain (nerve pain and states that she has qualified for a spinal cord stimulator) Current average weekly physical activity: walks dog twice daily (20-30 minutes each)  24-Hr Dietary Recall First Meal: 2 slices toast, PB or almond butter, fruit, tea  Snack: occasional almonds or other nuts and occasional fruit Second Meal: Salad without meat, nuts, cranberries, occasional beans Snack: [employer] almond cookie Third Meal: soup OR pasta OR tacos Snack: actvia yogurt and nuts Beverages: water, black tea, unsweetened tea, occasional Dr. Reino Kent   NUTRITION DIAGNOSIS  NB-1.1 Food and nutrition-related knowledge deficit As related to balance of protein, carbohydrates and fat.  As evidenced by diet hx and patient report.   NUTRITION INTERVENTION  Nutrition education (E-1) on the following topics:  Reviewed meal intake and how she is feeling Reviewed her preference for simple meal planning Encouraged her to continue to stay active Reviewed plant based sources of protein  Handouts Provided Include - initial visit [employer] developer of Lifestyle Medicine) packet  Learning Style & Readiness for Change Teaching method utilized: Visual & Auditory  Demonstrated degree of understanding via: Teach Back  Barriers to learning/adherence to lifestyle change: pain  Goals Established by Pt Great job on changes made! Continue to stay active. Include a plant based protein with each meal. Continue to avoid foods that cause symptoms such as limiting nightshade vegetables.  MONITORING & EVALUATION Dietary intake, weekly physical activity in 2 months  Next Steps  Patient is to call for questions.

## 2023-03-05 DIAGNOSIS — M47812 Spondylosis without myelopathy or radiculopathy, cervical region: Secondary | ICD-10-CM | POA: Diagnosis not present

## 2023-03-05 DIAGNOSIS — M25551 Pain in right hip: Secondary | ICD-10-CM | POA: Diagnosis not present

## 2023-03-05 DIAGNOSIS — M533 Sacrococcygeal disorders, not elsewhere classified: Secondary | ICD-10-CM | POA: Diagnosis not present

## 2023-03-05 DIAGNOSIS — G894 Chronic pain syndrome: Secondary | ICD-10-CM | POA: Diagnosis not present

## 2023-03-05 DIAGNOSIS — M961 Postlaminectomy syndrome, not elsewhere classified: Secondary | ICD-10-CM | POA: Diagnosis not present

## 2023-03-05 DIAGNOSIS — M542 Cervicalgia: Secondary | ICD-10-CM | POA: Diagnosis not present

## 2023-03-05 DIAGNOSIS — M5136 Other intervertebral disc degeneration, lumbar region: Secondary | ICD-10-CM | POA: Diagnosis not present

## 2023-03-07 DIAGNOSIS — M542 Cervicalgia: Secondary | ICD-10-CM | POA: Diagnosis not present

## 2023-03-08 ENCOUNTER — Ambulatory Visit: Payer: Medicare HMO | Admitting: Podiatry

## 2023-03-08 DIAGNOSIS — M48061 Spinal stenosis, lumbar region without neurogenic claudication: Secondary | ICD-10-CM | POA: Diagnosis not present

## 2023-03-08 DIAGNOSIS — M797 Fibromyalgia: Secondary | ICD-10-CM | POA: Diagnosis not present

## 2023-03-08 DIAGNOSIS — G8929 Other chronic pain: Secondary | ICD-10-CM | POA: Diagnosis not present

## 2023-03-08 DIAGNOSIS — M546 Pain in thoracic spine: Secondary | ICD-10-CM | POA: Diagnosis not present

## 2023-03-10 DIAGNOSIS — M79602 Pain in left arm: Secondary | ICD-10-CM | POA: Diagnosis not present

## 2023-03-10 DIAGNOSIS — M79604 Pain in right leg: Secondary | ICD-10-CM | POA: Diagnosis not present

## 2023-03-10 DIAGNOSIS — M79601 Pain in right arm: Secondary | ICD-10-CM | POA: Diagnosis not present

## 2023-03-10 DIAGNOSIS — M545 Low back pain, unspecified: Secondary | ICD-10-CM | POA: Diagnosis not present

## 2023-03-10 DIAGNOSIS — M79605 Pain in left leg: Secondary | ICD-10-CM | POA: Diagnosis not present

## 2023-03-15 DIAGNOSIS — G4733 Obstructive sleep apnea (adult) (pediatric): Secondary | ICD-10-CM | POA: Diagnosis not present

## 2023-03-16 ENCOUNTER — Encounter: Payer: Self-pay | Admitting: Psychiatry

## 2023-03-16 ENCOUNTER — Ambulatory Visit (INDEPENDENT_AMBULATORY_CARE_PROVIDER_SITE_OTHER): Payer: Medicare HMO | Admitting: Psychiatry

## 2023-03-16 ENCOUNTER — Other Ambulatory Visit: Payer: Self-pay | Admitting: Psychiatry

## 2023-03-16 DIAGNOSIS — F4310 Post-traumatic stress disorder, unspecified: Secondary | ICD-10-CM | POA: Diagnosis not present

## 2023-03-16 DIAGNOSIS — F331 Major depressive disorder, recurrent, moderate: Secondary | ICD-10-CM

## 2023-03-16 DIAGNOSIS — G4700 Insomnia, unspecified: Secondary | ICD-10-CM | POA: Diagnosis not present

## 2023-03-16 MED ORDER — ESCITALOPRAM OXALATE 5 MG PO TABS
ORAL_TABLET | ORAL | 0 refills | Status: DC
Start: 2023-03-16 — End: 2023-04-19

## 2023-03-16 MED ORDER — NORTRIPTYLINE HCL 10 MG PO CAPS
10.0000 mg | ORAL_CAPSULE | Freq: Every day | ORAL | 1 refills | Status: DC
Start: 2023-03-16 — End: 2023-04-19

## 2023-03-16 NOTE — Progress Notes (Signed)
Sharon Logan [id number] Nov 19, 1955 67 y.o.  Subjective:   Patient ID:  Sharon Logan is a 67 y.o. (DOB [birth year]) female.  Chief Complaint:  Chief Complaint  Patient presents with   Depression   Anxiety    HPI [name] presents to the office today for follow-up of depression, anxiety, and history of sleep disturbance. She reports that she feels the "need to come off the Lexapro... I get real irritated at the dog. I don't have patience. I have a lot of anxiety in me because of the chronic pain." She reports that pain interferes with her activities and she recently canceled a bus trip with her church due to pain. Motivation and energy are lower.  She reports persistent sad mood. Mood is also irritable and frustrated. She reports having anxiety that manifests physical and with anxious thoughts. Some worry about her health. Appetite is very low. She reports that she is not able to enjoy foods she typically likes. She reports that her concentration "comes and goes." Denies SI.   She reports that Alfonso Patten is helpful for sleep initiation and awakens at least once due to pain.   She had to find a new pain management specialist for insurance reasons. She reports that she has been without any pain medication for about 10 days.   Continues to see therapist for EMDR. She reports that this has been helpful.   Past Medication Trials: Cymbalta- Started after back surgery. Has made her tired, even when reduced to 30 mg. Was having to take 2 hour naps. May have helped slightly with pain. Had HA, Constipation, Nausea Savella- muscle aches, rhabdomyolysis Effexor-HA, helped with excessive sweating.  Pristiq- May have increased depression. Prozac- Constipation. Causes her to feel cold.  Celexa- Worsening depression at 20 mg dose Lexapro- "My anti-depressant of choice" and gives her energy. Was taking 2-3 years prior to surgery. Re-started one week ago. Has taken 10 mg and 20 mg doses in the past.  Thinks it may have helped with pain in the past. Zoloft- worsening mood Paxil Viibryd- Felt depression was more "heavy." Wellbutrin- Did not cause HA's. Tolerated well and was effective for depression. Auvelity- "felt drugged" and was forgetting appointments.  Amitriptyline- Did not cause HA's. Weight gain. Effective.  Nortriptyline- Did not cause HA's. Effective. Lamictal- Rash Gabapentin -Rhabomyolysis Lyrica- Adverse reaction Buspar- Helpful for anxiety in the past. Well-tolerated. Tried again and had severe anxiety.  Trazodone- helped with sleep initiation but sleep was not restful. Severe dry mouth. Ambien- Parasomnia Lunesta- Tolerated, effective at 2 mg dose Remeron- Dry eyes, internal restlessness. Stomach discomfort.  Clonidine Topamax- ineffective for headaches. Caused cognitive side effects.  Hydroxyzine- Initially helped with anxiety and sleep initiation, then no longer as helpful    PHQ2-9    Flowsheet Row Office Visit from 02/18/2023 in [facility] Medicine [facility] Nutrition from 10/26/2022 in [facility] at [facility] Visit from 09/28/2022 in [facility] Office Visit from 07/27/2022 in [facility] Medicine [facility] Rehabilitation Office Visit from 05/25/2022 in [facility] Physical Medicine & Rehabilitation  PHQ-2 Total Score 0 0 0 2 0      Flowsheet Row Admission (Discharged) from 07/03/2022 in [facility] Pre-Admission Testing 60 from 06/18/2022 in [facility] from 10/12/2021 in [facility] Department at [facility]  C-SSRS RISK CATEGORY No Risk No Risk No Risk  Review of Systems:  Review of Systems  Constitutional:  Positive for fatigue.  Musculoskeletal:  Negative for gait problem.       She reports some issues with spinal stimulator and reports worsening pain. Foot pain.   Psychiatric/Behavioral:         Please refer to HPI    Medications: I have reviewed the patient's current medications.  Current Outpatient Medications  Medication Sig Dispense Refill   amLODipine (NORVASC) 5 MG tablet Take 5 mg by mouth daily.     baclofen (LIORESAL) 10 MG tablet Take 10 mg by mouth 3 (three) times daily. Reports taking 2-3 times daily     nortriptyline (PAMELOR) 10 MG capsule Take 1 capsule (10 mg total) by mouth at bedtime. 30 capsule 1   acetaminophen (TYLENOL) 325 MG tablet Take 650 mg by mouth every 6 (six) hours as needed for moderate pain.     b complex vitamins capsule Take 1 capsule by mouth daily.     BIOTIN PO Take by mouth.     Carboxymethylcellulose Sodium (THERATEARS OP) Place 1 drop into both eyes 4 (four) times daily as needed (dry eyes).     cetirizine (ZYRTEC) 10 MG tablet Take 10 mg by mouth daily as needed for allergies.     Cholecalciferol (VITAMIN D3) 25 MCG (1000 UT) CAPS Take by mouth.     diclofenac Sodium (VOLTAREN) 1 % GEL Apply 2 g topically daily as needed (pain).     EPINEPHrine 0.3 mg/0.3 mL IJ SOAJ injection Inject 0.3 mg into the muscle as needed for anaphylaxis.     escitalopram (LEXAPRO) 5 MG tablet Take 1 tablet daily for 3 days, then decrease to 1/2 tablet daily for 3 days, then stop. 135 tablet 0   eszopiclone (LUNESTA) 2 MG TABS tablet Take 1 tablet (2 mg total) by mouth at bedtime as needed for sleep. Take immediately before bedtime 30 tablet 2   fluticasone (FLONASE) 50 MCG/ACT nasal spray Place 2 sprays into both nostrils 2 (two) times daily as needed for allergies or rhinitis.     lidocaine (LIDODERM) 5 % Place 1 patch onto the skin daily.     magnesium 30 MG tablet Take 30 mg by mouth at bedtime.     methotrexate (RHEUMATREX) 2.5 MG tablet Take by mouth.     rizatriptan (MAXALT) 10 MG tablet Take 10 mg by mouth daily as needed for migraine.      saccharomyces boulardii (FLORASTOR) 250 MG capsule Take 250 mg by mouth daily.      XTAMPZA ER 13.5 MG C12A Take 13.5 mg by mouth every 12 (twelve) hours. (Patient not taking: Reported on 03/16/2023)     No current facility-administered medications for this visit.    Medication Side Effects: None  Allergies:  Allergies  Allergen Reactions   Gabapentin Other (See Comments)    Delirium, stroke like symptoms  Other Reaction(s): Myalgia, Other  Has stroke symptoms when taking this with percocet.     Other reaction(s): Delirium    Delirium, stroke like symptoms   Amitriptyline Other (See Comments)   Amlodipine Other (See Comments)    headaches   Polymyxin B Other (See Comments)    Eyes go blood red    Pregabalin Itching and Rash    Itchy red rash on chest  Other Reaction(s): other   Zolpidem Rash    "sleep walk"    Cymbalta [Duloxetine Hcl] Other (See Comments)    Headaches, constipation   Fluoxetine Other (See  Comments)    CONSTIPATION    Lamotrigine Rash   Other Other (See Comments)    OTOBIOTIC > RED EYES    Past Medical History:  Diagnosis Date   Allergic rhinitis    Anxiety    Arthritis    Cancer (HCC)    skin   Chronic pain    Chronically dry eyes    Depression    Fibromyalgia    GERD (gastroesophageal reflux disease)    Headache    Osteoporosis    Sleep apnea    Stroke (HCC)    from gabapenting per pt no deficits   Vitamin D deficiency    Wears glasses     Past Medical History, Surgical history, Social history, and Family history were reviewed and updated as appropriate.   Please see review of systems for further details on the patient's review from today.   Objective:   Physical Exam:  There were no vitals taken for this visit.  Physical Exam Constitutional:      General: She is not in acute distress. Musculoskeletal:        General: No deformity.  Neurological:     Mental Status: She is alert and oriented to person, place, and time.     Coordination: Coordination normal.  Psychiatric:        Attention and  Perception: Attention and perception normal. She does not perceive auditory or visual hallucinations.        Mood and Affect: Mood is anxious and depressed. Affect is not labile, blunt, angry or inappropriate.        Speech: Speech normal.        Behavior: Behavior normal.        Thought Content: Thought content normal. Thought content is not paranoid or delusional. Thought content does not include homicidal or suicidal ideation. Thought content does not include homicidal or suicidal plan.        Cognition and Memory: Cognition and memory normal.        Judgment: Judgment normal.     Comments: Insight intact     Lab Review:     Component Value Date/Time   NA 140 07/04/2022 0407   NA 142 06/03/2021 1623   K 3.9 07/04/2022 0407   CL 109 07/04/2022 0407   CO2 26 07/04/2022 0407   GLUCOSE 95 07/04/2022 0407   BUN 15 07/04/2022 0407   BUN 21 06/03/2021 1623   CREATININE 0.73 07/04/2022 0407   CALCIUM 8.1 (L) 07/04/2022 0407   PROT 7.4 04/21/2021 0910   ALBUMIN 3.8 04/21/2021 0910   AST 26 04/21/2021 0910   ALT 21 04/21/2021 0910   ALKPHOS 120 04/21/2021 0910   BILITOT 0.7 04/21/2021 0910   GFRNONAA >60 07/04/2022 0407   GFRAA >60 06/26/2019 1008       Component Value Date/Time   WBC 7.0 06/18/2022 1036   RBC 4.71 06/18/2022 1036   HGB 10.8 (L) 07/04/2022 0407   HGB 12.1 06/03/2021 1622   HCT 32.8 (L) 07/04/2022 0407   HCT 37.6 06/03/2021 1622   PLT 294 06/18/2022 1036   PLT 336 06/03/2021 1622   MCV 94.5 06/18/2022 1036   MCV 92 06/03/2021 1622   MCH 29.9 06/18/2022 1036   MCHC 31.7 06/18/2022 1036   RDW 12.8 06/18/2022 1036   RDW 12.8 06/03/2021 1622   LYMPHSABS 2.0 06/03/2021 1622   MONOABS 0.5 04/21/2021 0910   EOSABS 0.1 06/03/2021 1622   BASOSABS 0.0 06/03/2021 1622    No  results found for: "POCLITH", "LITHIUM"   No results found for: "PHENYTOIN", "PHENOBARB", "VALPROATE", "CBMZ"   .res Assessment: Plan:    31 minutes spent dedicated to the care of this  patient on the date of this encounter to include pre-visit review of records, ordering of medication, post visit documentation, and face-to-face time with the patient discussing treatment options and alternatives to Lexapro. Pt reports that she would like to start re-trial of Nortriptyline. Will start Nortriptyline 10 mg at bedtime to improve mood and insomnia.  Continue Lexapro 5 mg for 3 days, then 1/2 tablet daily for 3 days, then stop. Continue Lunesta 2 mg at bedtime as needed for insomnia.  Recommend continuing psychotherapy.  Pt to follow-up with this provider in 4 weeks or sooner if clinically indicated.  Patient advised to contact office with any questions, adverse effects, or acute worsening in signs and symptoms.   Sharon Logan was seen today for depression and anxiety.  Diagnoses and all orders for this visit:  PTSD (post-traumatic stress disorder) -     nortriptyline (PAMELOR) 10 MG capsule; Take 1 capsule (10 mg total) by mouth at bedtime. -     escitalopram (LEXAPRO) 5 MG tablet; Take 1 tablet daily for 3 days, then decrease to 1/2 tablet daily for 3 days, then stop.  Moderate recurrent major depression (HCC) -     nortriptyline (PAMELOR) 10 MG capsule; Take 1 capsule (10 mg total) by mouth at bedtime. -     escitalopram (LEXAPRO) 5 MG tablet; Take 1 tablet daily for 3 days, then decrease to 1/2 tablet daily for 3 days, then stop.  Insomnia, unspecified type     Please see After Visit Summary for patient specific instructions.  Future Appointments  Date Time Provider Department Center  03/29/2023  1:00 PM Raulkar, Drema Pry, MD [facility] CPR  04/19/2023 11:00 AM Corie Chiquito, PMHNP [facility] None  05/03/2023  1:00 PM Raulkar, Drema Pry, MD [facility] CPR  05/31/2023  1:00 PM Raulkar, Drema Pry, MD [facility] CPR  10/04/2023 10:30 AM Drema Dallas, DO [facility] None    No orders of the defined types were placed in this encounter.   -------------------------------

## 2023-03-17 DIAGNOSIS — G8929 Other chronic pain: Secondary | ICD-10-CM | POA: Diagnosis not present

## 2023-03-17 DIAGNOSIS — M5412 Radiculopathy, cervical region: Secondary | ICD-10-CM | POA: Diagnosis not present

## 2023-03-17 DIAGNOSIS — M79605 Pain in left leg: Secondary | ICD-10-CM | POA: Diagnosis not present

## 2023-03-17 DIAGNOSIS — M79604 Pain in right leg: Secondary | ICD-10-CM | POA: Diagnosis not present

## 2023-03-20 ENCOUNTER — Other Ambulatory Visit: Payer: Self-pay | Admitting: Psychiatry

## 2023-03-20 DIAGNOSIS — F339 Major depressive disorder, recurrent, unspecified: Secondary | ICD-10-CM

## 2023-03-20 DIAGNOSIS — F4310 Post-traumatic stress disorder, unspecified: Secondary | ICD-10-CM

## 2023-03-22 ENCOUNTER — Ambulatory Visit: Payer: No Typology Code available for payment source | Admitting: Psychiatry

## 2023-03-27 DIAGNOSIS — M79671 Pain in right foot: Secondary | ICD-10-CM | POA: Diagnosis not present

## 2023-03-27 DIAGNOSIS — M542 Cervicalgia: Secondary | ICD-10-CM | POA: Diagnosis not present

## 2023-03-29 ENCOUNTER — Encounter: Payer: Medicare HMO | Attending: Physical Medicine and Rehabilitation | Admitting: Physical Medicine and Rehabilitation

## 2023-03-29 VITALS — BP 113/74 | HR 74 | Ht 63.0 in | Wt 136.0 lb

## 2023-03-29 DIAGNOSIS — R519 Headache, unspecified: Secondary | ICD-10-CM | POA: Insufficient documentation

## 2023-03-29 DIAGNOSIS — M7918 Myalgia, other site: Secondary | ICD-10-CM | POA: Insufficient documentation

## 2023-03-29 MED ORDER — LIDOCAINE HCL 1 % IJ SOLN
3.0000 mL | Freq: Once | INTRAMUSCULAR | Status: AC
Start: 2023-03-29 — End: 2023-03-29
  Administered 2023-03-29: 3 mL

## 2023-03-29 MED ORDER — SODIUM CHLORIDE (PF) 0.9 % IJ SOLN
2.0000 mL | Freq: Once | INTRAMUSCULAR | Status: AC
Start: 2023-03-29 — End: 2023-03-29
  Administered 2023-03-29: 2 mL via INTRAVENOUS

## 2023-03-29 NOTE — Addendum Note (Signed)
Addended by: Horton Chin on: 03/29/2023 01:13 PM   Modules accepted: Orders

## 2023-03-29 NOTE — Addendum Note (Signed)
Addended by: Silas Sacramento T on: 03/29/2023 01:39 PM   Modules accepted: Orders

## 2023-03-29 NOTE — Progress Notes (Signed)

## 2023-03-31 DIAGNOSIS — G8929 Other chronic pain: Secondary | ICD-10-CM | POA: Diagnosis not present

## 2023-03-31 DIAGNOSIS — Z79899 Other long term (current) drug therapy: Secondary | ICD-10-CM | POA: Diagnosis not present

## 2023-03-31 DIAGNOSIS — M25522 Pain in left elbow: Secondary | ICD-10-CM | POA: Diagnosis not present

## 2023-03-31 DIAGNOSIS — M25561 Pain in right knee: Secondary | ICD-10-CM | POA: Diagnosis not present

## 2023-03-31 DIAGNOSIS — M25562 Pain in left knee: Secondary | ICD-10-CM | POA: Diagnosis not present

## 2023-03-31 DIAGNOSIS — M0609 Rheumatoid arthritis without rheumatoid factor, multiple sites: Secondary | ICD-10-CM | POA: Diagnosis not present

## 2023-03-31 DIAGNOSIS — M150 Primary generalized (osteo)arthritis: Secondary | ICD-10-CM | POA: Diagnosis not present

## 2023-03-31 LAB — ALLERGENS (22) FOODS IGG
Casein IgG: 3.3 ug/mL — ABNORMAL HIGH (ref 0.0–1.9)
Cheese, Mold Type IgG: 7.5 ug/mL — ABNORMAL HIGH (ref 0.0–1.9)
Chicken IgG: <2 ug/mL (ref 0.0–1.9)
Chocolate/Cacao IgG: 3.5 ug/mL — ABNORMAL HIGH (ref 0.0–1.9)
Coffee IgG: 3.6 ug/mL — ABNORMAL HIGH (ref 0.0–1.9)
Corn IgG: 5.5 ug/mL — ABNORMAL HIGH (ref 0.0–1.9)
Egg, Whole IgG: 2.1 ug/mL — ABNORMAL HIGH (ref 0.0–1.9)
Green Bean IgG: 4.5 ug/mL — ABNORMAL HIGH (ref 0.0–1.9)
Haddock IgG: <2 ug/mL (ref 0.0–1.9)
Lamb IgG: 6 ug/mL — ABNORMAL HIGH (ref 0.0–1.9)
Oat IgG: 5.3 ug/mL — ABNORMAL HIGH (ref 0.0–1.9)
Onion IgG: 6.1 ug/mL — ABNORMAL HIGH (ref 0.0–1.9)
Peanut IgG: <2 ug/mL (ref 0.0–1.9)
Pork IgG: 6.3 ug/mL — ABNORMAL HIGH (ref 0.0–1.9)
Potato, White, IgG: 3 ug/mL — ABNORMAL HIGH (ref 0.0–1.9)
Rye IgG: 4.1 ug/mL — ABNORMAL HIGH (ref 0.0–1.9)
Shrimp IgG: 2.9 ug/mL — ABNORMAL HIGH (ref 0.0–1.9)
Soybean IgG: <2 ug/mL (ref 0.0–1.9)
Tomato IgG: 2.6 ug/mL — ABNORMAL HIGH (ref 0.0–1.9)
Wheat IgG: 3.5 ug/mL — ABNORMAL HIGH (ref 0.0–1.9)
Yeast IgG: <2 ug/mL (ref 0.0–1.9)

## 2023-04-05 DIAGNOSIS — M7740 Metatarsalgia, unspecified foot: Secondary | ICD-10-CM | POA: Diagnosis not present

## 2023-04-05 DIAGNOSIS — M722 Plantar fascial fibromatosis: Secondary | ICD-10-CM | POA: Diagnosis not present

## 2023-04-05 DIAGNOSIS — Z4789 Encounter for other orthopedic aftercare: Secondary | ICD-10-CM | POA: Diagnosis not present

## 2023-04-06 ENCOUNTER — Telehealth: Payer: Medicare HMO | Admitting: Physical Medicine and Rehabilitation

## 2023-04-06 DIAGNOSIS — M542 Cervicalgia: Secondary | ICD-10-CM | POA: Diagnosis not present

## 2023-04-06 DIAGNOSIS — H33101 Unspecified retinoschisis, right eye: Secondary | ICD-10-CM | POA: Diagnosis not present

## 2023-04-06 DIAGNOSIS — S92901K Unspecified fracture of right foot, subsequent encounter for fracture with nonunion: Secondary | ICD-10-CM | POA: Diagnosis not present

## 2023-04-06 DIAGNOSIS — H2513 Age-related nuclear cataract, bilateral: Secondary | ICD-10-CM | POA: Diagnosis not present

## 2023-04-06 DIAGNOSIS — Z79899 Other long term (current) drug therapy: Secondary | ICD-10-CM | POA: Diagnosis not present

## 2023-04-06 DIAGNOSIS — H538 Other visual disturbances: Secondary | ICD-10-CM | POA: Diagnosis not present

## 2023-04-06 DIAGNOSIS — Z9889 Other specified postprocedural states: Secondary | ICD-10-CM | POA: Diagnosis not present

## 2023-04-06 DIAGNOSIS — R52 Pain, unspecified: Secondary | ICD-10-CM | POA: Diagnosis not present

## 2023-04-09 ENCOUNTER — Encounter (HOSPITAL_BASED_OUTPATIENT_CLINIC_OR_DEPARTMENT_OTHER): Payer: Medicare HMO | Admitting: Physical Medicine and Rehabilitation

## 2023-04-09 ENCOUNTER — Other Ambulatory Visit: Payer: Self-pay | Admitting: Psychiatry

## 2023-04-09 DIAGNOSIS — R519 Headache, unspecified: Secondary | ICD-10-CM | POA: Diagnosis not present

## 2023-04-09 DIAGNOSIS — F4310 Post-traumatic stress disorder, unspecified: Secondary | ICD-10-CM

## 2023-04-09 DIAGNOSIS — F331 Major depressive disorder, recurrent, moderate: Secondary | ICD-10-CM

## 2023-04-09 NOTE — Progress Notes (Signed)
Subjective:    Patient ID: Sharon Logan, female    DOB: 1955-10-28, 67 y.o.   MRN: [id number]  HPI  An audio/video tele-health visit is felt to be the most appropriate encounter for this patient at this time. This is a follow up tele-visit via phone. The patient is at home. MD is at office. Prior to scheduling this appointment, our staff discussed the limitations of evaluation and management by telemedicine and the availability of in-person appointments. The patient expressed understanding and agreed to proceed.   Sharon Logan is a 67 year old woman who presents for follow-up of cervical myofascial pain syndrome, intolerance to cold, headaches   1) Cervical myofasical pain syndrome: -Muscles feel like dead weight- both arms and legs.   -going on for 4 years.  -found her husband's body- her husband had throat cancer and was doing radiation and chemo at the same time and suffered a heart attack. This was 14 years ago and may have been a trigger.  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   -she has been given mirtazepine as she also has anxiety   -she has been given hydrocodone and she feels she has built up a tolerance  -she has never tried Topamax.   -she gets cold really fast and hurts twice as bad. She has even been getting cold in the summer and this is intolerable to her  -she loves to read  -she has tried a TENS unit before with benefit while she used it  -continues to have a lot of cervical myofascial pain and is ready to try trigger point injections today. She cannot recall if she has tried these before.   2) Migraine: -she asks about results of food sensitivity testing  Pain Inventory Average Pain 5 Pain Right Now 4 My pain is sharp, burning, stabbing, tingling and aching  In the last 24 hours, has pain interfered with the following? General activity 5 Relation with others 5 Enjoyment of life 5 What TIME of day is your pain at its worst?  evening Sleep (in general) Poor  Pain is worse with: walking, bending and some activites Pain improves with: rest, heat/ice, therapy/exercise and injections Relief from Meds: 4  walk without assistance how many minutes can you walk? 40 ability to climb steps?  yes do you drive?  yes  disabled: date disabled Jan 2013 retired  numbness depression anxiety  New pt  New pt    Family History  Problem Relation Age of Onset   Depression Mother    Heart attack Mother    COPD Mother    Depression Father    Heart attack Father    ADD / ADHD Son    Social History   Socioeconomic History   Marital status: Widowed    Spouse name: Not on file   Number of children: 2   Years of education: Not on file   Highest education level: Not on file  Occupational History   Not on file  Tobacco Use   Smoking status: Never   Smokeless tobacco: Never  Vaping Use   Vaping status: Never Used  Substance and Sexual Activity   Alcohol use: No   Drug use: No   Sexual activity: Not Currently    Birth control/protection: Surgical    Comment: Hysterectomy  Other Topics Concern   Not on file  Social History Narrative   Right handed   Lives in a one story  home   Drinks Tea   Social Determinants of Health   Financial Resource Strain: Low Risk  (04/02/2023)   Received from [facility]   Overall Financial Resource Strain (CARDIA)    Difficulty of Paying Living Expenses: Not hard at all  Food Insecurity: No Food Insecurity (04/02/2023)   Received from [facility]   Hunger Vital Sign    Worried About Running Out of Food in the Last Year: Never true    Ran Out of Food in the Last Year: Never true  Transportation Needs: No Transportation Needs (04/02/2023)   Received from [facility] - Transportation    Lack of Transportation (Medical): No    Lack of Transportation (Non-Medical): No  Physical Activity: Sufficiently Active (04/02/2023)   Received from [facility]    Exercise Vital Sign    Days of Exercise per Week: 4 days    Minutes of Exercise per Session: 40 min  Stress: No Stress Concern Present (04/02/2023)   Received from [facility] - Occupational Stress Questionnaire    Feeling of Stress : Only a little  Social Connections: Moderately Integrated (04/02/2023)   Received from [facility]   Social Network    How would you rate your social network (family, work, friends)?: Adequate participation with social networks   Past Surgical History:  Procedure Laterality Date   ABDOMINAL HYSTERECTOMY     BACK SURGERY     BUNIONECTOMY Bilateral    EYE SURGERY     laser surgery   NASAL SINUS SURGERY     x2   REVERSE SHOULDER ARTHROPLASTY Right 07/03/2022   Procedure: REVERSE SHOULDER ARTHROPLASTY;  Surgeon: Beverely Low, MD;  Location: [facility] ORS;  Service: Orthopedics;  Laterality: Right;  120 min choice with interscalene block   SACROILIAC JOINT FUSION Bilateral 09/12/2020   Procedure: REMOVAL OF BILATERAL PELVIC SCREWS;  Surgeon: Estill Bamberg, MD;  Location: [facility] OR;  Service: Orthopedics;  Laterality: Bilateral;   SKIN CANCER EXCISION     face x3   TUBAL LIGATION     Past Medical History:  Diagnosis Date   Allergic rhinitis    Anxiety    Arthritis    Cancer (HCC)    skin   Chronic pain    Chronically dry eyes    Depression    Fibromyalgia    GERD (gastroesophageal reflux disease)    Headache    Osteoporosis    Sleep apnea    Stroke (HCC)    from gabapenting per pt no deficits   Vitamin D deficiency    Wears glasses    There were no vitals taken for this visit.  Opioid Risk Score:   Fall Risk Score:  `1  Depression screen [facility] 2/9     02/18/2023   10:58 AM 10/26/2022   10:59 AM 09/28/2022   10:09 AM 07/27/2022   10:17 AM 05/25/2022   10:54 AM 10/06/2021   10:49 AM 03/25/2021   11:14 AM  Depression screen PHQ 2/9  Decreased Interest 0 0 0 1 0 0 0  Down, Depressed, Hopeless 0 0 0 1 0  0 0  PHQ - 2 Score 0 0 0 2 0 0 0    Review of Systems  Musculoskeletal:  Positive for myalgias.       Arm, leg, foot pain  Neurological:  Positive for numbness.  Psychiatric/Behavioral:  Positive for dysphoric mood. The patient is nervous/anxious.   All other systems reviewed  and are negative.      Objective:   Physical Exam PRIOR EXAM Gen: no distress, normal appearing HEENT: oral mucosa pink and moist, NCAT Cardio: Reg rate Chest: normal effort, normal rate of breathing Abd: soft, non-distended Ext: no edema Psych: pleasant, normal affect Skin: intact Neuro: Alert and oriented x3 Musculoskeletal: Diffuse pain and tenderness in upper and lower extremities. +trigger point injections in bilateral trapezius muscles     Assessment & Plan:  Sharon Logan is a 67 year old woman who presents to establish care for fibromyalgia:  Chronic Pain Syndrome secondary to fibromyalgia -Discussed current symptoms of pain and history of pain.  -Discussed benefits of exercise in reducing pain. -start topamax 25mg  HS, can uptitrate in 3 days if well tolerated.  -Discussed following foods that may reduce pain: 1) Ginger (especially studied for arthritis)- reduce leukotriene production to decrease inflammation 2) Blueberries- high in phytonutrients that decrease inflammation 3) Salmon- marine omega-3s reduce joint swelling and pain 4) Pumpkin seeds- reduce inflammation 5) dark chocolate- reduces inflammation 6) turmeric- reduces inflammation 7) tart cherries - reduce pain and stiffness 8) extra virgin olive oil - its compound olecanthal helps to block prostaglandins  9) chili peppers- can be eaten or applied topically via capsaicin 10) mint- helpful for headache, muscle aches, joint pain, and itching 11) garlic- reduces inflammation  Link to further information on diet for chronic pain: http://www.bray.com/  2.  Insomnia: -Try to go outside near sunrise -Get exercise during the day.  -Discussed good sleep hygiene: turning off all devices an hour before bedtime.  -Chamomile tea with dinner.  -Can consider over the counter melatonin  2) Anxiety: -Discussed exercise and meditation as tools to decrease anxiety. -Recommended Down Dog Yoga app -Discussed spending time outdoors. -Discussed positive re-framing of anxiety.  -Discussed the following foods that have been show to reduce anxiety: 1) [place] nuts, mushrooms, soy beans due to their high selenium content. Upper limit of toxicity of selenium is 444mcg/day so no more than 3-4 [place] nuts per day.  2) Fatty fish such as salmon, mackerel, sardines, trout, and herring- high in omega-3 fatty acids 3) Eggs- increases serotonin and dopamine 4) Pumpkin seeds- high in omega-3 fatty acids 5) dark chocolate- high in flavanols that increase blood flow to brain 6) turmeric- take with black pepper to increase absorption 7) chamomile tea- antioxidant and anti-inflammatory properties 8) yogurt without sugar- supports gut-brain axis 9) green tea- contains L- theanine 10) blueberries- high in vitamin C and antioxidants 11) [place]- high in tryptophan which gets converted to serotonin 12) bell peppers- rich in vitamin C and antioxidants 13) citrus fruits- rich in vitamin C and antioxidants 14) almonds- high in vitamin E and healthy fats 15) chia seeds- high in omega-3 fatty acids  3) Cervical myofascial pain syndrome Trigger Point Injection  Indication: Cervical myofascial pain not relieved by medication management and other conservative care.  Informed consent was obtained after describing risk and benefits of the procedure with the patient, this includes bleeding, bruising, infection and medication side effects.  The patient wishes to proceed and has given written consent.  The patient was placed in a seated position.  The cervical area was marked and prepped  with Betadine.  It was entered with a 25-gauge 1/2 inch needle and a total of 5 mL of 1% lidocaine was injected into a total of 4 trigger points, after negative draw back for blood.  The patient tolerated the procedure well.  Post procedure instructions were given.    -  advised heat to relax muscles -can repeat in 3 months if needed -call me to repeat earlier if needed -discussed benefits of TENs unit, can try for her if trigger point injections not providing enough relief.  4) Cold intolerance -very bothersome to her -present even in summer -thyroid low end of norma -prescribed levothyroxine to see if this helps, advised her to take 30-60 minutes before breakfast and to please let me know  5) Low vitamin D -continue current supplementation -advised regarding the benefits of high dose supplementation, can consider if symptoms fail to improve with above regimen.   6) Migraines: -discussed her food sensitivity results, discussed that cheese mold was the highest response and that she already has tried to limit cheese as she knows this worsens her symptoms, discussed tying to minimize processed food and gluten as well  10 minutes spent in discussion of her food sensitivity results, discussed that cheese mold was the highest response and that she already has tried to limit cheese as she knows this worsens her symptoms, discussed tying to minimize processed food and gluten as well

## 2023-04-12 DIAGNOSIS — M25529 Pain in unspecified elbow: Secondary | ICD-10-CM | POA: Diagnosis not present

## 2023-04-12 DIAGNOSIS — M25561 Pain in right knee: Secondary | ICD-10-CM | POA: Diagnosis not present

## 2023-04-12 DIAGNOSIS — Z79899 Other long term (current) drug therapy: Secondary | ICD-10-CM | POA: Diagnosis not present

## 2023-04-12 DIAGNOSIS — M79642 Pain in left hand: Secondary | ICD-10-CM | POA: Diagnosis not present

## 2023-04-12 DIAGNOSIS — M79643 Pain in unspecified hand: Secondary | ICD-10-CM | POA: Diagnosis not present

## 2023-04-12 DIAGNOSIS — M25569 Pain in unspecified knee: Secondary | ICD-10-CM | POA: Diagnosis not present

## 2023-04-12 DIAGNOSIS — M79673 Pain in unspecified foot: Secondary | ICD-10-CM | POA: Diagnosis not present

## 2023-04-12 DIAGNOSIS — M2550 Pain in unspecified joint: Secondary | ICD-10-CM | POA: Diagnosis not present

## 2023-04-12 DIAGNOSIS — M79671 Pain in right foot: Secondary | ICD-10-CM | POA: Diagnosis not present

## 2023-04-12 DIAGNOSIS — M79672 Pain in left foot: Secondary | ICD-10-CM | POA: Diagnosis not present

## 2023-04-12 DIAGNOSIS — M1990 Unspecified osteoarthritis, unspecified site: Secondary | ICD-10-CM | POA: Diagnosis not present

## 2023-04-12 DIAGNOSIS — M79641 Pain in right hand: Secondary | ICD-10-CM | POA: Diagnosis not present

## 2023-04-12 DIAGNOSIS — M549 Dorsalgia, unspecified: Secondary | ICD-10-CM | POA: Diagnosis not present

## 2023-04-12 DIAGNOSIS — M25562 Pain in left knee: Secondary | ICD-10-CM | POA: Diagnosis not present

## 2023-04-12 DIAGNOSIS — E790 Hyperuricemia without signs of inflammatory arthritis and tophaceous disease: Secondary | ICD-10-CM | POA: Diagnosis not present

## 2023-04-12 DIAGNOSIS — M0609 Rheumatoid arthritis without rheumatoid factor, multiple sites: Secondary | ICD-10-CM | POA: Diagnosis not present

## 2023-04-15 DIAGNOSIS — M86671 Other chronic osteomyelitis, right ankle and foot: Secondary | ICD-10-CM | POA: Diagnosis not present

## 2023-04-15 DIAGNOSIS — Z20822 Contact with and (suspected) exposure to covid-19: Secondary | ICD-10-CM | POA: Diagnosis not present

## 2023-04-15 DIAGNOSIS — M19071 Primary osteoarthritis, right ankle and foot: Secondary | ICD-10-CM | POA: Diagnosis not present

## 2023-04-15 DIAGNOSIS — M89771 Major osseous defect, right ankle and foot: Secondary | ICD-10-CM | POA: Diagnosis not present

## 2023-04-15 DIAGNOSIS — Z881 Allergy status to other antibiotic agents status: Secondary | ICD-10-CM | POA: Diagnosis not present

## 2023-04-15 DIAGNOSIS — R0602 Shortness of breath: Secondary | ICD-10-CM | POA: Diagnosis not present

## 2023-04-19 ENCOUNTER — Encounter: Payer: Self-pay | Admitting: Psychiatry

## 2023-04-19 ENCOUNTER — Ambulatory Visit (INDEPENDENT_AMBULATORY_CARE_PROVIDER_SITE_OTHER): Payer: No Typology Code available for payment source | Admitting: Psychiatry

## 2023-04-19 DIAGNOSIS — L814 Other melanin hyperpigmentation: Secondary | ICD-10-CM | POA: Diagnosis not present

## 2023-04-19 DIAGNOSIS — F331 Major depressive disorder, recurrent, moderate: Secondary | ICD-10-CM

## 2023-04-19 DIAGNOSIS — G4700 Insomnia, unspecified: Secondary | ICD-10-CM

## 2023-04-19 DIAGNOSIS — L821 Other seborrheic keratosis: Secondary | ICD-10-CM | POA: Diagnosis not present

## 2023-04-19 DIAGNOSIS — D229 Melanocytic nevi, unspecified: Secondary | ICD-10-CM | POA: Diagnosis not present

## 2023-04-19 DIAGNOSIS — F4310 Post-traumatic stress disorder, unspecified: Secondary | ICD-10-CM | POA: Diagnosis not present

## 2023-04-19 DIAGNOSIS — Z85828 Personal history of other malignant neoplasm of skin: Secondary | ICD-10-CM | POA: Diagnosis not present

## 2023-04-19 DIAGNOSIS — L570 Actinic keratosis: Secondary | ICD-10-CM | POA: Diagnosis not present

## 2023-04-19 MED ORDER — NORTRIPTYLINE HCL 10 MG PO CAPS
20.0000 mg | ORAL_CAPSULE | Freq: Every day | ORAL | Status: DC
Start: 2023-04-19 — End: 2023-05-04

## 2023-04-19 MED ORDER — ESZOPICLONE 2 MG PO TABS: 2.0000 mg | ORAL_TABLET | Freq: Every evening | ORAL | 5 refills | Status: DC | PRN

## 2023-04-19 NOTE — Progress Notes (Signed)
Sharon Logan [id number] Apr 27, 1956 67 y.o.  Subjective:   Patient ID:  Sharon Logan is a 67 y.o. (DOB 1955/12/11) female.  Chief Complaint:  Chief Complaint  Patient presents with   Anxiety   Depression   Insomnia    HPI Sharon Logan presents to the office today for follow-up of depression, anxiety, and insomnia. She reports feeling "not too bad... I like the Nortriptyline." She reports that she notices some improvement in anxiety with some continued anxiety. She notices difficulty with patience and continues to be easily irritated. Notices she is easily irritated with her dog at times. She reports frustration in response to chronic pain and feeling that spinal cord stimulator makes her pain worse. She reports that her depression is "stable right now." She reports that her energy has been very low. Motivation is also low. She reports that she would probably not be able to sleep without Lunesta. She reports several hours after taking Lunesta. She reports that she feels less anxious when able to sleep 4-5 hours a night. She reports that her appetite has been low. She reports poor concentration. Denies SI.   Continues to see therapist regularly.   She reports benefits are changing and she is trying to decide what she needs to do.   Lunesta last filled 03/25/23.    Past Medication Trials: Cymbalta- Started after back surgery. Has made her tired, even when reduced to 30 mg. Was having to take 2 hour naps. May have helped slightly with pain. Had HA, Constipation, Nausea Savella- muscle aches, rhabdomyolysis Effexor-HA, helped with excessive sweating.  Pristiq- May have increased depression. Prozac- Constipation. Causes her to feel cold.  Celexa- Worsening depression at 20 mg dose Lexapro- "My anti-depressant of choice" and gives her energy. Was taking 2-3 years prior to surgery. Re-started one week ago. Has taken 10 mg and 20 mg doses in the past. Thinks it may have helped with pain in  the past. Zoloft- worsening mood Paxil Viibryd- Felt depression was more "heavy." Wellbutrin- Did not cause HA's. Tolerated well and was effective for depression. Auvelity- "felt drugged" and was forgetting appointments.  Amitriptyline- Did not cause HA's. Weight gain. Effective.  Nortriptyline- Did not cause HA's. Effective. Lamictal- Rash Gabapentin -Rhabomyolysis Lyrica- Adverse reaction Buspar- Helpful for anxiety in the past. Well-tolerated. Tried again and had severe anxiety.  Trazodone- helped with sleep initiation but sleep was not restful. Severe dry mouth. Ambien- Parasomnia Lunesta- Tolerated, effective at 2 mg dose Remeron- Dry eyes, internal restlessness. Stomach discomfort.  Clonidine Topamax- ineffective for headaches. Caused cognitive side effects.  Hydroxyzine- Initially helped with anxiety and sleep initiation, then no longer as helpful  PHQ2-9    Flowsheet Row Office Visit from 02/18/2023 in [facility] - A Dept Of [facility] Nutrition from 10/26/2022 in [facility]  - A Dept Of [facility] Main Office Visit from 09/28/2022 in [facility] - A Dept Of [facility] Office Visit from 07/27/2022 in [facility] - A Dept Of [facility] Office Visit from 05/25/2022 in [facility] - A Dept Of [facility]  PHQ-2 Total Score 0 0 0 2 0      Flowsheet Row Admission (Discharged) from 07/03/2022 in [facility] Pre-Admission Testing 60 from 06/18/2022 in [facility] from  10/12/2021 in [facility] Emergency Department at [facility]  C-SSRS RISK CATEGORY No Risk No Risk No Risk        Review of Systems:  Review of Systems  Constitutional:  Positive for fatigue.  Musculoskeletal:  Positive for back pain. Negative for gait problem.       She  reports having had some difficulties with spinal cord stimulator.   Neurological:        She reports "nerve pain throughout my body."  Psychiatric/Behavioral:         Please refer to HPI  She reports possible foot infection.   Medications: I have reviewed the patient's current medications.  Current Outpatient Medications  Medication Sig Dispense Refill   Buprenorphine HCl 600 MCG FILM Place inside cheek.     acetaminophen (TYLENOL) 325 MG tablet Take 650 mg by mouth every 6 (six) hours as needed for moderate pain.     amLODipine (NORVASC) 5 MG tablet Take 5 mg by mouth daily.     b complex vitamins capsule Take 1 capsule by mouth daily.     baclofen (LIORESAL) 10 MG tablet Take 10 mg by mouth 3 (three) times daily. Reports taking 2-3 times daily     BIOTIN PO Take by mouth.     Carboxymethylcellulose Sodium (THERATEARS OP) Place 1 drop into both eyes 4 (four) times daily as needed (dry eyes).     cetirizine (ZYRTEC) 10 MG tablet Take 10 mg by mouth daily as needed for allergies.     Cholecalciferol (VITAMIN D3) 25 MCG (1000 UT) CAPS Take by mouth.     diclofenac Sodium (VOLTAREN) 1 % GEL Apply 2 g topically daily as needed (pain).     EPINEPHrine 0.3 mg/0.3 mL IJ SOAJ injection Inject 0.3 mg into the muscle as needed for anaphylaxis.     [START ON 04/22/2023] eszopiclone (LUNESTA) 2 MG TABS tablet Take 1 tablet (2 mg total) by mouth at bedtime as needed for sleep. Take immediately before bedtime 30 tablet 5   fluticasone (FLONASE) 50 MCG/ACT nasal spray Place 2 sprays into both nostrils 2 (two) times daily as needed for allergies or rhinitis.     lidocaine (LIDODERM) 5 % Place 1 patch onto the skin daily.     magnesium 30 MG tablet Take 30 mg by mouth at bedtime.     nortriptyline (PAMELOR) 10 MG capsule Take 2 capsules (20 mg total) by mouth at bedtime.     rizatriptan (MAXALT) 10 MG tablet Take 10 mg by mouth daily as needed for migraine.      saccharomyces boulardii (FLORASTOR) 250 MG  capsule Take 250 mg by mouth daily.     No current facility-administered medications for this visit.    Medication Side Effects: None  Allergies:  Allergies  Allergen Reactions   Gabapentin Other (See Comments)    Delirium, stroke like symptoms  Other Reaction(s): Myalgia, Other  Has stroke symptoms when taking this with percocet.     Other reaction(s): Delirium    Delirium, stroke like symptoms   Amitriptyline Other (See Comments)   Amlodipine Other (See Comments)    headaches   Polymyxin B Other (See Comments)    Eyes go blood red    Pregabalin Itching and Rash    Itchy red rash on chest  Other Reaction(s): other   Zolpidem Rash    "sleep walk"    Cymbalta [Duloxetine Hcl] Other (See Comments)    Headaches, constipation   Fluoxetine Other (See  Comments)    CONSTIPATION    Lamotrigine Rash   Other Other (See Comments)    OTOBIOTIC > RED EYES    Past Medical History:  Diagnosis Date   Allergic rhinitis    Anxiety    Arthritis    Cancer (HCC)    skin   Chronic pain    Chronically dry eyes    Depression    Fibromyalgia    GERD (gastroesophageal reflux disease)    Headache    Osteoporosis    Sleep apnea    Stroke (HCC)    from gabapenting per pt no deficits   Vitamin D deficiency    Wears glasses     Past Medical History, Surgical history, Social history, and Family history were reviewed and updated as appropriate.   Please see review of systems for further details on the patient's review from today.   Objective:   Physical Exam:  There were no vitals taken for this visit.  Physical Exam Constitutional:      General: She is not in acute distress. Musculoskeletal:        General: No deformity.  Neurological:     Mental Status: She is alert and oriented to person, place, and time.     Coordination: Coordination normal.  Psychiatric:        Attention and Perception: Attention and perception normal. She does not perceive auditory or visual  hallucinations.        Mood and Affect: Mood is anxious. Affect is not labile, blunt, angry or inappropriate.        Speech: Speech normal.        Behavior: Behavior normal.        Thought Content: Thought content normal. Thought content is not paranoid or delusional. Thought content does not include homicidal or suicidal ideation. Thought content does not include homicidal or suicidal plan.        Cognition and Memory: Cognition and memory normal.        Judgment: Judgment normal.     Comments: Insight intact Mood is mildly dysphoric     Lab Review:     Component Value Date/Time   NA 140 07/04/2022 0407   NA 142 06/03/2021 1623   K 3.9 07/04/2022 0407   CL 109 07/04/2022 0407   CO2 26 07/04/2022 0407   GLUCOSE 95 07/04/2022 0407   BUN 15 07/04/2022 0407   BUN 21 06/03/2021 1623   CREATININE 0.73 07/04/2022 0407   CALCIUM 8.1 (L) 07/04/2022 0407   PROT 7.4 04/21/2021 0910   ALBUMIN 3.8 04/21/2021 0910   AST 26 04/21/2021 0910   ALT 21 04/21/2021 0910   ALKPHOS 120 04/21/2021 0910   BILITOT 0.7 04/21/2021 0910   GFRNONAA >60 07/04/2022 0407   GFRAA >60 06/26/2019 1008       Component Value Date/Time   WBC 7.0 06/18/2022 1036   RBC 4.71 06/18/2022 1036   HGB 10.8 (L) 07/04/2022 0407   HGB 12.1 06/03/2021 1622   HCT 32.8 (L) 07/04/2022 0407   HCT 37.6 06/03/2021 1622   PLT 294 06/18/2022 1036   PLT 336 06/03/2021 1622   MCV 94.5 06/18/2022 1036   MCV 92 06/03/2021 1622   MCH 29.9 06/18/2022 1036   MCHC 31.7 06/18/2022 1036   RDW 12.8 06/18/2022 1036   RDW 12.8 06/03/2021 1622   LYMPHSABS 2.0 06/03/2021 1622   MONOABS 0.5 04/21/2021 0910   EOSABS 0.1 06/03/2021 1622   BASOSABS 0.0 06/03/2021 1622  No results found for: "POCLITH", "LITHIUM"   No results found for: "PHENYTOIN", "PHENOBARB", "VALPROATE", "CBMZ"   .res Assessment: Plan:    33 minutes spent dedicated to the care of this patient on the date of this encounter to include pre-visit review of  records, ordering of medication, post visit documentation, and face-to-face time with the patient discussing response to re-start of Nortriptyline and ongoing treatment plan. She reports that she is noticing some improvement in depression since re-starting Nortriptyline and would like to increase dose further. She reports that she recently had 10 mg capsules filled and would like to increase Nortriptyline to 20 mg at bedtime. She reports that she will call to request a refill based on response to increase. Discussed that Nortriptyline is available in a 25 mg capsule and script can be changed to 25 mg capsule or two 10 mg capsules at bedtime.  Continue Lunesta 2 mg po at bedtime for insomnia. Pt to follow-up with this provider in 6 weeks or sooner if clinically indicated.  Patient advised to contact office with any questions, adverse effects, or acute worsening in signs and symptoms.   Sharon Logan was seen today for anxiety, depression and insomnia.  Diagnoses and all orders for this visit:  PTSD (post-traumatic stress disorder) -     nortriptyline (PAMELOR) 10 MG capsule; Take 2 capsules (20 mg total) by mouth at bedtime.  Moderate recurrent major depression (HCC) -     nortriptyline (PAMELOR) 10 MG capsule; Take 2 capsules (20 mg total) by mouth at bedtime.  Insomnia, unspecified type -     eszopiclone (LUNESTA) 2 MG TABS tablet; Take 1 tablet (2 mg total) by mouth at bedtime as needed for sleep. Take immediately before bedtime     Please see After Visit Summary for patient specific instructions.  Future Appointments  Date Time Provider Department Center  05/03/2023  1:00 PM Raulkar, Drema Pry, MD [facility] CPR  05/31/2023 11:30 AM Corie Chiquito, PMHNP [facility] None  05/31/2023  1:00 PM Raulkar, Drema Pry, MD [facility] CPR  10/04/2023 10:30 AM Drema Dallas, DO [facility] None    No orders of the defined types were placed in this encounter.   -------------------------------

## 2023-04-21 DIAGNOSIS — S92901K Unspecified fracture of right foot, subsequent encounter for fracture with nonunion: Secondary | ICD-10-CM | POA: Diagnosis not present

## 2023-04-21 DIAGNOSIS — Z9889 Other specified postprocedural states: Secondary | ICD-10-CM | POA: Diagnosis not present

## 2023-04-22 DIAGNOSIS — Z9889 Other specified postprocedural states: Secondary | ICD-10-CM | POA: Diagnosis not present

## 2023-04-26 DIAGNOSIS — Z6824 Body mass index (BMI) 24.0-24.9, adult: Secondary | ICD-10-CM | POA: Diagnosis not present

## 2023-04-26 DIAGNOSIS — Z5181 Encounter for therapeutic drug level monitoring: Secondary | ICD-10-CM | POA: Diagnosis not present

## 2023-04-26 DIAGNOSIS — M810 Age-related osteoporosis without current pathological fracture: Secondary | ICD-10-CM | POA: Diagnosis not present

## 2023-04-26 DIAGNOSIS — G4733 Obstructive sleep apnea (adult) (pediatric): Secondary | ICD-10-CM | POA: Diagnosis not present

## 2023-04-26 DIAGNOSIS — Z0000 Encounter for general adult medical examination without abnormal findings: Secondary | ICD-10-CM | POA: Diagnosis not present

## 2023-04-26 DIAGNOSIS — I10 Essential (primary) hypertension: Secondary | ICD-10-CM | POA: Diagnosis not present

## 2023-04-26 DIAGNOSIS — M8580 Other specified disorders of bone density and structure, unspecified site: Secondary | ICD-10-CM | POA: Diagnosis not present

## 2023-04-26 DIAGNOSIS — G8929 Other chronic pain: Secondary | ICD-10-CM | POA: Diagnosis not present

## 2023-04-26 DIAGNOSIS — Z136 Encounter for screening for cardiovascular disorders: Secondary | ICD-10-CM | POA: Diagnosis not present

## 2023-04-26 DIAGNOSIS — I700 Atherosclerosis of aorta: Secondary | ICD-10-CM | POA: Diagnosis not present

## 2023-04-27 ENCOUNTER — Other Ambulatory Visit (HOSPITAL_COMMUNITY): Payer: Self-pay | Admitting: Family Medicine

## 2023-04-27 DIAGNOSIS — M542 Cervicalgia: Secondary | ICD-10-CM | POA: Diagnosis not present

## 2023-04-27 DIAGNOSIS — I700 Atherosclerosis of aorta: Secondary | ICD-10-CM

## 2023-04-28 ENCOUNTER — Encounter: Payer: Self-pay | Admitting: Psychiatry

## 2023-05-03 ENCOUNTER — Encounter: Payer: Medicare HMO | Attending: Physical Medicine and Rehabilitation | Admitting: Physical Medicine and Rehabilitation

## 2023-05-03 ENCOUNTER — Ambulatory Visit (HOSPITAL_COMMUNITY)
Admission: RE | Admit: 2023-05-03 | Discharge: 2023-05-03 | Disposition: A | Discharge status: Home / Self Care | Payer: Self-pay | Source: Ambulatory Visit | Attending: Family Medicine | Admitting: Family Medicine

## 2023-05-03 VITALS — BP 123/79 | HR 87 | Ht 63.0 in | Wt 136.0 lb

## 2023-05-03 DIAGNOSIS — Z5181 Encounter for therapeutic drug level monitoring: Secondary | ICD-10-CM

## 2023-05-03 DIAGNOSIS — Z79891 Long term (current) use of opiate analgesic: Secondary | ICD-10-CM | POA: Diagnosis not present

## 2023-05-03 DIAGNOSIS — M79605 Pain in left leg: Secondary | ICD-10-CM | POA: Diagnosis not present

## 2023-05-03 DIAGNOSIS — G894 Chronic pain syndrome: Secondary | ICD-10-CM

## 2023-05-03 DIAGNOSIS — M79604 Pain in right leg: Secondary | ICD-10-CM

## 2023-05-03 DIAGNOSIS — I700 Atherosclerosis of aorta: Secondary | ICD-10-CM | POA: Insufficient documentation

## 2023-05-03 DIAGNOSIS — G629 Polyneuropathy, unspecified: Secondary | ICD-10-CM | POA: Diagnosis not present

## 2023-05-03 DIAGNOSIS — M7918 Myalgia, other site: Secondary | ICD-10-CM

## 2023-05-03 MED ORDER — LIDOCAINE HCL 1 % IJ SOLN
3.0000 mL | Freq: Once | INTRAMUSCULAR | Status: AC
  Administered 2023-05-03: 3 mL

## 2023-05-03 MED ORDER — SODIUM CHLORIDE (PF) 0.9 % IJ SOLN
2.0000 mL | Freq: Once | INTRAMUSCULAR | Status: AC
  Administered 2023-05-03: 2 mL via INTRAVENOUS

## 2023-05-03 NOTE — Addendum Note (Signed)
Addended by: Horton Chin on: 05/03/2023 01:42 PM   Modules accepted: Orders, Level of Service

## 2023-05-03 NOTE — Progress Notes (Signed)
Subjective:    Patient ID: Sharon Logan, female    DOB: May 02, 1956, 67 y.o.   MRN: [id number]  HPI  Mrs. Coldren is a 67 year old woman who presents for follow-up of cervical myofascial pain syndrome, intolerance to cold.   -Muscles feel like dead weight- both arms and legs.   -going on for 4 years.  -found her husband's body- her husband had throat cancer and was doing radiation and chemo at the same time and suffered a heart attack. This was 14 years ago and may have been a trigger.  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   -she has been given mirtazepine as she also has anxiety   -she has been given hydrocodone and she feels she has built up a tolerance  -she has never tried Topamax.   -she gets cold really fast and hurts twice as bad. She has even been getting cold in the summer and this is intolerable to her  -she loves to read  -she has tried a TENS unit before with benefit while she used it  -continues to have a lot of cervical myofascial pain and is ready to try trigger point injections today. She cannot recall if she has tried these before.   2) Neuropathy: -present in calves -started since she has a SCS placed -she has told her SCS company's rep that that she wants the device removed but they have said they need an XR result  Pain Inventory Average Pain 5 Pain Right Now 4 My pain is sharp, burning, stabbing, tingling and aching  In the last 24 hours, has pain interfered with the following? General activity 5 Relation with others 5 Enjoyment of life 5 What TIME of day is your pain at its worst? evening Sleep (in general) Poor  Pain is worse with: walking, bending and some activites Pain improves with: rest, heat/ice, therapy/exercise and injections Relief from Meds: 4  walk without assistance how many minutes can you walk? 40 ability to climb steps?  yes do you drive?  yes  disabled: date disabled Jan  2013 retired  numbness depression anxiety  New pt  New pt    Family History  Problem Relation Age of Onset   Depression Mother    Heart attack Mother    COPD Mother    Depression Father    Heart attack Father    ADD / ADHD Son    Social History   Socioeconomic History   Marital status: Widowed    Spouse name: Not on file   Number of children: 2   Years of education: Not on file   Highest education level: Not on file  Occupational History   Not on file  Tobacco Use   Smoking status: Never   Smokeless tobacco: Never  Vaping Use   Vaping status: Never Used  Substance and Sexual Activity   Alcohol use: No   Drug use: No   Sexual activity: Not Currently    Birth control/protection: Surgical    Comment: Hysterectomy  Other Topics Concern   Not on file  Social History Narrative   Right handed   Lives in a one story home   Drinks Tea   Social Determinants of Health   Financial Resource Strain: Low Risk  (04/02/2023)   Received from [facility]   Overall Financial Resource Strain (CARDIA)    Difficulty of Paying Living Expenses: Not hard at all  Food Insecurity:  No Food Insecurity (04/02/2023)   Received from [facility] Vital Sign    Worried About Running Out of Food in the Last Year: Never true    Ran Out of Food in the Last Year: Never true  Transportation Needs: No Transportation Needs (04/02/2023)   Received from [facility] - Transportation    Lack of Transportation (Medical): No    Lack of Transportation (Non-Medical): No  Physical Activity: Sufficiently Active (04/02/2023)   Received from [facility]   Exercise Vital Sign    Days of Exercise per Week: 4 days    Minutes of Exercise per Session: 40 min  Stress: No Stress Concern Present (04/02/2023)   Received from [facility] - Occupational Stress Questionnaire    Feeling of Stress : Only a little  Social Connections:  Moderately Integrated (04/02/2023)   Received from [facility]   Social Network    How would you rate your social network (family, work, friends)?: Adequate participation with social networks   Past Surgical History:  Procedure Laterality Date   ABDOMINAL HYSTERECTOMY     BACK SURGERY     BUNIONECTOMY Bilateral    EYE SURGERY     laser surgery   NASAL SINUS SURGERY     x2   REVERSE SHOULDER ARTHROPLASTY Right 07/03/2022   Procedure: REVERSE SHOULDER ARTHROPLASTY;  Surgeon: Beverely Low, MD;  Location: [facility];  Service: Orthopedics;  Laterality: Right;  120 min choice with interscalene block   SACROILIAC JOINT FUSION Bilateral 09/12/2020   Procedure: REMOVAL OF BILATERAL PELVIC SCREWS;  Surgeon: Estill Bamberg, MD;  Location: [facility] OR;  Service: Orthopedics;  Laterality: Bilateral;   SKIN CANCER EXCISION     face x3   TUBAL LIGATION     Past Medical History:  Diagnosis Date   Allergic rhinitis    Anxiety    Arthritis    Cancer (HCC)    skin   Chronic pain    Chronically dry eyes    Depression    Fibromyalgia    GERD (gastroesophageal reflux disease)    Headache    Osteoporosis    Sleep apnea    Stroke (HCC)    from gabapenting per pt no deficits   Vitamin D deficiency    Wears glasses    BP 123/79   Pulse 87   Ht 5\' 3"  (1.6 m)   Wt 136 lb (61.7 kg)   SpO2 98%   BMI 24.09 kg/m   Opioid Risk Score:   Fall Risk Score:  `1  Depression screen [facility] 2/9     02/18/2023   10:58 AM 10/26/2022   10:59 AM 09/28/2022   10:09 AM 07/27/2022   10:17 AM 05/25/2022   10:54 AM 10/06/2021   10:49 AM 03/25/2021   11:14 AM  Depression screen PHQ 2/9  Decreased Interest 0 0 0 1 0 0 0  Down, Depressed, Hopeless 0 0 0 1 0 0 0  PHQ - 2 Score 0 0 0 2 0 0 0    Review of Systems  Musculoskeletal:  Positive for myalgias.       Arm, leg, foot pain  Neurological:  Positive for numbness.  Psychiatric/Behavioral:  Positive for dysphoric mood. The patient is nervous/anxious.   All  other systems reviewed and are negative.      Objective:   Physical Exam Gen: no distress, normal appearing HEENT: oral mucosa pink and moist, NCAT Cardio:  Reg rate Chest: normal effort, normal rate of breathing Abd: soft, non-distended Ext: no edema Psych: pleasant, normal affect Skin: intact Neuro: Alert and oriented x3 Musculoskeletal: Diffuse pain and tenderness in upper and lower extremities. +trigger point injections in bilateral trapezius muscles     Assessment & Plan:  Mrs Munsch is a 67 year old woman who presents to establish care for fibromyalgia:  Chronic Pain Syndrome secondary to fibromyalgia -Discussed current symptoms of pain and history of pain.  -Discussed benefits of exercise in reducing pain. -start topamax 25mg  HS, can uptitrate in 3 days if well tolerated.  -Discussed following foods that may reduce pain: 1) Ginger (especially studied for arthritis)- reduce leukotriene production to decrease inflammation 2) Blueberries- high in phytonutrients that decrease inflammation 3) Salmon- marine omega-3s reduce joint swelling and pain 4) Pumpkin seeds- reduce inflammation 5) dark chocolate- reduces inflammation 6) turmeric- reduces inflammation 7) tart cherries - reduce pain and stiffness 8) extra virgin olive oil - its compound olecanthal helps to block prostaglandins  9) chili peppers- can be eaten or applied topically via capsaicin 10) mint- helpful for headache, muscle aches, joint pain, and itching 11) garlic- reduces inflammation  Link to further information on diet for chronic pain: http://www.bray.com/  2. Insomnia: -Try to go outside near sunrise -Get exercise during the day.  -Discussed good sleep hygiene: turning off all devices an hour before bedtime.  -Chamomile tea with dinner.  -Can consider over the counter melatonin  2) Anxiety: -Discussed exercise and meditation as  tools to decrease anxiety. -Recommended Down Dog Yoga app -Discussed spending time outdoors. -Discussed positive re-framing of anxiety.  -Discussed the following foods that have been show to reduce anxiety: 1) [place] nuts, mushrooms, soy beans due to their high selenium content. Upper limit of toxicity of selenium is 459mcg/day so no more than 3-4 [place] nuts per day.  2) Fatty fish such as salmon, mackerel, sardines, trout, and herring- high in omega-3 fatty acids 3) Eggs- increases serotonin and dopamine 4) Pumpkin seeds- high in omega-3 fatty acids 5) dark chocolate- high in flavanols that increase blood flow to brain 6) turmeric- take with black pepper to increase absorption 7) chamomile tea- antioxidant and anti-inflammatory properties 8) yogurt without sugar- supports gut-brain axis 9) green tea- contains L- theanine 10) blueberries- high in vitamin C and antioxidants 11) [place]- high in tryptophan which gets converted to serotonin 12) bell peppers- rich in vitamin C and antioxidants 13) citrus fruits- rich in vitamin C and antioxidants 14) almonds- high in vitamin E and healthy fats 15) chia seeds- high in omega-3 fatty acids  3) Cervical myofascial pain syndrome Trigger Point Injection  Indication: Cervical myofascial pain not relieved by medication management and other conservative care.  Informed consent was obtained after describing risk and benefits of the procedure with the patient, this includes bleeding, bruising, infection and medication side effects.  The patient wishes to proceed and has given written consent.  The patient was placed in a seated position.  The cervical area was marked and prepped with Betadine.  It was entered with a 25-gauge 1/2 inch needle and a total of 5 mL of 1% lidocaine was injected into a total of 4 trigger points, after negative draw back for blood.  The patient tolerated the procedure well.  Post procedure instructions were given.    -advised  heat to relax muscles -can repeat in 3 months if needed -call me to repeat earlier if needed -discussed benefits of TENs unit, can try for  her if trigger point injections not providing enough relief.  Prescribing Home Zynex NexWave Stimulator Device and supplies as needed. IFC, NMES and TENS medically necessary Treatment Rx: Daily @ 30-40 minutes per treatment PRN. Zynex NexWave only, no substitutions. Treatment Goals: 1) To reduce and/or eliminate pain 2) To improve functional capacity and Activities of daily living 3) To reduce or prevent the need for oral medications 4) To improve circulation in the injured region 5) To decrease or prevent muscle spasm and muscle atrophy 6) To provide a self-management tool to the patient The patient has not sufficiently improved with conservative care. Numerous studies indexed by Medline and PubMed.gov have shown Neuromuscular, Interferential, and TENS stimulators to reduce pain, improve function, and reduce medication use in injured patients. Continued use of this evidence based, safe, drug free treatment is both reasonable and medically necessary at this time.   4) Cold intolerance -very bothersome to her -present even in summer -thyroid low end of norma -prescribed levothyroxine to see if this helps, advised her to take 30-60 minutes before breakfast and to please let me know  5) Low vitamin D -continue current supplementation -advised regarding the benefits of high dose supplementation, can consider if symptoms fail to improve with above regimen. '  6) Neuropathy: -discussed her poor experience with SCS and that it has made her pain worse -discussed that she was started on Xtampza and it does not help her -discussed that she wants to switch to oxycodone TID prn -topamax does not help.  -discussed the differences between Xtampza and oxycodone in terms of release into the bloodstream -discussed that she is not getting relief from [place].    Prescribing Home Zynex NexWave Stimulator Device and supplies as needed. IFC, NMES and TENS medically necessary Treatment Rx: Daily @ 30-40 minutes per treatment PRN. Zynex NexWave only, no substitutions. Treatment Goals: 1) To reduce and/or eliminate pain 2) To improve functional capacity and Activities of daily living 3) To reduce or prevent the need for oral medications 4) To improve circulation in the injured region 5) To decrease or prevent muscle spasm and muscle atrophy 6) To provide a self-management tool to the patient The patient has not sufficiently improved with conservative care. Numerous studies indexed by Medline and PubMed.gov have shown Neuromuscular, Interferential, and TENS stimulators to reduce pain, improve function, and reduce medication use in injured patients. Continued use of this evidence based, safe, drug free treatment is both reasonable and medically necessary at this time.   -UDS and pain contract performed today, discussed that if contains the expected metabolites, I will prescribe oxycodone 5mg  TID prn

## 2023-05-03 NOTE — Addendum Note (Signed)
Addended by: Silas Sacramento T on: 05/03/2023 01:41 PM   Modules accepted: Orders

## 2023-05-03 NOTE — Addendum Note (Signed)
Addended by: Silas Sacramento T on: 05/03/2023 03:00 PM   Modules accepted: Orders

## 2023-05-03 NOTE — Progress Notes (Addendum)

## 2023-05-04 ENCOUNTER — Telehealth: Payer: Self-pay | Admitting: Psychiatry

## 2023-05-04 DIAGNOSIS — S92901K Unspecified fracture of right foot, subsequent encounter for fracture with nonunion: Secondary | ICD-10-CM | POA: Diagnosis not present

## 2023-05-04 DIAGNOSIS — Z9889 Other specified postprocedural states: Secondary | ICD-10-CM | POA: Diagnosis not present

## 2023-05-04 DIAGNOSIS — M2021 Hallux rigidus, right foot: Secondary | ICD-10-CM | POA: Diagnosis not present

## 2023-05-04 MED ORDER — NORTRIPTYLINE HCL 25 MG PO CAPS: 25.0000 mg | ORAL_CAPSULE | Freq: Every day | ORAL | 0 refills | Status: DC

## 2023-05-04 NOTE — Telephone Encounter (Signed)
Sent Rx for 25 mg to requested pharmacy per last note.  Discontinued any previous doses.

## 2023-05-04 NOTE — Telephone Encounter (Signed)
Pt called and would like to increase the nortriptyline to 25 mg. She said that this was discussed at last visit. Pharmacy is [facility] [address]

## 2023-05-05 ENCOUNTER — Other Ambulatory Visit: Payer: Self-pay | Admitting: Physical Medicine and Rehabilitation

## 2023-05-05 DIAGNOSIS — G4733 Obstructive sleep apnea (adult) (pediatric): Secondary | ICD-10-CM | POA: Diagnosis not present

## 2023-05-05 LAB — TOXASSURE SELECT,+ANTIDEPR,UR

## 2023-05-05 MED ORDER — OXYCODONE HCL 5 MG PO TABS
5.0000 mg | ORAL_TABLET | Freq: Three times a day (TID) | ORAL | 0 refills | Status: DC | PRN
Start: 2023-05-05 — End: 2023-06-02

## 2023-05-07 DIAGNOSIS — M542 Cervicalgia: Secondary | ICD-10-CM | POA: Diagnosis not present

## 2023-05-11 DIAGNOSIS — M961 Postlaminectomy syndrome, not elsewhere classified: Secondary | ICD-10-CM | POA: Diagnosis not present

## 2023-05-14 ENCOUNTER — Other Ambulatory Visit: Payer: Self-pay | Admitting: Psychiatry

## 2023-05-14 DIAGNOSIS — F331 Major depressive disorder, recurrent, moderate: Secondary | ICD-10-CM

## 2023-05-14 DIAGNOSIS — F4310 Post-traumatic stress disorder, unspecified: Secondary | ICD-10-CM

## 2023-05-19 DIAGNOSIS — M79643 Pain in unspecified hand: Secondary | ICD-10-CM | POA: Diagnosis not present

## 2023-05-19 DIAGNOSIS — M549 Dorsalgia, unspecified: Secondary | ICD-10-CM | POA: Diagnosis not present

## 2023-05-19 DIAGNOSIS — M0609 Rheumatoid arthritis without rheumatoid factor, multiple sites: Secondary | ICD-10-CM | POA: Diagnosis not present

## 2023-05-19 DIAGNOSIS — M25529 Pain in unspecified elbow: Secondary | ICD-10-CM | POA: Diagnosis not present

## 2023-05-19 DIAGNOSIS — M25569 Pain in unspecified knee: Secondary | ICD-10-CM | POA: Diagnosis not present

## 2023-05-19 DIAGNOSIS — Z79899 Other long term (current) drug therapy: Secondary | ICD-10-CM | POA: Diagnosis not present

## 2023-05-19 DIAGNOSIS — M1990 Unspecified osteoarthritis, unspecified site: Secondary | ICD-10-CM | POA: Diagnosis not present

## 2023-05-19 DIAGNOSIS — M79673 Pain in unspecified foot: Secondary | ICD-10-CM | POA: Diagnosis not present

## 2023-05-19 DIAGNOSIS — M797 Fibromyalgia: Secondary | ICD-10-CM | POA: Diagnosis not present

## 2023-05-25 ENCOUNTER — Telehealth: Payer: Self-pay | Admitting: Psychiatry

## 2023-05-25 NOTE — Telephone Encounter (Signed)
Called pharmacy. Not sure what the issue was, but they are filling it and it should be ready after 2:00 today. LVM per DPR with this information.

## 2023-05-25 NOTE — Telephone Encounter (Signed)
Pt called at 11:05a and the she said the pharmacy doesn't see the refills for Lunesta to   [id number] [address] [place], [place] [address] Phone: [phone]  Fax: [phone]   She's asking if someone can call to the pharmacy to see what the problem is.  Next appt 12/16

## 2023-05-26 ENCOUNTER — Other Ambulatory Visit: Payer: Self-pay | Admitting: Psychiatry

## 2023-05-27 DIAGNOSIS — M542 Cervicalgia: Secondary | ICD-10-CM | POA: Diagnosis not present

## 2023-05-28 DIAGNOSIS — G8929 Other chronic pain: Secondary | ICD-10-CM | POA: Diagnosis not present

## 2023-05-28 DIAGNOSIS — M961 Postlaminectomy syndrome, not elsewhere classified: Secondary | ICD-10-CM | POA: Diagnosis not present

## 2023-05-28 DIAGNOSIS — I7300 Raynaud's syndrome without gangrene: Secondary | ICD-10-CM | POA: Diagnosis not present

## 2023-05-28 DIAGNOSIS — F330 Major depressive disorder, recurrent, mild: Secondary | ICD-10-CM | POA: Diagnosis not present

## 2023-05-28 DIAGNOSIS — R202 Paresthesia of skin: Secondary | ICD-10-CM | POA: Diagnosis not present

## 2023-05-28 DIAGNOSIS — G4700 Insomnia, unspecified: Secondary | ICD-10-CM | POA: Diagnosis not present

## 2023-05-28 DIAGNOSIS — M5441 Lumbago with sciatica, right side: Secondary | ICD-10-CM | POA: Diagnosis not present

## 2023-05-28 DIAGNOSIS — M797 Fibromyalgia: Secondary | ICD-10-CM | POA: Diagnosis not present

## 2023-05-28 DIAGNOSIS — M5442 Lumbago with sciatica, left side: Secondary | ICD-10-CM | POA: Diagnosis not present

## 2023-05-31 ENCOUNTER — Telehealth: Payer: Self-pay | Admitting: Physical Medicine and Rehabilitation

## 2023-05-31 ENCOUNTER — Encounter: Payer: Medicare HMO | Admitting: Physical Medicine and Rehabilitation

## 2023-05-31 ENCOUNTER — Encounter: Payer: Self-pay | Admitting: Physical Medicine and Rehabilitation

## 2023-05-31 ENCOUNTER — Ambulatory Visit (INDEPENDENT_AMBULATORY_CARE_PROVIDER_SITE_OTHER): Payer: No Typology Code available for payment source | Admitting: Psychiatry

## 2023-05-31 ENCOUNTER — Encounter: Payer: Self-pay | Admitting: Psychiatry

## 2023-05-31 DIAGNOSIS — F4310 Post-traumatic stress disorder, unspecified: Secondary | ICD-10-CM | POA: Diagnosis not present

## 2023-05-31 DIAGNOSIS — M7918 Myalgia, other site: Secondary | ICD-10-CM

## 2023-05-31 DIAGNOSIS — F331 Major depressive disorder, recurrent, moderate: Secondary | ICD-10-CM

## 2023-05-31 DIAGNOSIS — G4700 Insomnia, unspecified: Secondary | ICD-10-CM

## 2023-05-31 MED ORDER — AMITRIPTYLINE HCL 25 MG PO TABS
ORAL_TABLET | ORAL | 3 refills | Status: DC
Start: 2023-05-31 — End: 2023-07-13

## 2023-05-31 NOTE — Progress Notes (Signed)
Sharon Logan [id number] [birth year] 67 y.o.  Subjective:   Patient ID:  Sharon Logan is a 67 y.o. (DOB March 15, 1956) female.  Chief Complaint:  Chief Complaint  Patient presents with   Depression    HPI Sharon Logan presents to the office today for follow-up of depression, anxiety, and insomnia. She reports that she would like to be switched to Amitriptyline since it has helped her pain more than Nortriptyline. She thinks her pain overshadows any benefit she is having with Nortriptyline in terms of her depression. She has been reducing Nortriptyline to every other day. She has been experiencing persistent depression. She reports increased anxiety. Denies panic. "I get irritated at everything." She reports anxious thoughts. Sleeping at least 5 hours of sleep a night with Lunesta with some middle of the night awakenings. Appetite is fair. Most food is not appealing to her. She reports difficulty with concentration and sustaining focus. Denies SI.   She has had her spinal stimulator off and "it is nice not to have nerve pain all over my body."  Past Medication Trials: Cymbalta- Started after back surgery. Has made her tired, even when reduced to 30 mg. Was having to take 2 hour naps. May have helped slightly with pain. Had HA, Constipation, Nausea Savella- muscle aches, rhabdomyolysis Effexor-HA, helped with excessive sweating.  Pristiq- May have increased depression. Prozac- Constipation. Causes her to feel cold.  Celexa- Worsening depression at 20 mg dose Lexapro- "My anti-depressant of choice" and gives her energy. Was taking 2-3 years prior to surgery. Re-started one week ago. Has taken 10 mg and 20 mg doses in the past. Thinks it may have helped with pain in the past. Zoloft- worsening mood Paxil Viibryd- Felt depression was more "heavy." Wellbutrin- Did not cause HA's. Tolerated well and was effective for depression. Auvelity- "felt drugged" and was forgetting appointments.   Amitriptyline- Did not cause HA's. Weight gain. Effective.  Nortriptyline- Did not cause HA's. Effective. Lamictal- Rash Gabapentin -Rhabomyolysis Lyrica- Adverse reaction Buspar- Helpful for anxiety in the past. Well-tolerated. Tried again and had severe anxiety.  Trazodone- helped with sleep initiation but sleep was not restful. Severe dry mouth. Ambien- Parasomnia Lunesta- Tolerated, effective at 2 mg dose Remeron- Dry eyes, internal restlessness. Stomach discomfort.  Clonidine Topamax- ineffective for headaches. Caused cognitive side effects.  Hydroxyzine- Initially helped with anxiety and sleep initiation, then no longer as helpful  PHQ2-9    Flowsheet Row Office Visit from 02/18/2023 in [facility] Dept Of [facility] Nutrition from 10/26/2022 in [facility]  - A Dept Of [facility] Office Visit from 09/28/2022 in [facility] Dept Of [facility] Office Visit from 07/27/2022 in [facility] A Dept Of [facility] Office Visit from 05/25/2022 in [facility] - [facility] Dept Of [facility]  PHQ-2 Total Score 0 0 0 2 0      Flowsheet Row Admission (Discharged) from 07/03/2022 in [facility] Pre-Admission Testing 60 from 06/18/2022 in [facility] from 10/12/2021 in [facility] Emergency Department at [facility]  C-SSRS RISK CATEGORY No Risk No Risk No Risk        Review of Systems:  Review of Systems  Musculoskeletal:  Positive for back pain. Negative for gait problem.  Reports lower back pain that is affecting her hips. Foot pain and has surgery planned.   Neurological:        Nerve pain  Psychiatric/Behavioral:         Please refer to HPI    Medications: I have reviewed the patient's current medications.  Current Outpatient  Medications  Medication Sig Dispense Refill   acetaminophen (TYLENOL) 325 MG tablet Take 650 mg by mouth every 6 (six) hours as needed for moderate pain.     amLODipine (NORVASC) 5 MG tablet Take 5 mg by mouth daily.     b complex vitamins capsule Take 1 capsule by mouth daily.     baclofen (LIORESAL) 10 MG tablet Take 10 mg by mouth 3 (three) times daily. Reports taking 2-3 times daily     BIOTIN PO Take by mouth.     Carboxymethylcellulose Sodium (THERATEARS OP) Place 1 drop into both eyes 4 (four) times daily as needed (dry eyes).     cetirizine (ZYRTEC) 10 MG tablet Take 10 mg by mouth daily as needed for allergies.     Cholecalciferol (VITAMIN D3) 25 MCG (1000 UT) CAPS Take by mouth.     diclofenac Sodium (VOLTAREN) 1 % GEL Apply 2 g topically daily as needed (pain).     eszopiclone (LUNESTA) 2 MG TABS tablet Take 1 tablet (2 mg total) by mouth at bedtime as needed for sleep. Take immediately before bedtime 30 tablet 5   fluticasone (FLONASE) 50 MCG/ACT nasal spray Place 2 sprays into both nostrils 2 (two) times daily as needed for allergies or rhinitis.     lidocaine (LIDODERM) 5 % Place 1 patch onto the skin daily.     magnesium 30 MG tablet Take 30 mg by mouth at bedtime.     oxyCODONE (ROXICODONE) 5 MG immediate release tablet Take 1 tablet (5 mg total) by mouth 3 (three) times daily as needed for severe pain (pain score 7-10). 90 tablet 0   rizatriptan (MAXALT) 10 MG tablet Take 10 mg by mouth daily as needed for migraine.      saccharomyces boulardii (FLORASTOR) 250 MG capsule Take 250 mg by mouth daily.     amitriptyline (ELAVIL) 25 MG tablet Take 1 tablet at bedtime for one week, then increase to 2 tablets at bedtime as tolerated 60 tablet 3   EPINEPHrine 0.3 mg/0.3 mL IJ SOAJ injection Inject 0.3 mg into the muscle as needed for anaphylaxis.     No current facility-administered medications for this visit.    Medication Side Effects: Other: Dry mouth  Allergies:  Allergies   Allergen Reactions   Gabapentin Other (See Comments)    Delirium, stroke like symptoms  Other Reaction(s): Myalgia, Other  Has stroke symptoms when taking this with percocet.     Other reaction(s): Delirium    Delirium, stroke like symptoms   Amitriptyline Other (See Comments)   Amlodipine Other (See Comments)    headaches   Polymyxin B Other (See Comments)    Eyes go blood red    Pregabalin Itching and Rash    Itchy red rash on chest  Other Reaction(s): other   Zolpidem Rash    "sleep walk"    Cymbalta [Duloxetine Hcl] Other (See Comments)    Headaches, constipation   Fluoxetine Other (See Comments)    CONSTIPATION    Lamotrigine Rash   Other Other (See Comments)    OTOBIOTIC > RED EYES    Past Medical History:  Diagnosis Date   Allergic  rhinitis    Anxiety    Arthritis    Cancer (HCC)    skin   Chronic pain    Chronically dry eyes    Depression    Fibromyalgia    GERD (gastroesophageal reflux disease)    Headache    Osteoporosis    Sleep apnea    Stroke (HCC)    from gabapenting per pt no deficits   Vitamin D deficiency    Wears glasses     Past Medical History, Surgical history, Social history, and Family history were reviewed and updated as appropriate.   Please see review of systems for further details on the patient's review from today.   Objective:   Physical Exam:  There were no vitals taken for this visit.  Physical Exam Constitutional:      General: She is not in acute distress. Musculoskeletal:        General: No deformity.  Neurological:     Mental Status: She is alert and oriented to person, place, and time.     Coordination: Coordination normal.  Psychiatric:        Attention and Perception: Attention and perception normal. She does not perceive auditory or visual hallucinations.        Mood and Affect: Mood is anxious and depressed. Affect is not labile, blunt, angry or inappropriate.        Speech: Speech normal.         Behavior: Behavior normal.        Thought Content: Thought content normal. Thought content is not paranoid or delusional. Thought content does not include homicidal or suicidal ideation. Thought content does not include homicidal or suicidal plan.        Cognition and Memory: Cognition and memory normal.        Judgment: Judgment normal.     Comments: Insight intact     Lab Review:     Component Value Date/Time   NA 140 07/04/2022 0407   NA 142 06/03/2021 1623   K 3.9 07/04/2022 0407   CL 109 07/04/2022 0407   CO2 26 07/04/2022 0407   GLUCOSE 95 07/04/2022 0407   BUN 15 07/04/2022 0407   BUN 21 06/03/2021 1623   CREATININE 0.73 07/04/2022 0407   CALCIUM 8.1 (L) 07/04/2022 0407   PROT 7.4 04/21/2021 0910   ALBUMIN 3.8 04/21/2021 0910   AST 26 04/21/2021 0910   ALT 21 04/21/2021 0910   ALKPHOS 120 04/21/2021 0910   BILITOT 0.7 04/21/2021 0910   GFRNONAA >60 07/04/2022 0407   GFRAA >60 06/26/2019 1008       Component Value Date/Time   WBC 7.0 06/18/2022 1036   RBC 4.71 06/18/2022 1036   HGB 10.8 (L) 07/04/2022 0407   HGB 12.1 06/03/2021 1622   HCT 32.8 (L) 07/04/2022 0407   HCT 37.6 06/03/2021 1622   PLT 294 06/18/2022 1036   PLT 336 06/03/2021 1622   MCV 94.5 06/18/2022 1036   MCV 92 06/03/2021 1622   MCH 29.9 06/18/2022 1036   MCHC 31.7 06/18/2022 1036   RDW 12.8 06/18/2022 1036   RDW 12.8 06/03/2021 1622   LYMPHSABS 2.0 06/03/2021 1622   MONOABS 0.5 04/21/2021 0910   EOSABS 0.1 06/03/2021 1622   BASOSABS 0.0 06/03/2021 1622    No results found for: "POCLITH", "LITHIUM"   No results found for: "PHENYTOIN", "PHENOBARB", "VALPROATE", "CBMZ"   .res Assessment: Plan:   29 minutes spent dedicated to the care of this patient on the date  of this encounter to include pre-visit review of records, ordering of medication, post visit documentation, and face-to-face time with the patient discussing treatment plan and how pain impacts her mental health. Will discontinue  Nortriptyline since pt reports that she has not seen any benefit with pain and feels that severe pain is the primary driver of her depression. She reports that she would like to resume Amitriptyline instead of Nortriptyline since it seemed to have some benefit for pain, which in turn helped with her depression and anxiety. Will re-start Amitriptyline 25 mg at bedtime for depression. Discussed that she may increase to 50 mg at bedtime after one week if needed/a tolerated.  Continue Lunesta 2 mg at bedtime as needed for insomnia.  Pt to follow-up with Melony Overly, PA in 4-6 weeks or sooner if clinically indicated.  Patient advised to contact office with any questions, adverse effects, or acute worsening in signs and symptoms.   Docia was seen today for depression.  Diagnoses and all orders for this visit:  PTSD (post-traumatic stress disorder) -     amitriptyline (ELAVIL) 25 MG tablet; Take 1 tablet at bedtime for one week, then increase to 2 tablets at bedtime as tolerated  Insomnia, unspecified type -     amitriptyline (ELAVIL) 25 MG tablet; Take 1 tablet at bedtime for one week, then increase to 2 tablets at bedtime as tolerated  Moderate recurrent major depression (HCC) -     amitriptyline (ELAVIL) 25 MG tablet; Take 1 tablet at bedtime for one week, then increase to 2 tablets at bedtime as tolerated     Please see After Visit Summary for patient specific instructions.  Future Appointments  Date Time Provider Department Center  07/13/2023 11:20 AM Raulkar, Drema Pry, MD [facility] CPR  07/16/2023 11:00 AM Melony Overly T, PA-C [facility] None  10/04/2023 10:30 AM Drema Dallas, DO [facility] None    No orders of the defined types were placed in this encounter.   -------------------------------

## 2023-05-31 NOTE — Telephone Encounter (Signed)
Patient would like for Dr. Carlis Abbott to call her.  She needs a refill on oxycodone, but wants to increase the dose.  Please call patient.

## 2023-06-01 DIAGNOSIS — M2021 Hallux rigidus, right foot: Secondary | ICD-10-CM | POA: Diagnosis not present

## 2023-06-01 DIAGNOSIS — Z9889 Other specified postprocedural states: Secondary | ICD-10-CM | POA: Diagnosis not present

## 2023-06-01 DIAGNOSIS — S92901K Unspecified fracture of right foot, subsequent encounter for fracture with nonunion: Secondary | ICD-10-CM | POA: Diagnosis not present

## 2023-06-02 ENCOUNTER — Other Ambulatory Visit: Payer: Self-pay | Admitting: Physical Medicine and Rehabilitation

## 2023-06-02 MED ORDER — OXYCODONE-ACETAMINOPHEN 10-325 MG PO TABS: 1.0000 | ORAL_TABLET | Freq: Four times a day (QID) | ORAL | 0 refills | Status: DC | PRN

## 2023-06-02 MED ORDER — BACLOFEN 10 MG PO TABS: 10.0000 mg | ORAL_TABLET | Freq: Three times a day (TID) | ORAL | 3 refills | Status: DC | PRN

## 2023-06-03 DIAGNOSIS — M797 Fibromyalgia: Secondary | ICD-10-CM | POA: Diagnosis not present

## 2023-06-03 DIAGNOSIS — M47812 Spondylosis without myelopathy or radiculopathy, cervical region: Secondary | ICD-10-CM | POA: Diagnosis not present

## 2023-06-03 DIAGNOSIS — M5134 Other intervertebral disc degeneration, thoracic region: Secondary | ICD-10-CM | POA: Diagnosis not present

## 2023-06-03 DIAGNOSIS — Z9682 Presence of neurostimulator: Secondary | ICD-10-CM | POA: Diagnosis not present

## 2023-06-03 DIAGNOSIS — Z981 Arthrodesis status: Secondary | ICD-10-CM | POA: Diagnosis not present

## 2023-06-04 DIAGNOSIS — N289 Disorder of kidney and ureter, unspecified: Secondary | ICD-10-CM | POA: Diagnosis not present

## 2023-06-04 DIAGNOSIS — S92901K Unspecified fracture of right foot, subsequent encounter for fracture with nonunion: Secondary | ICD-10-CM | POA: Diagnosis not present

## 2023-06-05 DIAGNOSIS — J988 Other specified respiratory disorders: Secondary | ICD-10-CM | POA: Diagnosis not present

## 2023-06-06 DIAGNOSIS — M542 Cervicalgia: Secondary | ICD-10-CM | POA: Diagnosis not present

## 2023-06-11 DIAGNOSIS — S92902A Unspecified fracture of left foot, initial encounter for closed fracture: Secondary | ICD-10-CM | POA: Diagnosis not present

## 2023-06-11 DIAGNOSIS — T84223A Displacement of internal fixation device of bones of foot and toes, initial encounter: Secondary | ICD-10-CM | POA: Diagnosis not present

## 2023-06-11 DIAGNOSIS — M86671 Other chronic osteomyelitis, right ankle and foot: Secondary | ICD-10-CM | POA: Diagnosis not present

## 2023-06-11 DIAGNOSIS — M205X1 Other deformities of toe(s) (acquired), right foot: Secondary | ICD-10-CM | POA: Diagnosis not present

## 2023-06-11 DIAGNOSIS — Z79899 Other long term (current) drug therapy: Secondary | ICD-10-CM | POA: Diagnosis not present

## 2023-06-11 DIAGNOSIS — M00872 Arthritis due to other bacteria, left ankle and foot: Secondary | ICD-10-CM | POA: Diagnosis not present

## 2023-06-11 DIAGNOSIS — M79671 Pain in right foot: Secondary | ICD-10-CM | POA: Diagnosis not present

## 2023-06-11 DIAGNOSIS — T849XXD Unspecified complication of internal orthopedic prosthetic device, implant and graft, subsequent encounter: Secondary | ICD-10-CM | POA: Diagnosis not present

## 2023-06-11 DIAGNOSIS — T849XXA Unspecified complication of internal orthopedic prosthetic device, implant and graft, initial encounter: Secondary | ICD-10-CM | POA: Diagnosis not present

## 2023-06-11 DIAGNOSIS — M2061 Acquired deformities of toe(s), unspecified, right foot: Secondary | ICD-10-CM | POA: Diagnosis not present

## 2023-06-11 DIAGNOSIS — K219 Gastro-esophageal reflux disease without esophagitis: Secondary | ICD-10-CM | POA: Diagnosis not present

## 2023-06-11 DIAGNOSIS — M009 Pyogenic arthritis, unspecified: Secondary | ICD-10-CM | POA: Diagnosis not present

## 2023-06-11 DIAGNOSIS — Z885 Allergy status to narcotic agent status: Secondary | ICD-10-CM | POA: Diagnosis not present

## 2023-06-11 DIAGNOSIS — E785 Hyperlipidemia, unspecified: Secondary | ICD-10-CM | POA: Diagnosis not present

## 2023-06-11 DIAGNOSIS — Z888 Allergy status to other drugs, medicaments and biological substances status: Secondary | ICD-10-CM | POA: Diagnosis not present

## 2023-06-11 DIAGNOSIS — Z472 Encounter for removal of internal fixation device: Secondary | ICD-10-CM | POA: Diagnosis not present

## 2023-06-11 DIAGNOSIS — S92901K Unspecified fracture of right foot, subsequent encounter for fracture with nonunion: Secondary | ICD-10-CM | POA: Diagnosis not present

## 2023-06-11 DIAGNOSIS — S92311K Displaced fracture of first metatarsal bone, right foot, subsequent encounter for fracture with nonunion: Secondary | ICD-10-CM | POA: Diagnosis not present

## 2023-06-23 ENCOUNTER — Other Ambulatory Visit: Payer: Self-pay | Admitting: Psychiatry

## 2023-06-23 DIAGNOSIS — F331 Major depressive disorder, recurrent, moderate: Secondary | ICD-10-CM

## 2023-06-23 DIAGNOSIS — F4310 Post-traumatic stress disorder, unspecified: Secondary | ICD-10-CM

## 2023-06-23 DIAGNOSIS — G4700 Insomnia, unspecified: Secondary | ICD-10-CM

## 2023-06-29 ENCOUNTER — Encounter: Payer: Self-pay | Admitting: Physical Medicine and Rehabilitation

## 2023-06-29 ENCOUNTER — Other Ambulatory Visit: Payer: Self-pay | Admitting: Physical Medicine and Rehabilitation

## 2023-06-29 MED ORDER — OXYCODONE-ACETAMINOPHEN 10-325 MG PO TABS: 1.0000 | ORAL_TABLET | Freq: Four times a day (QID) | ORAL | 0 refills | Status: DC | PRN

## 2023-07-05 ENCOUNTER — Encounter: Payer: Self-pay | Admitting: Physician Assistant

## 2023-07-05 ENCOUNTER — Ambulatory Visit (INDEPENDENT_AMBULATORY_CARE_PROVIDER_SITE_OTHER): Payer: No Typology Code available for payment source | Admitting: Physician Assistant

## 2023-07-05 DIAGNOSIS — F339 Major depressive disorder, recurrent, unspecified: Secondary | ICD-10-CM | POA: Diagnosis not present

## 2023-07-05 DIAGNOSIS — F4310 Post-traumatic stress disorder, unspecified: Secondary | ICD-10-CM

## 2023-07-05 DIAGNOSIS — G4700 Insomnia, unspecified: Secondary | ICD-10-CM | POA: Diagnosis not present

## 2023-07-05 DIAGNOSIS — G4733 Obstructive sleep apnea (adult) (pediatric): Secondary | ICD-10-CM

## 2023-07-05 MED ORDER — ESCITALOPRAM OXALATE 10 MG PO TABS: ORAL_TABLET | ORAL | 1 refills | Status: DC

## 2023-07-05 NOTE — Progress Notes (Signed)
[facility]  Patient ID: Sharon Logan,  MRN: [id number]  PCP: Stevphen Rochester, MD  Date of Evaluation: 07/05/2023 Time spent:30 minutes  Chief Complaint:  Chief Complaint   Depression; Insomnia; Follow-up    HISTORY/CURRENT STATUS: HPI transferring to my care from Corie Chiquito, NP, who is no longer in our practice.   States she has been more anxious and depressed lately.  From reviewing Jessica's notes and discussing with patient, this has been a decades long battle.  Patient feels like it might be a little worse in the past few months though.  She restarted Lexapro a week ago.  She has taken that in the past and that has been helpful.  She has a hard time enjoying things.  Energy and motivation are low.  Appetite is normal and weight is stable.  ADLs and personal hygiene are normal.  No hopelessness reported.  No panic attacks but she does get overwhelmed easily.  Started using CPAP in the past few weeks. Hard time getting used to it.  No suicidal or homicidal thoughts.  She still has a lot of pain from fibromyalgia and foot and ankle pain due to complications of surgery.  Also has chronic hip and back pain, she is under the care of ortho and pain management for these problems.  Patient denies increased energy with decreased need for sleep, increased talkativeness, racing thoughts, impulsivity or risky behaviors, increased spending, increased libido, grandiosity, increased irritability or anger, paranoia, or hallucinations.  Review of Systems  Constitutional: Negative.   HENT: Negative.    Eyes: Negative.   Respiratory: Negative.    Cardiovascular: Negative.   Gastrointestinal: Negative.   Genitourinary: Negative.   Musculoskeletal:        See HPI  Skin: Negative.   Neurological: Negative.   Endo/Heme/Allergies: Negative.   Psychiatric/Behavioral:         See HPI    Individual Medical History/ Review of Systems: Changes? :Yes see HPI  Past Medication  Trials: Cymbalta- Started after back surgery. Has made her tired, even when reduced to 30 mg. Was having to take 2 hour naps. May have helped slightly with pain. Had HA, Constipation, Nausea Savella- muscle aches, rhabdomyolysis Effexor-HA, helped with excessive sweating.  Pristiq- May have increased depression. Prozac- Constipation. Causes her to feel cold.  Celexa- Worsening depression at 20 mg dose Lexapro- "My anti-depressant of choice" and gives her energy. Was taking 2-3 years prior to surgery. Re-started one week ago. Has taken 10 mg and 20 mg doses in the past. Thinks it may have helped with pain in the past. Zoloft- worsening mood Paxil Viibryd- Felt depression was more "heavy." Wellbutrin- Did not cause HA's. Tolerated well and was effective for depression. Auvelity- "felt drugged" and was forgetting appointments.  Amitriptyline- Did not cause HA's. Weight gain. Effective. Caused dry mouth. Nortriptyline- Did not cause HA's. Effective. Lamictal- Rash Gabapentin -Rhabomyolysis Lyrica- Adverse reaction Buspar- Helpful for anxiety in the past. Well-tolerated. Tried again and had severe anxiety.  Trazodone- helped with sleep initiation but sleep was not restful. Severe dry mouth. Ambien- Parasomnia, sleep walking Lunesta- Tolerated, effective at 2 mg dose Remeron- Dry eyes, internal restlessness. Stomach discomfort.  Clonidine Topamax- ineffective for headaches. Caused cognitive side effects.  Hydroxyzine- Initially helped with anxiety and sleep initiation, then no longer as helpful  Allergies: Gabapentin, Amitriptyline, Amlodipine, Polymyxin b, Pregabalin, Zolpidem, Cymbalta [duloxetine hcl], Fluoxetine, Lamotrigine, and Other  Current Medications:  Current Outpatient Medications:    amLODipine (NORVASC) 5 MG  tablet, Take 5 mg by mouth daily., Disp: , Rfl:    b complex vitamins capsule, Take 1 capsule by mouth daily., Disp: , Rfl:    baclofen (LIORESAL) 10 MG tablet, Take 1  tablet (10 mg total) by mouth 3 (three) times daily as needed for muscle spasms. Reports taking 2-3 times daily, Disp: 90 each, Rfl: 3   BIOTIN PO, Take by mouth., Disp: , Rfl:    Carboxymethylcellulose Sodium (THERATEARS OP), Place 1 drop into both eyes 4 (four) times daily as needed (dry eyes)., Disp: , Rfl:    cetirizine (ZYRTEC) 10 MG tablet, Take 10 mg by mouth daily as needed for allergies., Disp: , Rfl:    Cholecalciferol (VITAMIN D3) 25 MCG (1000 UT) CAPS, Take by mouth., Disp: , Rfl:    diclofenac Sodium (VOLTAREN) 1 % GEL, Apply 2 g topically daily as needed (pain)., Disp: , Rfl:    escitalopram (LEXAPRO) 10 MG tablet, 1/2 po every day for 2 weeks, then increase to 1 po every day., Disp: 30 tablet, Rfl: 1   eszopiclone (LUNESTA) 2 MG TABS tablet, Take 1 tablet (2 mg total) by mouth at bedtime as needed for sleep. Take immediately before bedtime, Disp: 30 tablet, Rfl: 5   fluticasone (FLONASE) 50 MCG/ACT nasal spray, Place 2 sprays into both nostrils 2 (two) times daily as needed for allergies or rhinitis., Disp: , Rfl:    lidocaine (LIDODERM) 5 %, Place 1 patch onto the skin daily., Disp: , Rfl:    magnesium 30 MG tablet, Take 30 mg by mouth at bedtime., Disp: , Rfl:    magnesium gluconate (MAGONATE) 500 MG tablet, Take 500 mg by mouth 2 (two) times daily., Disp: , Rfl:    saccharomyces boulardii (FLORASTOR) 250 MG capsule, Take 250 mg by mouth daily., Disp: , Rfl:    EPINEPHrine 0.3 mg/0.3 mL IJ SOAJ injection, Inject 0.3 mg into the muscle as needed for anaphylaxis., Disp: , Rfl:    oxyCODONE-acetaminophen (PERCOCET) 10-325 MG tablet, Take 1 tablet by mouth every 6 (six) hours as needed for pain., Disp: 120 tablet, Rfl: 0   rizatriptan (MAXALT) 10 MG tablet, Take 1 tablet (10 mg total) by mouth daily as needed for migraine., Disp: 90 tablet, Rfl: 3 Medication Side Effects: none  Family Medical/ Social History: Changes? No  MENTAL HEALTH EXAM:  There were no vitals taken for this  visit.There is no height or weight on file to calculate BMI.  General Appearance: Casual and Well Groomed  Eye Contact:  Good  Speech:  Clear and Coherent and Normal Rate  Volume:  Normal  Mood:  Depressed  Affect:  Congruent  Thought Process:  Goal Directed and Descriptions of Associations: Circumstantial  Orientation:  Full (Time, Place, and Person)  Thought Content: Logical   Suicidal Thoughts:  No  Homicidal Thoughts:  No  Memory:  WNL  Judgement:  Good  Insight:  Good  Psychomotor Activity:  Normal  Concentration:  Concentration: Good  Recall:  Good  Fund of Knowledge: Good  Language: Good  Assets:  Desire for Improvement Financial Resources/Insurance Housing Transportation Vocational/Educational  ADL's:  Intact  Cognition: WNL  Prognosis:  Good   DIAGNOSES:    ICD-10-CM   1. PTSD (post-traumatic stress disorder)  F43.10     2. Insomnia, unspecified type  G47.00     3. Major depression, recurrent, chronic (HCC)  F33.9     4. Obstructive sleep apnea  G47.33      Receiving Psychotherapy: No  RECOMMENDATIONS:   PDMP reviewed. Lunesta filled 06/26/2023. Oxycodone known to [place].  I provided 30 minutes of face to face time during this encounter, including time spent before and after the visit in records review, medical decision making, counseling pertinent to today's visit, and charting.   We discussed her symptoms.  I think restarting the Lexapro was a good idea.  She understands it can take 4 to 6 weeks to become fully efficacious.  Continue Lexapro 10 mg, one half daily for 2 weeks and then increase to 1 pill daily. Continue Lunesta 2 mg nightly as needed. Continue vitamins as per med list. Continue CPAP machine. Return in 6 weeks. Melony Overly, PA-C

## 2023-07-07 DIAGNOSIS — M542 Cervicalgia: Secondary | ICD-10-CM | POA: Diagnosis not present

## 2023-07-08 ENCOUNTER — Ambulatory Visit: Payer: Medicare HMO | Admitting: Physical Medicine and Rehabilitation

## 2023-07-09 DIAGNOSIS — M25551 Pain in right hip: Secondary | ICD-10-CM | POA: Diagnosis not present

## 2023-07-09 DIAGNOSIS — R202 Paresthesia of skin: Secondary | ICD-10-CM | POA: Diagnosis not present

## 2023-07-09 DIAGNOSIS — M5412 Radiculopathy, cervical region: Secondary | ICD-10-CM | POA: Diagnosis not present

## 2023-07-09 DIAGNOSIS — H18413 Arcus senilis, bilateral: Secondary | ICD-10-CM | POA: Diagnosis not present

## 2023-07-09 DIAGNOSIS — M47812 Spondylosis without myelopathy or radiculopathy, cervical region: Secondary | ICD-10-CM | POA: Diagnosis not present

## 2023-07-09 DIAGNOSIS — M961 Postlaminectomy syndrome, not elsewhere classified: Secondary | ICD-10-CM | POA: Diagnosis not present

## 2023-07-09 DIAGNOSIS — M79601 Pain in right arm: Secondary | ICD-10-CM | POA: Diagnosis not present

## 2023-07-09 DIAGNOSIS — H25043 Posterior subcapsular polar age-related cataract, bilateral: Secondary | ICD-10-CM | POA: Diagnosis not present

## 2023-07-09 DIAGNOSIS — M79602 Pain in left arm: Secondary | ICD-10-CM | POA: Diagnosis not present

## 2023-07-09 DIAGNOSIS — H2513 Age-related nuclear cataract, bilateral: Secondary | ICD-10-CM | POA: Diagnosis not present

## 2023-07-09 DIAGNOSIS — G894 Chronic pain syndrome: Secondary | ICD-10-CM | POA: Diagnosis not present

## 2023-07-09 DIAGNOSIS — H25013 Cortical age-related cataract, bilateral: Secondary | ICD-10-CM | POA: Diagnosis not present

## 2023-07-12 DIAGNOSIS — T85192D Other mechanical complication of implanted electronic neurostimulator (electrode) of spinal cord, subsequent encounter: Secondary | ICD-10-CM | POA: Diagnosis not present

## 2023-07-13 ENCOUNTER — Encounter: Payer: Medicare HMO | Attending: Physical Medicine and Rehabilitation | Admitting: Physical Medicine and Rehabilitation

## 2023-07-13 VITALS — BP 128/80 | HR 66 | Ht 63.0 in | Wt 136.0 lb

## 2023-07-13 DIAGNOSIS — M7918 Myalgia, other site: Secondary | ICD-10-CM | POA: Diagnosis not present

## 2023-07-13 MED ORDER — RIZATRIPTAN BENZOATE 10 MG PO TABS: 10.0000 mg | ORAL_TABLET | Freq: Every day | ORAL | 3 refills | Status: DC | PRN

## 2023-07-13 MED ORDER — LIDOCAINE HCL 1 % IJ SOLN
3.0000 mL | Freq: Once | INTRAMUSCULAR | Status: AC
  Administered 2023-07-13: 3 mL

## 2023-07-13 MED ORDER — SODIUM CHLORIDE (PF) 0.9 % IJ SOLN
2.0000 mL | Freq: Once | INTRAMUSCULAR | Status: AC
  Administered 2023-07-13: 2 mL via INTRAVENOUS

## 2023-07-13 MED ORDER — OXYCODONE-ACETAMINOPHEN 10-325 MG PO TABS: 1.0000 | ORAL_TABLET | Freq: Four times a day (QID) | ORAL | 0 refills | Status: DC | PRN

## 2023-07-13 NOTE — Patient Instructions (Signed)
Red light therapy

## 2023-07-13 NOTE — Addendum Note (Signed)
Addended by: Silas Sacramento T on: 07/13/2023 03:44 PM   Modules accepted: Orders

## 2023-07-13 NOTE — Progress Notes (Signed)

## 2023-07-16 ENCOUNTER — Ambulatory Visit: Payer: Medicare HMO | Admitting: Physician Assistant

## 2023-07-20 DIAGNOSIS — M961 Postlaminectomy syndrome, not elsewhere classified: Secondary | ICD-10-CM | POA: Diagnosis not present

## 2023-07-27 DIAGNOSIS — Z4789 Encounter for other orthopedic aftercare: Secondary | ICD-10-CM | POA: Diagnosis not present

## 2023-07-27 DIAGNOSIS — Z981 Arthrodesis status: Secondary | ICD-10-CM | POA: Diagnosis not present

## 2023-08-03 DIAGNOSIS — G4700 Insomnia, unspecified: Secondary | ICD-10-CM | POA: Diagnosis not present

## 2023-08-03 DIAGNOSIS — G4733 Obstructive sleep apnea (adult) (pediatric): Secondary | ICD-10-CM | POA: Diagnosis not present

## 2023-08-07 DIAGNOSIS — M542 Cervicalgia: Secondary | ICD-10-CM | POA: Diagnosis not present

## 2023-08-09 DIAGNOSIS — R202 Paresthesia of skin: Secondary | ICD-10-CM | POA: Diagnosis not present

## 2023-08-09 DIAGNOSIS — R200 Anesthesia of skin: Secondary | ICD-10-CM | POA: Diagnosis not present

## 2023-08-09 DIAGNOSIS — M791 Myalgia, unspecified site: Secondary | ICD-10-CM | POA: Diagnosis not present

## 2023-08-12 ENCOUNTER — Encounter: Payer: Self-pay | Admitting: Physical Medicine and Rehabilitation

## 2023-08-12 ENCOUNTER — Encounter: Payer: Medicare HMO | Attending: Physical Medicine and Rehabilitation | Admitting: Physical Medicine and Rehabilitation

## 2023-08-12 VITALS — BP 114/72 | HR 59 | Ht 63.0 in | Wt 135.0 lb

## 2023-08-12 DIAGNOSIS — M7918 Myalgia, other site: Secondary | ICD-10-CM | POA: Insufficient documentation

## 2023-08-12 MED ORDER — OXYCODONE-ACETAMINOPHEN 10-325 MG PO TABS: 1.0000 | ORAL_TABLET | Freq: Four times a day (QID) | ORAL | 0 refills | Status: DC | PRN

## 2023-08-12 MED ORDER — TIZANIDINE HCL 2 MG PO CAPS: 2.0000 mg | ORAL_CAPSULE | Freq: Three times a day (TID) | ORAL | 3 refills | Status: DC | PRN

## 2023-08-12 MED ORDER — LIDOCAINE HCL 1 % IJ SOLN
5.0000 mL | Freq: Once | INTRAMUSCULAR | Status: AC
  Administered 2023-08-12: 5 mL via INTRADERMAL

## 2023-08-12 NOTE — Progress Notes (Signed)

## 2023-08-18 DIAGNOSIS — T85890A Other specified complication of nervous system prosthetic devices, implants and grafts, initial encounter: Secondary | ICD-10-CM | POA: Diagnosis not present

## 2023-08-18 DIAGNOSIS — G4733 Obstructive sleep apnea (adult) (pediatric): Secondary | ICD-10-CM | POA: Diagnosis not present

## 2023-08-18 DIAGNOSIS — T85192A Other mechanical complication of implanted electronic neurostimulator (electrode) of spinal cord, initial encounter: Secondary | ICD-10-CM | POA: Diagnosis not present

## 2023-08-20 ENCOUNTER — Encounter: Payer: Self-pay | Admitting: Physician Assistant

## 2023-08-20 ENCOUNTER — Ambulatory Visit (INDEPENDENT_AMBULATORY_CARE_PROVIDER_SITE_OTHER): Payer: Medicare HMO | Admitting: Physician Assistant

## 2023-08-20 DIAGNOSIS — F4310 Post-traumatic stress disorder, unspecified: Secondary | ICD-10-CM | POA: Diagnosis not present

## 2023-08-20 DIAGNOSIS — F339 Major depressive disorder, recurrent, unspecified: Secondary | ICD-10-CM

## 2023-08-20 DIAGNOSIS — G4733 Obstructive sleep apnea (adult) (pediatric): Secondary | ICD-10-CM

## 2023-08-20 DIAGNOSIS — G4700 Insomnia, unspecified: Secondary | ICD-10-CM | POA: Diagnosis not present

## 2023-08-20 MED ORDER — ESCITALOPRAM OXALATE 20 MG PO TABS: 20.0000 mg | ORAL_TABLET | Freq: Every day | ORAL | 1 refills | Status: DC

## 2023-08-20 NOTE — Progress Notes (Signed)
 [facility]  Patient ID: Sharon Logan,  MRN: [id number]  PCP: Stevphen Rochester, MD  Date of Evaluation: 08/20/2023 Time spent:25 minutes  Chief Complaint:  Chief Complaint   Anxiety; Insomnia; Depression; Follow-up    HISTORY/CURRENT STATUS: HPI  For 6 week med check.  Not sleeping as well since being on the CPAP for a few months. Might get about 4 hours max, not sure. Feels 'down,' not sure if it's due to the chronic pain she experiences or if it is that the medication is not working well.  She has battled depression for many years.  Not sleeping well that seems to make it worse.  She has a hard time enjoying things.  Energy and motivation are low.  ADLs and personal hygiene are normal.  No change in memory.  Appetite has not changed.  Weight is stable.  No complaints of anxiety, at least not most of the time.  Denies suicidal or homicidal thoughts.  Patient denies increased energy with decreased need for sleep, increased talkativeness, racing thoughts, impulsivity or risky behaviors, increased spending, increased libido, grandiosity, increased irritability or anger, paranoia, or hallucinations.  Denies dizziness, syncope, seizures, numbness, tingling, tremor, tics, unsteady gait, slurred speech, confusion.  She has chronic back pain for which she sees pain management. No dystonia.   Individual Medical History/ Review of Systems: Changes? :Yes  pain management increased Oxycodone to qid prn.  She had spinal cord stimulator removed a few days ago.   Past Medication Trials: Cymbalta- Started after back surgery. Has made her tired, even when reduced to 30 mg. Was having to take 2 hour naps. May have helped slightly with pain. Had HA, Constipation, Nausea Savella- muscle aches, rhabdomyolysis Effexor-HA, helped with excessive sweating.  Pristiq- May have increased depression. Prozac- Constipation. Causes her to feel cold.  Celexa- Worsening depression at 20 mg  dose Lexapro- "My anti-depressant of choice" and gives her energy. Was taking 2-3 years prior to surgery. Re-started one week ago. Has taken 10 mg and 20 mg doses in the past. Thinks it may have helped with pain in the past. Zoloft- worsening mood Paxil Viibryd- Felt depression was more "heavy." Wellbutrin- Did not cause HA's. Tolerated well and was effective for depression. Auvelity- "felt drugged" and was forgetting appointments.  Amitriptyline- Did not cause HA's. Weight gain. Effective. Caused dry mouth. Nortriptyline- Did not cause HA's. Effective. Lamictal- Rash Gabapentin -Rhabomyolysis Lyrica- Adverse reaction Buspar- Helpful for anxiety in the past. Well-tolerated. Tried again and had severe anxiety.  Trazodone- helped with sleep initiation but sleep was not restful. Severe dry mouth. Ambien- Parasomnia, sleep walking Lunesta- Tolerated, effective at 2 mg dose Remeron- Dry eyes, internal restlessness. Stomach discomfort.  Clonidine Topamax- ineffective for headaches. Caused cognitive side effects.  Hydroxyzine- Initially helped with anxiety and sleep initiation, then no longer as helpful  Allergies: Gabapentin, Amitriptyline, Amlodipine, Oxcarbazepine, Oxycodone-acetaminophen, Polymyxin b, Pregabalin, Zolpidem, Sumatriptan, Cymbalta [duloxetine hcl], Duloxetine, Fluoxetine, Lamotrigine, and Other  Current Medications:  Current Outpatient Medications:    amLODipine (NORVASC) 5 MG tablet, Take 5 mg by mouth daily., Disp: , Rfl:    b complex vitamins capsule, Take 1 capsule by mouth daily., Disp: , Rfl:    BIOTIN PO, Take by mouth., Disp: , Rfl:    Carboxymethylcellulose Sodium (THERATEARS OP), Place 1 drop into both eyes 4 (four) times daily as needed (dry eyes)., Disp: , Rfl:    cetirizine (ZYRTEC) 10 MG tablet, Take 10 mg by mouth daily as needed for allergies.,  Disp: , Rfl:    Cholecalciferol (VITAMIN D3) 25 MCG (1000 UT) CAPS, Take by mouth., Disp: , Rfl:    diclofenac  Sodium (VOLTAREN) 1 % GEL, Apply 2 g topically daily as needed (pain)., Disp: , Rfl:    EPINEPHrine 0.3 mg/0.3 mL IJ SOAJ injection, Inject 0.3 mg into the muscle as needed for anaphylaxis., Disp: , Rfl:    escitalopram (LEXAPRO) 20 MG tablet, Take 1 tablet (20 mg total) by mouth daily., Disp: 30 tablet, Rfl: 1   eszopiclone (LUNESTA) 2 MG TABS tablet, Take 1 tablet (2 mg total) by mouth at bedtime as needed for sleep. Take immediately before bedtime, Disp: 30 tablet, Rfl: 5   fluticasone (FLONASE) 50 MCG/ACT nasal spray, Place 2 sprays into both nostrils 2 (two) times daily as needed for allergies or rhinitis., Disp: , Rfl:    lidocaine (LIDODERM) 5 %, Place 1 patch onto the skin daily., Disp: , Rfl:    magnesium 30 MG tablet, Take 30 mg by mouth at bedtime., Disp: , Rfl:    magnesium gluconate (MAGONATE) 500 MG tablet, Take 500 mg by mouth 2 (two) times daily., Disp: , Rfl:    oxyCODONE-acetaminophen (PERCOCET) 10-325 MG tablet, Take 1 tablet by mouth every 6 (six) hours as needed for pain., Disp: 120 tablet, Rfl: 0   rizatriptan (MAXALT) 10 MG tablet, Take 1 tablet (10 mg total) by mouth daily as needed for migraine., Disp: 90 tablet, Rfl: 3   saccharomyces boulardii (FLORASTOR) 250 MG capsule, Take 250 mg by mouth daily., Disp: , Rfl:    tizanidine (ZANAFLEX) 2 MG capsule, Take 1 capsule (2 mg total) by mouth 3 (three) times daily as needed for muscle spasms., Disp: 270 capsule, Rfl: 3   UNABLE TO FIND, CPAP, Disp: , Rfl:  Medication Side Effects: none  Family Medical/ Social History: Changes? No  MENTAL HEALTH EXAM:  There were no vitals taken for this visit.There is no height or weight on file to calculate BMI.  General Appearance: Casual and Well Groomed  Eye Contact:  Good  Speech:  Clear and Coherent and Normal Rate  Volume:  Normal  Mood:  Depressed  Affect:  Depressed  Thought Process:  Goal Directed and Descriptions of Associations: Circumstantial  Orientation:  Full (Time,  Place, and Person)  Thought Content: Logical   Suicidal Thoughts:  No  Homicidal Thoughts:  No  Memory:  WNL  Judgement:  Good  Insight:  Good  Psychomotor Activity:  Normal  Concentration:  Concentration: Good  Recall:  Good  Fund of Knowledge: Good  Language: Good  Assets:  Desire for Improvement Financial Resources/Insurance Housing Transportation Vocational/Educational  ADL's:  Intact  Cognition: WNL  Prognosis:  Good   DIAGNOSES:    ICD-10-CM   1. Major depression, recurrent, chronic (HCC)  F33.9     2. Obstructive sleep apnea  G47.33     3. PTSD (post-traumatic stress disorder)  F43.10     4. Insomnia, unspecified type  G47.00       Receiving Psychotherapy: No   RECOMMENDATIONS:   PDMP reviewed. Lunesta filled 07/26/2023.  Oxycodone known to me. I provided 25 minutes of face to face time during this encounter, including time spent before and after the visit in records review, medical decision making, counseling pertinent to today's visit, and charting.   We discussed the depression.  I recommend increasing the Lexapro.  She would like to try it.  I do not want to do too many things at  once but if in 2 wks she's not better with sleep, she'll call and I'll increase Lunesta to 3 mg.  Increase Lexapro to 20 mg, 1 p.o. daily. Continue Lunesta 2 mg nightly as needed. Continue vitamins as per med list. Continue CPAP machine. Return in 6-8 weeks.  Melony Overly, PA-C

## 2023-08-23 DIAGNOSIS — Z0000 Encounter for general adult medical examination without abnormal findings: Secondary | ICD-10-CM | POA: Diagnosis not present

## 2023-08-23 DIAGNOSIS — R202 Paresthesia of skin: Secondary | ICD-10-CM | POA: Diagnosis not present

## 2023-08-23 DIAGNOSIS — M0609 Rheumatoid arthritis without rheumatoid factor, multiple sites: Secondary | ICD-10-CM | POA: Diagnosis not present

## 2023-08-23 DIAGNOSIS — M549 Dorsalgia, unspecified: Secondary | ICD-10-CM | POA: Diagnosis not present

## 2023-08-23 DIAGNOSIS — M1990 Unspecified osteoarthritis, unspecified site: Secondary | ICD-10-CM | POA: Diagnosis not present

## 2023-08-23 DIAGNOSIS — M5442 Lumbago with sciatica, left side: Secondary | ICD-10-CM | POA: Diagnosis not present

## 2023-08-23 DIAGNOSIS — M810 Age-related osteoporosis without current pathological fracture: Secondary | ICD-10-CM | POA: Diagnosis not present

## 2023-08-23 DIAGNOSIS — M25569 Pain in unspecified knee: Secondary | ICD-10-CM | POA: Diagnosis not present

## 2023-08-23 DIAGNOSIS — M25529 Pain in unspecified elbow: Secondary | ICD-10-CM | POA: Diagnosis not present

## 2023-08-23 DIAGNOSIS — Z79899 Other long term (current) drug therapy: Secondary | ICD-10-CM | POA: Diagnosis not present

## 2023-08-23 DIAGNOSIS — M79604 Pain in right leg: Secondary | ICD-10-CM | POA: Diagnosis not present

## 2023-08-23 DIAGNOSIS — M79643 Pain in unspecified hand: Secondary | ICD-10-CM | POA: Diagnosis not present

## 2023-08-23 DIAGNOSIS — M79605 Pain in left leg: Secondary | ICD-10-CM | POA: Diagnosis not present

## 2023-08-23 DIAGNOSIS — M79673 Pain in unspecified foot: Secondary | ICD-10-CM | POA: Diagnosis not present

## 2023-08-23 DIAGNOSIS — G629 Polyneuropathy, unspecified: Secondary | ICD-10-CM | POA: Diagnosis not present

## 2023-08-23 DIAGNOSIS — B079 Viral wart, unspecified: Secondary | ICD-10-CM | POA: Diagnosis not present

## 2023-08-27 DIAGNOSIS — M533 Sacrococcygeal disorders, not elsewhere classified: Secondary | ICD-10-CM | POA: Diagnosis not present

## 2023-08-30 DIAGNOSIS — M961 Postlaminectomy syndrome, not elsewhere classified: Secondary | ICD-10-CM | POA: Diagnosis not present

## 2023-08-30 DIAGNOSIS — T85192D Other mechanical complication of implanted electronic neurostimulator (electrode) of spinal cord, subsequent encounter: Secondary | ICD-10-CM | POA: Diagnosis not present

## 2023-08-31 DIAGNOSIS — R52 Pain, unspecified: Secondary | ICD-10-CM | POA: Diagnosis not present

## 2023-09-06 DIAGNOSIS — M542 Cervicalgia: Secondary | ICD-10-CM | POA: Diagnosis not present

## 2023-09-12 ENCOUNTER — Other Ambulatory Visit: Payer: Self-pay | Admitting: Physician Assistant

## 2023-09-13 DIAGNOSIS — M533 Sacrococcygeal disorders, not elsewhere classified: Secondary | ICD-10-CM | POA: Diagnosis not present

## 2023-09-17 DIAGNOSIS — M47812 Spondylosis without myelopathy or radiculopathy, cervical region: Secondary | ICD-10-CM | POA: Diagnosis not present

## 2023-09-22 NOTE — Progress Notes (Unsigned)

## 2023-09-23 ENCOUNTER — Encounter: Payer: Medicare HMO | Attending: Physical Medicine and Rehabilitation | Admitting: Physical Medicine and Rehabilitation

## 2023-09-23 VITALS — BP 117/74 | HR 76 | Ht 63.0 in | Wt 126.0 lb

## 2023-09-23 DIAGNOSIS — G629 Polyneuropathy, unspecified: Secondary | ICD-10-CM

## 2023-09-23 DIAGNOSIS — M7918 Myalgia, other site: Secondary | ICD-10-CM

## 2023-09-23 DIAGNOSIS — M546 Pain in thoracic spine: Secondary | ICD-10-CM | POA: Diagnosis not present

## 2023-09-23 DIAGNOSIS — M5412 Radiculopathy, cervical region: Secondary | ICD-10-CM | POA: Diagnosis not present

## 2023-09-23 DIAGNOSIS — M545 Low back pain, unspecified: Secondary | ICD-10-CM | POA: Diagnosis not present

## 2023-09-23 MED ORDER — OXYCODONE-ACETAMINOPHEN 10-325 MG PO TABS
1.0000 | ORAL_TABLET | Freq: Four times a day (QID) | ORAL | 0 refills | Status: DC | PRN
Start: 2023-09-23 — End: 2023-10-28

## 2023-09-23 MED ORDER — LIDOCAINE HCL 1 % IJ SOLN
5.0000 mL | Freq: Once | INTRAMUSCULAR | Status: AC
  Administered 2023-09-23: 5 mL via INTRADERMAL

## 2023-09-23 NOTE — Progress Notes (Signed)
 Subjective:    Patient ID: Sharon Logan, female    DOB: Sep 29, 1955, 68 y.o.   MRN: [id number]  HPI  [name] is a 68 year old woman who presents for follow-up of cervical myofascial pain syndrome, intolerance to cold.   -Muscles feel like dead weight- both arms and legs.   -going on for 4 years.  -found her husband's body- her husband had throat cancer and was doing radiation and chemo at the same time and suffered a heart attack. This was 14 years ago and may have been a trigger.  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   -she has been given mirtazepine as she also has anxiety   -she has been given hydrocodone and she feels she has built up a tolerance  -she has never tried Topamax.   -she gets cold really fast and hurts twice as bad. She has even been getting cold in the summer and this is intolerable to her  -she loves to read  -she has tried a TENS unit before with benefit while she used it  -continues to have a lot of cervical myofascial pain and is ready to try trigger point injections today. She cannot recall if she has tried these before.   2) Neuropathy: -failed oxycodone -present in calves -started since she has a SCS placed -she has told her SCS company's rep that that she wants the device removed but they have said they need an XR result -she would like to try Qutenza  Pain Inventory Average Pain 5 Pain Right Now 4 My pain is sharp, burning, stabbing, tingling and aching  In the last 24 hours, has pain interfered with the following? General activity 5 Relation with others 5 Enjoyment of life 5 What TIME of day is your pain at its worst? evening Sleep (in general) Poor  Pain is worse with: walking, bending and some activites Pain improves with: rest, heat/ice, therapy/exercise and injections Relief from Meds: 4  walk without assistance how many minutes can you walk? 40 ability to climb steps?  yes do you drive?   yes  disabled: date disabled Jan 2013 retired  numbness depression anxiety  New pt  New pt    Family History  Problem Relation Age of Onset   Depression Mother    Heart attack Mother    COPD Mother    Depression Father    Heart attack Father    ADD / ADHD Son    Social History   Socioeconomic History   Marital status: Widowed    Spouse name: Not on file   Number of children: 2   Years of education: Not on file   Highest education level: Not on file  Occupational History   Not on file  Tobacco Use   Smoking status: Never   Smokeless tobacco: Never  Vaping Use   Vaping status: Never Used  Substance and Sexual Activity   Alcohol use: No   Drug use: No   Sexual activity: Not Currently    Birth control/protection: Surgical    Comment: Hysterectomy  Other Topics Concern   Not on file  Social History Narrative   Right handed   Lives in a one story home   Drinks Tea   Social Drivers of Health   Financial Resource Strain: Low Risk  (08/23/2023)   Received from [facility]   Overall Financial Resource Strain (CARDIA)    Difficulty of Paying Living  Expenses: Not very hard  Food Insecurity: No Food Insecurity (08/23/2023)   Received [facility]   Hunger Vital Sign    Worried About Running Out of Food in the Last Year: Never true    Ran Out of Food in the Last Year: Never true  Transportation Needs: No Transportation Needs (08/23/2023)   Received from [facility] - Transportation    Lack of Transportation (Medical): No    Lack of Transportation (Non-Medical): No  Physical Activity: Sufficiently Active (08/23/2023)   Received from [facility]   Exercise Vital Sign    Days of Exercise per Week: 7 days    Minutes of Exercise per Session: 30 min  Stress: No Stress Concern Present (08/23/2023)   Received from [facility] - Occupational Stress Questionnaire    Feeling of Stress : Not at all   Social Connections: Socially Integrated (08/23/2023)   [facility] from [facility]   Social Network    How would you rate your social network (family, work, friends)?: Good participation with social networks   Past Surgical History:  Procedure Laterality Date   ABDOMINAL HYSTERECTOMY     BACK SURGERY     BUNIONECTOMY Bilateral    EYE SURGERY     laser surgery   NASAL SINUS SURGERY     x2   REVERSE SHOULDER ARTHROPLASTY Right 07/03/2022   Procedure: REVERSE SHOULDER ARTHROPLASTY;  Surgeon: Beverely Low, MD;  Location: [facility];  Service: Orthopedics;  Laterality: Right;  120 min choice with interscalene block   SACROILIAC JOINT FUSION Bilateral 09/12/2020   Procedure: REMOVAL OF BILATERAL PELVIC SCREWS;  Surgeon: Estill Bamberg, MD;  Location: [facility] OR;  Service: Orthopedics;  Laterality: Bilateral;   SKIN CANCER EXCISION     face x3   TUBAL LIGATION     Past Medical History:  Diagnosis Date   Allergic rhinitis    Anxiety    Arthritis    Cancer (HCC)    skin   Chronic pain    Chronically dry eyes    Depression    Fibromyalgia    GERD (gastroesophageal reflux disease)    Headache    Osteoporosis    Sleep apnea    Stroke (HCC)    from gabapenting per pt no deficits   Vitamin D deficiency    Wears glasses    BP 117/74   Pulse 76   Ht 5\' 3"  (1.6 m)   Wt 126 lb (57.2 kg)   SpO2 97%   BMI 22.32 kg/m   Opioid Risk Score:   Fall Risk Score:  `1  Depression screen [facility] 2/9     08/12/2023   11:14 AM 02/18/2023   10:58 AM 10/26/2022   10:59 AM 09/28/2022   10:09 AM 07/27/2022   10:17 AM 05/25/2022   10:54 AM 10/06/2021   10:49 AM  Depression screen PHQ 2/9  Decreased Interest 1 0 0 0 1 0 0  Down, Depressed, Hopeless 1 0 0 0 1 0 0  PHQ - 2 Score 2 0 0 0 2 0 0    Review of Systems  Musculoskeletal:  Positive for myalgias.       Arm, leg, foot pain  Neurological:  Positive for numbness.  Psychiatric/Behavioral:  Positive for dysphoric mood. The patient is  nervous/anxious.   All other systems reviewed and are negative.      Objective:   Physical Exam Gen: no distress, normal appearing HEENT:  oral mucosa pink and moist, NCAT Cardio: Reg rate Chest: normal effort, normal rate of breathing Abd: soft, non-distended Ext: no edema Psych: pleasant, normal affect Skin: intact Neuro: Alert and oriented x3 Musculoskeletal: Diffuse pain and tenderness in upper and lower extremities. +trigger point injections in bilateral trapezius muscles, decreased sensation in feet     Assessment & Plan:  Mrs Bencomo is a 68 year old woman who presents to establish care for fibromyalgia:  1) Chronic Pain Syndrome secondary to fibromyalgia -Discussed current symptoms of pain and history of pain.  -Discussed benefits of exercise in reducing pain. -start topamax 25mg  HS, can uptitrate in 3 days if well tolerated.  -Discussed following foods that may reduce pain: 1) Ginger (especially studied for arthritis)- reduce leukotriene production to decrease inflammation 2) Blueberries- high in phytonutrients that decrease inflammation 3) Salmon- marine omega-3s reduce joint swelling and pain 4) Pumpkin seeds- reduce inflammation 5) dark chocolate- reduces inflammation 6) turmeric- reduces inflammation 7) tart cherries - reduce pain and stiffness 8) extra virgin olive oil - its compound olecanthal helps to block prostaglandins  9) chili peppers- can be eaten or applied topically via capsaicin 10) mint- helpful for headache, muscle aches, joint pain, and itching 11) garlic- reduces inflammation  Link to further information on diet for chronic pain: http://www.bray.com/  2. Insomnia: -Try to go outside near sunrise -Get exercise during the day.  -Discussed good sleep hygiene: turning off all devices an hour before bedtime.  -Chamomile tea with dinner.  -Can consider over the counter  melatonin  2) Anxiety: -Discussed exercise and meditation as tools to decrease anxiety. -Recommended Down Dog Yoga app -Discussed spending time outdoors. -Discussed positive re-framing of anxiety.  -Discussed the following foods that have been show to reduce anxiety: 1) [place] nuts, mushrooms, soy beans due to their high selenium content. Upper limit of toxicity of selenium is 413mcg/day so no more than 3-4 [place] nuts per day.  2) Fatty fish such as salmon, mackerel, sardines, trout, and herring- high in omega-3 fatty acids 3) Eggs- increases serotonin and dopamine 4) Pumpkin seeds- high in omega-3 fatty acids 5) dark chocolate- high in flavanols that increase blood flow to brain 6) turmeric- take with black pepper to increase absorption 7) chamomile tea- antioxidant and anti-inflammatory properties 8) yogurt without sugar- supports gut-brain axis 9) green tea- contains L- theanine 10) blueberries- high in vitamin C and antioxidants 11) [place]- high in tryptophan which gets converted to serotonin 12) bell peppers- rich in vitamin C and antioxidants 13) citrus fruits- rich in vitamin C and antioxidants 14) almonds- high in vitamin E and healthy fats 15) chia seeds- high in omega-3 fatty acids  3) Cervical myofascial pain syndrome Trigger Point Injection  Indication: Cervical myofascial pain not relieved by medication management and other conservative care.  Informed consent was obtained after describing risk and benefits of the procedure with the patient, this includes bleeding, bruising, infection and medication side effects.  The patient wishes to proceed and has given written consent.  The patient was placed in a seated position.  The cervical area was marked and prepped with Betadine.  It was entered with a 25-gauge 1/2 inch needle and a total of 5 mL of 1% lidocaine was injected into a total of 4 trigger points, after negative draw back for blood.  The patient tolerated the  procedure well.  Post procedure instructions were given.    -advised heat to relax muscles -can repeat in 3 months if needed -call me to  repeat earlier if needed -discussed benefits of TENs unit, can try for her if trigger point injections not providing enough relief.  Prescribing Home Zynex NexWave Stimulator Device and supplies as needed. IFC, NMES and TENS medically necessary Treatment Rx: Daily @ 30-40 minutes per treatment PRN. Zynex NexWave only, no substitutions. Treatment Goals: 1) To reduce and/or eliminate pain 2) To improve functional capacity and Activities of daily living 3) To reduce or prevent the need for oral medications 4) To improve circulation in the injured region 5) To decrease or prevent muscle spasm and muscle atrophy 6) To provide a self-management tool to the patient The patient has not sufficiently improved with conservative care. Numerous studies indexed by Medline and PubMed.gov have shown Neuromuscular, Interferential, and TENS stimulators to reduce pain, improve function, and reduce medication use in injured patients. Continued use of this evidence based, safe, drug free treatment is both reasonable and medically necessary at this time.   4) Cold intolerance -very bothersome to her -present even in summer -thyroid low end of norma -prescribed levothyroxine to see if this helps, advised her to take 30-60 minutes before breakfast and to please let me know  5) Low vitamin D -continue current supplementation -advised regarding the benefits of high dose supplementation, can consider if symptoms fail to improve with above regimen. '  6) Neuropathy, bilateral feet -failed oxycodone, buprenorphine, cymbalta, lidocaine patches, aleve, tylenol -discussed that there is no HgbA1c in our system but she [facility]  -Discussed Qutenza as an option for neuropathic pain control. Discussed that this is a capsaicin patch, stronger than capsaicin  cream. Discussed that it is currently approved for diabetic peripheral neuropathy and post-herpetic neuralgia, but that it has also shown benefit in treating other forms of neuropathy. Provided patient with link to site to learn more about the patch: https://www.clark.biz/. Discussed that the patch would be placed in office and benefits usually last 3 months. Discussed that unintended exposure to capsaicin can cause severe irritation of eyes, mucous membranes, respiratory tract, and skin, but that Qutenza is a local treatment and does not have the systemic side effects of other nerve medications. Discussed that there may be pain, itching, erythema, and decreased sensory function associated with the application of Qutenza. Side effects usually subside within 1 week. A cold pack of analgesic medications can help with these side effects. Blood pressure can also be increased due to pain associated with administration of the patch.   -discussed her poor experience with SCS and that it has made her pain worse -discussed that she was started on Xtampza and it does not help her -discussed that she wants to switch to oxycodone TID prn -topamax does not help.  -discussed the differences between Xtampza and oxycodone in terms of release into the bloodstream -discussed that she is not getting relief from Xtampza.   Prescribing Home Zynex NexWave Stimulator Device and supplies as needed. IFC, NMES and TENS medically necessary Treatment Rx: Daily @ 30-40 minutes per treatment PRN. Zynex NexWave only, no substitutions. Treatment Goals: 1) To reduce and/or eliminate pain 2) To improve functional capacity and Activities of daily living 3) To reduce or prevent the need for oral medications 4) To improve circulation in the injured region 5) To decrease or prevent muscle spasm and muscle atrophy 6) To provide a self-management tool to the patient The patient has not sufficiently improved with conservative care. Numerous  studies indexed by Medline and PubMed.gov have shown Neuromuscular,  Interferential, and TENS stimulators to reduce pain, improve function, and reduce medication use in injured patients. Continued use of this evidence based, safe, drug free treatment is both reasonable and medically necessary at this time.   -UDS and pain contract performed today, discussed that if contains the expected metabolites, I will prescribe oxycodone 5mg  TID prn   7) Cervical radiculopathy -failed oxycodone, PT, compounded cream, buprenorphine, advil, aleve, tylenol, cymbalta, lidocaine patches -reviewed her MRI results with her, shows foraminal stenosis and disc bulges  -Discussed Qutenza as an option for neuropathic pain control. Discussed that this is a capsaicin patch, stronger than capsaicin cream. Discussed that it is currently approved for diabetic peripheral neuropathy and post-herpetic neuralgia, but that it has also shown benefit in treating other forms of neuropathy. Provided patient with link to site to learn more about the patch: https://www.clark.biz/. Discussed that the patch would be placed in office and benefits usually last 3 months. Discussed that unintended exposure to capsaicin can cause severe irritation of eyes, mucous membranes, respiratory tract, and skin, but that Qutenza is a local treatment and does not have the systemic side effects of other nerve medications. Discussed that there may be pain, itching, erythema, and decreased sensory function associated with the application of Qutenza. Side effects usually subside within 1 week. A cold pack of analgesic medications can help with these side effects. Blood pressure can also be increased due to pain associated with administration of the patch.   8) Low back and thoracic spine pain: -TLSO ordered  Will apply Qutenza to bilateral feet for neuropathy and also cervical spine and arms for cervical radiculopathy

## 2023-09-24 DIAGNOSIS — M542 Cervicalgia: Secondary | ICD-10-CM | POA: Diagnosis not present

## 2023-10-03 NOTE — Progress Notes (Signed)
 NEUROLOGY FOLLOW UP OFFICE NOTE  [place] [id number]  Assessment/Plan:   Migraine without aura, without status migrainosus.      1.  Defer preventative.  2.  Maxalt  as needed.  Due to age, I would rather she not remain on a triptan.  I will have her try Nurtec samples.   3.  Limit use of pain relievers to no more than 2 days out of week to prevent risk of rebound or medication-overuse headache. 4.  Keep headache diary 5.  Follow up one year  ***   Subjective:  Sharon Logan is a 68 year old right-handed female with fibromyalgia and depression who follows up for migraines and dizziness.   UPDATE:  With Maxalt , headache resolves within 4 hours (within 2-2.5 hours if she can lay down and rest).  They occur 2-3 times a month.   Current NSAIDS/analgesics:  none Current triptans:  Maxalt  10mg  Current Antidepressant medications: Lexapro  Current CGRP inhibitor:  none Other therapy:  none   Caffeine:  2 cups black tea daily.  1 Mt Dew once a week.  No coffee. Diet:  100 oz water  daily.  Does not skip meals Exercise:  yes Depression:  Generally controlled; but a little worse now. Other pain:  Fibromyalgia, back pain Sleep hygiene:  Gets a good 5 hours a night.     HISTORY: She has had migraines "all my life" but worse since menopause.  Weather always is a trigger.  Headaches have been worse.  Typical headaches are across the forehead.  They have changed over the past 3 to 4 months.  They last 2-3 hours with Maxalt  and occur about 15 days a month.  They are now bilateral retro-orbital aching pain.  There is associated with nausea, photophobia, phonophobia and in the past left greater than right arm numbness.  She now has associated dizziness, described as a spinning sensation in which she needs to hold onto something, lasting a couple of minutes.  It was originally 2 times a day and then 1 to 2 times a week.  It is not positional but occurs with activity such as playing with her  dog on the floor or exercising such as yoga.  Dizziness causes problems with balance.  No double vision.  Sometimes dizziness occurs independent from the headaches.  She tried vestibular rehab.   She has fibromyalgia and chronic low back pain and bilateral lumbar radiculopathy with known spondylosis and spinal stenosis of the lumbar spine s/p L4-5 fusion and later L3-S1 fusion revision.  She has previously been followed by surgery, pain management and neurology.  She has failed Cymbalta , gabapentin, Lyrica , Nucenta, baclofen , diclofenac , Celebrex, injections and surgery.       Past NSAIDS/analgesics:  Ibuprofen, naproxen, Excedrin, Fioricet, hydrocodone  Past abortive triptans:  Relpax , sumatriptan  Past abortive ergotamine:  none Past anti-emetic:  none  Past antihypertensive medications:  none Past antidepressant medications:  Nortriptyline , venlafaxine, mirtazapine , Lexapro  Past anticonvulsant medications:  Topiramate , Depakote Past anti-CGRP:  none Other past therapies:  Botox (effective for 2 months and then wears off the last month)     Family history of headache:  Mom  PAST MEDICAL HISTORY: Past Medical History:  Diagnosis Date   Allergic rhinitis    Anxiety    Arthritis    Cancer (HCC)    skin   Chronic pain    Chronically dry eyes    Depression    Fibromyalgia    GERD (gastroesophageal reflux disease)    Headache  Osteoporosis    Sleep apnea    Stroke (HCC)    from gabapenting per pt no deficits   Vitamin D  deficiency    Wears glasses     MEDICATIONS: Current Outpatient Medications on File Prior to Visit  Medication Sig Dispense Refill   amLODipine  (NORVASC ) 5 MG tablet Take 5 mg by mouth daily.     b complex vitamins capsule Take 1 capsule by mouth daily.     BIOTIN PO Take by mouth.     Carboxymethylcellulose Sodium (THERATEARS OP) Place 1 drop into both eyes 4 (four) times daily as needed (dry eyes).     cetirizine (ZYRTEC) 10 MG tablet Take 10 mg by mouth  daily as needed for allergies.     Cholecalciferol  (VITAMIN D3) 25 MCG (1000 UT) CAPS Take by mouth.     diclofenac  Sodium (VOLTAREN ) 1 % GEL Apply 2 g topically daily as needed (pain).     EPINEPHrine  0.3 mg/0.3 mL IJ SOAJ injection Inject 0.3 mg into the muscle as needed for anaphylaxis.     escitalopram  (LEXAPRO ) 20 MG tablet Take 1 tablet (20 mg total) by mouth daily. 30 tablet 1   eszopiclone  (LUNESTA ) 2 MG TABS tablet Take 1 tablet (2 mg total) by mouth at bedtime as needed for sleep. Take immediately before bedtime 30 tablet 5   fluticasone  (FLONASE ) 50 MCG/ACT nasal spray Place 2 sprays into both nostrils 2 (two) times daily as needed for allergies or rhinitis.     lidocaine  (LIDODERM ) 5 % Place 1 patch onto the skin daily.     magnesium  30 MG tablet Take 30 mg by mouth at bedtime.     magnesium  gluconate (MAGONATE) 500 MG tablet Take 500 mg by mouth 2 (two) times daily.     oxyCODONE -acetaminophen  (PERCOCET) 10-325 MG tablet Take 1 tablet by mouth every 6 (six) hours as needed for pain. 120 tablet 0   rizatriptan  (MAXALT ) 10 MG tablet Take 1 tablet (10 mg total) by mouth daily as needed for migraine. 90 tablet 3   saccharomyces boulardii (FLORASTOR) 250 MG capsule Take 250 mg by mouth daily.     tizanidine  (ZANAFLEX ) 2 MG capsule Take 1 capsule (2 mg total) by mouth 3 (three) times daily as needed for muscle spasms. 270 capsule 3   UNABLE TO FIND CPAP     No current facility-administered medications on file prior to visit.    ALLERGIES: Allergies  Allergen Reactions   Gabapentin Other (See Comments)    Delirium, stroke like symptoms  Other Reaction(s): Myalgia, Other  Has stroke symptoms when taking this with percocet.     Other reaction(s): Delirium    Delirium, stroke like symptoms  Other Reaction(s): Unknown   Amitriptyline  Other (See Comments)   Amlodipine  Other (See Comments)    headaches   Oxcarbazepine     Other Reaction(s): Other (See Comments)    Oxycodone -Acetaminophen      Other Reaction(s): Other (See Comments)  Had stroke symptoms mixed with gabapentin.  Had stroke symptoms mixed with gabapentin.     Had stroke symptoms mixed with gabapentin.   Polymyxin B Other (See Comments)    Eyes go blood red  Other Reaction(s): Redness  Other reaction(s): Other (See Comments), Red eye    Eyes go blood red    Other Reaction(s): Unknown  Made eyes red   Pregabalin  Itching and Rash    Itchy red rash on chest  Other Reaction(s): other  Other Reaction(s): Unknown   Zolpidem Rash    "  sleep walk"  Other Reaction(s): Other (See Comments)   Sumatriptan      Other Reaction(s): Fatigue   Cymbalta  [Duloxetine  Hcl] Other (See Comments)    Headaches, constipation   Duloxetine  Other (See Comments)    HEADACHES  CONSTIPATION  Other Reaction(s): Other (See Comments)  Per patient "causes headache and constipation"   Fluoxetine Other (See Comments)    CONSTIPATION  Other Reaction(s): Other (See Comments)  Per patient "constipation"   Lamotrigine Rash   Other Other (See Comments)    OTOBIOTIC > RED EYES    FAMILY HISTORY: Family History  Problem Relation Age of Onset   Depression Mother    Heart attack Mother    COPD Mother    Depression Father    Heart attack Father    ADD / ADHD Son       Objective:  *** General: No acute distress.  Patient appears well-groomed.   Head:  Normocephalic/atraumatic Eyes:  Fundi examined but not visualized Heart:  Regular rate and rhythm Neurological Exam: alert and oriented.  Speech fluent and not dysarthric, language intact.  CN II-XII intact. Bulk and tone normal, muscle strength 5/5 throughout.  Sensation to light touch intact.  Deep tendon reflexes 2+ throughout.  Finger to nose testing intact.  Gait normal, Romberg negative.    Janne Members, DO  CC: Casimer Clear, NP

## 2023-10-04 ENCOUNTER — Telehealth: Payer: Self-pay | Admitting: Pharmacy Technician

## 2023-10-04 ENCOUNTER — Other Ambulatory Visit: Payer: Self-pay | Admitting: Neurology

## 2023-10-04 ENCOUNTER — Ambulatory Visit (INDEPENDENT_AMBULATORY_CARE_PROVIDER_SITE_OTHER): Payer: No Typology Code available for payment source | Admitting: Neurology

## 2023-10-04 ENCOUNTER — Encounter: Payer: Self-pay | Admitting: Neurology

## 2023-10-04 ENCOUNTER — Other Ambulatory Visit (HOSPITAL_COMMUNITY): Payer: Self-pay

## 2023-10-04 ENCOUNTER — Telehealth: Payer: Self-pay

## 2023-10-04 VITALS — BP 98/66 | HR 68 | Ht 63.0 in | Wt 129.2 lb

## 2023-10-04 DIAGNOSIS — G43009 Migraine without aura, not intractable, without status migrainosus: Secondary | ICD-10-CM | POA: Diagnosis not present

## 2023-10-04 MED ORDER — NURTEC 75 MG PO TBDP: 1.0000 | ORAL_TABLET | Freq: Every day | ORAL | 11 refills | Status: DC | PRN

## 2023-10-04 NOTE — Patient Instructions (Signed)
 Will send in prescription for Nurtec.  Otherwise, use Maxalt 

## 2023-10-04 NOTE — Telephone Encounter (Signed)
 Pt starting Nurtec pt needs a PA started

## 2023-10-04 NOTE — Telephone Encounter (Signed)
 Pharmacy Patient Advocate Encounter  Received notification from [employer] that Prior Authorization for NURTEC 75MG  has been DENIED.  Full denial letter will be uploaded to the media tab. See denial reason below.   PA #/Case ID/Reference #:  [id number]

## 2023-10-04 NOTE — Telephone Encounter (Signed)
 PA has been submitted, and telephone encounter has been created. Please see telephone encounter dated 4.21.25.

## 2023-10-04 NOTE — Telephone Encounter (Signed)
 Pharmacy Patient Advocate Encounter   Received notification from Pt Calls Messages that prior authorization for NURTEC 75MG  is required/requested.   Insurance verification completed.   The patient is insured through [employer] .   Per test claim: PA required; PA submitted to above mentioned insurance via CoverMyMeds Key/confirmation #/EOC [facility] Status is pending

## 2023-10-07 ENCOUNTER — Ambulatory Visit: Admitting: Adult Health

## 2023-10-07 DIAGNOSIS — M542 Cervicalgia: Secondary | ICD-10-CM | POA: Diagnosis not present

## 2023-10-07 NOTE — Telephone Encounter (Signed)
 Her insurance states that the Nurtec is not formulary.  However, she can try an alternative medication in the same family called Ubrelvy.  She may come by to pick up samples and let us  know how it works for her.  UBRELVY 100MG  - TAKE 1 TABLET AS NEEDED.  MAY REPEAT AFTER 2 HOURS.  MAXIMUM 2 TABLETS IN 24 HOURS.    LMOVM.  ,Medication Samples have been provided to the patient.  Drug name: Sharon Logan       Strength: 100 mg        Qty: 4  LOT: [id number]  Exp.Date: 12/26  Dosing instructions: as needed  The patient has been instructed regarding the correct time, dose, and frequency of taking this medication, including desired effects and most common side effects.   Sharon Logan 10:30 AM 10/07/2023

## 2023-10-13 ENCOUNTER — Encounter: Payer: Self-pay | Admitting: Physician Assistant

## 2023-10-13 ENCOUNTER — Ambulatory Visit (INDEPENDENT_AMBULATORY_CARE_PROVIDER_SITE_OTHER): Admitting: Physician Assistant

## 2023-10-13 VITALS — BP 138/87 | HR 60

## 2023-10-13 DIAGNOSIS — G4733 Obstructive sleep apnea (adult) (pediatric): Secondary | ICD-10-CM

## 2023-10-13 DIAGNOSIS — F339 Major depressive disorder, recurrent, unspecified: Secondary | ICD-10-CM

## 2023-10-13 DIAGNOSIS — L749 Eccrine sweat disorder, unspecified: Secondary | ICD-10-CM | POA: Diagnosis not present

## 2023-10-13 DIAGNOSIS — R6889 Other general symptoms and signs: Secondary | ICD-10-CM | POA: Diagnosis not present

## 2023-10-13 DIAGNOSIS — Z1330 Encounter for screening examination for mental health and behavioral disorders, unspecified: Secondary | ICD-10-CM | POA: Diagnosis not present

## 2023-10-13 DIAGNOSIS — G4700 Insomnia, unspecified: Secondary | ICD-10-CM

## 2023-10-13 MED ORDER — ESCITALOPRAM OXALATE 10 MG PO TABS: 10.0000 mg | ORAL_TABLET | Freq: Every day | ORAL | 5 refills | Status: DC

## 2023-10-13 MED ORDER — MODAFINIL 100 MG PO TABS
50.0000 mg | ORAL_TABLET | Freq: Every morning | ORAL | 1 refills | Status: DC
Start: 2023-10-13 — End: 2024-02-22

## 2023-10-13 NOTE — Telephone Encounter (Signed)
 No further review needed

## 2023-10-13 NOTE — Progress Notes (Signed)
 [facility] Med Check  Patient ID: Sharon Logan,  MRN: [id number]  PCP: Authur Leghorn, MD  Date of Evaluation: 10/13/2023 Time spent:20 minutes  Chief Complaint:  Chief Complaint   Depression; Insomnia; Follow-up    HISTORY/CURRENT STATUS: HPI  For 6 week med check.  We increased the Lexapro  to 20 mg, 6 weeks ago, it caused somnolence and she lacked energy. She went back to 10 mg. "It's not enough though. I just don't want to do anything. I get out of bed, but I don't want to." Has mild depression but 'not heavy, like it has been in the past.'  There are things she would like to do but just cannot make herself do that.  She does not cry easily.  Just feels sad.  ADLs and personal hygiene are normal.  Appetite is normal and weight is stable.  She is able to focus and get things done in a timely manner.  No change in memory.  She is sleeping better.  Using her CPAP.  No reports of anxiety.  No suicidal or homicidal thoughts.  Patient denies increased energy with decreased need for sleep, increased talkativeness, racing thoughts, impulsivity or risky behaviors, increased spending, increased libido, grandiosity, increased irritability or anger, paranoia, or hallucinations.  Denies dizziness, syncope, seizures, numbness, tingling, tremor, tics, unsteady gait, slurred speech, confusion.  She has chronic back pain.  It is stable.  Individual Medical History/ Review of Systems: Changes? :No     Past Medication Trials: Cymbalta - Started after back surgery. Has made her tired, even when reduced to 30 mg. Was having to take 2 hour naps. May have helped slightly with pain. Had HA, Constipation, Nausea Savella- muscle aches, rhabdomyolysis Effexor-HA, helped with excessive sweating.  Pristiq - May have increased depression. Prozac- Constipation. Causes her to feel cold.  Celexa - Worsening depression at 20 mg dose Lexapro - "My anti-depressant of choice" and gives her energy. Was taking 2-3  years prior to surgery. Re-started one week ago. Has taken 10 mg and 20 mg doses in the past. Thinks it may have helped with pain in the past. Zoloft- worsening mood Paxil Viibryd - Felt depression was more "heavy." Wellbutrin - Did not cause HA's. Tolerated well and was effective for depression. Auvelity - "felt drugged" and was forgetting appointments.  Amitriptyline - Did not cause HA's. Weight gain. Effective. Caused dry mouth. Nortriptyline - Did not cause HA's. Effective. Lamictal- Rash Gabapentin -Rhabomyolysis Lyrica - Adverse reaction Buspar - Helpful for anxiety in the past. Well-tolerated. Tried again and had severe anxiety.  Trazodone - helped with sleep initiation but sleep was not restful. Severe dry mouth. Ambien- Parasomnia, sleep walking Lunesta - Tolerated, effective at 2 mg dose Remeron - Dry eyes, internal restlessness. Stomach discomfort.  Clonidine  Topamax - ineffective for headaches. Caused cognitive side effects.  Hydroxyzine- Initially helped with anxiety and sleep initiation, then no longer as helpful  Allergies: Gabapentin, Amitriptyline , Amlodipine , Oxcarbazepine, Oxycodone -acetaminophen , Polymyxin b, Pregabalin , Zolpidem, Sumatriptan , Cymbalta  [duloxetine  hcl], Duloxetine , Fluoxetine, Lamotrigine, and Other  Current Medications:  Current Outpatient Medications:    amLODipine  (NORVASC ) 5 MG tablet, Take 5 mg by mouth daily., Disp: , Rfl:    b complex vitamins capsule, Take 1 capsule by mouth daily., Disp: , Rfl:    BIOTIN PO, Take by mouth., Disp: , Rfl:    Carboxymethylcellulose Sodium (THERATEARS OP), Place 1 drop into both eyes 4 (four) times daily as needed (dry eyes)., Disp: , Rfl:    cetirizine (ZYRTEC) 10 MG tablet, Take 10 mg by mouth daily as needed for allergies., Disp: ,  Rfl:    Cholecalciferol  (VITAMIN D3) 25 MCG (1000 UT) CAPS, Take by mouth., Disp: , Rfl:    diclofenac  Sodium (VOLTAREN ) 1 % GEL, Apply 2 g topically daily as needed (pain)., Disp: , Rfl:     EPINEPHrine  0.3 mg/0.3 mL IJ SOAJ injection, Inject 0.3 mg into the muscle as needed for anaphylaxis., Disp: , Rfl:    escitalopram  (LEXAPRO ) 10 MG tablet, Take 1 tablet (10 mg total) by mouth daily., Disp: 30 tablet, Rfl: 5   eszopiclone  (LUNESTA ) 2 MG TABS tablet, Take 1 tablet (2 mg total) by mouth at bedtime as needed for sleep. Take immediately before bedtime, Disp: 30 tablet, Rfl: 5   fluticasone  (FLONASE ) 50 MCG/ACT nasal spray, Place 2 sprays into both nostrils 2 (two) times daily as needed for allergies or rhinitis., Disp: , Rfl:    lidocaine  (LIDODERM ) 5 %, Place 1 patch onto the skin daily., Disp: , Rfl:    magnesium  30 MG tablet, Take 30 mg by mouth at bedtime., Disp: , Rfl:    magnesium  gluconate (MAGONATE) 500 MG tablet, Take 500 mg by mouth 2 (two) times daily., Disp: , Rfl:    modafinil (PROVIGIL) 100 MG tablet, Take 0.5-1 tablets (50-100 mg total) by mouth every morning. Prn, Disp: 30 tablet, Rfl: 1   oxyCODONE -acetaminophen  (PERCOCET) 10-325 MG tablet, Take 1 tablet by mouth every 6 (six) hours as needed for pain., Disp: 120 tablet, Rfl: 0   Rimegepant Sulfate (NURTEC) 75 MG TBDP, Take 1 tablet (75 mg total) by mouth daily as needed., Disp: 8 tablet, Rfl: 11   rizatriptan  (MAXALT ) 10 MG tablet, Take 1 tablet (10 mg total) by mouth daily as needed for migraine., Disp: 90 tablet, Rfl: 3   saccharomyces boulardii (FLORASTOR) 250 MG capsule, Take 250 mg by mouth daily., Disp: , Rfl:    tizanidine  (ZANAFLEX ) 2 MG capsule, Take 1 capsule (2 mg total) by mouth 3 (three) times daily as needed for muscle spasms., Disp: 270 capsule, Rfl: 3   UNABLE TO FIND, CPAP, Disp: , Rfl:  Medication Side Effects: none  Family Medical/ Social History: Changes? No  MENTAL HEALTH EXAM:  Blood pressure 138/87, pulse 60.There is no height or weight on file to calculate BMI.  General Appearance: Casual and Well Groomed  Eye Contact:  Good  Speech:  Clear and Coherent and Normal Rate  Volume:  Normal   Mood:  Depressed  Affect:  Depressed  Thought Process:  Goal Directed and Descriptions of Associations: Circumstantial  Orientation:  Full (Time, Place, and Person)  Thought Content: Logical   Suicidal Thoughts:  No  Homicidal Thoughts:  No  Memory:  WNL  Judgement:  Good  Insight:  Good  Psychomotor Activity:  Normal  Concentration:  Concentration: Good  Recall:  Good  Fund of Knowledge: Good  Language: Good  Assets:  Communication Skills Desire for Improvement Financial Resources/Insurance Housing Transportation Vocational/Educational  ADL's:  Intact  Cognition: WNL  Prognosis:  Good   DIAGNOSES:    ICD-10-CM   1. Major depression, recurrent, chronic (HCC)  F33.9     2. Obstructive sleep apnea  G47.33     3. Insomnia, unspecified type  G47.00       Receiving Psychotherapy: No   RECOMMENDATIONS:   PDMP reviewed. Lunesta  filled 07/26/2023.  Oxycodone  known to me. I provided 20 minutes of face to face time during this encounter, including time spent before and after the visit in records review, medical decision making, counseling pertinent to today's  visit, and charting.   We discussed options.  We could increase the Lexapro  to 15 mg and see how she tolerates that.  We could restart a low dose of Wellbutrin .  Other options would be modafinil, armodafinil, and amphetamine or methylphenidate low dose.  A stimulant might be a better choice for her because she has sleep apnea, recently started using a CPAP.  Even with appropriate treatment some patient still lack energy especially in the mornings and a stimulant can be effective for that.  She and I discussed all the options and she prefers to try modafinil.  She knows it will not be covered under her insurance.  She was given GoodRx information and a card and told to pay out of pocket.  She understands.  Benefits, risk and side effects were discussed and she accepts.  Continue Lexapro  10 mg, 1 p.o. daily. Continue Lunesta  2  mg nightly as needed. Start Modafanil 100 mg, 1/2-1 po q am prn. Continue vitamins as per med list. Continue CPAP machine. Return in 6-8 weeks.  Marvia Slocumb, PA-C

## 2023-10-15 DIAGNOSIS — R6889 Other general symptoms and signs: Secondary | ICD-10-CM | POA: Diagnosis not present

## 2023-10-15 DIAGNOSIS — Z130 Encounter for screening for diseases of the blood and blood-forming organs and certain disorders involving the immune mechanism: Secondary | ICD-10-CM | POA: Diagnosis not present

## 2023-10-15 DIAGNOSIS — Z1329 Encounter for screening for other suspected endocrine disorder: Secondary | ICD-10-CM | POA: Diagnosis not present

## 2023-10-15 DIAGNOSIS — L749 Eccrine sweat disorder, unspecified: Secondary | ICD-10-CM | POA: Diagnosis not present

## 2023-10-15 DIAGNOSIS — E870 Hyperosmolality and hypernatremia: Secondary | ICD-10-CM | POA: Diagnosis not present

## 2023-10-15 DIAGNOSIS — R61 Generalized hyperhidrosis: Secondary | ICD-10-CM | POA: Diagnosis not present

## 2023-10-15 DIAGNOSIS — Z13228 Encounter for screening for other metabolic disorders: Secondary | ICD-10-CM | POA: Diagnosis not present

## 2023-10-15 DIAGNOSIS — T887XXA Unspecified adverse effect of drug or medicament, initial encounter: Secondary | ICD-10-CM | POA: Diagnosis not present

## 2023-10-16 DIAGNOSIS — H109 Unspecified conjunctivitis: Secondary | ICD-10-CM | POA: Diagnosis not present

## 2023-10-16 DIAGNOSIS — J019 Acute sinusitis, unspecified: Secondary | ICD-10-CM | POA: Diagnosis not present

## 2023-10-18 DIAGNOSIS — M47812 Spondylosis without myelopathy or radiculopathy, cervical region: Secondary | ICD-10-CM | POA: Diagnosis not present

## 2023-10-18 DIAGNOSIS — M533 Sacrococcygeal disorders, not elsewhere classified: Secondary | ICD-10-CM | POA: Diagnosis not present

## 2023-10-18 DIAGNOSIS — M961 Postlaminectomy syndrome, not elsewhere classified: Secondary | ICD-10-CM | POA: Diagnosis not present

## 2023-10-18 DIAGNOSIS — M5412 Radiculopathy, cervical region: Secondary | ICD-10-CM | POA: Diagnosis not present

## 2023-10-24 DIAGNOSIS — M542 Cervicalgia: Secondary | ICD-10-CM | POA: Diagnosis not present

## 2023-10-26 ENCOUNTER — Other Ambulatory Visit: Payer: Self-pay

## 2023-10-26 ENCOUNTER — Telehealth: Payer: Self-pay | Admitting: Physician Assistant

## 2023-10-26 DIAGNOSIS — G4700 Insomnia, unspecified: Secondary | ICD-10-CM

## 2023-10-26 NOTE — Telephone Encounter (Signed)
 Pended Lunesta  2 mg to [facility] on [address].

## 2023-10-26 NOTE — Telephone Encounter (Signed)
 Pt called asking for a refill on her lunesta  2 mg. Pharmacy is [facility] on [address]. Next appt 12/13/23

## 2023-10-27 DIAGNOSIS — R202 Paresthesia of skin: Secondary | ICD-10-CM | POA: Diagnosis not present

## 2023-10-27 DIAGNOSIS — R200 Anesthesia of skin: Secondary | ICD-10-CM | POA: Diagnosis not present

## 2023-10-27 MED ORDER — ESZOPICLONE 2 MG PO TABS: 2.0000 mg | ORAL_TABLET | Freq: Every evening | ORAL | 1 refills | Status: DC | PRN

## 2023-10-28 ENCOUNTER — Encounter: Payer: Medicare HMO | Attending: Physical Medicine and Rehabilitation | Admitting: Physical Medicine and Rehabilitation

## 2023-10-28 VITALS — BP 114/72 | HR 58 | Ht 63.0 in | Wt 128.0 lb

## 2023-10-28 DIAGNOSIS — M7918 Myalgia, other site: Secondary | ICD-10-CM

## 2023-10-28 DIAGNOSIS — G629 Polyneuropathy, unspecified: Secondary | ICD-10-CM

## 2023-10-28 MED ORDER — LIDOCAINE HCL 1 % IJ SOLN
5.0000 mL | Freq: Once | INTRAMUSCULAR | Status: AC
  Administered 2023-10-28: 5 mL via INTRADERMAL

## 2023-10-28 MED ORDER — SODIUM CHLORIDE (PF) 0.9 % IJ SOLN
3.0000 mL | Freq: Once | INTRAMUSCULAR | Status: AC
  Administered 2023-10-28: 3 mL via INTRAVENOUS

## 2023-10-28 MED ORDER — OXYCODONE HCL 10 MG PO TABS: 10.0000 mg | ORAL_TABLET | Freq: Four times a day (QID) | ORAL | 0 refills | Status: DC | PRN

## 2023-10-28 MED ORDER — METHOCARBAMOL 500 MG PO TABS: 500.0000 mg | ORAL_TABLET | Freq: Four times a day (QID) | ORAL | 3 refills | Status: DC | PRN

## 2023-10-28 NOTE — Progress Notes (Signed)

## 2023-10-28 NOTE — Addendum Note (Signed)
 Addended by: Liam Redhead on: 10/28/2023 10:35 AM   Modules accepted: Orders

## 2023-11-02 DIAGNOSIS — R6889 Other general symptoms and signs: Secondary | ICD-10-CM | POA: Diagnosis not present

## 2023-11-02 DIAGNOSIS — M25561 Pain in right knee: Secondary | ICD-10-CM | POA: Diagnosis not present

## 2023-11-02 DIAGNOSIS — M25551 Pain in right hip: Secondary | ICD-10-CM | POA: Diagnosis not present

## 2023-11-02 DIAGNOSIS — L749 Eccrine sweat disorder, unspecified: Secondary | ICD-10-CM | POA: Diagnosis not present

## 2023-11-02 DIAGNOSIS — M79604 Pain in right leg: Secondary | ICD-10-CM | POA: Diagnosis not present

## 2023-11-02 DIAGNOSIS — R519 Headache, unspecified: Secondary | ICD-10-CM | POA: Diagnosis not present

## 2023-11-02 DIAGNOSIS — G44229 Chronic tension-type headache, not intractable: Secondary | ICD-10-CM | POA: Diagnosis not present

## 2023-11-05 DIAGNOSIS — M25551 Pain in right hip: Secondary | ICD-10-CM | POA: Diagnosis not present

## 2023-11-05 DIAGNOSIS — M79651 Pain in right thigh: Secondary | ICD-10-CM | POA: Diagnosis not present

## 2023-11-05 DIAGNOSIS — M25561 Pain in right knee: Secondary | ICD-10-CM | POA: Diagnosis not present

## 2023-11-10 ENCOUNTER — Encounter: Payer: Self-pay | Admitting: Physical Medicine and Rehabilitation

## 2023-11-11 ENCOUNTER — Other Ambulatory Visit: Payer: Self-pay | Admitting: Physical Medicine and Rehabilitation

## 2023-11-11 ENCOUNTER — Telehealth: Payer: Self-pay | Admitting: Neurology

## 2023-11-11 DIAGNOSIS — G629 Polyneuropathy, unspecified: Secondary | ICD-10-CM

## 2023-11-11 NOTE — Telephone Encounter (Signed)
 Pt would like to talk with someone about her burning sensation in her arms and legs. She has muscle pain, its not new but its has become more intense now. Both arms and legs are cold, no swelling, she is able to walk.

## 2023-11-11 NOTE — Telephone Encounter (Signed)
 Per Dr. Festus Hubert okay to schedule.

## 2023-11-16 ENCOUNTER — Encounter: Attending: Physical Medicine and Rehabilitation | Admitting: Physical Medicine and Rehabilitation

## 2023-11-16 ENCOUNTER — Encounter: Payer: Self-pay | Admitting: Physical Medicine and Rehabilitation

## 2023-11-16 VITALS — BP 130/82 | HR 70 | Ht 63.0 in | Wt 129.4 lb

## 2023-11-16 DIAGNOSIS — G629 Polyneuropathy, unspecified: Secondary | ICD-10-CM | POA: Diagnosis not present

## 2023-11-16 DIAGNOSIS — H2511 Age-related nuclear cataract, right eye: Secondary | ICD-10-CM | POA: Diagnosis not present

## 2023-11-16 DIAGNOSIS — H25043 Posterior subcapsular polar age-related cataract, bilateral: Secondary | ICD-10-CM | POA: Diagnosis not present

## 2023-11-16 DIAGNOSIS — H18413 Arcus senilis, bilateral: Secondary | ICD-10-CM | POA: Diagnosis not present

## 2023-11-16 DIAGNOSIS — H43812 Vitreous degeneration, left eye: Secondary | ICD-10-CM | POA: Diagnosis not present

## 2023-11-16 DIAGNOSIS — H2513 Age-related nuclear cataract, bilateral: Secondary | ICD-10-CM | POA: Diagnosis not present

## 2023-11-16 MED ORDER — CAPSAICIN-CLEANSING GEL 8 % EX KIT
4.0000 | PACK | Freq: Once | CUTANEOUS | Status: AC
  Administered 2023-11-16: 4 via TOPICAL

## 2023-11-16 MED ORDER — OXYCODONE-ACETAMINOPHEN 10-325 MG PO TABS: 1.0000 | ORAL_TABLET | Freq: Four times a day (QID) | ORAL | 0 refills | Status: DC | PRN

## 2023-11-16 MED ORDER — LIDOCAINE HCL 1 % IJ SOLN: 3.0000 mL | Freq: Once | INTRAMUSCULAR | Status: DC

## 2023-11-16 NOTE — Progress Notes (Unsigned)
 NEUROLOGY FOLLOW UP OFFICE NOTE  Sharon Logan [id number]  Assessment/Plan:   Pain/neuralgia - unclear etiology.  No evidence for symptoms based on MRI C-spine and NCV.  Lower extremity symptoms may be lumbar radicular, polyneuropathy or fibromyalgia. Migraine without aura, without status migrainosus, increased frequency.      NCV-EMG lower extremities Migraine prevention:  Plan to start Aimovig  140mg  every 28 days ADDENDUM:  Aimovig  not formulary.  Plan to start Emgality Migraine rescue:  Start Ubrelvy 100mg .   Limit use of pain relievers to no more than 9 days out of the month to prevent risk of rebound or medication-overuse headache. Keep headache diary Follow up 6 months.   Total time spent in chart and face to face with patient:  40 minutes.    Subjective:  Sharon Logan is a 68 year old right-handed female with fibromyalgia and depression whom I see for migraines presents today for pain.  MRI of C-spine from 01/27/2023 personally reviewed.   UPDATE: Pain/Neuralgia: Currently takes methocarbamol  500mg  every 6 hours as needed, oxycodone -acetaminophen   She has fibromyalgia, chronic neck pain with cervical radiculopathy and chronic low back pain and bilateral lumbar radiculopathy with known spondylosis and spinal stenosis of the lumbar spine s/p L4-5 fusion and later L3-S1 fusion revision.  X-ray of lumbar spine on 06/03/2023 showed prior anterior posterior fusion at L3-4, L4-5 and L5-S1 as well as interval hardware removal at L3-S1 and placement of spinal stimulator.  MRI of cervical spine on 01/27/2023 showed mild for age noncompressive disc bulging at C2-3 through C7-T1 without significant spinal stenosis or neural impingement and moderate right C4 and mild right C5 foraminal stenosis due to disc bulge and uncovertebral disease.    In early April, she began experiencing burning pain from the elbows to the hands, sometimes into the middle 3 fingers (particularly the index),  associated with itching and constant aching.  A couple of weeks later, she began noticing similar pain in her legs as well, mainly in the lateral legs but also the feet and sometimes the calves.  In addition, she notes cramping in her thighs and in the right foot.  Legs overall feel weaker.  She walks her dogs twice a day for 30-40 minutes at a time and has difficulty getting through the second walk due to pain, weakness and feeling short of breath.    X-ray of right hip/femur/knee on 11/05/2023 showed stable degenerative changes to both SI joints and chronic changes of the symphysis pubis region but no acute abnormalities.  Repeat MRI of cervical spine on 09/17/2023 showed mild cervical spondylosis and facet joint arthrosis without herniated disc, spinal stenosis or foraminal stenosis.  She had a NCV-EMG of the upper extremities on 10/27/2023 to evaluate bilateral upper extremity pain and paresthesias which showed just very mild median neuropathy at the left wrist but no evidience of cervical radiculopathy or generalized peripheral neuropathy.  She notices that when she takes [place] for her migraines, it helps with the pain as well.  In addition, she began experiencing episodes of diaphoresis with sweating of her chest/torso and legs.  Happens at rest.  May have to change her shirt sometimes 5 times a day.   She has previously been followed by surgery, pain management and neurology.  She has failed Cymbalta , gabapentin, Lyrica , Nucenta, baclofen , diclofenac , Celebrex, injections and surgery.   She is currently followed by pain management as well as physical medicine and rehab who has suggested Qutenza patch.   Migraines: Since onset of  these symptoms, she has had increased frequency of her migraines as well.  Previously occurred 2-3 times a month.  They are now 5 to 7 times in the past 6 weeks.  She tried samples of Ubrelvy and headaches last 2 hours (although Nurtec more effective).          HISTORY: Migraines: She has had migraines "all my life" but worse since menopause.  Weather always is a trigger.  Headaches have been worse.  Typical headaches are across the forehead.  They have changed over the past 3 to 4 months.  They last 2-3 hours with Maxalt  and occur about 15 days a month.  They are now bilateral retro-orbital aching pain.  There is associated with nausea, photophobia, phonophobia and in the past left greater than right arm numbness.  She now has associated dizziness, described as a spinning sensation in which she needs to hold onto something, lasting a couple of minutes.  It was originally 2 times a day and then 1 to 2 times a week.  It is not positional but occurs with activity such as playing with her dog on the floor or exercising such as yoga.  Dizziness causes problems with balance.  No double vision.  Sometimes dizziness occurs independent from the headaches.  She tried vestibular rehab.      Past NSAIDS/analgesics:  Ibuprofen, naproxen, Excedrin, Fioricet, hydrocodone , Celebrex, diclofenac  Past abortive triptans:  Relpax , sumatriptan  Past abortive ergotamine:  none Past anti-emetic:  none  Past muscle relaxant:  baclofen  Past antihypertensive medications:  none Past antidepressant medications:  Nortriptyline , venlafaxine, duloxetine , mirtazapine , Lexapro  Past anticonvulsant medications:  Topiramate , Depakote, gabapentin, Lyrica  Past anti-CGRP:  none Other past therapies:  Botox (effective for 2 months and then wears off the last month)     Family history of headache:  Mom  PAST MEDICAL HISTORY: Past Medical History:  Diagnosis Date   Allergic rhinitis    Anxiety    Arthritis    Cancer (HCC)    skin   Chronic pain    Chronically dry eyes    Depression    Fibromyalgia    GERD (gastroesophageal reflux disease)    Headache    Osteoporosis    Sleep apnea    Stroke (HCC)    from gabapenting per pt no deficits   Vitamin D  deficiency    Wears glasses      MEDICATIONS: Current Outpatient Medications on File Prior to Visit  Medication Sig Dispense Refill   amLODipine  (NORVASC ) 5 MG tablet Take 5 mg by mouth daily.     b complex vitamins capsule Take 1 capsule by mouth daily.     BIOTIN PO Take by mouth.     Carboxymethylcellulose Sodium (THERATEARS OP) Place 1 drop into both eyes 4 (four) times daily as needed (dry eyes).     cetirizine (ZYRTEC) 10 MG tablet Take 10 mg by mouth daily as needed for allergies.     Cholecalciferol  (VITAMIN D3) 25 MCG (1000 UT) CAPS Take by mouth.     diclofenac  Sodium (VOLTAREN ) 1 % GEL Apply 2 g topically daily as needed (pain).     EPINEPHrine  0.3 mg/0.3 mL IJ SOAJ injection Inject 0.3 mg into the muscle as needed for anaphylaxis.     eszopiclone  (LUNESTA ) 2 MG TABS tablet Take 1 tablet (2 mg total) by mouth at bedtime as needed for sleep. Take immediately before bedtime 30 tablet 1   fluticasone  (FLONASE ) 50 MCG/ACT nasal spray Place 2 sprays into both nostrils  2 (two) times daily as needed for allergies or rhinitis.     lidocaine  (LIDODERM ) 5 % Place 1 patch onto the skin daily.     magnesium  30 MG tablet Take 30 mg by mouth at bedtime.     magnesium  gluconate (MAGONATE) 500 MG tablet Take 500 mg by mouth 2 (two) times daily.     methocarbamol  (ROBAXIN ) 500 MG tablet Take 1 tablet (500 mg total) by mouth every 6 (six) hours as needed for muscle spasms. 90 tablet 3   modafinil  (PROVIGIL ) 100 MG tablet Take 0.5-1 tablets (50-100 mg total) by mouth every morning. Prn 30 tablet 1   oxyCODONE -acetaminophen  (PERCOCET) 10-325 MG tablet Take 1 tablet by mouth every 6 (six) hours as needed for pain. 120 tablet 0   rizatriptan  (MAXALT ) 10 MG tablet Take 1 tablet (10 mg total) by mouth daily as needed for migraine. 90 tablet 3   saccharomyces boulardii (FLORASTOR) 250 MG capsule Take 250 mg by mouth daily.     UNABLE TO FIND CPAP     No current facility-administered medications on file prior to visit.     ALLERGIES: Allergies  Allergen Reactions   Gabapentin Other (See Comments)    Delirium, stroke like symptoms  Other Reaction(s): Myalgia, Other  Has stroke symptoms when taking this with percocet.     Other reaction(s): Delirium    Delirium, stroke like symptoms  Other Reaction(s): Unknown   Amitriptyline  Other (See Comments)   Amlodipine  Other (See Comments)    headaches   Oxcarbazepine     Other Reaction(s): Other (See Comments)   Oxycodone -Acetaminophen      Other Reaction(s): Other (See Comments)  Had stroke symptoms mixed with gabapentin.  Had stroke symptoms mixed with gabapentin.     Had stroke symptoms mixed with gabapentin.   Polymyxin B Other (See Comments)    Eyes go blood red  Other Reaction(s): Redness  Other reaction(s): Other (See Comments), Red eye    Eyes go blood red    Other Reaction(s): Unknown  Made eyes red   Pregabalin  Itching and Rash    Itchy red rash on chest  Other Reaction(s): other  Other Reaction(s): Unknown   Zolpidem Rash    "sleep walk"  Other Reaction(s): Other (See Comments)   Sumatriptan      Other Reaction(s): Fatigue   Cymbalta  [Duloxetine  Hcl] Other (See Comments)    Headaches, constipation   Duloxetine  Other (See Comments)    HEADACHES  CONSTIPATION  Other Reaction(s): Other (See Comments)  Per patient "causes headache and constipation"   Fluoxetine Other (See Comments)    CONSTIPATION  Other Reaction(s): Other (See Comments)  Per patient "constipation"   Lamotrigine Rash   Other Other (See Comments)    OTOBIOTIC > RED EYES    FAMILY HISTORY: Family History  Problem Relation Age of Onset   Depression Mother    Heart attack Mother    COPD Mother    Depression Father    Heart attack Father    ADD / ADHD Son       Objective:  Blood pressure (!) 152/76, pulse 86, height 5\' 3"  (1.6 m), weight 128 lb (58.1 kg), SpO2 97%. General: No acute distress.  Patient appears well-groomed.   Head:   Normocephalic/atraumatic Neck:  Supple.   paraspinal tenderness.  Full range of motion. Heart:  Regular rate and rhythm. Back:  bilateral paraspinal tenderness Neuro:  Alert and oriented.  Speech fluent and not dysarthric.  Language intact.  CN II-XII intact.  Bulk and tone normal.  Muscle strength 5/5 throughout.  Sensation to pinprick and vibration intact.  Deep tendon reflexes 2+ throughout, toes downgoing.  Gait normal.  Romberg negative.     Janne Members, DO  CC: Anniece Base, MD

## 2023-11-16 NOTE — Progress Notes (Signed)
-  Discussed Qutenza as an option for neuropathic pain control. Discussed that this is a capsaicin patch, stronger than capsaicin cream. Discussed that it is currently approved for diabetic peripheral neuropathy and post-herpetic neuralgia, but that it has also shown benefit in treating other forms of neuropathy. Provided patient with link to site to learn more about the patch: https://www.clark.biz/. Discussed that the patch would be placed in office and benefits usually last 3 months. Discussed that unintended exposure to capsaicin can cause severe irritation of eyes, mucous membranes, respiratory tract, and skin, but that Qutenza is a local treatment and does not have the systemic side effects of other nerve medications. Discussed that there may be pain, itching, erythema, and decreased sensory function associated with the application of Qutenza. Side effects usually subside within 1 week. A cold pack of analgesic medications can help with these side effects. Blood pressure can also be increased due to pain associated with administration of the patch.   4 patches of Qutenza [phone]) was applied to the bilateral lower legs. Ice packs were applied during the procedure to ensure patient comfort. Blood pressure was monitored every 15 minutes. The patient tolerated the procedure well. Post-procedure instructions were given and follow-up has been scheduled.  Topical system measures 14cm x20cm (280cm for a total 1120units) were applied which will cause deeper penetration for destruction of the peripheral nerve using a chemical (Qutenza) which infuses into the skin like an injection and heat technique (occlusive, compressive dressing cauing endothermic heat technique)

## 2023-11-17 ENCOUNTER — Ambulatory Visit (INDEPENDENT_AMBULATORY_CARE_PROVIDER_SITE_OTHER): Admitting: Physician Assistant

## 2023-11-17 ENCOUNTER — Ambulatory Visit (INDEPENDENT_AMBULATORY_CARE_PROVIDER_SITE_OTHER): Admitting: Neurology

## 2023-11-17 ENCOUNTER — Encounter: Payer: Self-pay | Admitting: Physician Assistant

## 2023-11-17 ENCOUNTER — Encounter: Payer: Self-pay | Admitting: Neurology

## 2023-11-17 ENCOUNTER — Other Ambulatory Visit: Payer: Self-pay | Admitting: Neurology

## 2023-11-17 VITALS — BP 152/76 | HR 86 | Ht 63.0 in | Wt 128.0 lb

## 2023-11-17 DIAGNOSIS — R200 Anesthesia of skin: Secondary | ICD-10-CM

## 2023-11-17 DIAGNOSIS — G43009 Migraine without aura, not intractable, without status migrainosus: Secondary | ICD-10-CM

## 2023-11-17 DIAGNOSIS — G4700 Insomnia, unspecified: Secondary | ICD-10-CM

## 2023-11-17 DIAGNOSIS — F339 Major depressive disorder, recurrent, unspecified: Secondary | ICD-10-CM | POA: Diagnosis not present

## 2023-11-17 MED ORDER — EMGALITY 120 MG/ML ~~LOC~~ SOAJ: 120.0000 mg | SUBCUTANEOUS | 5 refills | Status: DC

## 2023-11-17 MED ORDER — UBRELVY 100 MG PO TABS: 1.0000 | ORAL_TABLET | ORAL | 5 refills | Status: DC | PRN

## 2023-11-17 MED ORDER — AIMOVIG 140 MG/ML ~~LOC~~ SOAJ: 140.0000 mg | SUBCUTANEOUS | 5 refills | Status: DC

## 2023-11-17 NOTE — Patient Instructions (Addendum)
 Plan to start Aimovig  injection every 28 days Order nerve conduction study of both legs.  Further recommendations pending results. Ubrelvy as needed Follow up 6 months.

## 2023-11-17 NOTE — Progress Notes (Signed)
 [facility]  Patient ID: Sharon Logan,  MRN: [id number]  PCP: Authur Leghorn, MD  Date of Evaluation: 11/17/2023 Time spent:20 minutes  Chief Complaint:  Chief Complaint   Depression; Insomnia; Follow-up    HISTORY/CURRENT STATUS: HPI  For 6 week med check.  Not taking Lexapro , her PCP recommended she top it. Is having extreme sweats, has to change her clothes several times a day and her PCP thought it might be the culprit.  Stopping the Lexapro  may have helped slow the drenching sweats down a little bit but it still happens.  Her PCP is referring her to an endocrinologist.  She has an appointment at the end of July.  She is also seeing her neurologist this afternoon, thinking they may be able to shed some light on the problem.  As far as her mood goes, she reports having ups and downs.  But for the most part, the depression is not much of an issue and is tolerable.  She is looking forward to going on vacation in a couple of weeks, to [place]  and [place] .  Energy and motivation are pretty good.  We started modafinil  at the last visit and that has helped some.  She is not taking it every day.  Is having no side effects from it.  Appetite is normal and weight is stable.  ADLs and personal hygiene are normal.  No easy crying.  She sleeps okay some nights and then other nights not so much.  That has not changed.  The Lunesta  does help.  No complaints of extreme anxiety.  Denies suicidal or homicidal thoughts.  Patient denies increased energy with decreased need for sleep, increased talkativeness, racing thoughts, impulsivity or risky behaviors, increased spending, increased libido, grandiosity, increased irritability or anger, paranoia, or hallucinations.  Denies dizziness, syncope, seizures, numbness, tingling, tremor, tics, unsteady gait, slurred speech, confusion.  She has chronic back pain.  It is stable.  Individual Medical History/ Review of Systems: Changes? :No   appt w/  neuro today.  appt w/ endocrinologist end of June.   Past Medication Trials: Cymbalta - Started after back surgery. Has made her tired, even when reduced to 30 mg. Was having to take 2 hour naps. May have helped slightly with pain. Had HA, Constipation, Nausea Savella- muscle aches, rhabdomyolysis Effexor-HA, helped with excessive sweating.  Pristiq - May have increased depression. Prozac- Constipation. Causes her to feel cold.  Celexa - Worsening depression at 20 mg dose Lexapro - "My anti-depressant of choice" and gives her energy. Was taking 2-3 years prior to surgery.  Zoloft- worsening mood Paxil Viibryd - Felt depression was more "heavy." Wellbutrin - Did not cause HA's. Tolerated well and was effective for depression. Auvelity - "felt drugged" and was forgetting appointments.  Amitriptyline - Did not cause HA's. Weight gain. Effective. Caused dry mouth. Nortriptyline - Did not cause HA's. Effective. Lamictal- Rash Gabapentin -Rhabomyolysis Lyrica - Adverse reaction Buspar - Helpful for anxiety in the past. Well-tolerated. Tried again and had severe anxiety.  Trazodone - helped with sleep initiation but sleep was not restful. Severe dry mouth. Ambien- Parasomnia, sleep walking Lunesta - Tolerated, effective at 2 mg dose Remeron - Dry eyes, internal restlessness. Stomach discomfort.  Clonidine  Topamax - ineffective for headaches. Caused cognitive side effects.  Hydroxyzine- Initially helped with anxiety and sleep initiation, then no longer as helpful  Allergies: Gabapentin, Amitriptyline , Amlodipine , Oxcarbazepine, Oxycodone -acetaminophen , Polymyxin b, Pregabalin , Zolpidem, Sumatriptan , Cymbalta  [duloxetine  hcl], Duloxetine , Fluoxetine, Lamotrigine, and Other  Current Medications:  Current Outpatient Medications:    amLODipine  (NORVASC ) 5 MG tablet, Take 5  mg by mouth daily., Disp: , Rfl:    b complex vitamins capsule, Take 1 capsule by mouth daily., Disp: , Rfl:    BIOTIN PO, Take by  mouth., Disp: , Rfl:    Carboxymethylcellulose Sodium (THERATEARS OP), Place 1 drop into both eyes 4 (four) times daily as needed (dry eyes)., Disp: , Rfl:    Cholecalciferol  (VITAMIN D3) 25 MCG (1000 UT) CAPS, Take by mouth., Disp: , Rfl:    diclofenac  Sodium (VOLTAREN ) 1 % GEL, Apply 2 g topically daily as needed (pain)., Disp: , Rfl:    eszopiclone  (LUNESTA ) 2 MG TABS tablet, Take 1 tablet (2 mg total) by mouth at bedtime as needed for sleep. Take immediately before bedtime, Disp: 30 tablet, Rfl: 1   fluticasone  (FLONASE ) 50 MCG/ACT nasal spray, Place 2 sprays into both nostrils 2 (two) times daily as needed for allergies or rhinitis., Disp: , Rfl:    lidocaine  (LIDODERM ) 5 %, Place 1 patch onto the skin daily., Disp: , Rfl:    loratadine  (CLARITIN ) 10 MG tablet, Take 10 mg by mouth daily., Disp: , Rfl:    magnesium  30 MG tablet, Take 30 mg by mouth at bedtime., Disp: , Rfl:    magnesium  gluconate (MAGONATE) 500 MG tablet, Take 500 mg by mouth 2 (two) times daily., Disp: , Rfl:    methocarbamol  (ROBAXIN ) 500 MG tablet, Take 1 tablet (500 mg total) by mouth every 6 (six) hours as needed for muscle spasms., Disp: 90 tablet, Rfl: 3   modafinil  (PROVIGIL ) 100 MG tablet, Take 0.5-1 tablets (50-100 mg total) by mouth every morning. Prn, Disp: 30 tablet, Rfl: 1   oxyCODONE -acetaminophen  (PERCOCET) 10-325 MG tablet, Take 1 tablet by mouth every 6 (six) hours as needed for pain., Disp: 120 tablet, Rfl: 0   rizatriptan  (MAXALT ) 10 MG tablet, Take 1 tablet (10 mg total) by mouth daily as needed for migraine., Disp: 90 tablet, Rfl: 3   saccharomyces boulardii (FLORASTOR) 250 MG capsule, Take 250 mg by mouth daily., Disp: , Rfl:    UNABLE TO FIND, CPAP, Disp: , Rfl:    cetirizine (ZYRTEC) 10 MG tablet, Take 10 mg by mouth daily as needed for allergies. (Patient not taking: Reported on 11/17/2023), Disp: , Rfl:    EPINEPHrine  0.3 mg/0.3 mL IJ SOAJ injection, Inject 0.3 mg into the muscle as needed for  anaphylaxis. (Patient not taking: Reported on 11/17/2023), Disp: , Rfl:  Medication Side Effects: none  Family Medical/ Social History: Changes? No  MENTAL HEALTH EXAM:  There were no vitals taken for this visit.There is no height or weight on file to calculate BMI.  General Appearance: Casual and Well Groomed  Eye Contact:  Good  Speech:  Clear and Coherent and Normal Rate  Volume:  Normal  Mood:  Euthymic  Affect:  Congruent  Thought Process:  Goal Directed and Descriptions of Associations: Circumstantial  Orientation:  Full (Time, Place, and Person)  Thought Content: Logical   Suicidal Thoughts:  No  Homicidal Thoughts:  No  Memory:  WNL  Judgement:  Good  Insight:  Good  Psychomotor Activity:  Normal  Concentration:  Concentration: Good  Recall:  Good  Fund of Knowledge: Good  Language: Good  Assets:  Communication Skills Desire for Improvement Financial Resources/Insurance Housing Transportation Vocational/Educational  ADL's:  Intact  Cognition: WNL  Prognosis:  Good   DIAGNOSES:    ICD-10-CM   1. Major depression, recurrent, chronic (HCC)  F33.9     2. Insomnia, unspecified type  G47.00       Receiving Psychotherapy: No   RECOMMENDATIONS:   PDMP reviewed. Lunesta  filled 10/27/2023.  Modafinil  filled 10/13/2023. I provided 20 minutes of face to face time during this encounter, including time spent before and after the visit in records review, medical decision making, counseling pertinent to today's visit, and charting.   We discussed the increased sweating.  Lexapro  can cause that.  Usually there is improvement pretty quickly though when the drug is stopped.  That has not happened for her.  She will keep the appointment with neurology and endocrinology and hopes to get answers.    For now we will hold any antidepressants because she is stable as far as the depression goes.  If it recurs, I would recommend first of all taking the modafinil  daily.  Stimulants can  be an off-label treatment for depression.  Continue Lunesta  2 mg nightly as needed. Continue Modafanil 100 mg, 1/2-1 po q am prn. Continue vitamins as per med list. Continue CPAP machine. Return in 2 months.  Marvia Slocumb, PA-C

## 2023-11-17 NOTE — Addendum Note (Signed)
 Addended byFestus Hubert, Amil Moseman R on: 11/17/2023 03:30 PM   Modules accepted: Orders

## 2023-11-22 DIAGNOSIS — R0600 Dyspnea, unspecified: Secondary | ICD-10-CM | POA: Diagnosis not present

## 2023-11-22 DIAGNOSIS — S3210XA Unspecified fracture of sacrum, initial encounter for closed fracture: Secondary | ICD-10-CM | POA: Diagnosis not present

## 2023-11-22 DIAGNOSIS — Z472 Encounter for removal of internal fixation device: Secondary | ICD-10-CM | POA: Diagnosis not present

## 2023-11-22 DIAGNOSIS — M5442 Lumbago with sciatica, left side: Secondary | ICD-10-CM | POA: Diagnosis not present

## 2023-11-22 DIAGNOSIS — M5440 Lumbago with sciatica, unspecified side: Secondary | ICD-10-CM | POA: Diagnosis not present

## 2023-11-22 DIAGNOSIS — G8929 Other chronic pain: Secondary | ICD-10-CM | POA: Diagnosis not present

## 2023-11-22 DIAGNOSIS — M545 Low back pain, unspecified: Secondary | ICD-10-CM | POA: Diagnosis not present

## 2023-11-22 DIAGNOSIS — M5441 Lumbago with sciatica, right side: Secondary | ICD-10-CM | POA: Diagnosis not present

## 2023-11-22 DIAGNOSIS — R0602 Shortness of breath: Secondary | ICD-10-CM | POA: Diagnosis not present

## 2023-11-22 DIAGNOSIS — M5136 Other intervertebral disc degeneration, lumbar region with discogenic back pain only: Secondary | ICD-10-CM | POA: Diagnosis not present

## 2023-11-22 DIAGNOSIS — R7989 Other specified abnormal findings of blood chemistry: Secondary | ICD-10-CM | POA: Diagnosis not present

## 2023-11-22 DIAGNOSIS — R079 Chest pain, unspecified: Secondary | ICD-10-CM | POA: Diagnosis not present

## 2023-11-22 DIAGNOSIS — R0789 Other chest pain: Secondary | ICD-10-CM | POA: Diagnosis not present

## 2023-11-24 DIAGNOSIS — R1319 Other dysphagia: Secondary | ICD-10-CM | POA: Diagnosis not present

## 2023-11-24 DIAGNOSIS — M5441 Lumbago with sciatica, right side: Secondary | ICD-10-CM | POA: Diagnosis not present

## 2023-11-24 DIAGNOSIS — M5442 Lumbago with sciatica, left side: Secondary | ICD-10-CM | POA: Diagnosis not present

## 2023-11-24 DIAGNOSIS — M5430 Sciatica, unspecified side: Secondary | ICD-10-CM | POA: Diagnosis not present

## 2023-11-24 DIAGNOSIS — G8929 Other chronic pain: Secondary | ICD-10-CM | POA: Diagnosis not present

## 2023-11-24 DIAGNOSIS — R471 Dysarthria and anarthria: Secondary | ICD-10-CM | POA: Diagnosis not present

## 2023-11-25 ENCOUNTER — Encounter: Payer: Self-pay | Admitting: Physical Medicine and Rehabilitation

## 2023-11-25 DIAGNOSIS — R519 Headache, unspecified: Secondary | ICD-10-CM | POA: Diagnosis not present

## 2023-11-26 ENCOUNTER — Ambulatory Visit: Payer: Self-pay | Admitting: Neurology

## 2023-11-26 ENCOUNTER — Ambulatory Visit (INDEPENDENT_AMBULATORY_CARE_PROVIDER_SITE_OTHER): Admitting: Neurology

## 2023-11-26 DIAGNOSIS — R200 Anesthesia of skin: Secondary | ICD-10-CM

## 2023-11-26 NOTE — Procedures (Signed)
 [facility]  [address] [place], [address]  [place], [place] [address] Tel: [phone] Fax: [phone] Test Date:  11/26/2023  Patient: Sharon Logan DOB: 06-22-1955 Physician: Reyna Cava, DO  Sex: Female Height: 5' 3 Ref Phys: Janne Members, DO  ID#: [id number]   Technician:    History:   NCV & EMG Findings: Electrodiagnostic testing of the right lower extremity and additional studies of the left shows: Bilateral sural and superficial peroneal sensory responses are within normal limits. Bilateral peroneal motor responses at the extensor digitorum brevis are absent, and normal at the tibialis anterior, which is age-appropriate.  Bilateral tibial motor responses are within normal limits.   Bilateral tibial H reflex studies are within normal limits. There is no evidence of active or chronic motor axonal changes affecting any of the tested muscles.  Motor unit configuration and recruitment pattern is within normal limits.   Impression: This is a normal study of the lower extremities.  In particular, there is no evidence of a large fiber sensorimotor polyneuropathy or lumbosacral radiculopathy.   ___________________________ Reyna Cava, DO    Nerve Conduction Studies   Stim Site NR Peak (ms) Norm Peak (ms) O-P Amp (V) Norm O-P Amp  Left Sup Peroneal Anti Sensory (Ant Lat Mall)  32 C  12 cm    2.4 <4.6 7.5 >3  Right Sup Peroneal Anti Sensory (Ant Lat Mall)  32 C  12 cm    2.7 <4.6 7.6 >3  Left Sural Anti Sensory (Lat Mall)  32 C  Calf    2.7 <4.6 11.9 >3  Right Sural Anti Sensory (Lat Mall)  32 C  Calf    2.8 <4.6 14.8 >3     Stim Site NR Onset (ms) Norm Onset (ms) O-P Amp (mV) Norm O-P Amp Site1 Site2 Delta-0 (ms) Dist (cm) Vel (m/s) Norm Vel (m/s)  Left Peroneal Motor (Ext Dig Brev)  32 C  Ankle *NR  <6.0  >2.5 B Fib Ankle  0.0  >40  B Fib *NR     Poplt B Fib  0.0  >40  Poplt *NR            Right Peroneal Motor (Ext Dig Brev)  32 C  Ankle *NR  <6.0   >2.5 B Fib Ankle  0.0  >40  B Fib *NR     Poplt B Fib  0.0  >40  Poplt *NR            Left Peroneal TA Motor (Tib Ant)  32 C  Fib Head    3.4 <4.5 3.6 >3 Poplit Fib Head 1.3 8.0 62 >40  Poplit    4.7 <5.7 3.4         Right Peroneal TA Motor (Tib Ant)  32 C  Fib Head    3.4 <4.5 3.4 >3 Poplit Fib Head 1.4 8.0 57 >40  Poplit    4.8 <5.7 3.2         Left Tibial Motor (Abd Hall Brev)  32 C  Ankle    3.9 <6.0 11.5 >4 Knee Ankle 8.0 42.0 53 >40  Knee    11.9  9.1         Right Tibial Motor (Abd Hall Brev)  32 C  Ankle    3.4 <6.0 6.5 >4 Knee Ankle 8.2 42.0 51 >40  Knee    11.6  4.2          Electromyography   Side Muscle Ins.Act Fibs Fasc Recrt  Amp Dur Poly Activation Comment  Right AntTibialis Nml Nml Nml Nml Nml Nml Nml Nml N/A  Right Gastroc Nml Nml Nml Nml Nml Nml Nml Nml N/A  Right Flex Dig Long Nml Nml Nml Nml Nml Nml Nml Nml N/A  Right RectFemoris Nml Nml Nml Nml Nml Nml Nml Nml N/A  Right BicepsFemS Nml Nml Nml Nml Nml Nml Nml Nml N/A  Right GluteusMed Nml Nml Nml Nml Nml Nml Nml Nml N/A  Left AntTibialis Nml Nml Nml Nml Nml Nml Nml Nml N/A  Left Gastroc Nml Nml Nml Nml Nml Nml Nml Nml N/A  Left Flex Dig Long Nml Nml Nml Nml Nml Nml Nml Nml N/A  Left RectFemoris Nml Nml Nml Nml Nml Nml Nml Nml N/A  Left GluteusMed Nml Nml Nml Nml Nml Nml Nml Nml N/A  Left BicepsFemS Nml Nml Nml Nml Nml Nml Nml Nml N/A      Waveforms:

## 2023-11-26 NOTE — Progress Notes (Signed)
 Patient advised.

## 2023-11-29 ENCOUNTER — Encounter: Admitting: Registered Nurse

## 2023-11-29 DIAGNOSIS — J3081 Allergic rhinitis due to animal (cat) (dog) hair and dander: Secondary | ICD-10-CM | POA: Diagnosis not present

## 2023-11-29 DIAGNOSIS — J301 Allergic rhinitis due to pollen: Secondary | ICD-10-CM | POA: Diagnosis not present

## 2023-11-29 DIAGNOSIS — J3089 Other allergic rhinitis: Secondary | ICD-10-CM | POA: Diagnosis not present

## 2023-12-08 ENCOUNTER — Telehealth: Payer: Self-pay

## 2023-12-08 ENCOUNTER — Other Ambulatory Visit (HOSPITAL_COMMUNITY): Payer: Self-pay

## 2023-12-08 NOTE — Telephone Encounter (Signed)
 Pharmacy Patient Advocate Encounter   Received notification from CoverMyMeds that prior authorization for Emgality  120MG /ML auto-injectors (migraine) is required/requested.   Insurance verification completed.   The patient is insured through [employer] .   Per test claim: PA required; PA submitted to above mentioned insurance via CoverMyMeds Key/confirmation #/EOC [id number] Status is pending

## 2023-12-08 NOTE — Telephone Encounter (Signed)
 Pharmacy Patient Advocate Encounter   Received notification from CoverMyMeds that prior authorization for Ubrelvy  100MG  tablets is required/requested.   Insurance verification completed.   The patient is insured through [employer] .   Per test claim: The current 30** day co-pay is, $47.00.  No PA needed at this time. This test claim was processed through [facility]- copay amounts may vary at other pharmacies due to pharmacy/plan contracts, or as the patient moves through the different stages of their insurance plan.     **Quantity of 10 tablets per 30 days

## 2023-12-09 NOTE — Telephone Encounter (Signed)
 Pharmacy Patient Advocate Encounter  Received notification from [employer] that Prior Authorization for Emgality  120MG /ML auto-injectors (migraine) has been APPROVED from 12-08-2023 to 06-14-2024   PA #/Case ID/Reference #: [id number]

## 2023-12-10 DIAGNOSIS — M4722 Other spondylosis with radiculopathy, cervical region: Secondary | ICD-10-CM | POA: Diagnosis not present

## 2023-12-10 DIAGNOSIS — M4726 Other spondylosis with radiculopathy, lumbar region: Secondary | ICD-10-CM | POA: Diagnosis not present

## 2023-12-10 DIAGNOSIS — M4724 Other spondylosis with radiculopathy, thoracic region: Secondary | ICD-10-CM | POA: Diagnosis not present

## 2023-12-13 ENCOUNTER — Ambulatory Visit: Admitting: Physician Assistant

## 2023-12-13 DIAGNOSIS — M5412 Radiculopathy, cervical region: Secondary | ICD-10-CM | POA: Diagnosis not present

## 2023-12-13 DIAGNOSIS — S92901K Unspecified fracture of right foot, subsequent encounter for fracture with nonunion: Secondary | ICD-10-CM | POA: Diagnosis not present

## 2023-12-13 DIAGNOSIS — M47812 Spondylosis without myelopathy or radiculopathy, cervical region: Secondary | ICD-10-CM | POA: Diagnosis not present

## 2023-12-14 ENCOUNTER — Ambulatory Visit: Admitting: Physician Assistant

## 2023-12-14 ENCOUNTER — Encounter: Payer: Self-pay | Admitting: Physician Assistant

## 2023-12-14 DIAGNOSIS — F411 Generalized anxiety disorder: Secondary | ICD-10-CM

## 2023-12-14 DIAGNOSIS — G4700 Insomnia, unspecified: Secondary | ICD-10-CM | POA: Diagnosis not present

## 2023-12-14 DIAGNOSIS — F32A Depression, unspecified: Secondary | ICD-10-CM | POA: Diagnosis not present

## 2023-12-14 MED ORDER — ESZOPICLONE 3 MG PO TABS: 3.0000 mg | ORAL_TABLET | Freq: Every day | ORAL | 1 refills | Status: DC

## 2023-12-14 MED ORDER — ARIPIPRAZOLE 2 MG PO TABS: 2.0000 mg | ORAL_TABLET | Freq: Every day | ORAL | 1 refills | Status: DC

## 2023-12-14 NOTE — Progress Notes (Signed)
 [facility] Check  Patient ID: Sharon Logan,  MRN: [id number]  PCP: Gladystine Erminio LITTIE, MD  Date of Evaluation: 12/14/2023 Time spent:20 minutes  Chief Complaint:  Chief Complaint   Insomnia; Follow-up    HISTORY/CURRENT STATUS: HPI  For 4 week med check.  Would like to go up to 3 mg on Lunesta . She took the 2 mg, 1.5 pills and it helped a lot.   Patient is able to enjoy things.  [place] and [place] the trip.  Energy and motivation are fair to good depending on the day.  No extreme sadness, tearfulness, or feelings of hopelessness.  ADLs and personal hygiene are normal.  Modafinil  helps her focus more and gives her more energy. Appetite has not changed.  Weight is stable.  No mania, psychosis, or delirium.  Denies suicidal or homicidal thoughts.  I need something to help with anxiety.  On her trip out west, she felt anxious, no PA. Generalized, going on for about 8-9 months. Has tried numerous SSRIs, Buspar , and SNRI that caused intolerable SE. Asks about Abilify .  Never taken anything like that and she things it might help w/ anxiety and mild depression.   Denies dizziness, syncope, seizures, numbness, tingling, tremor, tics, unsteady gait, slurred speech, confusion. Has chronic back pain.  Denies dystonia.  Individual Medical History/ Review of Systems: Changes? :No     Past Medication Trials: Cymbalta - Started after back surgery. Has made her tired, even when reduced to 30 mg. Was having to take 2 hour naps. May have helped slightly with pain. Had HA, Constipation, Nausea Savella- muscle aches, rhabdomyolysis Effexor-HA, helped with excessive sweating.  Pristiq - May have increased depression. Prozac- Constipation. Causes her to feel cold.  Celexa - Worsening depression at 20 mg dose Lexapro - My anti-depressant of choice and gives her energy. Was taking 2-3 years prior to surgery.  Zoloft- worsening mood Paxil Viibryd - Felt depression was more  heavy. Wellbutrin - Did not cause HA's. Tolerated well and was effective for depression. Auvelity - felt drugged and was forgetting appointments.  Amitriptyline - Did not cause HA's. Weight gain. Effective. Caused dry mouth. Nortriptyline - Did not cause HA's. Effective. Lamictal- Rash Gabapentin -Rhabomyolysis Lyrica - Adverse reaction Buspar - Helpful for anxiety in the past. Well-tolerated. Tried again and had severe anxiety.  Trazodone - helped with sleep initiation but sleep was not restful. Severe dry mouth. Ambien- Parasomnia, sleep walking Lunesta - Tolerated, effective at 2 mg dose Remeron - Dry eyes, internal restlessness. Stomach discomfort.  Clonidine  Topamax - ineffective for headaches. Caused cognitive side effects.  Hydroxyzine- Initially helped with anxiety and sleep initiation, then no longer as helpful  Allergies: Gabapentin, Amitriptyline , Amlodipine , Oxcarbazepine, Oxycodone -acetaminophen , Polymyxin b, Pregabalin , Zolpidem, Sumatriptan , Cymbalta  [duloxetine  hcl], Duloxetine , Fluoxetine, Lamotrigine, and Other  Current Medications:  Current Outpatient Medications:    amLODipine  (NORVASC ) 5 MG tablet, Take 5 mg by mouth daily., Disp: , Rfl:    ARIPiprazole  (ABILIFY ) 2 MG tablet, Take 1 tablet (2 mg total) by mouth daily with breakfast., Disp: 30 tablet, Rfl: 1   BIOTIN PO, Take by mouth., Disp: , Rfl:    Carboxymethylcellulose Sodium (THERATEARS OP), Place 1 drop into both eyes 4 (four) times daily as needed (dry eyes)., Disp: , Rfl:    Cholecalciferol  (VITAMIN D3) 25 MCG (1000 UT) CAPS, Take by mouth., Disp: , Rfl:    Eszopiclone  3 MG TABS, Take 1 tablet (3 mg total) by mouth at bedtime. Take immediately before bedtime, Disp: 30 tablet, Rfl: 1   fluticasone  (FLONASE ) 50 MCG/ACT  nasal spray, Place 2 sprays into both nostrils 2 (two) times daily as needed for allergies or rhinitis., Disp: , Rfl:    Galcanezumab -gnlm (EMGALITY ) 120 MG/ML SOAJ, Inject 120 mg into the skin every  28 (twenty-eight) days., Disp: 1.12 mL, Rfl: 5   magnesium  30 MG tablet, Take 30 mg by mouth at bedtime., Disp: , Rfl:    magnesium  gluconate (MAGONATE) 500 MG tablet, Take 500 mg by mouth 2 (two) times daily., Disp: , Rfl:    methocarbamol  (ROBAXIN ) 500 MG tablet, Take 1 tablet (500 mg total) by mouth every 6 (six) hours as needed for muscle spasms., Disp: 90 tablet, Rfl: 3   modafinil  (PROVIGIL ) 100 MG tablet, Take 0.5-1 tablets (50-100 mg total) by mouth every morning. Prn, Disp: 30 tablet, Rfl: 1   oxyCODONE -acetaminophen  (PERCOCET) 10-325 MG tablet, Take 1 tablet by mouth every 6 (six) hours as needed for pain., Disp: 120 tablet, Rfl: 0   rizatriptan  (MAXALT ) 10 MG tablet, Take 1 tablet (10 mg total) by mouth daily as needed for migraine., Disp: 90 tablet, Rfl: 3   saccharomyces boulardii (FLORASTOR) 250 MG capsule, Take 250 mg by mouth daily., Disp: , Rfl:    diclofenac  Sodium (VOLTAREN ) 1 % GEL, Apply 2 g topically daily as needed (pain)., Disp: , Rfl:    EPINEPHrine  0.3 mg/0.3 mL IJ SOAJ injection, Inject 0.3 mg into the muscle as needed for anaphylaxis. (Patient not taking: Reported on 11/17/2023), Disp: , Rfl:    lidocaine  (LIDODERM ) 5 %, Place 1 patch onto the skin daily., Disp: , Rfl:    loratadine  (CLARITIN ) 10 MG tablet, Take 10 mg by mouth daily. (Patient not taking: Reported on 12/14/2023), Disp: , Rfl:    Ubrogepant  (UBRELVY ) 100 MG TABS, Take 1 tablet (100 mg total) by mouth as needed. May repeat after 2 hours.  Maximum 2 tablets in 24 hours (Patient not taking: Reported on 12/14/2023), Disp: 10 tablet, Rfl: 5   UNABLE TO FIND, CPAP, Disp: , Rfl:  Medication Side Effects: none  Family Medical/ Social History: Changes? No  MENTAL HEALTH EXAM:  There were no vitals taken for this visit.There is no height or weight on file to calculate BMI.  General Appearance: Casual and Well Groomed  Eye Contact:  Good  Speech:  Clear and Coherent and Normal Rate  Volume:  Normal  Mood:  Euthymic   Affect:  Congruent  Thought Process:  Goal Directed and Descriptions of Associations: Circumstantial  Orientation:  Full (Time, Place, and Person)  Thought Content: Logical   Suicidal Thoughts:  No  Homicidal Thoughts:  No  Memory:  WNL  Judgement:  Good  Insight:  Good  Psychomotor Activity:  Normal  Concentration:  Concentration: Good and Attention Span: Fair  Recall:  Good  Fund of Knowledge: Good  Language: Good  Assets:  Communication Skills Desire for Improvement Financial Resources/Insurance Housing Transportation Vocational/Educational  ADL's:  Intact  Cognition: WNL  Prognosis:  Good   DIAGNOSES:    ICD-10-CM   1. Generalized anxiety disorder  F41.1     2. Insomnia, unspecified type  G47.00     3. Mild depression  F32.A       Receiving Psychotherapy: No   RECOMMENDATIONS:   PDMP reviewed. Lunesta  filled 11/26/2023.  Modafinil  filled 10/13/2023. I provided approximately 20 minutes of face to face time during this encounter, including time spent before and after the visit in records review, medical decision making, counseling pertinent to today's visit, and charting.   Recommend  increasing Lunesta  to help initiate and keep her asleep.  We discussed the Abilify .  Benefits, risk and side effects were discussed.  She will let me know if she has tremor or any other side effects.  She is aware of possible increased anxiety initially, hopefully if that happens it would go away quickly and actually help the anxiety.  Start Abilify  2 mg, 1 p.o. every morning. Increase Lunesta  to 3 mg nightly as needed. Continue Modafanil 100 mg, 1/2-1 po q am prn. Continue vitamins as per med list. Continue CPAP machine. Return in 1 month.  Verneita Cooks, PA-C

## 2023-12-15 ENCOUNTER — Other Ambulatory Visit (HOSPITAL_COMMUNITY): Payer: Self-pay

## 2023-12-15 ENCOUNTER — Encounter: Payer: Self-pay | Admitting: Registered Nurse

## 2023-12-15 ENCOUNTER — Encounter: Attending: Physical Medicine and Rehabilitation | Admitting: Registered Nurse

## 2023-12-15 VITALS — BP 116/76 | HR 75 | Ht 63.0 in | Wt 131.6 lb

## 2023-12-15 DIAGNOSIS — M48061 Spinal stenosis, lumbar region without neurogenic claudication: Secondary | ICD-10-CM | POA: Insufficient documentation

## 2023-12-15 DIAGNOSIS — Z5181 Encounter for therapeutic drug level monitoring: Secondary | ICD-10-CM | POA: Diagnosis not present

## 2023-12-15 DIAGNOSIS — G629 Polyneuropathy, unspecified: Secondary | ICD-10-CM | POA: Diagnosis not present

## 2023-12-15 DIAGNOSIS — G894 Chronic pain syndrome: Secondary | ICD-10-CM | POA: Insufficient documentation

## 2023-12-15 DIAGNOSIS — Z79891 Long term (current) use of opiate analgesic: Secondary | ICD-10-CM | POA: Diagnosis present

## 2023-12-15 DIAGNOSIS — Z981 Arthrodesis status: Secondary | ICD-10-CM | POA: Diagnosis not present

## 2023-12-15 DIAGNOSIS — M797 Fibromyalgia: Secondary | ICD-10-CM | POA: Insufficient documentation

## 2023-12-15 DIAGNOSIS — M19071 Primary osteoarthritis, right ankle and foot: Secondary | ICD-10-CM | POA: Diagnosis not present

## 2023-12-15 DIAGNOSIS — M546 Pain in thoracic spine: Secondary | ICD-10-CM | POA: Insufficient documentation

## 2023-12-15 DIAGNOSIS — Z8781 Personal history of (healed) traumatic fracture: Secondary | ICD-10-CM | POA: Diagnosis not present

## 2023-12-15 MED ORDER — OXYCODONE-ACETAMINOPHEN 10-325 MG PO TABS: 1.0000 | ORAL_TABLET | Freq: Four times a day (QID) | ORAL | 0 refills | Status: DC | PRN

## 2023-12-15 NOTE — Progress Notes (Signed)
 Subjective:    Patient ID: Sharon Logan, female    DOB: 1955/11/01, 68 y.o.   MRN: [id number]  HPI: Sharon Logan is a 68 y.o. female who returns for follow up appointment for chronic pain and medication refill. She states her  pain is located in  mid- . Lower back and bilateral hands and feet with tingling and burning. She  rates her pain 6. Her current exercise regime is walking and performing stretching exercises.  Sharon Logan Morphine  equivalent is 60.00 MME.   Oral Swab was Performed today.   Pain Inventory Average Pain 7 Pain Right Now 6 My pain is sharp and aching  In the last 24 hours, has pain interfered with the following? General activity 7 Relation with others 7 Enjoyment of life 8 What TIME of day is your pain at its worst? evening Sleep (in general) Poor  Pain is worse with: walking, bending, and sitting Pain improves with: injections Relief from Meds: 8  Family History  Problem Relation Age of Onset   Depression Mother    Heart attack Mother    COPD Mother    Depression Father    Heart attack Father    ADD / ADHD Son    Social History   Socioeconomic History   Marital status: Widowed    Spouse name: Not on file   Number of children: 2   Years of education: Not on file   Highest education level: Not on file  Occupational History   Not on file  Tobacco Use   Smoking status: Never   Smokeless tobacco: Never  Vaping Use   Vaping status: Never Used  Substance and Sexual Activity   Alcohol  use: No   Drug use: No   Sexual activity: Not Currently    Birth control/protection: Surgical    Comment: Hysterectomy  Other Topics Concern   Not on file  Social History Narrative   Right handed   Lives in a one story home   Drinks Tea   Social Drivers of Health   Financial Resource Strain: Low Risk  (11/24/2023)   Received from [facility]   Overall Financial Resource Strain (CARDIA)    Difficulty of Paying Living Expenses: Not hard at all  Food  Insecurity: No Food Insecurity (11/24/2023)   [facility] from [facility]   Hunger Vital Sign    Within the past 12 months, you worried that your food would run out before you got the money to buy more.: Never true    Within the past 12 months, the food you bought just didn't last and you didn't have money to get more.: Never true  Transportation Needs: No Transportation Needs (11/24/2023)   Received from [facility] - Transportation    Lack of Transportation (Medical): No    Lack of Transportation (Non-Medical): No  Physical Activity: Sufficiently Active (11/24/2023)   Received from [facility]   Exercise Vital Sign    On average, how many days per week do you engage in moderate to strenuous exercise (like a brisk walk)?: 7 days    On average, how many minutes do you engage in exercise at this level?: 30 min  Stress: No Stress Concern Present (11/24/2023)   Received from [facility] - Occupational Stress Questionnaire    Feeling of Stress : Not at all  Social Connections: Socially Integrated (11/24/2023)   Received from [facility]   Social Network  How would you rate your social network (family, work, friends)?: Good participation with social networks   Past Surgical History:  Procedure Laterality Date   ABDOMINAL HYSTERECTOMY     BACK SURGERY     BUNIONECTOMY Bilateral    EYE SURGERY     laser surgery   NASAL SINUS SURGERY     x2   REVERSE SHOULDER ARTHROPLASTY Right 07/03/2022   Procedure: REVERSE SHOULDER ARTHROPLASTY;  Surgeon: Kay Kemps, MD;  Location: [facility];  Service: Orthopedics;  Laterality: Right;  120 min choice with interscalene block   SACROILIAC JOINT FUSION Bilateral 09/12/2020   Procedure: REMOVAL OF BILATERAL PELVIC SCREWS;  Surgeon: Beuford Anes, MD;  Location: [facility];  Service: Orthopedics;  Laterality: Bilateral;   SKIN CANCER EXCISION     face x3   TUBAL LIGATION     Past Surgical History:   Procedure Laterality Date   ABDOMINAL HYSTERECTOMY     BACK SURGERY     BUNIONECTOMY Bilateral    EYE SURGERY     laser surgery   NASAL SINUS SURGERY     x2   REVERSE SHOULDER ARTHROPLASTY Right 07/03/2022   Procedure: REVERSE SHOULDER ARTHROPLASTY;  Surgeon: Kay Kemps, MD;  Location: [facility];  Service: Orthopedics;  Laterality: Right;  120 min choice with interscalene block   SACROILIAC JOINT FUSION Bilateral 09/12/2020   Procedure: REMOVAL OF BILATERAL PELVIC SCREWS;  Surgeon: Beuford Anes, MD;  Location: [facility] OR;  Service: Orthopedics;  Laterality: Bilateral;   SKIN CANCER EXCISION     face x3   TUBAL LIGATION     Past Medical History:  Diagnosis Date   Allergic rhinitis    Anxiety    Arthritis    Cancer (HCC)    skin   Chronic pain    Chronically dry eyes    Depression    Fibromyalgia    GERD (gastroesophageal reflux disease)    Headache    Osteoporosis    Sleep apnea    Stroke (HCC)    from gabapenting per pt no deficits   Vitamin D  deficiency    Wears glasses    BP 116/76 (BP Location: Left Arm, Patient Position: Sitting)   Pulse 75   Ht 5' 3 (1.6 m)   Wt 131 lb 9.6 oz (59.7 kg)   SpO2 98%   BMI 23.31 kg/m   Opioid Risk Score:   Fall Risk Score:  `1  Depression screen [facility] 2/9     12/15/2023   10:43 AM 11/16/2023   11:00 AM 08/12/2023   11:14 AM 02/18/2023   10:58 AM 10/26/2022   10:59 AM 09/28/2022   10:09 AM 07/27/2022   10:17 AM  Depression screen PHQ 2/9  Decreased Interest 1 1 1  0 0 0 1  Down, Depressed, Hopeless 1 1 1  0 0 0 1  PHQ - 2 Score 2 2 2  0 0 0 2  Altered sleeping 1        Tired, decreased energy 1        Change in appetite 0        Feeling bad or failure about yourself  0        Moving slowly or fidgety/restless 0        Suicidal thoughts 0        PHQ-9 Score 4        Difficult doing work/chores Somewhat difficult            Review of Systems  Musculoskeletal:  Positive for back pain and myalgias.       Mid and lower back pain  radiating down into both legs with sciaitica  All other systems reviewed and are negative.      Objective:   Physical Exam Vitals and nursing note reviewed.  Constitutional:      Appearance: Normal appearance.  Cardiovascular:     Rate and Rhythm: Normal rate and regular rhythm.     Pulses: Normal pulses.     Heart sounds: Normal heart sounds.  Pulmonary:     Effort: Pulmonary effort is normal.     Breath sounds: Normal breath sounds.  Musculoskeletal:     Comments: Normal Muscle Bulk and Muscle Testing Reveals:  Upper Extremities: Full ROM and Muscle Strength 5/5  Thoracic Paraspinal Tenderness: T-4-T-6 Lumbar Paraspinal Tenderness: L-3-L-5 Bilateral Greater Trochanter Tenderness Lower Extremities: Full ROM and Muscle Strength 5/5 Arises from Table eith ease Narrow Based  Gait     Skin:    General: Skin is warm and dry.  Neurological:     Mental Status: She is alert and oriented to person, place, and time.  Psychiatric:        Mood and Affect: Mood normal.        Behavior: Behavior normal.          Assessment & Plan:  Thoracic Spine Pain: Continue HEP as tolerated. Continue to Monitor.  Lumbar Spinal Stenosis without neurogenic claudication: Continue HEP as tolerated. Continue to Monitor.  Fibromyalgia: Continue HEP as Tolerated. Continue to monitor.  Neuropathy: Continue current medication regimen. Continue to monitor.  Chronic Pain Syndrome: Refilled: Oxycodone  10/325 mg one tablet every 6 hours as needed for pain #120 We will continue the opioid monitoring program, this consists of regular clinic visits, examinations, urine drug screen, pill counts as well as use of [place]  Controlled Substance Reporting system. A 12 month History has been reviewed on the [place]  Controlled Substance Reporting System on 12/15/2023.  F/U in 1 month

## 2023-12-16 ENCOUNTER — Telehealth: Payer: Self-pay | Admitting: Physician Assistant

## 2023-12-16 ENCOUNTER — Other Ambulatory Visit: Payer: Self-pay | Admitting: Physician Assistant

## 2023-12-16 DIAGNOSIS — M5412 Radiculopathy, cervical region: Secondary | ICD-10-CM | POA: Diagnosis not present

## 2023-12-16 MED ORDER — DULOXETINE HCL 30 MG PO CPEP: 30.0000 mg | ORAL_CAPSULE | Freq: Every day | ORAL | 0 refills | Status: DC

## 2023-12-16 NOTE — Telephone Encounter (Signed)
 Pt was just started on Abilify  2 mg (this time) 2 days ago. Reporting all day nausea and diarrhea, being so fatigued that she was afraid she would fall asleep driving. She takes with food. She was on it previously for a couple of months in 2022/2023 and note said she has been on it off and on. She would like to go back to Cymbalta . She said it worked well for her for a while, until it didn't.

## 2023-12-16 NOTE — Telephone Encounter (Signed)
 Pt Lvm @ 10:11a stating that she is having lots of side effects on Abilify .  She wants to know is she can switch to Cymbalta .  Next appt 8/1

## 2023-12-16 NOTE — Telephone Encounter (Signed)
Reviewed information with patient.

## 2023-12-16 NOTE — Telephone Encounter (Signed)
 Ok, stop Abilify . Start Cymbalta  30 mg, 1 q am.  I sent in #30 to [facility] [address]

## 2023-12-20 DIAGNOSIS — M5412 Radiculopathy, cervical region: Secondary | ICD-10-CM | POA: Diagnosis not present

## 2023-12-20 DIAGNOSIS — J3081 Allergic rhinitis due to animal (cat) (dog) hair and dander: Secondary | ICD-10-CM | POA: Diagnosis not present

## 2023-12-20 DIAGNOSIS — J3089 Other allergic rhinitis: Secondary | ICD-10-CM | POA: Diagnosis not present

## 2023-12-20 DIAGNOSIS — J301 Allergic rhinitis due to pollen: Secondary | ICD-10-CM | POA: Diagnosis not present

## 2023-12-21 LAB — DRUG TOX MONITOR 1 W/CONF, ORAL FLD
Amphetamines: NEGATIVE ng/mL (ref <10)
Barbiturates: NEGATIVE ng/mL (ref <10)
Benzodiazepines: NEGATIVE ng/mL (ref <0.50)
Buprenorphine: NEGATIVE ng/mL (ref <0.10)
Cocaine: NEGATIVE ng/mL (ref <5.0)
Codeine: NEGATIVE ng/mL (ref <2.5)
Dihydrocodeine: NEGATIVE ng/mL (ref <2.5)
Fentanyl: NEGATIVE ng/mL (ref <0.10)
Heroin Metabolite: NEGATIVE ng/mL (ref <1.0)
Hydrocodone: NEGATIVE ng/mL (ref <2.5)
Hydromorphone: NEGATIVE ng/mL (ref <2.5)
MARIJUANA: NEGATIVE ng/mL (ref <2.5)
MDMA: NEGATIVE ng/mL (ref <10)
Meprobamate: NEGATIVE ng/mL (ref <2.5)
Methadone: NEGATIVE ng/mL (ref <5.0)
Morphine: NEGATIVE ng/mL (ref <2.5)
Nicotine Metabolite: NEGATIVE ng/mL (ref <5.0)
Norhydrocodone: NEGATIVE ng/mL (ref <2.5)
Noroxycodone: 8.2 ng/mL — ABNORMAL HIGH (ref <2.5)
Opiates: POSITIVE ng/mL — AB (ref <2.5)
Oxycodone: 22.4 ng/mL — ABNORMAL HIGH (ref <2.5)
Oxymorphone: NEGATIVE ng/mL (ref <2.5)
Phencyclidine: NEGATIVE ng/mL (ref <10)
Tapentadol: NEGATIVE ng/mL (ref <5.0)
Tramadol: NEGATIVE ng/mL (ref <5.0)
Zolpidem: NEGATIVE ng/mL (ref <5.0)

## 2023-12-21 LAB — DRUG TOX ALC METAB W/CON, ORAL FLD: Alcohol Metabolite: NEGATIVE ng/mL (ref <25)

## 2023-12-23 DIAGNOSIS — M5412 Radiculopathy, cervical region: Secondary | ICD-10-CM | POA: Diagnosis not present

## 2023-12-27 DIAGNOSIS — Z9889 Other specified postprocedural states: Secondary | ICD-10-CM | POA: Diagnosis not present

## 2023-12-27 DIAGNOSIS — H2511 Age-related nuclear cataract, right eye: Secondary | ICD-10-CM | POA: Diagnosis not present

## 2023-12-28 DIAGNOSIS — H2512 Age-related nuclear cataract, left eye: Secondary | ICD-10-CM | POA: Diagnosis not present

## 2023-12-28 DIAGNOSIS — M5412 Radiculopathy, cervical region: Secondary | ICD-10-CM | POA: Diagnosis not present

## 2023-12-30 DIAGNOSIS — M5412 Radiculopathy, cervical region: Secondary | ICD-10-CM | POA: Diagnosis not present

## 2023-12-30 DIAGNOSIS — M960 Pseudarthrosis after fusion or arthrodesis: Secondary | ICD-10-CM | POA: Diagnosis not present

## 2023-12-30 DIAGNOSIS — S92311K Displaced fracture of first metatarsal bone, right foot, subsequent encounter for fracture with nonunion: Secondary | ICD-10-CM | POA: Diagnosis not present

## 2024-01-03 DIAGNOSIS — M5412 Radiculopathy, cervical region: Secondary | ICD-10-CM | POA: Diagnosis not present

## 2024-01-04 DIAGNOSIS — M0609 Rheumatoid arthritis without rheumatoid factor, multiple sites: Secondary | ICD-10-CM | POA: Diagnosis not present

## 2024-01-04 DIAGNOSIS — M549 Dorsalgia, unspecified: Secondary | ICD-10-CM | POA: Diagnosis not present

## 2024-01-04 DIAGNOSIS — M25529 Pain in unspecified elbow: Secondary | ICD-10-CM | POA: Diagnosis not present

## 2024-01-04 DIAGNOSIS — M797 Fibromyalgia: Secondary | ICD-10-CM | POA: Diagnosis not present

## 2024-01-04 DIAGNOSIS — M79643 Pain in unspecified hand: Secondary | ICD-10-CM | POA: Diagnosis not present

## 2024-01-04 DIAGNOSIS — M1990 Unspecified osteoarthritis, unspecified site: Secondary | ICD-10-CM | POA: Diagnosis not present

## 2024-01-04 DIAGNOSIS — M25569 Pain in unspecified knee: Secondary | ICD-10-CM | POA: Diagnosis not present

## 2024-01-04 DIAGNOSIS — Z79899 Other long term (current) drug therapy: Secondary | ICD-10-CM | POA: Diagnosis not present

## 2024-01-04 DIAGNOSIS — M79673 Pain in unspecified foot: Secondary | ICD-10-CM | POA: Diagnosis not present

## 2024-01-04 DIAGNOSIS — H2511 Age-related nuclear cataract, right eye: Secondary | ICD-10-CM | POA: Diagnosis not present

## 2024-01-06 DIAGNOSIS — M5412 Radiculopathy, cervical region: Secondary | ICD-10-CM | POA: Diagnosis not present

## 2024-01-08 ENCOUNTER — Other Ambulatory Visit: Payer: Self-pay | Admitting: Physician Assistant

## 2024-01-10 DIAGNOSIS — R52 Pain, unspecified: Secondary | ICD-10-CM | POA: Diagnosis not present

## 2024-01-10 DIAGNOSIS — S92901K Unspecified fracture of right foot, subsequent encounter for fracture with nonunion: Secondary | ICD-10-CM | POA: Diagnosis not present

## 2024-01-10 DIAGNOSIS — M2021 Hallux rigidus, right foot: Secondary | ICD-10-CM | POA: Diagnosis not present

## 2024-01-12 DIAGNOSIS — R1310 Dysphagia, unspecified: Secondary | ICD-10-CM | POA: Diagnosis not present

## 2024-01-14 ENCOUNTER — Encounter: Payer: Self-pay | Admitting: Physician Assistant

## 2024-01-14 ENCOUNTER — Ambulatory Visit (INDEPENDENT_AMBULATORY_CARE_PROVIDER_SITE_OTHER): Admitting: Physician Assistant

## 2024-01-14 DIAGNOSIS — F339 Major depressive disorder, recurrent, unspecified: Secondary | ICD-10-CM

## 2024-01-14 DIAGNOSIS — F411 Generalized anxiety disorder: Secondary | ICD-10-CM

## 2024-01-14 DIAGNOSIS — G4733 Obstructive sleep apnea (adult) (pediatric): Secondary | ICD-10-CM | POA: Diagnosis not present

## 2024-01-14 DIAGNOSIS — G4700 Insomnia, unspecified: Secondary | ICD-10-CM | POA: Diagnosis not present

## 2024-01-14 MED ORDER — NORTRIPTYLINE HCL 25 MG PO CAPS: ORAL_CAPSULE | ORAL | 1 refills | Status: DC

## 2024-01-14 NOTE — Progress Notes (Signed)
 [facility]  Patient ID: Sharon Logan,  MRN: [id number]  PCP: Gladystine Erminio LITTIE, MD  Date of Evaluation: 01/14/2024 Time spent:20 minutes  Chief Complaint:  Chief Complaint   Depression; Insomnia; Follow-up    HISTORY/CURRENT STATUS: HPI  For 4 week med check.  Cymbalta  doesn't seem to be the right antidepressant.  Has a black cloud over her head everyday.  The Cymbalta  did seem to help the pain a little bit, oxycodone  doesn't help as much as it did.  If it lasts 4 hours, she's lucky. Many of the SSRIs cause her to have a black cloud over her too.  Remembers that nortriptyline  and amitriptyline  helped many years ago.  Has anhedonia.  Appetite normal, weight stable, personal hygiene normal, ADLs normal as much as she is physically able.  No severe anxiety.  Still has trouble sleeping without the Lunesta .  Uses CPAP religiously.  No mania, delirium, or psychosis.  No SI/HI.  Denies dizziness, syncope, seizures, numbness, tingling, tremor, tics, unsteady gait, slurred speech, confusion. Has chronic back pain.  Denies dystonia.  Individual Medical History/ Review of Systems: Changes? :No     Past Medication Trials: Cymbalta - Started after back surgery. Has made her tired, even when reduced to 30 mg. Was having to take 2 hour naps. May have helped slightly with pain. Had HA, Constipation, Nausea Savella- muscle aches, rhabdomyolysis Effexor-HA, helped with excessive sweating.  Pristiq - May have increased depression. Prozac- Constipation. Causes her to feel cold.  Celexa - Worsening depression at 20 mg dose Lexapro - My anti-depressant of choice and gives her energy. Was taking 2-3 years prior to surgery.  Zoloft- worsening mood Paxil Viibryd - Felt depression was more heavy. Wellbutrin - Did not cause HA's. Tolerated well and was effective for depression. Auvelity - felt drugged and was forgetting appointments.  Amitriptyline - Did not cause HA's. Weight gain. Effective.  Caused dry mouth. Nortriptyline - Did not cause HA's. Effective. Lamictal- Rash Gabapentin -Rhabomyolysis Lyrica - Adverse reaction Buspar - Helpful for anxiety in the past. Well-tolerated. Tried again and had severe anxiety.  Trazodone - helped with sleep initiation but sleep was not restful. Severe dry mouth. Ambien- Parasomnia, sleep walking Lunesta - Tolerated, effective at 2 mg dose Remeron - Dry eyes, internal restlessness. Stomach discomfort.  Clonidine  Topamax - ineffective for headaches. Caused cognitive side effects.  Hydroxyzine- Initially helped with anxiety and sleep initiation, then no longer as helpful  Allergies: Gabapentin, Amitriptyline , Amlodipine , Oxcarbazepine, Oxycodone -acetaminophen , Polymyxin b, Pregabalin , Zolpidem, Sumatriptan , Cymbalta  [duloxetine  hcl], Duloxetine , Fluoxetine, Lamotrigine, and Other  Current Medications:  Current Outpatient Medications:    amLODipine  (NORVASC ) 5 MG tablet, Take 5 mg by mouth daily., Disp: , Rfl:    BIOTIN PO, Take by mouth., Disp: , Rfl:    Carboxymethylcellulose Sodium (THERATEARS OP), Place 1 drop into both eyes 4 (four) times daily as needed (dry eyes)., Disp: , Rfl:    Cholecalciferol  (VITAMIN D3) 25 MCG (1000 UT) CAPS, Take by mouth., Disp: , Rfl:    diclofenac  Sodium (VOLTAREN ) 1 % GEL, Apply 2 g topically daily as needed (pain)., Disp: , Rfl:    DULoxetine  (CYMBALTA ) 30 MG capsule, TAKE 1 CAPSULE BY MOUTH EVERY DAY, Disp: 30 capsule, Rfl: 0   EPINEPHrine  0.3 mg/0.3 mL IJ SOAJ injection, Inject 0.3 mg into the muscle as needed for anaphylaxis., Disp: , Rfl:    Eszopiclone  3 MG TABS, Take 1 tablet (3 mg total) by mouth at bedtime. Take immediately before bedtime, Disp: 30 tablet, Rfl: 1   fluticasone  (FLONASE ) 50 MCG/ACT nasal spray, Place 2 sprays  into both nostrils 2 (two) times daily as needed for allergies or rhinitis., Disp: , Rfl:    Galcanezumab -gnlm (EMGALITY ) 120 MG/ML SOAJ, Inject 120 mg into the skin every 28  (twenty-eight) days., Disp: 1.12 mL, Rfl: 5   lidocaine  (LIDODERM ) 5 %, Place 1 patch onto the skin daily., Disp: , Rfl:    magnesium  30 MG tablet, Take 30 mg by mouth at bedtime., Disp: , Rfl:    magnesium  gluconate (MAGONATE) 500 MG tablet, Take 500 mg by mouth 2 (two) times daily., Disp: , Rfl:    methocarbamol  (ROBAXIN ) 500 MG tablet, Take 1 tablet (500 mg total) by mouth every 6 (six) hours as needed for muscle spasms., Disp: 90 tablet, Rfl: 3   modafinil  (PROVIGIL ) 100 MG tablet, Take 0.5-1 tablets (50-100 mg total) by mouth every morning. Prn, Disp: 30 tablet, Rfl: 1   nortriptyline  (PAMELOR ) 25 MG capsule, 1 po q hs for 2 weeks, then may increase to 2 po at bedtime., Disp: 60 capsule, Rfl: 1   oxyCODONE -acetaminophen  (PERCOCET) 10-325 MG tablet, Take 1 tablet by mouth every 6 (six) hours as needed for pain., Disp: 120 tablet, Rfl: 0   rizatriptan  (MAXALT ) 10 MG tablet, Take 1 tablet (10 mg total) by mouth daily as needed for migraine., Disp: 90 tablet, Rfl: 3   saccharomyces boulardii (FLORASTOR) 250 MG capsule, Take 250 mg by mouth daily., Disp: , Rfl:    UNABLE TO FIND, CPAP, Disp: , Rfl:    loratadine  (CLARITIN ) 10 MG tablet, Take 10 mg by mouth daily. (Patient not taking: Reported on 01/14/2024), Disp: , Rfl:    Ubrogepant  (UBRELVY ) 100 MG TABS, Take 1 tablet (100 mg total) by mouth as needed. May repeat after 2 hours.  Maximum 2 tablets in 24 hours (Patient not taking: Reported on 01/14/2024), Disp: 10 tablet, Rfl: 5 Medication Side Effects: none  Family Medical/ Social History: Changes? No  MENTAL HEALTH EXAM:  There were no vitals taken for this visit.There is no height or weight on file to calculate BMI.  General Appearance: Casual and Well Groomed  Eye Contact:  Good  Speech:  Clear and Coherent and Normal Rate  Volume:  Normal  Mood:  sad  Affect:  Congruent  Thought Process:  Goal Directed and Descriptions of Associations: Circumstantial  Orientation:  Full (Time, Place, and  Person)  Thought Content: Logical   Suicidal Thoughts:  No  Homicidal Thoughts:  No  Memory:  WNL  Judgement:  Good  Insight:  Good  Psychomotor Activity:  Walks with a limp  Concentration:  Concentration: Good and Attention Span: Fair  Recall:  Good  Fund of Knowledge: Good  Language: Good  Assets:  Communication Skills Desire for Improvement Financial Resources/Insurance Housing Transportation Vocational/Educational  ADL's:  Intact  Cognition: WNL  Prognosis:  Good   DIAGNOSES:    ICD-10-CM   1. Major depression, recurrent, chronic (HCC)  F33.9     2. Generalized anxiety disorder  F41.1     3. Insomnia, unspecified type  G47.00     4. Obstructive sleep apnea  G47.33       Receiving Psychotherapy: No   RECOMMENDATIONS:   PDMP reviewed. Lunesta  filled 12/14/2023.  Modafinil  filled 10/13/2023. Oxycodone  known to me.    I provided approximately 20 minutes of face to face time during this encounter, including time spent before and after the visit in records review, medical decision making, counseling pertinent to today's visit, and charting.   We discussed different treatment options.  She has tried numerous antidepressants over the years.  We agreed to retry the nortriptyline .  That may help with the chronic pain and migraines as well.  Discussed potential side effects.  She will let me know if she has any problems.  She may not need the Lunesta  on top of the nortriptyline .  She can take them together if needed though.  The first night I would hold off on the Lunesta .  Stop Cymbalta .  Discussed possible withdrawal symptoms.  If they should occur and are intolerable, call.  Being at 30 mg now, I am hoping she does not have any withdrawals though.  Continue Lunesta  to 3 mg nightly as needed. Restart nortriptyline  25 mg, 1 p.o. nightly for 2 weeks and then may increase to 2 p.o. nightly.  If she is feeling better overall she does not have to increase the dose. Continue vitamins  as per med list. Continue CPAP machine. Return in 6-8 weeks.   Verneita Cooks, PA-C

## 2024-01-17 ENCOUNTER — Ambulatory Visit: Admitting: Physician Assistant

## 2024-01-19 ENCOUNTER — Other Ambulatory Visit: Payer: Self-pay | Admitting: Physical Medicine and Rehabilitation

## 2024-01-24 ENCOUNTER — Ambulatory Visit: Admitting: Physician Assistant

## 2024-02-01 ENCOUNTER — Telehealth: Payer: Self-pay | Admitting: Physician Assistant

## 2024-02-01 NOTE — Telephone Encounter (Signed)
 Pt called at 3:45p stating she wanted to let Verneita know she is taking Nortriptyline  50mg  at 7pm, then one in the morning and one in the afternoon and that is helping with her pain.  But because she is not taking it as directed, she will need a refill sooner.  She expect some time next week.  She wanted Verneita to know.  Next appt 9/15

## 2024-02-03 ENCOUNTER — Encounter: Payer: Self-pay | Admitting: Physical Medicine and Rehabilitation

## 2024-02-03 ENCOUNTER — Encounter: Attending: Physical Medicine and Rehabilitation | Admitting: Physical Medicine and Rehabilitation

## 2024-02-03 VITALS — BP 117/76 | HR 79 | Ht 63.0 in | Wt 132.0 lb

## 2024-02-03 DIAGNOSIS — M7918 Myalgia, other site: Secondary | ICD-10-CM | POA: Insufficient documentation

## 2024-02-03 DIAGNOSIS — M546 Pain in thoracic spine: Secondary | ICD-10-CM | POA: Diagnosis present

## 2024-02-03 DIAGNOSIS — M48061 Spinal stenosis, lumbar region without neurogenic claudication: Secondary | ICD-10-CM | POA: Insufficient documentation

## 2024-02-03 DIAGNOSIS — G894 Chronic pain syndrome: Secondary | ICD-10-CM | POA: Diagnosis present

## 2024-02-03 MED ORDER — SODIUM CHLORIDE (PF) 0.9 % IJ SOLN
2.0000 mL | Freq: Once | INTRAMUSCULAR | Status: AC
  Administered 2024-02-03: 2 mL

## 2024-02-03 MED ORDER — OXYCODONE-ACETAMINOPHEN 10-325 MG PO TABS: 1.0000 | ORAL_TABLET | Freq: Four times a day (QID) | ORAL | 0 refills | Status: DC | PRN

## 2024-02-03 MED ORDER — LIDOCAINE HCL 1 % IJ SOLN
5.0000 mL | Freq: Once | INTRAMUSCULAR | Status: AC
  Administered 2024-02-03: 5 mL

## 2024-02-03 MED ORDER — MELOXICAM 7.5 MG PO TABS: 7.5000 mg | ORAL_TABLET | Freq: Every day | ORAL | 3 refills | Status: DC | PRN

## 2024-02-03 NOTE — Patient Instructions (Signed)
 Pure Encapsulations/Vimergy/Yogi: Methylcobalmin  Methylfolate Cat's claw Lemon Balm  Foods to avoid:  Dairy, gluten, eggs  Eat a lot of fruits and vegetables

## 2024-02-03 NOTE — Progress Notes (Signed)

## 2024-02-04 NOTE — Telephone Encounter (Signed)
 Please see message from patient.  On 8/1 you prescribed 25 mg at bedtime x 2 weeks and then if needed 50 mg at bedtime.

## 2024-02-06 ENCOUNTER — Other Ambulatory Visit: Payer: Self-pay | Admitting: Physician Assistant

## 2024-02-07 NOTE — Telephone Encounter (Signed)
 Ok to take as she is.  However, she needs to see her PCP (or cardiologist if she has one) for an EKG. This drug, and any in its class, can affect heart rhythm more so at higher doses. Even taking it 25 mg in the morning, 25 mg at noon, and 50 mg in the evening, isn't a very high dose.  Please pend it for me, 25 mg, #120 with those directions.  Thanks.

## 2024-02-08 NOTE — Telephone Encounter (Signed)
 Rx sent as directed. Pt notified of Rx and recommendations for EKG. She reports she has an appt with PCP in 2 weeks and will request EKG at that time.

## 2024-02-18 ENCOUNTER — Other Ambulatory Visit: Payer: Self-pay | Admitting: Physician Assistant

## 2024-02-18 DIAGNOSIS — G4700 Insomnia, unspecified: Secondary | ICD-10-CM

## 2024-02-20 NOTE — Telephone Encounter (Signed)
LF 8/5.

## 2024-02-22 ENCOUNTER — Encounter (HOSPITAL_COMMUNITY): Payer: Self-pay

## 2024-02-22 ENCOUNTER — Other Ambulatory Visit: Payer: Self-pay

## 2024-02-22 ENCOUNTER — Emergency Department (HOSPITAL_COMMUNITY)
Admission: EM | Admit: 2024-02-22 | Discharge: 2024-02-22 | Disposition: A | Discharge status: Home / Self Care | Attending: Emergency Medicine | Admitting: Emergency Medicine

## 2024-02-22 ENCOUNTER — Ambulatory Visit: Payer: Self-pay

## 2024-02-22 ENCOUNTER — Ambulatory Visit (INDEPENDENT_AMBULATORY_CARE_PROVIDER_SITE_OTHER): Admitting: Physician Assistant

## 2024-02-22 ENCOUNTER — Encounter: Payer: Self-pay | Admitting: Physician Assistant

## 2024-02-22 ENCOUNTER — Emergency Department (HOSPITAL_COMMUNITY)

## 2024-02-22 DIAGNOSIS — G4700 Insomnia, unspecified: Secondary | ICD-10-CM | POA: Diagnosis not present

## 2024-02-22 DIAGNOSIS — G4733 Obstructive sleep apnea (adult) (pediatric): Secondary | ICD-10-CM | POA: Diagnosis not present

## 2024-02-22 DIAGNOSIS — M546 Pain in thoracic spine: Secondary | ICD-10-CM | POA: Diagnosis not present

## 2024-02-22 DIAGNOSIS — G8929 Other chronic pain: Secondary | ICD-10-CM | POA: Diagnosis not present

## 2024-02-22 DIAGNOSIS — F339 Major depressive disorder, recurrent, unspecified: Secondary | ICD-10-CM | POA: Diagnosis not present

## 2024-02-22 DIAGNOSIS — Z85828 Personal history of other malignant neoplasm of skin: Secondary | ICD-10-CM | POA: Diagnosis not present

## 2024-02-22 DIAGNOSIS — F411 Generalized anxiety disorder: Secondary | ICD-10-CM | POA: Diagnosis not present

## 2024-02-22 DIAGNOSIS — M549 Dorsalgia, unspecified: Secondary | ICD-10-CM | POA: Diagnosis present

## 2024-02-22 LAB — URINALYSIS, W/ REFLEX TO CULTURE (INFECTION SUSPECTED)
Bilirubin Urine: NEGATIVE
Glucose, UA: NEGATIVE mg/dL
Ketones, ur: NEGATIVE mg/dL
Leukocytes,Ua: NEGATIVE
Nitrite: NEGATIVE
Protein, ur: NEGATIVE mg/dL
Specific Gravity, Urine: 1.009 (ref 1.005–1.030)
pH: 5 (ref 5.0–8.0)

## 2024-02-22 MED ORDER — NORTRIPTYLINE HCL 75 MG PO CAPS: 75.0000 mg | ORAL_CAPSULE | Freq: Two times a day (BID) | ORAL | 1 refills | Status: DC

## 2024-02-22 MED ORDER — ESZOPICLONE 3 MG PO TABS: 3.0000 mg | ORAL_TABLET | Freq: Every evening | ORAL | 5 refills | Status: AC | PRN

## 2024-02-22 NOTE — ED Provider Triage Note (Signed)
 Emergency Medicine Provider Triage Evaluation Note  LYRIS HITCHMAN , a 68 y.o. female  was evaluated in triage.  Pt complains of chronic back pain, worsening over the last 1+ week.  Patient endorses some tingling in her legs.  When I enter the room, patient is seated in wheelchair, right leg crossed over left and reading a book..  Review of Systems  Positive:  Negative:   Physical Exam  BP 131/82 (BP Location: Left Arm)   Pulse 86   Temp (!) 97.5 F (36.4 C)   Resp 16   Ht 5' 3.5 (1.613 m)   Wt 59.9 kg   SpO2 99%   BMI 23.02 kg/m  Gen:   Awake, no distress   Resp:  Normal effort  MSK:   Moves extremities without difficulty  Other:    Medical Decision Making  Medically screening exam initiated at 8:53 AM.  Appropriate orders placed.  Monike L Arrazola was informed that the remainder of the evaluation will be completed by another provider, this initial triage assessment does not replace that evaluation, and the importance of remaining in the ED until their evaluation is complete.  Patient with no strength deficits in her bilateral upper or lower extremities.  She has paraspinal tenderness in her thoracic and lumbar spine.  Given her age, will obtain CT imaging.  No evidence of cord compression.  No systemic signs consistent with infection.  Patient with a recent visit to [facility] on the fourth for similar symptoms.  Workup unremarkable at that time.  Patient with a history of chronic pain, is on chronic opiates for pain.   Mannie Pac T, DO 02/22/24 [phone]

## 2024-02-22 NOTE — Progress Notes (Signed)
 [facility]  Patient ID: Sharon Logan,  MRN: [id number]  PCP: Gladystine Erminio LITTIE, MD  Date of Evaluation: 02/22/2024 Time spent:25 minutes  Chief Complaint:  Chief Complaint   Depression; Insomnia; Follow-up    HISTORY/CURRENT STATUS: HPI  For 4 week med check.  We increased Nortriptyline  since LOV, to 25 mg in the morning and around lunch and 50 mg at bedtime.  She feels like it is a good fit for her as far as her mood goes.  She has chronic pain that is not well-controlled.  She thinks the nortriptyline  may help if we increase the dose.  She will be seeing a new spine specialist next week.  She has a pain management specialist already.  States she does not have quality of life really, she can even walk her dog because it hurts too bad.  She is hiring someone to do that.  Personal hygiene is normal.  ADLs are limited because of the pain.  Appetite is at her baseline.   If it wasn't for the Lunesta , I wouldn't get any sleep b/c of the pain.  Does not cry easily.  No delirium, psychosis, mania, SI/HI.  Individual Medical History/ Review of Systems: Changes? :No     Past Medication Trials: Cymbalta - Started after back surgery. Has made her tired, even when reduced to 30 mg. Was having to take 2 hour naps. May have helped slightly with pain. Had HA, Constipation, Nausea Savella- muscle aches, rhabdomyolysis Effexor-HA, helped with excessive sweating.  Pristiq - May have increased depression. Prozac- Constipation. Causes her to feel cold.  Celexa - Worsening depression at 20 mg dose Lexapro - My anti-depressant of choice and gives her energy. Was taking 2-3 years prior to surgery.  Zoloft- worsening mood Paxil Viibryd - Felt depression was more heavy. Wellbutrin - Did not cause HA's. Tolerated well and was effective for depression. Auvelity - felt drugged and was forgetting appointments.  Amitriptyline - Did not cause HA's. Weight gain. Effective. Caused dry  mouth. Nortriptyline - Did not cause HA's. Effective. Lamictal- Rash Gabapentin -Rhabomyolysis Lyrica - Adverse reaction Buspar - Helpful for anxiety in the past. Well-tolerated. Tried again and had severe anxiety.  Trazodone - helped with sleep initiation but sleep was not restful. Severe dry mouth. Ambien- Parasomnia, sleep walking Lunesta - Tolerated, effective at 2 mg dose Remeron - Dry eyes, internal restlessness. Stomach discomfort.  Clonidine  Topamax - ineffective for headaches. Caused cognitive side effects.  Hydroxyzine- Initially helped with anxiety and sleep initiation, then no longer as helpful  Allergies: Gabapentin, Amitriptyline , Amlodipine , Oxcarbazepine, Oxycodone -acetaminophen , Polymyxin b, Pregabalin , Zolpidem, Sumatriptan , Cymbalta  [duloxetine  hcl], Duloxetine , Fluoxetine, Lamotrigine, and Other  Current Medications:  Current Outpatient Medications:    amLODipine  (NORVASC ) 5 MG tablet, Take 5 mg by mouth daily., Disp: , Rfl:    BIOTIN PO, Take by mouth., Disp: , Rfl:    Carboxymethylcellulose Sodium (THERATEARS OP), Place 1 drop into both eyes 4 (four) times daily as needed (dry eyes)., Disp: , Rfl:    Cholecalciferol  (VITAMIN D3) 25 MCG (1000 UT) CAPS, Take by mouth., Disp: , Rfl:    diclofenac  Sodium (VOLTAREN ) 1 % GEL, Apply 2 g topically daily as needed (pain)., Disp: , Rfl:    EPINEPHrine  0.3 mg/0.3 mL IJ SOAJ injection, Inject 0.3 mg into the muscle as needed for anaphylaxis., Disp: , Rfl:    fluticasone  (FLONASE ) 50 MCG/ACT nasal spray, Place 2 sprays into both nostrils 2 (two) times daily as needed for allergies or rhinitis., Disp: , Rfl:    Galcanezumab -gnlm (EMGALITY ) 120 MG/ML SOAJ, Inject 120 mg  into the skin every 28 (twenty-eight) days., Disp: 1.12 mL, Rfl: 5   lidocaine  (LIDODERM ) 5 %, Place 1 patch onto the skin daily., Disp: , Rfl:    loratadine  (CLARITIN ) 10 MG tablet, Take 10 mg by mouth daily., Disp: , Rfl:    magnesium  30 MG tablet, Take 30 mg by mouth at  bedtime., Disp: , Rfl:    magnesium  gluconate (MAGONATE) 500 MG tablet, Take 500 mg by mouth 2 (two) times daily., Disp: , Rfl:    methocarbamol  (ROBAXIN ) 500 MG tablet, TAKE 1 TABLET BY MOUTH EVERY 6 HOURS AS NEEDED FOR MUSCLE SPASMS., Disp: 90 tablet, Rfl: 3   nortriptyline  (PAMELOR ) 75 MG capsule, Take 1 capsule (75 mg total) by mouth 2 (two) times daily., Disp: 60 capsule, Rfl: 1   oxyCODONE -acetaminophen  (PERCOCET) 10-325 MG tablet, Take 1 tablet by mouth every 6 (six) hours as needed for pain., Disp: 120 tablet, Rfl: 0   rizatriptan  (MAXALT ) 10 MG tablet, Take 1 tablet (10 mg total) by mouth daily as needed for migraine., Disp: 90 tablet, Rfl: 3   saccharomyces boulardii (FLORASTOR) 250 MG capsule, Take 250 mg by mouth daily., Disp: , Rfl:    Ubrogepant  (UBRELVY ) 100 MG TABS, Take 1 tablet (100 mg total) by mouth as needed. May repeat after 2 hours.  Maximum 2 tablets in 24 hours, Disp: 10 tablet, Rfl: 5   UNABLE TO FIND, CPAP, Disp: , Rfl:    Eszopiclone  3 MG TABS, Take 1 tablet (3 mg total) by mouth at bedtime as needed. Take immediately before bedtime, Disp: 30 tablet, Rfl: 5   meloxicam  (MOBIC ) 7.5 MG tablet, Take 1 tablet (7.5 mg total) by mouth daily as needed for pain. (Patient not taking: Reported on 02/22/2024), Disp: 30 tablet, Rfl: 3 Medication Side Effects: none  Family Medical/ Social History: Changes? No  MENTAL HEALTH EXAM:  There were no vitals taken for this visit.There is no height or weight on file to calculate BMI.  General Appearance: Casual and Well Groomed  Eye Contact:  Good  Speech:  Clear and Coherent and Normal Rate  Volume:  Normal  Mood:  sad  Affect:  Congruent  Thought Process:  Goal Directed and Descriptions of Associations: Circumstantial  Orientation:  Full (Time, Place, and Person)  Thought Content: Logical   Suicidal Thoughts:  No  Homicidal Thoughts:  No  Memory:  WNL  Judgement:  Good  Insight:  Good  Psychomotor Activity:  [place] and walks  with a limp  Concentration:  Concentration: Good and Attention Span: Fair  Recall:  Good  Fund of Knowledge: Good  Language: Good  Assets:  Communication Skills Desire for Improvement Financial Resources/Insurance Housing Transportation Vocational/Educational  ADL's:  Intact  Cognition: WNL  Prognosis:  Good   EKG 02/17/2024 showed normal sinus rhythm possible anterior infarct, age undetermined, QT interval normal.  See results on chart.  DIAGNOSES:    ICD-10-CM   1. Major depression, recurrent, chronic (HCC)  F33.9     2. Insomnia, unspecified type  G47.00 Eszopiclone  3 MG TABS    3. Generalized anxiety disorder  F41.1     4. Obstructive sleep apnea  G47.33       Receiving Psychotherapy: No   RECOMMENDATIONS:   PDMP reviewed. Lunesta  filled 02/21/2024.  Oxycodone  known to me. I provided approximately 25 minutes of face to face time during this encounter, including time spent before and after the visit in records review, medical decision making, counseling pertinent to today's visit,  and charting.   We discussed her mood.  I think the nortriptyline  is helping a little.  I recommend increasing the dose to help with mood but also hopefully help the chronic pain.  She agrees.  Continue Lunesta  to 3 mg nightly as needed. Increase nortriptyline  to 75 mg twice daily.   Continue vitamins as per med list. Continue CPAP machine. Return in 6 weeks.   Verneita Cooks, PA-C

## 2024-02-22 NOTE — Telephone Encounter (Signed)
 This RN spoke with patient regarding getting set up with a PCP. New patient appointment made.        Copied from CRM [id number]. Topic: Clinical - Red Word Triage >> Feb 22, 2024  4:12 PM Avram MATSU wrote: Red Word that prompted transfer to Nurse Triage: chromic pain no pcp   ----------------------------------------------------------------------- From previous Reason for Contact - Scheduling: Patient/patient representative is calling to schedule an appointment. Refer to attachments for appointment information. Reason for Disposition  [1] Other NON-URGENT information for PCP AND [2] does not require PCP response  Answer Assessment - Initial Assessment Questions Patient states she has a hx of chronic pain. Patient evaluated today in ED for symptoms. Patient states she needs to get established with a PCP so that she can be followed due to symptoms. New patient appointment made.    1. REASON FOR CALL or QUESTION: What is your reason for calling today? or How can I best     Patient requesting to be established as a new patient.  2. CALLER: Document the source of call. (e.g., laboratory staff, caregiver or patient).     Patient  Protocols used: PCP Call - No Triage-A-AH

## 2024-02-22 NOTE — ED Triage Notes (Signed)
 Pt c/o back pain x 1 week. Pt states pain worsens w/movement. Pain is from neck to toes per pt. Pt denies injury or fevers. Pt states she has had back pain in past but has not been this bad. Pt states she has numbness and tingling in both arms and legs. Pt denies bowel or bladder incontinence.

## 2024-02-22 NOTE — Discharge Instructions (Addendum)
 Your CT scans that were done today were normal.  Your urine did not show signs of infection.  You may continue taking all medications as prescribed.  Please follow-up with your primary care doctor.

## 2024-02-22 NOTE — ED Provider Notes (Signed)
 [place] EMERGENCY DEPARTMENT AT [facility] Provider Note  CSN: [id number] Arrival date & time: 02/22/24 9187  Chief Complaint(s) Back Pain  HPI Sharon Logan is a 68 y.o. female who is here today with back pain.  Patient has chronic back pain, some worsening over the last 1 week.  Chronically on hydrocodone  for generalized pain.  She denies any fever or chills.  History of prior back surgery.   Past Medical History Past Medical History:  Diagnosis Date   Allergic rhinitis    Anxiety    Arthritis    Cancer (HCC)    skin   Chronic pain    Chronically dry eyes    Depression    Fibromyalgia    GERD (gastroesophageal reflux disease)    Headache    Osteoporosis    Sleep apnea    Stroke (HCC)    from gabapenting per pt no deficits   Vitamin D  deficiency    Wears glasses    Patient Active Problem List   Diagnosis Date Noted   S/P shoulder replacement, right 07/03/2022   Allergic contact dermatitis due to other agents 04/13/2022   S/P lumbar microdiscectomy 04/30/2021   S/P hardware removal 09/12/2020   Radiculopathy 06/29/2019   Cervical radiculopathy 08/15/2018   Chronic sinusitis 07/27/2018   Constipation 07/27/2018   Chronic bilateral low back pain with bilateral sciatica 07/01/2018   Disorder of bone and cartilage 04/04/2018   Esophageal reflux 04/04/2018   Other and unspecified hyperlipidemia 04/04/2018   Other malaise and fatigue 04/04/2018   Pain in joint involving ankle and foot 04/04/2018   Leg pain 04/04/2018   Posttraumatic stress disorder 04/04/2018   Raynaud's phenomenon 04/04/2018   Skin sensation disturbance 04/04/2018   Sleep disturbance 04/04/2018   Vitamin D  deficiency 04/04/2018   Myoclonus, segmental 10/18/2017   Adjacent segment disease with spinal stenosis 10/04/2017   DDD (degenerative disc disease), lumbar 10/30/2016   Spondylolisthesis of lumbar region 08/21/2016   Overweight with body mass index (BMI) 25.0-29.9 07/22/2016    Capsulitis of metatarsophalangeal (MTP) joint of right foot 03/23/2016   Lumbar spinal stenosis 12/05/2015   Migraine 09/13/2012   Other allergic rhinitis 09/13/2012   Neuropathy 09/13/2012   Home Medication(s) Prior to Admission medications   Medication Sig Start Date End Date Taking? Authorizing Provider  amLODipine  (NORVASC ) 5 MG tablet Take 5 mg by mouth daily. 03/11/22   [provider]  BIOTIN PO Take by mouth.    [provider]  Carboxymethylcellulose Sodium (THERATEARS OP) Place 1 drop into both eyes 4 (four) times daily as needed (dry eyes).    [provider]  Cholecalciferol  (VITAMIN D3) 25 MCG (1000 UT) CAPS Take by mouth.    [provider]  diclofenac  Sodium (VOLTAREN ) 1 % GEL Apply 2 g topically daily as needed (pain). 09/22/21   [provider]  DULoxetine  (CYMBALTA ) 30 MG capsule TAKE 1 CAPSULE BY MOUTH EVERY DAY 01/08/24   Rhys Boyer T, PA-C  EPINEPHrine  0.3 mg/0.3 mL IJ SOAJ injection Inject 0.3 mg into the muscle as needed for anaphylaxis. 05/26/21   [provider]  Eszopiclone  3 MG TABS TAKE 1 TABLET BY MOUTH AT BEDTIME TAKE IMMEDIATELY BEFORE BEDTIME 02/21/24   Rhys Boyer T, PA-C  fluticasone  (FLONASE ) 50 MCG/ACT nasal spray Place 2 sprays into both nostrils 2 (two) times daily as needed for allergies or rhinitis.    [provider]  Galcanezumab -gnlm (EMGALITY ) 120 MG/ML SOAJ Inject 120 mg into the skin  every 28 (twenty-eight) days. 11/17/23   Skeet Juliene SAUNDERS, DO  lidocaine  (LIDODERM ) 5 % Place 1 patch onto the skin daily. 04/19/21   [provider]  loratadine  (CLARITIN ) 10 MG tablet Take 10 mg by mouth daily.    [provider]  magnesium  30 MG tablet Take 30 mg by mouth at bedtime.    [provider]  magnesium  gluconate (MAGONATE) 500 MG tablet Take 500 mg by mouth 2 (two) times daily.    [provider]  meloxicam  (MOBIC ) 7.5 MG tablet Take 1 tablet (7.5 mg total) by  mouth daily as needed for pain. 02/03/24   Raulkar, Sven SQUIBB, MD  methocarbamol  (ROBAXIN ) 500 MG tablet TAKE 1 TABLET BY MOUTH EVERY 6 HOURS AS NEEDED FOR MUSCLE SPASMS. 01/20/24   Raulkar, Sven SQUIBB, MD  modafinil  (PROVIGIL ) 100 MG tablet Take 0.5-1 tablets (50-100 mg total) by mouth every morning. Prn 10/13/23   Rhys Boyer T, PA-C  nortriptyline  (PAMELOR ) 25 MG capsule Take 1 capsule QAM, 1 capsule at noon, and 2 capsules in the evening. 02/08/24   Rhys, Boyer T, PA-C  oxyCODONE -acetaminophen  (PERCOCET) 10-325 MG tablet Take 1 tablet by mouth every 6 (six) hours as needed for pain. 02/03/24   Raulkar, Sven SQUIBB, MD  rizatriptan  (MAXALT ) 10 MG tablet Take 1 tablet (10 mg total) by mouth daily as needed for migraine. 07/13/23   Raulkar, Sven SQUIBB, MD  saccharomyces boulardii (FLORASTOR) 250 MG capsule Take 250 mg by mouth daily.    [provider]  Ubrogepant  (UBRELVY ) 100 MG TABS Take 1 tablet (100 mg total) by mouth as needed. May repeat after 2 hours.  Maximum 2 tablets in 24 hours 11/17/23   Skeet Juliene SAUNDERS, DO  UNABLE TO FIND CPAP    [provider]                                                                                                                                    Past Surgical History Past Surgical History:  Procedure Laterality Date   ABDOMINAL HYSTERECTOMY     BACK SURGERY     BUNIONECTOMY Bilateral    EYE SURGERY     laser surgery   NASAL SINUS SURGERY     x2   REVERSE SHOULDER ARTHROPLASTY Right 07/03/2022   Procedure: REVERSE SHOULDER ARTHROPLASTY;  Surgeon: Kay Kemps, MD;  Location: [facility];  Service: Orthopedics;  Laterality: Right;  120 min choice with interscalene block   SACROILIAC JOINT FUSION Bilateral 09/12/2020   Procedure: REMOVAL OF BILATERAL PELVIC SCREWS;  Surgeon: Beuford Anes, MD;  Location: [facility];  Service: Orthopedics;  Laterality: Bilateral;   SKIN CANCER EXCISION     face x3   TUBAL LIGATION     Family History Family History   Problem Relation Age of Onset   Depression Mother    Heart attack Mother    COPD Mother    Depression  Father    Heart attack Father    ADD / ADHD Son     Social History Social History   Tobacco Use   Smoking status: Never   Smokeless tobacco: Never  Vaping Use   Vaping status: Never Used  Substance Use Topics   Alcohol  use: No   Drug use: No   Allergies Gabapentin, Amitriptyline , Amlodipine , Oxcarbazepine, Oxycodone -acetaminophen , Polymyxin b, Pregabalin , Zolpidem, Sumatriptan , Cymbalta  [duloxetine  hcl], Duloxetine , Fluoxetine, Lamotrigine, and Other  Review of Systems Review of Systems  Physical Exam Vital Signs  I have reviewed the triage vital signs BP 131/82 (BP Location: Left Arm)   Pulse 86   Temp (!) 97.5 F (36.4 C)   Resp 16   Ht 5' 3.5 (1.613 m)   Wt 59.9 kg   SpO2 99%   BMI 23.02 kg/m   Physical Exam Vitals and nursing note reviewed.  Cardiovascular:     Rate and Rhythm: Normal rate.  Pulmonary:     Effort: Pulmonary effort is normal.  Musculoskeletal:        General: Tenderness present. No swelling or deformity. Normal range of motion.     Cervical back: Normal range of motion.  Skin:    General: Skin is warm.  Neurological:     General: No focal deficit present.     Mental Status: She is alert.     Comments: Patient 5-5/strength with the left, plantar and dorsi flexion.  No sign of anesthesia.  5 out of 5 grip strength bilaterally.     ED Results and Treatments Labs (all labs ordered are listed, but only abnormal results are displayed) Labs Reviewed  URINALYSIS, W/ REFLEX TO CULTURE (INFECTION SUSPECTED) - Abnormal; Notable for the following components:      Result Value   Hgb urine dipstick SMALL (*)    Bacteria, UA RARE (*)    All other components within normal limits                                                                                                                          Radiology CT Lumbar Spine Wo  Contrast Result Date: 02/22/2024 CLINICAL DATA:  68 year old female with mid back pain progressing for or a week or more. Lower extremity tingling. History of compression fracture. Prior surgery. EXAM: CT LUMBAR SPINE WITHOUT CONTRAST TECHNIQUE: Multidetector CT imaging of the lumbar spine was performed without intravenous contrast administration. Multiplanar CT image reconstructions were also generated. RADIATION DOSE REDUCTION: This exam was performed according to the departmental dose-optimization program which includes automated exposure control, adjustment of the mA and/or kV according to patient size and/or use of iterative reconstruction technique. COMPARISON:  Thoracic spine CT reported separately today. Lumbar CT 03/29/2021. FINDINGS: Segmentation: Normal, concordant with the thoracic numbering today. And same numbering system used on the 2022 CT. Alignment: Stable lordosis since 2022. Vertebrae: S1-S2 sacral fractures appear healed since the 2022 CT. Mild residual central S2 sacral angulation on series 10, image 29. Superimposed  chronic postoperative changes to the lower lumbar spine which are further detailed below. Solid-appearing posterior element arthrodesis from L3 through to the sacrum. Underlying generalized osteopenia. Stable vertebral height since 2022. Visible lower thoracic levels appear intact and No acute osseous abnormality identified. Paraspinal and other soft tissues: Normal caliber abdominal aorta. Aortoiliac calcified atherosclerosis. Negative visible noncontrast abdominal viscera. Postoperative changes to the lumbar paraspinal soft tissues with no adverse features. Disc levels: Mild for age spine degeneration T12-L1 and L1-L2. Disc bulging and posterior element hypertrophy at L2-L3 with mild spinal stenosis (series 10, image 30). L3-L4: Chronic decompression and fusion. Removed pedicle screws since 2022. Interbody implant remains. Stable L4 superior endplate subsidence. Solid-appearing  posterior element arthrodesis (coronal image 36). L4-L5: Chronic decompression and fusion. Removed pedicle screws since 2022. Interbody implant remains. Solid arthrodesis. L5-S1: Chronic decompression and fusion. Removed pedicle screws since 2022. Interbody implant remains. Interbody vacuum phenomena here which is chronic. And there does appear to be solid bilateral posterior element arthrodesis now (coronal image 36) plus evidence of right far lateral disc space arthrodesis (coronal image 28). IMPRESSION: 1. No acute osseous abnormality in the Lumbar Spine. Healed S2 sacral fracture since 2022. 2. Chronic decompression and fusion L3 through S1. All pedicle screws removed since 2022. Solid-appearing posterior element arthrodesis L3 through the sacrum. 3. Adjacent segment disease at L2-L3 with mild spinal stenosis. 4.  Aortic Atherosclerosis (ICD10-I70.0). Electronically Signed   By: VEAR Hurst M.D.   On: 02/22/2024 10:04   CT Thoracic Spine Wo Contrast Result Date: 02/22/2024 CLINICAL DATA:  68 year old female with mid back pain progressing for or a week or more. Lower extremity tingling. History of compression fracture. EXAM: CT THORACIC SPINE WITHOUT CONTRAST TECHNIQUE: Multidetector CT images of the thoracic were obtained using the standard protocol without intravenous contrast. RADIATION DOSE REDUCTION: This exam was performed according to the departmental dose-optimization program which includes automated exposure control, adjustment of the mA and/or kV according to patient size and/or use of iterative reconstruction technique. COMPARISON:  Chest CT 11/12/2020.  Cervical spine MRI 01/22/2023. FINDINGS: Limited cervical spine imaging: Stable compared to MRI last year, negative for age. Thoracic spine segmentation:  Normal. Alignment: Stable thoracic kyphosis since 2022. Mild dextroconvex thoracic scoliosis is more apparent. No spondylolisthesis. Vertebrae: Chronic osteopenia. Stable thoracic vertebral height  since 2022, including T8 endplate Schmorl's nodes. No thoracic compression fracture, vertebral fracture. Visible posterior ribs appear intact. Paraspinal and other soft tissues: Visible noncontrast chest appears stable since 2022. No pericardial or pleural effusion. Mild Calcified aortic atherosclerosis. Negative visible noncontrast upper abdominal viscera. Negative thoracic paraspinal soft tissues. Disc levels: Mild for age thoracic spine degeneration and capacious thoracic spinal canal. No CT evidence of thoracic spinal stenosis. IMPRESSION: 1. Osteopenia with no acute osseous abnormality in the Thoracic Spine. 2. Mild for age thoracic spine degeneration. Capacious thoracic spinal canal. Electronically Signed   By: VEAR Hurst M.D.   On: 02/22/2024 09:58    Pertinent labs & imaging results that were available during my care of the patient were reviewed by me and considered in my medical decision making (see MDM for details).  Medications Ordered in ED Medications - No data to display  Procedures Procedures  (including critical care time)  Medical Decision Making / ED Course   This patient presents to the ED for concern of worsening chronic back pain, this involves an extensive number of treatment options, and is a complaint that carries with it a high risk of complications and morbidity.  The differential diagnosis includes chronic back pain, less likely compression, less likely demyelinating process.  MDM: Santina into the room, patient sitting in wheelchair with right leg crossed over left knee, holding and reading a book.  Overall appears quite comfortable.  Palpated the patient's back, had some paraspinal muscle tenderness.  She had no significant midline spinous process tenderness.  No evidence of fever.  Lower suspicion for cord compression.  Lower suspicion for  infectious process.  Obtain CT imaging which did not show any evidence of stenosis.  Perhaps some peripheral neuropathy.  Patient will follow-up with her primary care doctor.  Results discussed with patient.  Reviewed the patient's urinalysis.  No evidence of infection.   Additional history obtained:  -External records from outside source obtained and reviewed including: Chart review including previous notes, labs, imaging, consultation notes   Lab Tests: -I ordered, reviewed, and interpreted labs.   The pertinent results include:   Labs Reviewed  URINALYSIS, W/ REFLEX TO CULTURE (INFECTION SUSPECTED) - Abnormal; Notable for the following components:      Result Value   Hgb urine dipstick SMALL (*)    Bacteria, UA RARE (*)    All other components within normal limits       Imaging Studies ordered: I ordered imaging studies including ET lumbar and thoracic spine I independently visualized and interpreted imaging. I agree with the radiologist interpretation   Medicines ordered and prescription drug management: No orders of the defined types were placed in this encounter.   -I have reviewed the patients home medicines and have made adjustments as needed   Cardiac Monitoring: The patient was maintained on a cardiac monitor.  I personally viewed and interpreted the cardiac monitored which showed an underlying rhythm of: Normal sinus rhythm  Social Determinants of Health:  Factors impacting patients care include: Lack of access to primary care   Reevaluation: After the interventions noted above, I reevaluated the patient and found that they have :improved  Co morbidities that complicate the patient evaluation  Past Medical History:  Diagnosis Date   Allergic rhinitis    Anxiety    Arthritis    Cancer (HCC)    skin   Chronic pain    Chronically dry eyes    Depression    Fibromyalgia    GERD (gastroesophageal reflux disease)    Headache    Osteoporosis    Sleep  apnea    Stroke (HCC)    from gabapenting per pt no deficits   Vitamin D  deficiency    Wears glasses         Final Clinical Impression(s) / ED Diagnoses Final diagnoses:  None     @PCDICTATION @    Mannie Pac T, DO 02/22/24 1037

## 2024-02-24 ENCOUNTER — Emergency Department (HOSPITAL_BASED_OUTPATIENT_CLINIC_OR_DEPARTMENT_OTHER): Admission: EM | Admit: 2024-02-24 | Discharge: 2024-02-24 | Disposition: A | Discharge status: Home / Self Care

## 2024-02-24 ENCOUNTER — Encounter (HOSPITAL_BASED_OUTPATIENT_CLINIC_OR_DEPARTMENT_OTHER): Payer: Self-pay | Admitting: Emergency Medicine

## 2024-02-24 ENCOUNTER — Other Ambulatory Visit: Payer: Self-pay

## 2024-02-24 DIAGNOSIS — Z8673 Personal history of transient ischemic attack (TIA), and cerebral infarction without residual deficits: Secondary | ICD-10-CM | POA: Diagnosis not present

## 2024-02-24 DIAGNOSIS — R3915 Urgency of urination: Secondary | ICD-10-CM | POA: Insufficient documentation

## 2024-02-24 DIAGNOSIS — M545 Low back pain, unspecified: Secondary | ICD-10-CM | POA: Diagnosis present

## 2024-02-24 DIAGNOSIS — G8929 Other chronic pain: Secondary | ICD-10-CM

## 2024-02-24 DIAGNOSIS — M546 Pain in thoracic spine: Secondary | ICD-10-CM | POA: Diagnosis not present

## 2024-02-24 LAB — URINALYSIS, ROUTINE W REFLEX MICROSCOPIC
Bacteria, UA: NONE SEEN
Bilirubin Urine: NEGATIVE
Glucose, UA: NEGATIVE mg/dL
Hgb urine dipstick: NEGATIVE
Ketones, ur: NEGATIVE mg/dL
Nitrite: NEGATIVE
Protein, ur: NEGATIVE mg/dL
Specific Gravity, Urine: 1.017 (ref 1.005–1.030)
pH: 8 (ref 5.0–8.0)

## 2024-02-24 MED ORDER — KETOROLAC TROMETHAMINE 30 MG/ML IJ SOLN
30.0000 mg | Freq: Once | INTRAMUSCULAR | Status: AC
  Administered 2024-02-24: 30 mg via INTRAMUSCULAR
  Filled 2024-02-24: qty 1

## 2024-02-24 MED ORDER — IBUPROFEN 600 MG PO TABS
600.0000 mg | ORAL_TABLET | Freq: Four times a day (QID) | ORAL | 0 refills | Status: DC | PRN
Start: 2024-02-24 — End: 2024-04-07

## 2024-02-24 NOTE — ED Notes (Signed)
 Unable to provide urine sample at this time. Provided with water  per patient request and provider approval.

## 2024-02-24 NOTE — ED Notes (Signed)
 Called lab to add urine culture.

## 2024-02-24 NOTE — ED Provider Notes (Signed)
 [place] EMERGENCY DEPARTMENT AT [facility] Provider Note   CSN: [id number] Arrival date & time: 02/24/24  9096     Patient presents with: No chief complaint on file.   Sharon Logan is a 68 y.o. female.   HPI   68 year old female presents emergency department complaints of back pain.  States that she has been having worsening back pain for the past 3 weeks or so.  Has had 2 ED visits for similar symptoms.  States that her symptoms are not getting better.  States that she does have an appointment with the neurosurgeon on Monday.  Is on chronic oxycodone  which she states helps when she takes it but takes it every 8-12 hours or so.  Denies any weakness or sensory deficits in upper or lower extremities, saddle anesthesia, bowel/bladder dysfunction, fever, history of IV drug use.  Does report increased urinary frequency but denies any dysuria.  She does state that she has been having pain rating down to her foot/ankle.  Has benefited from injections in her back from similar symptoms in the past but has not followed up with the spinal doctor to have these performed.  Denies any chest pain, shortness of breath, abdominal pain, nausea, vomiting.  Past medical history significant for depression, chronic pain, fibromyalgia, malignancy, GERD, osteoporosis, CVA, OSA  Prior to Admission medications   Medication Sig Start Date End Date Taking? Authorizing Provider  famotidine (PEPCID) 40 MG tablet Take 40 mg by mouth every evening. 01/26/24  Yes [provider]  tiZANidine  (ZANAFLEX ) 4 MG tablet Take 4 mg by mouth every 6 (six) hours as needed. for pain 02/09/24  Yes [provider]  alendronate (FOSAMAX) 70 MG tablet Take 70 mg by mouth once a week.    [provider]  amLODipine  (NORVASC ) 5 MG tablet Take 5 mg by mouth daily. 03/11/22   [provider]  BIOTIN PO Take by mouth.    [provider]  Carboxymethylcellulose Sodium (THERATEARS OP)  Place 1 drop into both eyes 4 (four) times daily as needed (dry eyes).    [provider]  Cholecalciferol  (VITAMIN D3) 25 MCG (1000 UT) CAPS Take by mouth.    [provider]  diclofenac  Sodium (VOLTAREN ) 1 % GEL Apply 2 g topically daily as needed (pain). 09/22/21   [provider]  EPINEPHrine  0.3 mg/0.3 mL IJ SOAJ injection Inject 0.3 mg into the muscle as needed for anaphylaxis. 05/26/21   [provider]  Eszopiclone  3 MG TABS Take 1 tablet (3 mg total) by mouth at bedtime as needed. Take immediately before bedtime 02/22/24   Rhys Boyer T, PA-C  fluticasone  (FLONASE ) 50 MCG/ACT nasal spray Place 2 sprays into both nostrils 2 (two) times daily as needed for allergies or rhinitis.    [provider]  Galcanezumab -gnlm (EMGALITY ) 120 MG/ML SOAJ Inject 120 mg into the skin every 28 (twenty-eight) days. 11/17/23   Skeet Juliene SAUNDERS, DO  lidocaine  (LIDODERM ) 5 % Place 1 patch onto the skin daily. 04/19/21   [provider]  loratadine  (CLARITIN ) 10 MG tablet Take 10 mg by mouth daily.    [provider]  magnesium  30 MG tablet Take 30 mg by mouth at bedtime.    [provider]  magnesium  gluconate (MAGONATE) 500 MG tablet Take 500 mg by mouth 2 (two) times daily.    [provider]  meloxicam  (MOBIC ) 7.5 MG tablet Take 1 tablet (7.5 mg total) by mouth daily as needed for pain.  Patient not taking: Reported on 02/22/2024 02/03/24   Raulkar, Sven SQUIBB, MD  methocarbamol  (ROBAXIN ) 500 MG tablet TAKE 1 TABLET BY MOUTH EVERY 6 HOURS AS NEEDED FOR MUSCLE SPASMS. 01/20/24   Raulkar, Sven SQUIBB, MD  nortriptyline  (PAMELOR ) 75 MG capsule Take 1 capsule (75 mg total) by mouth 2 (two) times daily. 02/22/24   Rhys Boyer T, PA-C  oxyCODONE -acetaminophen  (PERCOCET) 10-325 MG tablet Take 1 tablet by mouth every 6 (six) hours as needed for pain. 02/03/24   Raulkar, Sven SQUIBB, MD  rizatriptan  (MAXALT ) 10 MG tablet Take 1 tablet (10 mg total) by mouth  daily as needed for migraine. 07/13/23   Raulkar, Sven SQUIBB, MD  saccharomyces boulardii (FLORASTOR) 250 MG capsule Take 250 mg by mouth daily.    [provider]  Ubrogepant  (UBRELVY ) 100 MG TABS Take 1 tablet (100 mg total) by mouth as needed. May repeat after 2 hours.  Maximum 2 tablets in 24 hours 11/17/23   Jaffe, Adam R, DO  UNABLE TO FIND CPAP    [provider]    Allergies: Gabapentin, Amitriptyline , Amlodipine , Oxcarbazepine, Oxycodone -acetaminophen , Polymyxin b, Pregabalin , Zolpidem, Sumatriptan , Cymbalta  [duloxetine  hcl], Duloxetine , Fluoxetine, Lamotrigine, and Other    Review of Systems  All other systems reviewed and are negative.   Updated Vital Signs There were no vitals taken for this visit.  Physical Exam Vitals and nursing note reviewed.  Constitutional:      General: She is not in acute distress.    Appearance: She is well-developed.  HENT:     Head: Normocephalic and atraumatic.  Eyes:     Conjunctiva/sclera: Conjunctivae normal.  Cardiovascular:     Rate and Rhythm: Normal rate and regular rhythm.     Pulses: Normal pulses.  Pulmonary:     Effort: Pulmonary effort is normal. No respiratory distress.     Breath sounds: Normal breath sounds. No wheezing, rhonchi or rales.  Abdominal:     Palpations: Abdomen is soft.     Tenderness: There is no abdominal tenderness.  Musculoskeletal:        General: No swelling.     Cervical back: Neck supple.     Comments: Paraspinal tenderness noted bilaterally thoracic as well as lumbar region with left slightly greater than right.  Straight leg raise positive bilaterally.  Muscular strength 5 out of 5 bilateral upper and lower extremities.  No sensory deficits in major nerve distributions of upper lower extremities.  DTR symmetric at patella as well as Achilles.  Skin:    General: Skin is warm and dry.     Capillary Refill: Capillary refill takes less than 2 seconds.  Neurological:     Mental Status: She  is alert.  Psychiatric:        Mood and Affect: Mood normal.     (all labs ordered are listed, but only abnormal results are displayed) Labs Reviewed - No data to display  EKG: None  Radiology: CT Lumbar Spine Wo Contrast Result Date: 02/22/2024 CLINICAL DATA:  68 year old female with mid back pain progressing for or a week or more. Lower extremity tingling. History of compression fracture. Prior surgery. EXAM: CT LUMBAR SPINE WITHOUT CONTRAST TECHNIQUE: Multidetector CT imaging of the lumbar spine was performed without intravenous contrast administration. Multiplanar CT image reconstructions were also generated. RADIATION DOSE REDUCTION: This exam was performed according to the departmental dose-optimization program which includes automated exposure control, adjustment of the mA and/or kV according to patient size and/or use of iterative reconstruction technique. COMPARISON:  Thoracic spine CT reported separately today. Lumbar CT 03/29/2021. FINDINGS: Segmentation: Normal, concordant with the thoracic numbering today. And same numbering system used on the 2022 CT. Alignment: Stable lordosis since 2022. Vertebrae: S1-S2 sacral fractures appear healed since the 2022 CT. Mild residual central S2 sacral angulation on series 10, image 29. Superimposed chronic postoperative changes to the lower lumbar spine which are further detailed below. Solid-appearing posterior element arthrodesis from L3 through to the sacrum. Underlying generalized osteopenia. Stable vertebral height since 2022. Visible lower thoracic levels appear intact and No acute osseous abnormality identified. Paraspinal and other soft tissues: Normal caliber abdominal aorta. Aortoiliac calcified atherosclerosis. Negative visible noncontrast abdominal viscera. Postoperative changes to the lumbar paraspinal soft tissues with no adverse features. Disc levels: Mild for age spine degeneration T12-L1 and L1-L2. Disc bulging and posterior element  hypertrophy at L2-L3 with mild spinal stenosis (series 10, image 30). L3-L4: Chronic decompression and fusion. Removed pedicle screws since 2022. Interbody implant remains. Stable L4 superior endplate subsidence. Solid-appearing posterior element arthrodesis (coronal image 36). L4-L5: Chronic decompression and fusion. Removed pedicle screws since 2022. Interbody implant remains. Solid arthrodesis. L5-S1: Chronic decompression and fusion. Removed pedicle screws since 2022. Interbody implant remains. Interbody vacuum phenomena here which is chronic. And there does appear to be solid bilateral posterior element arthrodesis now (coronal image 36) plus evidence of right far lateral disc space arthrodesis (coronal image 28). IMPRESSION: 1. No acute osseous abnormality in the Lumbar Spine. Healed S2 sacral fracture since 2022. 2. Chronic decompression and fusion L3 through S1. All pedicle screws removed since 2022. Solid-appearing posterior element arthrodesis L3 through the sacrum. 3. Adjacent segment disease at L2-L3 with mild spinal stenosis. 4.  Aortic Atherosclerosis (ICD10-I70.0). Electronically Signed   By: VEAR Hurst M.D.   On: 02/22/2024 10:04   CT Thoracic Spine Wo Contrast Result Date: 02/22/2024 CLINICAL DATA:  68 year old female with mid back pain progressing for or a week or more. Lower extremity tingling. History of compression fracture. EXAM: CT THORACIC SPINE WITHOUT CONTRAST TECHNIQUE: Multidetector CT images of the thoracic were obtained using the standard protocol without intravenous contrast. RADIATION DOSE REDUCTION: This exam was performed according to the departmental dose-optimization program which includes automated exposure control, adjustment of the mA and/or kV according to patient size and/or use of iterative reconstruction technique. COMPARISON:  Chest CT 11/12/2020.  Cervical spine MRI 01/22/2023. FINDINGS: Limited cervical spine imaging: Stable compared to MRI last year, negative for age.  Thoracic spine segmentation:  Normal. Alignment: Stable thoracic kyphosis since 2022. Mild dextroconvex thoracic scoliosis is more apparent. No spondylolisthesis. Vertebrae: Chronic osteopenia. Stable thoracic vertebral height since 2022, including T8 endplate Schmorl's nodes. No thoracic compression fracture, vertebral fracture. Visible posterior ribs appear intact. Paraspinal and other soft tissues: Visible noncontrast chest appears stable since 2022. No pericardial or pleural effusion. Mild Calcified aortic atherosclerosis. Negative visible noncontrast upper abdominal viscera. Negative thoracic paraspinal soft tissues. Disc levels: Mild for age thoracic spine degeneration and capacious thoracic spinal canal. No CT evidence of thoracic spinal stenosis. IMPRESSION: 1. Osteopenia with no acute osseous abnormality in the Thoracic Spine. 2. Mild for age thoracic spine degeneration. Capacious thoracic spinal canal. Electronically Signed   By: VEAR Hurst M.D.   On: 02/22/2024 09:58     Procedures   Medications Ordered in the ED - No data to display  Clinical Course as of 02/24/24 1137  Thu Feb 24, 2024  1134 Consulted attending Dr. Neysa who is in agreement treatment plan going forward. [CR]  Clinical Course User Index [CR] Silver Wonda LABOR, PA                                 Medical Decision Making Amount and/or Complexity of Data Reviewed Labs: ordered.  Risk Prescription drug management.   This patient presents to the ED for concern of back pain, this involves an extensive number of treatment options, and is a complaint that carries with it a high risk of complications and morbidity.  The differential diagnosis includes fracture, strain/sprain, dislocation, ACS, PE, aortic dissection, spinal cord compression/impingement, neuropathy, fibromyalgia, pancreatitis, gastritis, PUD, and other   Co morbidities that complicate the patient evaluation  See HPI   Additional history  obtained:  Additional history obtained from EMR External records from outside source obtained and reviewed including hospital records   Lab Tests:  I Ordered, and personally interpreted labs.  The pertinent results include: UA leukocytes present trace but otherwise.  Urine culture pending.   Imaging Studies ordered:  N/a   Cardiac Monitoring: / EKG:  The patient was maintained on a cardiac monitor.  I personally viewed and interpreted the cardiac monitored which showed an underlying rhythm of: Sinus rhythm.  Probable left atrial enlargement.  Nonspecific T wave changes anterolaterally.  No acute ischemic change from prior EKG performed.  Consultations Obtained:  See ED course  Problem List / ED Course / Critical interventions / Medication management  Back pain I ordered medication including Toradol    Reevaluation of the patient after these medicines showed that the patient improved I have reviewed the patients home medicines and have made adjustments as needed   Social Determinants of Health:  Denies tobacco, licit drug use.   Test / Admission - Considered:  Back pain Vitals signs within normal range and stable throughout visit. Laboratory studies significant for: See above  68 year old female presents emergency department complaints of back pain.  States that she has been having worsening back pain for the past 3 weeks or so.  Has had 2 ED visits for similar symptoms.  States that her symptoms are not getting better.  States that she does have an appointment with the neurosurgeon on Monday.  Is on chronic oxycodone  which she states helps when she takes it but takes it every 8-12 hours or so.  Denies any weakness or sensory deficits in upper or lower extremities, saddle anesthesia, bowel/bladder dysfunction, fever, history of IV drug use.  Does report increased urinary frequency but denies any dysuria.  She does state that she has been having pain rating down to her  foot/ankle.  Has benefited from injections in her back from similar symptoms in the past but has not followed up with the spinal doctor to have these performed.  Denies any chest pain, shortness of breath, abdominal pain, nausea, vomiting. On exam, paraspinal tenderness noted in thoracic region/upper lumbar region as above. No red flag signs for back pain on HPI/PE; low suspicion for spinal cord compression/impingement, cauda equina, spinal epidural abscess.  Patient without abdominal pain rating to back, pulse deficits, neurodeficits, hypertension; low suspicion for dissection.  Patient just had CT imaging 2 days ago that was grossly reassuring did not show obvious occult fracture or dislocation.  Suspect MSK etiology of symptoms.  States she has benefited from spinal injections in the past.  Has upcoming appointment on Monday with neurosurgery.  Would recommend continued at home symptomatic management of symptoms and lieu of appointment  with specialist in the next few days.  I do not think patient meets criteria for emergent MRI at this time.  Patient is UA did have trace leukocytes but otherwise negative RBC, WBC, bacteria.  Will culture urine.,  Treatment plan discussed with patient and she knowledge understanding was agreeable.  Patient well-appearing, afebrile in no acute distress upon discharge. Worrisome signs and symptoms were discussed with the patient, and the patient acknowledged understanding to return to the ED if noticed. Patient was stable upon discharge.       Final diagnoses:  None    ED Discharge Orders     None          Silver Wonda LABOR, [place] 02/24/24 1137    Neysa Caron PARAS, [place] 02/24/24 1538

## 2024-02-24 NOTE — Discharge Instructions (Signed)
 As discussed, we will send in Motrin  to help with your back pain.  You may alter this with your pain medication.  Recommend follow-up with the spinal specialist.  Your urine had some leukocytes in it but was otherwise reassuring.  We will culture this and if it grows out bacteria, we will call you and send an antibiotic.

## 2024-02-24 NOTE — ED Triage Notes (Signed)
 C/o back pain x 2 weeks. States seen at [facility] yesterday and had CT yesterday. Was told results were negative. Reports tingling in bilateral arms and legs. Denies incontinence issues.

## 2024-02-25 LAB — URINE CULTURE

## 2024-02-28 ENCOUNTER — Ambulatory Visit: Admitting: Physician Assistant

## 2024-03-13 ENCOUNTER — Encounter: Attending: Physical Medicine and Rehabilitation | Admitting: Registered Nurse

## 2024-03-13 ENCOUNTER — Ambulatory Visit: Admitting: Registered Nurse

## 2024-03-13 ENCOUNTER — Encounter: Payer: Self-pay | Admitting: Registered Nurse

## 2024-03-13 VITALS — BP 123/77 | HR 86 | Ht 63.5 in | Wt 135.0 lb

## 2024-03-13 DIAGNOSIS — Z5181 Encounter for therapeutic drug level monitoring: Secondary | ICD-10-CM | POA: Diagnosis present

## 2024-03-13 DIAGNOSIS — Z79891 Long term (current) use of opiate analgesic: Secondary | ICD-10-CM | POA: Diagnosis present

## 2024-03-13 DIAGNOSIS — G894 Chronic pain syndrome: Secondary | ICD-10-CM | POA: Insufficient documentation

## 2024-03-13 DIAGNOSIS — M79671 Pain in right foot: Secondary | ICD-10-CM | POA: Insufficient documentation

## 2024-03-13 DIAGNOSIS — M48061 Spinal stenosis, lumbar region without neurogenic claudication: Secondary | ICD-10-CM | POA: Insufficient documentation

## 2024-03-13 DIAGNOSIS — G8929 Other chronic pain: Secondary | ICD-10-CM | POA: Diagnosis present

## 2024-03-13 DIAGNOSIS — M797 Fibromyalgia: Secondary | ICD-10-CM | POA: Insufficient documentation

## 2024-03-13 DIAGNOSIS — M546 Pain in thoracic spine: Secondary | ICD-10-CM

## 2024-03-13 MED ORDER — OXYCODONE-ACETAMINOPHEN 10-325 MG PO TABS: 1.0000 | ORAL_TABLET | Freq: Four times a day (QID) | ORAL | 0 refills | Status: DC | PRN

## 2024-03-13 NOTE — Progress Notes (Unsigned)
 Subjective:    Patient ID: Sharon Logan, female    DOB: 10/14/1955, 68 y.o.   MRN: [id number]  HPI: Sharon Logan is a 68 y.o. female who returns for follow up appointment for chronic pain and medication refill. states *** pain is located in  ***. rates pain ***. current exercise regime is walking and performing stretching exercises.  Ms. Lavoy Morphine  equivalent is *** MME.   Last Oral Swab was Performed on 12/15/2023, it was consistent.    Pain Inventory Average Pain 6 Pain Right Now 7 My pain is sharp, burning, tingling, and aching  In the last 24 hours, has pain interfered with the following? General activity 7 Relation with others 7 Enjoyment of life 8 What TIME of day is your pain at its worst? daytime Sleep (in general) Fair  Pain is worse with: walking, bending, and standing Pain improves with: rest and heat/ice Relief from Meds: N/A  Family History  Problem Relation Age of Onset   Depression Mother    Heart attack Mother    COPD Mother    Depression Father    Heart attack Father    ADD / ADHD Son    Social History   Socioeconomic History   Marital status: Widowed    Spouse name: Not on file   Number of children: 2   Years of education: Not on file   Highest education level: Not on file  Occupational History   Not on file  Tobacco Use   Smoking status: Never   Smokeless tobacco: Never  Vaping Use   Vaping status: Never Used  Substance and Sexual Activity   Alcohol  use: No   Drug use: No   Sexual activity: Not Currently    Birth control/protection: Surgical    Comment: Hysterectomy  Other Topics Concern   Not on file  Social History Narrative   Right handed   Lives in a one story home   Drinks Tea   Social Drivers of Health   Financial Resource Strain: Low Risk  (11/24/2023)   Received from [facility]   Overall Financial Resource Strain (CARDIA)    Difficulty of Paying Living Expenses: Not hard at all  Food Insecurity: No Food Insecurity  (11/24/2023)   Received from [facility]   Hunger Vital Sign    Within the past 12 months, you worried that your food would run out before you got the money to buy more.: Never true    Within the past 12 months, the food you bought just didn't last and you didn't have money to get more.: Never true  Transportation Needs: No Transportation Needs (11/24/2023)   Received from [facility] - Transportation    Lack of Transportation (Medical): No    Lack of Transportation (Non-Medical): No  Physical Activity: Sufficiently Active (11/24/2023)   Received from [facility]   Exercise Vital Sign    On average, how many days per week do you engage in moderate to strenuous exercise (like a brisk walk)?: 7 days    On average, how many minutes do you engage in exercise at this level?: 30 min  Stress: No Stress Concern Present (11/24/2023)   Received from [facility] - Occupational Stress Questionnaire    Feeling of Stress : Not at all  Social Connections: Socially Integrated (11/24/2023)   Received from [facility]   Social Network    How would you rate your social  network (family, work, friends)?: Good participation with social networks   Past Surgical History:  Procedure Laterality Date   ABDOMINAL HYSTERECTOMY     BACK SURGERY     BUNIONECTOMY Bilateral    EYE SURGERY     laser surgery   NASAL SINUS SURGERY     x2   REVERSE SHOULDER ARTHROPLASTY Right 07/03/2022   Procedure: REVERSE SHOULDER ARTHROPLASTY;  Surgeon: Kay Kemps, MD;  Location: [facility];  Service: Orthopedics;  Laterality: Right;  120 min choice with interscalene block   SACROILIAC JOINT FUSION Bilateral 09/12/2020   Procedure: REMOVAL OF BILATERAL PELVIC SCREWS;  Surgeon: Beuford Anes, MD;  Location: [facility] OR;  Service: Orthopedics;  Laterality: Bilateral;   SKIN CANCER EXCISION     face x3   TUBAL LIGATION     Past Surgical History:  Procedure Laterality Date    ABDOMINAL HYSTERECTOMY     BACK SURGERY     BUNIONECTOMY Bilateral    EYE SURGERY     laser surgery   NASAL SINUS SURGERY     x2   REVERSE SHOULDER ARTHROPLASTY Right 07/03/2022   Procedure: REVERSE SHOULDER ARTHROPLASTY;  Surgeon: Kay Kemps, MD;  Location: [facility];  Service: Orthopedics;  Laterality: Right;  120 min choice with interscalene block   SACROILIAC JOINT FUSION Bilateral 09/12/2020   Procedure: REMOVAL OF BILATERAL PELVIC SCREWS;  Surgeon: Beuford Anes, MD;  Location: [facility] OR;  Service: Orthopedics;  Laterality: Bilateral;   SKIN CANCER EXCISION     face x3   TUBAL LIGATION     Past Medical History:  Diagnosis Date   Allergic rhinitis    Anxiety    Arthritis    Cancer (HCC)    skin   Chronic pain    Chronically dry eyes    Depression    Fibromyalgia    GERD (gastroesophageal reflux disease)    Headache    Osteoporosis    Sleep apnea    Stroke (HCC)    from gabapenting per pt no deficits   Vitamin D  deficiency    Wears glasses    BP 123/77 (BP Location: Left Arm, Patient Position: Sitting, Cuff Size: Normal)   Pulse 86   Ht 5' 3.5 (1.613 m)   Wt 135 lb (61.2 kg)   SpO2 96%   BMI 23.54 kg/m   Opioid Risk Score:   Fall Risk Score:  `1  Depression screen [facility] 2/9     12/15/2023   10:43 AM 11/16/2023   11:00 AM 08/12/2023   11:14 AM 02/18/2023   10:58 AM 10/26/2022   10:59 AM 09/28/2022   10:09 AM 07/27/2022   10:17 AM  Depression screen PHQ 2/9  Decreased Interest 1 1 1  0 0 0 1  Down, Depressed, Hopeless 1 1 1  0 0 0 1  PHQ - 2 Score 2 2 2  0 0 0 2  Altered sleeping 1        Tired, decreased energy 1        Change in appetite 0        Feeling bad or failure about yourself  0        Moving slowly or fidgety/restless 0        Suicidal thoughts 0        PHQ-9 Score 4        Difficult doing work/chores Somewhat difficult            Review of Systems  Musculoskeletal:  Low back pain radiating down bilateral legs  All other systems reviewed and  are negative.      Objective:   Physical Exam        Assessment & Plan:  Thoracic Spine Pain: Continue HEP as tolerated. Continue to Monitor.  Lumbar Spinal Stenosis without neurogenic claudication: Continue HEP as tolerated. Continue to Monitor.  Fibromyalgia: Continue HEP as Tolerated. Continue to monitor.  Neuropathy: Continue current medication regimen. Continue to monitor.  Chronic Pain Syndrome: Refilled: Oxycodone  10/325 mg one tablet every 6 hours as needed for pain #120 We will continue the opioid monitoring program, this consists of regular clinic visits, examinations, urine drug screen, pill counts as well as use of [place]  Controlled Substance Reporting system. A 12 month History has been reviewed on the [place]  Controlled Substance Reporting System on 12/15/2023.   F/U in 1 month

## 2024-03-16 ENCOUNTER — Other Ambulatory Visit: Payer: Self-pay | Admitting: Physician Assistant

## 2024-03-18 ENCOUNTER — Other Ambulatory Visit: Payer: Self-pay | Admitting: Physician Assistant

## 2024-04-03 ENCOUNTER — Encounter: Payer: Self-pay | Admitting: Physician Assistant

## 2024-04-03 ENCOUNTER — Ambulatory Visit (INDEPENDENT_AMBULATORY_CARE_PROVIDER_SITE_OTHER): Admitting: Physician Assistant

## 2024-04-03 DIAGNOSIS — F331 Major depressive disorder, recurrent, moderate: Secondary | ICD-10-CM

## 2024-04-03 DIAGNOSIS — F339 Major depressive disorder, recurrent, unspecified: Secondary | ICD-10-CM | POA: Diagnosis not present

## 2024-04-03 DIAGNOSIS — G894 Chronic pain syndrome: Secondary | ICD-10-CM

## 2024-04-03 DIAGNOSIS — F4310 Post-traumatic stress disorder, unspecified: Secondary | ICD-10-CM | POA: Diagnosis not present

## 2024-04-03 DIAGNOSIS — G4700 Insomnia, unspecified: Secondary | ICD-10-CM | POA: Diagnosis not present

## 2024-04-03 NOTE — Progress Notes (Signed)
 [facility]  Patient ID: Sharon Logan,  MRN: [id number]  PCP: Sharon Erminio LITTIE, MD  Date of Evaluation: 04/03/2024 Time spent:20 minutes  Chief Complaint:  Chief Complaint   Depression; Insomnia; Follow-up    HISTORY/CURRENT STATUS: HPI  For routine med check.  Sharon Logan is doing 'ok.' The nortriptyline  is helping.  She tried to increase to 100 mg twice daily but had severe dry mouth so she had to cut back to 75 mg twice daily.  She still has a lot of physical pain in multiple sites.  She continues to see orthopedics and pain management, but doesn't have the pain relief she needs.   No extreme sadness, tearfulness, or feelings of hopelessness.  Sleeps ok with the Lunesta .  ADLs and personal hygiene are normal.   Denies any changes in concentration, making decisions, or remembering things.  Appetite has not changed.  Weight is stable.   No mania, delirium, AH/VH.  No SI/HI.  Individual Medical History/ Review of Systems: Changes? :No     Past Medication Trials: Cymbalta - Started after back surgery. Has made her tired, even when reduced to 30 mg. Was having to take 2 hour naps. May have helped slightly with pain. Had HA, Constipation, Nausea Savella- muscle aches, rhabdomyolysis Effexor-HA, helped with excessive sweating.  Pristiq - May have increased depression. Prozac- Constipation. Causes her to feel cold.  Celexa - Worsening depression at 20 mg dose Lexapro - My anti-depressant of choice and gives her energy. Was taking 2-3 years prior to surgery.  Zoloft- worsening mood Paxil Viibryd - Felt depression was more heavy. Wellbutrin - Did not cause HA's. Tolerated well and was effective for depression. Auvelity - felt drugged and was forgetting appointments.  Amitriptyline - Did not cause HA's. Weight gain. Effective. Caused dry mouth. Nortriptyline - Did not cause HA's. Effective. Lamictal- Rash Gabapentin -Rhabomyolysis Lyrica - Adverse reaction Buspar - Helpful for  anxiety in the past. Well-tolerated. Tried again and had severe anxiety.  Trazodone - helped with sleep initiation but sleep was not restful. Severe dry mouth. Ambien- Parasomnia, sleep walking Lunesta - Tolerated, effective at 2 mg dose Remeron - Dry eyes, internal restlessness. Stomach discomfort.  Clonidine  Topamax - ineffective for headaches. Caused cognitive side effects.  Hydroxyzine- Initially helped with anxiety and sleep initiation, then no longer as helpful  Allergies: Gabapentin, Amitriptyline , Amlodipine , Oxcarbazepine, Oxycodone -acetaminophen , Polymyxin b, Pregabalin , Zolpidem, Sumatriptan , Cymbalta  [duloxetine  hcl], Duloxetine , Fluoxetine, Lamotrigine, and Other  Current Medications:  Current Outpatient Medications:    alendronate (FOSAMAX) 70 MG tablet, Take 70 mg by mouth once a week., Disp: , Rfl:    amLODipine  (NORVASC ) 5 MG tablet, Take 5 mg by mouth daily., Disp: , Rfl:    BIOTIN PO, Take by mouth., Disp: , Rfl:    Carboxymethylcellulose Sodium (THERATEARS OP), Place 1 drop into both eyes 4 (four) times daily as needed (dry eyes)., Disp: , Rfl:    Cholecalciferol  (VITAMIN D3) 25 MCG (1000 UT) CAPS, Take by mouth., Disp: , Rfl:    diclofenac  Sodium (VOLTAREN ) 1 % GEL, Apply 2 g topically daily as needed (pain)., Disp: , Rfl:    EPINEPHrine  0.3 mg/0.3 mL IJ SOAJ injection, Inject 0.3 mg into the muscle as needed for anaphylaxis., Disp: , Rfl:    Eszopiclone  3 MG TABS, Take 1 tablet (3 mg total) by mouth at bedtime as needed. Take immediately before bedtime, Disp: 30 tablet, Rfl: 5   famotidine (PEPCID) 40 MG tablet, Take 40 mg by mouth every evening., Disp: , Rfl:    fluticasone  (FLONASE ) 50 MCG/ACT nasal spray, Place 2 sprays  into both nostrils 2 (two) times daily as needed for allergies or rhinitis., Disp: , Rfl:    Galcanezumab -gnlm (EMGALITY ) 120 MG/ML SOAJ, Inject 120 mg into the skin every 28 (twenty-eight) days., Disp: 1.12 mL, Rfl: 5   ibuprofen  (ADVIL ) 600 MG tablet,  Take 1 tablet (600 mg total) by mouth every 6 (six) hours as needed., Disp: 30 tablet, Rfl: 0   lidocaine  (LIDODERM ) 5 %, Place 1 patch onto the skin daily., Disp: , Rfl:    loratadine  (CLARITIN ) 10 MG tablet, Take 10 mg by mouth daily., Disp: , Rfl:    magnesium  30 MG tablet, Take 30 mg by mouth at bedtime., Disp: , Rfl:    magnesium  gluconate (MAGONATE) 500 MG tablet, Take 500 mg by mouth 2 (two) times daily., Disp: , Rfl:    meloxicam  (MOBIC ) 7.5 MG tablet, Take 1 tablet (7.5 mg total) by mouth daily as needed for pain., Disp: 30 tablet, Rfl: 3   methocarbamol  (ROBAXIN ) 500 MG tablet, TAKE 1 TABLET BY MOUTH EVERY 6 HOURS AS NEEDED FOR MUSCLE SPASMS., Disp: 90 tablet, Rfl: 3   nortriptyline  (PAMELOR ) 75 MG capsule, Take 1 capsule (75 mg total) by mouth 2 (two) times daily., Disp: 60 capsule, Rfl: 1   oxyCODONE -acetaminophen  (PERCOCET) 10-325 MG tablet, Take 1 tablet by mouth every 6 (six) hours as needed for pain., Disp: 120 tablet, Rfl: 0   rizatriptan  (MAXALT ) 10 MG tablet, Take 1 tablet (10 mg total) by mouth daily as needed for migraine., Disp: 90 tablet, Rfl: 3   saccharomyces boulardii (FLORASTOR) 250 MG capsule, Take 250 mg by mouth daily., Disp: , Rfl:    tiZANidine  (ZANAFLEX ) 4 MG tablet, Take 4 mg by mouth every 6 (six) hours as needed. for pain, Disp: , Rfl:    Ubrogepant  (UBRELVY ) 100 MG TABS, Take 1 tablet (100 mg total) by mouth as needed. May repeat after 2 hours.  Maximum 2 tablets in 24 hours, Disp: 10 tablet, Rfl: 5   UNABLE TO FIND, CPAP, Disp: , Rfl:  Medication Side Effects: dry mouth  Family Medical/ Social History: Changes? No  MENTAL HEALTH EXAM:  There were no vitals taken for this visit.There is no height or weight on file to calculate BMI.  General Appearance: Casual and Well Groomed  Eye Contact:  Good  Speech:  Clear and Coherent and Normal Rate  Volume:  Normal  Mood:  Euthymic  Affect:  Congruent  Thought Process:  Goal Directed and Descriptions of  Associations: Circumstantial  Orientation:  Full (Time, Place, and Person)  Thought Content: Logical   Suicidal Thoughts:  No  Homicidal Thoughts:  No  Memory:  WNL  Judgement:  Good  Insight:  Good  Psychomotor Activity:  limps when walking. No tremor.  Concentration:  Concentration: Good and Attention Span: Fair  Recall:  Good  Fund of Knowledge: Good  Language: Good  Assets:  Communication Skills Desire for Improvement Financial Resources/Insurance Housing Transportation Vocational/Educational  ADL's:  Intact  Cognition: WNL  Prognosis:  Good   EKG 02/17/2024 showed normal sinus rhythm possible anterior infarct, age undetermined, QT interval normal.  See results on chart.  DIAGNOSES:    ICD-10-CM   1. Major depression, recurrent, chronic  F33.9     2. Insomnia, unspecified type  G47.00     3. PTSD (post-traumatic stress disorder)  F43.10     4. Moderate recurrent major depression (HCC)  F33.1     5. Chronic pain syndrome  G89.4  Receiving Psychotherapy: No   RECOMMENDATIONS:   PDMP reviewed. Lunesta  filled 03/19/2024.  Oxycodone  known to me. I provided approximately  20 minutes of face to face time during this encounter, including time spent before and after the visit in records review, medical decision making, counseling pertinent to today's visit, and charting.   We discussed the Nortriptyline .  She may be able to tolerate a higher dose if it is split up.  I recommend she take 75 mg in the morning, 25 mg in the evening and 25 to 50 mg in the evening.  Or she can split it up however she feels it works as long as not over 200 mg total per day.  Hopefully that will help with mood and give some pain relief without causing the severe dry mouth.  She has 25 mg pills at home so she will be able to experiment with the dose.  When she runs out at the 25 mg pills she can call and request a refill.  She will let me know if the way she is taking it is effective and  well-tolerated and I will send prescription in accordingly.  (I am not changing the directions on her med list yet.  I want to see what dose works best for her before changing it.)  Continue Lunesta  to 3 mg nightly as needed. Change nortriptyline  as noted above. Continue vitamins as per med list. Continue CPAP machine. Return in 6 weeks.   Verneita Cooks, PA-C

## 2024-04-04 ENCOUNTER — Encounter: Attending: Physical Medicine and Rehabilitation | Admitting: Physical Medicine and Rehabilitation

## 2024-04-04 ENCOUNTER — Encounter: Payer: Self-pay | Admitting: Physical Medicine and Rehabilitation

## 2024-04-04 VITALS — BP 127/83 | HR 85 | Ht 63.5 in | Wt 137.0 lb

## 2024-04-04 DIAGNOSIS — M7918 Myalgia, other site: Secondary | ICD-10-CM | POA: Diagnosis not present

## 2024-04-04 MED ORDER — LIDOCAINE HCL 1 % IJ SOLN
5.0000 mL | Freq: Once | INTRAMUSCULAR | Status: AC
  Administered 2024-04-04: 5 mL via INTRADERMAL

## 2024-04-04 MED ORDER — OXYCODONE HCL 15 MG PO TABS: 15.0000 mg | ORAL_TABLET | Freq: Three times a day (TID) | ORAL | 0 refills | Status: DC | PRN

## 2024-04-04 NOTE — Progress Notes (Signed)

## 2024-04-06 ENCOUNTER — Ambulatory Visit: Admitting: Physician Assistant

## 2024-04-07 ENCOUNTER — Encounter: Payer: Self-pay | Admitting: Family

## 2024-04-07 ENCOUNTER — Ambulatory Visit: Admitting: Family

## 2024-04-07 VITALS — BP 138/90 | HR 83 | Ht 63.5 in | Wt 137.0 lb

## 2024-04-07 DIAGNOSIS — G629 Polyneuropathy, unspecified: Secondary | ICD-10-CM | POA: Diagnosis not present

## 2024-04-07 DIAGNOSIS — G479 Sleep disorder, unspecified: Secondary | ICD-10-CM

## 2024-04-07 DIAGNOSIS — M51362 Other intervertebral disc degeneration, lumbar region with discogenic back pain and lower extremity pain: Secondary | ICD-10-CM | POA: Diagnosis not present

## 2024-04-07 DIAGNOSIS — R61 Generalized hyperhidrosis: Secondary | ICD-10-CM | POA: Diagnosis not present

## 2024-04-07 DIAGNOSIS — I7300 Raynaud's syndrome without gangrene: Secondary | ICD-10-CM | POA: Diagnosis not present

## 2024-04-07 DIAGNOSIS — G43809 Other migraine, not intractable, without status migrainosus: Secondary | ICD-10-CM

## 2024-04-07 DIAGNOSIS — M810 Age-related osteoporosis without current pathological fracture: Secondary | ICD-10-CM

## 2024-04-07 DIAGNOSIS — J3089 Other allergic rhinitis: Secondary | ICD-10-CM

## 2024-04-07 MED ORDER — AMLODIPINE BESYLATE 5 MG PO TABS: 5.0000 mg | ORAL_TABLET | Freq: Every day | ORAL | 1 refills | Status: AC

## 2024-04-07 MED ORDER — BLACK COHOSH 200 MG PO CAPS: 1.0000 | ORAL_CAPSULE | Freq: Three times a day (TID) | ORAL | 0 refills | Status: AC

## 2024-04-07 NOTE — Patient Instructions (Addendum)
 Welcome to [facility], It was a pleasure meeting you today!    As discussed, I have sent your Amlodipine  refill to your pharmacy.  You can try to increase dose to 7.5mg  = 1.5 tablets to help with your hand coldness (Raynaud's).  Look for Omron brand blood pressure automated machine that has an upper arm cuff. Check that the Amlodipine  is not lowering your BP too much, goal is 105-130/60-80. If it is dropping too low, then reduce the dose back to 1 pill daily.  Talk with your other providers about the possibility of Pamelor  & Oxycodone  contributing to your daily sweating and night sweats. You can try over the counter Black Cohosh, up to 400mg  2-3 times per day to see if this lessens the sweating.  There is also a RX treatment, Veozah, but need to check with insurance if covered.  Please schedule a 3 month follow up visit today.    PLEASE NOTE: If you had any LAB tests please let us  know if you have not heard back within a few days. You may see your results on MyChart before we have a chance to review them but we will give you a call once they are reviewed by us . If we ordered any REFERRALS today, please let us  know if you have not heard from their office within the next week.  Let us  know through MyChart if you are needing REFILLS, or have your pharmacy send us  the request. You can also use MyChart to communicate with me or any office staff.  Please try these tips to maintain a healthy lifestyle: It is important that you exercise regularly at least 30 minutes 5 times a week. Think about what you will eat, plan ahead. Choose whole foods, & think  clean, green, fresh or frozen over canned, processed or packaged foods which are more sugary, salty, and fatty. 70 to 75% of food eaten should be fresh vegetables and protein. 2-3  meals daily with healthy snacks between meals, but must be whole fruit, protein or vegetables. Aim to eat over a 10 hour period when you  are active, for example, 7am to 5pm, and then STOP after your last meal of the day, drinking only water .  Shorter eating windows, 6-8 hours, are showing benefits in heart disease and blood sugar regulation. Drink water  every day! Shoot for 64 ounces daily = 8 cups, no other drink is as healthy! Fruit juice is best enjoyed in a healthy way, by EATING the fruit.

## 2024-04-07 NOTE — Progress Notes (Signed)
 New Patient Office Visit  Subjective:  Patient ID: Sharon Logan, female    DOB: 1955-10-14  Age: 68 y.o. MRN: [id number]  CC:  Chief Complaint  Patient presents with   New Patient (Initial Visit)   Leg Pain    Pt c/o bilateral leg pain, present for 6 months. Pain fluctuates, Has tried oxycodone  15 mg.    Night Sweats   HPI Sharon Logan presents for establishing care today. Discussed the use of AI scribe software for clinical note transcription with the patient, who gave verbal consent to proceed.  History of Present Illness Sharon Logan is a 68 year old female with chronic pain who presents with leg pain.  She experiences constant achy pain in her legs, similar to muscle soreness, radiating from her back and affecting the side muscles of her legs. She has chronic pain and neuropathy, managed with oxycodone  three times daily. She has back issues, with a full back MRI in June 2025 and a CT scan in September 2025, and received a cortisone shot in the upper back. She attends physical therapy and recently started gym workouts, which she finds beneficial. She takes methocarbamol  twice daily, occasionally a third dose. Meloxicam  was previously tried without benefit.  Her medication regimen includes nortriptyline  75mg  during the day & 75mg  dose to adjust as needed, for depression and neuropathy pain, but is causing dry mouth and sleepiness during the day. She experiences daily sweating episodes, attributed to postmenopausal changes. She takes Emgality  for migraines, which causes headaches post-administration and will talk to her neurologist about switching. Amlodipine  5 mg in the morning helps with Raynaud's phenomenon, though she notes increased cold sensitivity. She also takes Lunesta  for sleep, Zyrtec for allergies, and Flonase  for congestion. She is retired, lives alone, and is active in her community.  Assessment & Plan Chronic pain syndrome with low back pain Chronic pain with radiating  leg pain persists despite treatment. Nortriptyline  causes dry mouth and sleepiness. Oxycodone  provides some relief but both meds may contribute to diaphoresis. - Continue oxycodone  as prescribed. - Continue nortriptyline  75 mg  twice daily as prescribed. - Continue methocarbamol  500mg  twice daily, with an additional dose as needed. - Encourage continued physical therapy and exercise. - Use Biotene spray or lozenges and lemon or sour foods for dry mouth.  Migraine Managed with Emgality  injections. Headaches persist post-injection. Considering alternatives due to side effects. Ubrelvy  effective but insurance issues present. - Continue Emgality  injections. - Discuss other options with Neurologist.  Depression Managed with nortriptyline , aiding in pain management. Side effects include dry mouth and sleepiness. - Continue nortriptyline  75 mg bid, discuss dosing with Psych provider.  Allergic rhinitis Managed with Zyrtec and Flonase . Reports dry mouth, possibly from antihistamine in combo with Pamelor . Allergic to multiple environmental factors. - Continue Zyrtec at night. - Use Flonase  as needed. - Discuss alternatives to Zyrtec with allergist to reduce dry mouth.  Osteoporosis Managed with Fosamax and calcium supplements. Started Fosamax 2 yrs ago, states Prolia  caused hypocalcemia. Monitoring vitamin D  intake for postmenopausal bone health. - Continue Fosamax weekly. - Ensure adequate calcium and vitamin D3 intake, aiming for 4000 IU of vitamin D3 daily.  Hyperhidrosis Started after menopause around age [age] and has persisted. Seen by ENDO, lab work negative. Possibly due to medications, recently seen in 01/2024, told Oxybutynin could help but could give bad SE.  - Discussed trying OTC Black cohosh up to 400mg  tid prn. - Reduce caffeine intake  Raynaud's phenomenon Managed with  amlodipine  5mg  for cold digits. Considering dose increase due to persistent symptoms. - Consider increasing  amlodipine  to 7.5 mg, monitor for side effects. - Encourage use of warming gloves and hot packs. - Advise home blood pressure monitoring, aiming for 105-130/60-80 mmHg. - Consider purchasing an Omron blood pressure monitor.  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 Daytime drowsiness possibly related to nortriptyline . - Continue Lunesta  at bedtime. - Monitor sleep patterns with medication adjustments.   Subjective:    Outpatient Medications Prior to Visit  Medication Sig Dispense Refill   alendronate (FOSAMAX) 70 MG tablet Take 70 mg by mouth once a week.     BIOTIN PO Take by mouth.     Cholecalciferol  (VITAMIN D3) 25 MCG (1000 UT) CAPS Take by mouth.     Cyanocobalamin  (VITAMIN B12 PO) Take by mouth.     EPINEPHrine  0.3 mg/0.3 mL IJ SOAJ injection Inject 0.3 mg into the muscle as needed for anaphylaxis.     Eszopiclone  3 MG TABS Take 1 tablet (3 mg total) by mouth at bedtime as needed. Take immediately before bedtime 30 tablet 5   famotidine (PEPCID) 40 MG tablet Take 40 mg by mouth every evening.     fluticasone  (FLONASE ) 50 MCG/ACT nasal spray Place 2 sprays into both nostrils 2 (two) times daily as needed for allergies or rhinitis.     Galcanezumab -gnlm (EMGALITY ) 120 MG/ML SOAJ Inject 120 mg into the skin every 28 (twenty-eight) days. 1.12 mL 5   lidocaine  (LIDODERM ) 5 % Place 1 patch onto the skin daily.     magnesium  gluconate (MAGONATE) 500 MG tablet Take 500 mg by mouth 2 (two) times daily.     methocarbamol  (ROBAXIN ) 500 MG tablet TAKE 1 TABLET BY MOUTH EVERY 6 HOURS AS NEEDED FOR MUSCLE SPASMS. 90 tablet 3   nortriptyline  (PAMELOR ) 75 MG capsule Take 1 capsule (75 mg total) by mouth 2 (two) times daily. 60 capsule 1   OVER THE COUNTER MEDICATION Bone health(calcium) - [employer]     oxyCODONE  (ROXICODONE ) 15 MG immediate release tablet Take 1 tablet (15 mg total) by mouth every 8 (eight) hours as needed for  pain. 90 tablet 0   saccharomyces boulardii (FLORASTOR) 250 MG capsule Take 250 mg by mouth daily.     UNABLE TO FIND CPAP     amLODipine  (NORVASC ) 5 MG tablet Take 5 mg by mouth daily.     ibuprofen  (ADVIL ) 600 MG tablet Take 1 tablet (600 mg total) by mouth every 6 (six) hours as needed. 30 tablet 0   magnesium  30 MG tablet Take 30 mg by mouth at bedtime.     meloxicam  (MOBIC ) 7.5 MG tablet Take 1 tablet (7.5 mg total) by mouth daily as needed for pain. 30 tablet 3   cetirizine (ZYRTEC) 10 MG tablet Take 10 mg by mouth.     Carboxymethylcellulose Sodium (THERATEARS OP) Place 1 drop into both eyes 4 (four) times daily as needed (dry eyes).     diclofenac  Sodium (VOLTAREN ) 1 % GEL Apply 2 g topically daily as needed (pain).     loratadine  (CLARITIN ) 10 MG tablet Take 10 mg by mouth daily.     rizatriptan  (MAXALT ) 10 MG tablet Take 1 tablet (10 mg total) by mouth daily as needed for migraine. 90 tablet 3   tiZANidine  (ZANAFLEX ) 4 MG tablet Take 4 mg by mouth every 6 (six) hours as needed. for pain  Ubrogepant  (UBRELVY ) 100 MG TABS Take 1 tablet (100 mg total) by mouth as needed. May repeat after 2 hours.  Maximum 2 tablets in 24 hours 10 tablet 5   No facility-administered medications prior to visit.   Past Medical History:  Diagnosis Date   Allergic rhinitis    Anxiety    Arthritis    Cancer (HCC)    skin   Chronic pain    Chronic sinusitis 07/27/2018   Chronically dry eyes    Depression    Fibromyalgia    GERD (gastroesophageal reflux disease)    Headache    Osteoporosis    Overweight with body mass index (BMI) 25.0-29.9 07/22/2016   Radiculopathy 06/29/2019   Sleep apnea    Stroke (HCC)    from gabapenting per pt no deficits   Vitamin D  deficiency    Wears glasses    Past Surgical History:  Procedure Laterality Date   ABDOMINAL HYSTERECTOMY     BACK SURGERY     BUNIONECTOMY Bilateral    EYE SURGERY     laser surgery   NASAL SINUS SURGERY     x2   REVERSE SHOULDER  ARTHROPLASTY Right 07/03/2022   Procedure: REVERSE SHOULDER ARTHROPLASTY;  Surgeon: Kay Kemps, MD;  Location: [facility];  Service: Orthopedics;  Laterality: Right;  120 min choice with interscalene block   SACROILIAC JOINT FUSION Bilateral 09/12/2020   Procedure: REMOVAL OF BILATERAL PELVIC SCREWS;  Surgeon: Beuford Anes, MD;  Location: [facility];  Service: Orthopedics;  Laterality: Bilateral;   SKIN CANCER EXCISION     face x3   TUBAL LIGATION      Objective:   Today's Vitals: BP (!) 138/90   Pulse 83   Ht 5' 3.5 (1.613 m)   Wt 137 lb (62.1 kg)   SpO2 94%   BMI 23.89 kg/m   Physical Exam Vitals and nursing note reviewed.  Constitutional:      Appearance: Normal appearance.  Cardiovascular:     Rate and Rhythm: Normal rate and regular rhythm.  Pulmonary:     Effort: Pulmonary effort is normal.     Breath sounds: Normal breath sounds.  Musculoskeletal:        General: Normal range of motion.  Skin:    General: Skin is warm and dry.  Neurological:     Mental Status: She is alert.  Psychiatric:        Mood and Affect: Mood normal.        Behavior: Behavior normal.     Meds ordered this encounter  Medications   amLODipine  (NORVASC ) 5 MG tablet    Sig: Take 1 tablet (5 mg total) by mouth daily.    Dispense:  90 tablet    Refill:  1    Supervising Provider:   ANDY, CAMILLE L [[id number]]   Black Cohosh 200 MG CAPS    Sig: Take 1 capsule (200 mg total) by mouth 3 (three) times daily.    Dispense:  90 capsule    Refill:  0    Supervising Provider:   ANDY, CAMILLE L [[id number]]    Lucius Krabbe, NP

## 2024-04-15 ENCOUNTER — Other Ambulatory Visit: Payer: Self-pay | Admitting: Physician Assistant

## 2024-04-19 ENCOUNTER — Telehealth: Payer: Self-pay | Admitting: Physician Assistant

## 2024-04-19 NOTE — Telephone Encounter (Signed)
 Yes, decrease to 50 mg qhs

## 2024-04-19 NOTE — Telephone Encounter (Signed)
 Pt called reporting med issues with Nortriptyline . Dose increase was too much. Pt weaned back to 75 mg now not wanting to get out of bed. Apt 12/4. No available apt before. On canc list.  Contact Pt @ [phone]

## 2024-04-19 NOTE — Telephone Encounter (Signed)
 Pt was prescribed nortriptyline  75 mg BID. She reports it was too strong and she was having trouble getting out of bed. She decreased to 75 mg every day 2 weeks ago and still reporting it is too strong. She is requesting to go down to 50 mg. She does have some 25 mg tablets on hand.  Pharmacy - [facility] on [address].

## 2024-04-19 NOTE — Telephone Encounter (Signed)
 LVM per DPR with recommendation to take 50 mg at bedtime and to let us  know when RF needed.

## 2024-04-26 ENCOUNTER — Encounter: Payer: Self-pay | Admitting: Physician Assistant

## 2024-04-26 ENCOUNTER — Ambulatory Visit (INDEPENDENT_AMBULATORY_CARE_PROVIDER_SITE_OTHER): Admitting: Physician Assistant

## 2024-04-26 DIAGNOSIS — F4310 Post-traumatic stress disorder, unspecified: Secondary | ICD-10-CM | POA: Diagnosis not present

## 2024-04-26 DIAGNOSIS — G4700 Insomnia, unspecified: Secondary | ICD-10-CM | POA: Diagnosis not present

## 2024-04-26 DIAGNOSIS — F411 Generalized anxiety disorder: Secondary | ICD-10-CM | POA: Diagnosis not present

## 2024-04-26 DIAGNOSIS — F329 Major depressive disorder, single episode, unspecified: Secondary | ICD-10-CM

## 2024-04-26 NOTE — Progress Notes (Signed)
 [facility]  Patient ID: Sharon Logan,  MRN: [id number]  PCP: Lucius Krabbe, NP  Date of Evaluation: 04/26/2024 Time spent:25 minutes  Chief Complaint:  Chief Complaint   Depression; Follow-up    HISTORY/CURRENT STATUS: HPI  For routine med check.  Wasn't able to tolerate the higher dose of Nortriptyline .  Drowsy.  Still feels down a lot of the time.  Energy and motivation are fair. Is retired.   No feelings of hopelessness.  Sleeps fairly well.  Lunesta  helps.  ADLs and personal hygiene are normal.  Still has a lot of pain which limits her sometimes.  Denies any changes in concentration, making decisions, or remembering things.  Appetite has not changed.  Weight is stable.  No PA but does get overwhelmed.   No mania, delirium, AH/VH.  No SI/HI.  Individual Medical History/ Review of Systems: Changes? :No     Past Medication Trials: Cymbalta - Started after back surgery. Has made her tired, even when reduced to 30 mg. Was having to take 2 hour naps. May have helped slightly with pain. Had HA, Constipation, Nausea Savella- muscle aches, rhabdomyolysis Effexor-HA, helped with excessive sweating.  Pristiq - May have increased depression. Prozac- Constipation. Causes her to feel cold.  Celexa - Worsening depression at 20 mg dose Lexapro - My anti-depressant of choice and gives her energy. Was taking 2-3 years prior to surgery.  Zoloft- worsening mood Paxil Viibryd - Felt depression was more heavy. Wellbutrin - Did not cause HA's. Tolerated well and was effective for depression. Auvelity - felt drugged and was forgetting appointments.  Amitriptyline - Did not cause HA's. Weight gain. Effective. Caused dry mouth. Nortriptyline - Did not cause HA's. Effective. Lamictal- Rash Gabapentin -Rhabomyolysis Lyrica - Adverse reaction Buspar - Helpful for anxiety in the past. Well-tolerated. Tried again and had severe anxiety.  Trazodone - helped with sleep initiation but sleep  was not restful. Severe dry mouth. Ambien- Parasomnia, sleep walking Lunesta - Tolerated, effective at 2 mg dose Remeron - Dry eyes, internal restlessness. Stomach discomfort.  Clonidine  Topamax - ineffective for headaches. Caused cognitive side effects.  Hydroxyzine- Initially helped with anxiety and sleep initiation, then no longer as helpful  Allergies: Amlodipine , Gabapentin, Nortriptyline , Polymyxin b, Pregabalin , Zolpidem, Amitriptyline , Lamotrigine, Oxcarbazepine, Oxycodone -acetaminophen , Oxycodone , Sumatriptan , Cymbalta  [duloxetine  hcl], Duloxetine , Fluoxetine, and Other  Current Medications:  Current Outpatient Medications:    alendronate (FOSAMAX) 70 MG tablet, Take 70 mg by mouth once a week., Disp: , Rfl:    amLODipine  (NORVASC ) 5 MG tablet, Take 1 tablet (5 mg total) by mouth daily., Disp: 90 tablet, Rfl: 1   BIOTIN PO, Take by mouth., Disp: , Rfl:    cetirizine (ZYRTEC) 10 MG tablet, Take 10 mg by mouth., Disp: , Rfl:    Cholecalciferol  (VITAMIN D3) 25 MCG (1000 UT) CAPS, Take by mouth., Disp: , Rfl:    Cyanocobalamin  (VITAMIN B12 PO), Take by mouth., Disp: , Rfl:    Eszopiclone  3 MG TABS, Take 1 tablet (3 mg total) by mouth at bedtime as needed. Take immediately before bedtime, Disp: 30 tablet, Rfl: 5   famotidine (PEPCID) 40 MG tablet, Take 40 mg by mouth every evening., Disp: , Rfl:    fluticasone  (FLONASE ) 50 MCG/ACT nasal spray, Place 2 sprays into both nostrils 2 (two) times daily as needed for allergies or rhinitis., Disp: , Rfl:    Galcanezumab -gnlm (EMGALITY ) 120 MG/ML SOAJ, Inject 120 mg into the skin every 28 (twenty-eight) days., Disp: 1.12 mL, Rfl: 5   lidocaine  (LIDODERM ) 5 %, Place 1 patch onto the skin daily., Disp: , Rfl:  magnesium  gluconate (MAGONATE) 500 MG tablet, Take 500 mg by mouth 2 (two) times daily., Disp: , Rfl:    methocarbamol  (ROBAXIN ) 500 MG tablet, TAKE 1 TABLET BY MOUTH EVERY 6 HOURS AS NEEDED FOR MUSCLE SPASMS., Disp: 90 tablet, Rfl: 3    nortriptyline  (PAMELOR ) 75 MG capsule, TAKE 1 CAPSULE BY MOUTH TWICE A DAY, Disp: 60 capsule, Rfl: 1   saccharomyces boulardii (FLORASTOR) 250 MG capsule, Take 250 mg by mouth daily., Disp: , Rfl:    Black Cohosh  200 MG CAPS, Take 1 capsule (200 mg total) by mouth 3 (three) times daily., Disp: 90 capsule, Rfl: 0   EPINEPHrine  0.3 mg/0.3 mL IJ SOAJ injection, Inject 0.3 mg into the muscle as needed for anaphylaxis., Disp: , Rfl:    OVER THE COUNTER MEDICATION, Bone health(calcium) - [employer], Disp: , Rfl:    oxyCODONE -acetaminophen  (PERCOCET) 10-325 MG tablet, Take 1 tablet by mouth every 6 (six) hours as needed for pain., Disp: 120 tablet, Rfl: 0   UNABLE TO FIND, CPAP, Disp: , Rfl:  Medication Side Effects: dry mouth  Family Medical/ Social History: Changes? No  MENTAL HEALTH EXAM:  There were no vitals taken for this visit.There is no height or weight on file to calculate BMI.  General Appearance: Casual and Well Groomed  Eye Contact:  Good  Speech:  Clear and Coherent and Normal Rate  Volume:  Normal  Mood:  Euthymic  Affect:  Congruent  Thought Process:  Goal Directed and Descriptions of Associations: Circumstantial  Orientation:  Full (Time, Place, and Person)  Thought Content: Logical   Suicidal Thoughts:  No  Homicidal Thoughts:  No  Memory:  WNL  Judgement:  Good  Insight:  Good  Psychomotor Activity:  limps when walking. No tremor.  Concentration:  Concentration: Good and Attention Span: Fair  Recall:  Good  Fund of Knowledge: Good  Language: Good  Assets:  Communication Skills Desire for Improvement Financial Resources/Insurance Housing Resilience Transportation Vocational/Educational  ADL's:  Intact  Cognition: WNL  Prognosis:  Good   EKG 02/17/2024 showed normal sinus rhythm possible anterior infarct, age undetermined, QT interval normal.  See results on chart.  DIAGNOSES:    ICD-10-CM   1. 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 PTSD (post-traumatic  stress disorder)  F43.10     3. Insomnia, unspecified type  G47.00     4. Generalized anxiety disorder  F41.1       Receiving Psychotherapy: No   RECOMMENDATIONS:   PDMP reviewed. Lunesta  filled 04/17/2024.  Oxycodone  known to me. I provided approximately  25  minutes of face to face time during this encounter, including time spent before and after the visit in records review, medical decision making, counseling pertinent to today's visit, and charting.   Long discussion about other treatent options for depression.   Disc lamp, ECT, Spravato.  TMS.  Pros and cons of each discussed.  Sharon Logan is interested in TMS.  Refer to [employer] Other med options:  change nortriptyline  to.Clomipramine, or add Lithium  Continue Lunesta  to 3 mg nightly as needed. Continue nortriptyline  75 mg. (She'll call for RF) Continue vitamins as per med list. Continue CPAP machine. Return in early Dec  Lexi Conaty, PA-C

## 2024-05-05 ENCOUNTER — Ambulatory Visit: Admitting: Registered Nurse

## 2024-05-05 ENCOUNTER — Encounter: Payer: Self-pay | Admitting: Registered Nurse

## 2024-05-05 ENCOUNTER — Encounter: Attending: Physical Medicine and Rehabilitation | Admitting: Registered Nurse

## 2024-05-05 VITALS — BP 124/82 | HR 97 | Ht 63.5 in | Wt 136.0 lb

## 2024-05-05 DIAGNOSIS — G894 Chronic pain syndrome: Secondary | ICD-10-CM | POA: Diagnosis present

## 2024-05-05 DIAGNOSIS — Z5181 Encounter for therapeutic drug level monitoring: Secondary | ICD-10-CM | POA: Insufficient documentation

## 2024-05-05 DIAGNOSIS — M48061 Spinal stenosis, lumbar region without neurogenic claudication: Secondary | ICD-10-CM | POA: Diagnosis present

## 2024-05-05 DIAGNOSIS — Z79891 Long term (current) use of opiate analgesic: Secondary | ICD-10-CM | POA: Diagnosis not present

## 2024-05-05 DIAGNOSIS — G629 Polyneuropathy, unspecified: Secondary | ICD-10-CM | POA: Diagnosis present

## 2024-05-05 MED ORDER — OXYCODONE-ACETAMINOPHEN 10-325 MG PO TABS: 1.0000 | ORAL_TABLET | Freq: Four times a day (QID) | ORAL | 0 refills | Status: AC | PRN

## 2024-05-05 NOTE — Progress Notes (Signed)
 Subjective:    Patient ID: Sharon Logan, female    DOB: 03-11-1956, 68 y.o.   MRN: [id number]  HPI: Sharon Logan is a 68 y.o. female who returns for follow up appointment for chronic pain and medication refill. She states her pain is located in her lower back and bilateral lower extremities and bilateral feet with tingling and burning. She rates her pain 6. Her current exercise regime is walking and performing stretching exercises.  Sharon Logan  equivalent is 60.00 MME.   Oral Swab was Performed today.       Pain Inventory Average Pain 5 Pain Right Now 6 My pain is sharp, tingling, and aching  In the last 24 hours, has pain interfered with the following? General activity 4 Relation with others 1 Enjoyment of life 4 What TIME of day is your pain at its worst? daytime and evening Sleep (in general) Fair  Pain is worse with: walking, bending, standing, and some activites Pain improves with: medication Relief from Meds: 6  Family History  Problem Relation Age of Onset   Depression Mother    Heart attack Mother    COPD Mother    Depression Father    Heart attack Father    ADD / ADHD Son    Social History   Socioeconomic History   Marital status: Widowed    Spouse name: Not on file   Number of children: 2   Years of education: Not on file   Highest education level: Not on file  Occupational History   Not on file  Tobacco Use   Smoking status: Never   Smokeless tobacco: Never  Vaping Use   Vaping status: Never Used  Substance and Sexual Activity   Alcohol  use: No   Drug use: No   Sexual activity: Not Currently    Birth control/protection: Surgical    Comment: Hysterectomy  Other Topics Concern   Not on file  Social History Narrative   Right handed   Lives in a one story home   Drinks Tea   Social Drivers of Health   Financial Resource Strain: Low Risk  (11/24/2023)   Received from [facility]   Overall Financial Resource Strain (CARDIA)     Difficulty of Paying Living Expenses: Not hard at all  Food Insecurity: No Food Insecurity (11/24/2023)   Received from [facility]   Hunger Vital Sign    Within the past 12 months, you worried that your food would run out before you got the money to buy more.: Never true    Within the past 12 months, the food you bought just didn't last and you didn't have money to get more.: Never true  Transportation Needs: No Transportation Needs (11/24/2023)   Received from [facility] - Transportation    Lack of Transportation (Medical): No    Lack of Transportation (Non-Medical): No  Physical Activity: Sufficiently Active (11/24/2023)   Received from [facility]   Exercise Vital Sign    On average, how many days per week do you engage in moderate to strenuous exercise (like a brisk walk)?: 7 days    On average, how many minutes do you engage in exercise at this level?: 30 min  Stress: No Stress Concern Present (11/24/2023)   Received from [facility] - Occupational Stress Questionnaire    Feeling of Stress : Not at all  Social Connections: Socially Integrated (11/24/2023)   Received from  [facility]   Social Network    How would you rate your social network (family, work, friends)?: Good participation with social networks   Past Surgical History:  Procedure Laterality Date   ABDOMINAL HYSTERECTOMY     BACK SURGERY     BUNIONECTOMY Bilateral    EYE SURGERY     laser surgery   NASAL SINUS SURGERY     x2   REVERSE SHOULDER ARTHROPLASTY Right 07/03/2022   Procedure: REVERSE SHOULDER ARTHROPLASTY;  Surgeon: Kay Kemps, MD;  Location: [facility];  Service: Orthopedics;  Laterality: Right;  120 min choice with interscalene block   SACROILIAC JOINT FUSION Bilateral 09/12/2020   Procedure: REMOVAL OF BILATERAL PELVIC SCREWS;  Surgeon: Beuford Anes, MD;  Location: [facility];  Service: Orthopedics;  Laterality: Bilateral;   SKIN CANCER EXCISION      face x3   TUBAL LIGATION     Past Surgical History:  Procedure Laterality Date   ABDOMINAL HYSTERECTOMY     BACK SURGERY     BUNIONECTOMY Bilateral    EYE SURGERY     laser surgery   NASAL SINUS SURGERY     x2   REVERSE SHOULDER ARTHROPLASTY Right 07/03/2022   Procedure: REVERSE SHOULDER ARTHROPLASTY;  Surgeon: Kay Kemps, MD;  Location: [facility];  Service: Orthopedics;  Laterality: Right;  120 min choice with interscalene block   SACROILIAC JOINT FUSION Bilateral 09/12/2020   Procedure: REMOVAL OF BILATERAL PELVIC SCREWS;  Surgeon: Beuford Anes, MD;  Location: [facility] OR;  Service: Orthopedics;  Laterality: Bilateral;   SKIN CANCER EXCISION     face x3   TUBAL LIGATION     Past Medical History:  Diagnosis Date   Allergic rhinitis    Anxiety    Arthritis    Cancer (HCC)    skin   Chronic pain    Chronic sinusitis 07/27/2018   Chronically dry eyes    Depression    Fibromyalgia    GERD (gastroesophageal reflux disease)    Headache    Osteoporosis    Overweight with body mass index (BMI) 25.0-29.9 07/22/2016   Radiculopathy 06/29/2019   Sleep apnea    Stroke (HCC)    from gabapenting per pt no deficits   Vitamin D  deficiency    Wears glasses    BP 124/82 (BP Location: Left Arm, Patient Position: Sitting, Cuff Size: Normal)   Pulse 97   Ht 5' 3.5 (1.613 m)   Wt 136 lb (61.7 kg)   SpO2 98%   BMI 23.71 kg/m   Opioid Risk Score:   Fall Risk Score:  `1  Depression screen [facility] 2/9     04/04/2024   10:42 AM 12/15/2023   10:43 AM 11/16/2023   11:00 AM 08/12/2023   11:14 AM 02/18/2023   10:58 AM 10/26/2022   10:59 AM 09/28/2022   10:09 AM  Depression screen PHQ 2/9  Decreased Interest 0 1 1 1  0 0 0  Down, Depressed, Hopeless  1 1 1  0 0 0  PHQ - 2 Score 0 2 2 2  0 0 0  Altered sleeping  1       Tired, decreased energy  1       Change in appetite  0       Feeling bad or failure about yourself   0       Moving slowly or fidgety/restless  0       Suicidal thoughts  0        PHQ-9 Score  4        Difficult doing work/chores  Somewhat difficult          Data saved with a previous flowsheet row definition       Review of Systems  Musculoskeletal:  Positive for myalgias.       Bilateral arm, hand, lower leg and foot pain  All other systems reviewed and are negative.      Objective:   Physical Exam Vitals and nursing note reviewed.  Constitutional:      Appearance: Normal appearance.  Cardiovascular:     Rate and Rhythm: Normal rate and regular rhythm.     Pulses: Normal pulses.     Heart sounds: Normal heart sounds.  Pulmonary:     Effort: Pulmonary effort is normal.     Breath sounds: Normal breath sounds.  Musculoskeletal:     Comments: Normal Muscle Bulk and Muscle Testing Reveals:  Upper Extremities:Full  ROM and Muscle Strength 5/5  Lumbar Paraspinal Tenderness: L-4-L-5 Lower Extremities : Full ROM and Muscle Strength 5/5 Arises from Table slowly Narrow Based Gait     Skin:    General: Skin is warm and dry.  Neurological:     Mental Status: She is alert and oriented to person, place, and time.  Psychiatric:        Mood and Affect: Mood normal.        Behavior: Behavior normal.          Assessment & Plan:  Thoracic Spine Pain: No complaints today. Continue HEP as tolerated. Continue to Monitor. 05/05/2024 Lumbar Spinal Stenosis without neurogenic claudication: Continue HEP as tolerated. Continue to Monitor. 05/05/2024 Fibromyalgia: Continue HEP as Tolerated. Continue to monitor. 05/05/2024 Neuropathy: Continue current medication regimen. Continue to monitor. 05/05/2024 Chronic Pain Syndrome: Refilled: Oxycodone  10/325 mg one tablet every 6 hours as needed for pain #120 We will continue the opioid monitoring program, this consists of regular clinic visits, examinations, urine drug screen, pill counts as well as use of [place]  Controlled Substance Reporting system. A 12 month History has been reviewed on the [place]   Controlled Substance Reporting System on 05/05/2024.   F/U in 1 month

## 2024-05-09 LAB — DRUG TOX MONITOR 1 W/CONF, ORAL FLD
Amphetamines: NEGATIVE ng/mL (ref <10)
Barbiturates: NEGATIVE ng/mL (ref <10)
Benzodiazepines: NEGATIVE ng/mL (ref <0.50)
Buprenorphine: NEGATIVE ng/mL (ref <0.10)
Cocaine: NEGATIVE ng/mL (ref <5.0)
Codeine: NEGATIVE ng/mL (ref <2.5)
Dihydrocodeine: NEGATIVE ng/mL (ref <2.5)
Fentanyl: NEGATIVE ng/mL (ref <0.10)
Heroin Metabolite: NEGATIVE ng/mL (ref <1.0)
Hydrocodone: NEGATIVE ng/mL (ref <2.5)
Hydromorphone: NEGATIVE ng/mL (ref <2.5)
MARIJUANA: NEGATIVE ng/mL (ref <2.5)
MDMA: NEGATIVE ng/mL (ref <10)
Meprobamate: NEGATIVE ng/mL (ref <2.5)
Methadone: NEGATIVE ng/mL (ref <5.0)
Morphine: NEGATIVE ng/mL (ref <2.5)
Nicotine Metabolite: NEGATIVE ng/mL (ref <5.0)
Norhydrocodone: NEGATIVE ng/mL (ref <2.5)
Noroxycodone: 12.3 ng/mL — ABNORMAL HIGH (ref <2.5)
Opiates: POSITIVE ng/mL — AB (ref <2.5)
Oxycodone: 194.6 ng/mL — ABNORMAL HIGH (ref <2.5)
Oxymorphone: NEGATIVE ng/mL (ref <2.5)
Phencyclidine: NEGATIVE ng/mL (ref <10)
Tapentadol: NEGATIVE ng/mL (ref <5.0)
Tramadol: NEGATIVE ng/mL (ref <5.0)
Zolpidem: NEGATIVE ng/mL (ref <5.0)

## 2024-05-09 LAB — DRUG TOX ALC METAB W/CON, ORAL FLD: Alcohol Metabolite: NEGATIVE ng/mL (ref <25)

## 2024-05-16 ENCOUNTER — Ambulatory Visit: Admitting: Family

## 2024-05-16 ENCOUNTER — Encounter: Payer: Self-pay | Admitting: Family

## 2024-05-16 VITALS — BP 134/83 | HR 73 | Temp 97.2°F | Ht 63.5 in | Wt 134.5 lb

## 2024-05-16 DIAGNOSIS — M5442 Lumbago with sciatica, left side: Secondary | ICD-10-CM

## 2024-05-16 DIAGNOSIS — M5441 Lumbago with sciatica, right side: Secondary | ICD-10-CM

## 2024-05-16 DIAGNOSIS — G8929 Other chronic pain: Secondary | ICD-10-CM

## 2024-05-16 DIAGNOSIS — R5382 Chronic fatigue, unspecified: Secondary | ICD-10-CM

## 2024-05-16 DIAGNOSIS — R252 Cramp and spasm: Secondary | ICD-10-CM

## 2024-05-16 DIAGNOSIS — Z13220 Encounter for screening for lipoid disorders: Secondary | ICD-10-CM

## 2024-05-16 LAB — COMPREHENSIVE METABOLIC PANEL WITH GFR
ALT: 16 U/L (ref 0–35)
AST: 18 U/L (ref 0–37)
Albumin: 4.4 g/dL (ref 3.5–5.2)
Alkaline Phosphatase: 59 U/L (ref 39–117)
BUN: 22 mg/dL (ref 6–23)
CO2: 31 meq/L (ref 19–32)
Calcium: 9.9 mg/dL (ref 8.4–10.5)
Chloride: 103 meq/L (ref 96–112)
Creatinine, Ser: 0.78 mg/dL (ref 0.40–1.20)
GFR: 76.81 mL/min (ref >60.00)
Glucose, Bld: 76 mg/dL (ref 70–99)
Potassium: 4.7 meq/L (ref 3.5–5.1)
Sodium: 138 meq/L (ref 135–145)
Total Bilirubin: 0.3 mg/dL (ref 0.2–1.2)
Total Protein: 7.3 g/dL (ref 6.0–8.3)

## 2024-05-16 LAB — LIPID PANEL
Cholesterol: 229 mg/dL — ABNORMAL HIGH (ref 0–200)
HDL: 50.2 mg/dL (ref >39.00)
LDL Cholesterol: 129 mg/dL — ABNORMAL HIGH (ref 0–99)
NonHDL: 178.38
Total CHOL/HDL Ratio: 5
Triglycerides: 246 mg/dL — ABNORMAL HIGH (ref 0.0–149.0)
VLDL: 49.2 mg/dL — ABNORMAL HIGH (ref 0.0–40.0)

## 2024-05-16 LAB — CBC WITH DIFFERENTIAL/PLATELET
Basophils Absolute: 0 K/uL (ref 0.0–0.1)
Basophils Relative: 0.6 % (ref 0.0–3.0)
Eosinophils Absolute: 0.1 K/uL (ref 0.0–0.7)
Eosinophils Relative: 1.8 % (ref 0.0–5.0)
HCT: 41.4 % (ref 36.0–46.0)
Hemoglobin: 13.7 g/dL (ref 12.0–15.0)
Lymphocytes Relative: 39.2 % (ref 12.0–46.0)
Lymphs Abs: 2.6 K/uL (ref 0.7–4.0)
MCHC: 33.2 g/dL (ref 30.0–36.0)
MCV: 95.1 fl (ref 78.0–100.0)
Monocytes Absolute: 0.5 K/uL (ref 0.1–1.0)
Monocytes Relative: 8 % (ref 3.0–12.0)
Neutro Abs: 3.4 K/uL (ref 1.4–7.7)
Neutrophils Relative %: 50.4 % (ref 43.0–77.0)
Platelets: 338 K/uL (ref 150.0–400.0)
RBC: 4.35 Mil/uL (ref 3.87–5.11)
RDW: 13.5 % (ref 11.5–15.5)
WBC: 6.7 K/uL (ref 4.0–10.5)

## 2024-05-16 LAB — MAGNESIUM: Magnesium: 2.5 mg/dL (ref 1.5–2.5)

## 2024-05-16 LAB — FERRITIN: Ferritin: 34.3 ng/mL (ref 10.0–291.0)

## 2024-05-16 MED ORDER — METHOCARBAMOL 500 MG PO TABS: 500.0000 mg | ORAL_TABLET | Freq: Four times a day (QID) | ORAL | 5 refills | Status: AC | PRN

## 2024-05-16 NOTE — Patient Instructions (Addendum)
 It was very nice to see you today!  I will review your lab results via MyChart in a few days.  Talk with Rheumatology about your leg pain, the reduced Pamelor  dose possibly contributing? The Oxycodone  can contribute to your fatigue.  Other options?  Continue the Robaxin , have sent in the refill. Be sure you are drinking at least 2 liters of caffeine-free beverages daily to keep your energy up! Avoid processed foods and white carbs. Eat whole fruit or juice, more protein, up to 80grams daily. Keep exercising daily, stop the exercises that seem to make your leg pain worse (like water  walking).  Look for over the counter Turmeric (Curcumin) with Bioperine (1500-2,000mg /day in divided doses) is an anti-inflammatory to help with arthritis pain/stiffness. You can try Black Cohosh , 400-500mg  3 times per day for hot flashes & night sweats. For any over the counter supplement or vitamin, look for organic or has a 3rd party seal from [employer], [employer] or [employer].  This authenticates the quality but not the efficacy (since not FDA approved).

## 2024-05-16 NOTE — Progress Notes (Unsigned)
 Patient ID: Sharon Logan, female    DOB: 04/20/1956, 68 y.o.   MRN: [id number]  Chief Complaint  Patient presents with  . Fatigue    Pt c/o fatigue, Present for a couple of months and worsening.   . leg cramping    Pt c/o bilateral leg pain/cramping, Present for 6 months and worsening. Has tried Robaxin , which does help sx.   Discussed the use of AI scribe software for clinical note transcription with the patient, who gave verbal consent to proceed.  History of Present Illness Sharon Logan is a 68 year old female who presents with chronic leg pain and fatigue.  She describes chronic leg pain and fatigue with cramping and tingling from the knee down, mainly on the right, described as pinpricks. The pain is also affecting the side muscles of her thighs. Cramping occurs during and after water  walking, which she has done for two months without improvement. Walking her dog and stationary biking cause leg pain without cramping. Certain gym exercises, especially hamstring work, trigger more cramping, but other exercises feel good to her. She takes oxycodone  and nortriptyline  for pain. She recently decreased nortriptyline  from 75 mg twice daily to just qhs, then 50 mg at bedtime due to dry mouth, which improved somewhat, and she agrees this change may relate to worsening leg pain. She takes methocarbamol  1.5 tablets twice daily, which helps her back pain. She has migraines treated effectively with Botox injections every three to four months. She also uses Maxalt  for migraine attacks. Ubrelvy  works best, but is not covered by her insurance. She takes three magnesium  pills at night for constipation and drinks about two liters of water  daily. She has not had recent labs to check potassium or magnesium . She often feels cold and has daily fatigue. She usually exercises around 11 AM. She avoids junk food and focuses on protein and fiber.  Assessment & Plan Leg cramping and pain Chronic leg cramping and  pain worsened by water  walking and certain exercises. Tingling from knee down. Magnesium  can be helpful for muscle recovery and night cramps. Potassium levels not recently checked. - Avoid water  walking. - Continue magnesium  supplementation, add one pill during the day for daytime cramping. - Ordered lab tests for potassium and magnesium  levels, CMP. - Continue exercise regimen that does not worsen symptoms.  Chronic low back pain with sciatica Chronic low back pain with sciatica into bilateral legs managed with methocarbamol . Current dosage effective. Reduced Pamelor  dose may be contributing. - Continue methocarbamol  1.5 tablets twice a day. - Refilled methocarbamol  prescription. - Continue daily exercises, avoiding water  walking. - Discuss with Rheum pain and switching out OXY d/t fatigue and hyperhidrosis, discuss other med options.  Fatigue (hx of fibromyalgia) Persistent fatigue possibly related to medication. Reports increased irritability also. Reduced Pamelor  dose may contribute to both. Fatigue affects daily activities, more pronounced in the morning. - Discuss with rheumatology leg pain & fatigue, possibly med related. - Consider alternative antidepressants with psychiatrist. - Ensure proper daily hydration. - Maintain current dietary habits, focus on whole foods, avoid processed sugars and white carbs.  General Health Maintenance Advised on managing cold intolerance and maintaining warmth. Discussed black cohosh  for night sweats/hot flashes and cold intolerance. - Consider black cohosh  for night sweats and cold intolerance. - Use layers and thermal wear to maintain warmth, drink hot liquids. - Consider warming gloves and hand warmers for cold intolerance.  Subjective:    Outpatient Medications Prior to Visit  Medication Sig  Dispense Refill  . alendronate (FOSAMAX) 70 MG tablet Take 70 mg by mouth once a week.    . amLODipine  (NORVASC ) 5 MG tablet Take 1 tablet (5 mg total)  by mouth daily. 90 tablet 1  . BIOTIN PO Take by mouth.    . cetirizine (ZYRTEC) 10 MG tablet Take 10 mg by mouth.    . Cholecalciferol  (VITAMIN D3) 25 MCG (1000 UT) CAPS Take by mouth.    . Cyanocobalamin  (VITAMIN B12 PO) Take by mouth.    . EPINEPHrine  0.3 mg/0.3 mL IJ SOAJ injection Inject 0.3 mg into the muscle as needed for anaphylaxis.    . Eszopiclone  3 MG TABS Take 1 tablet (3 mg total) by mouth at bedtime as needed. Take immediately before bedtime 30 tablet 5  . famotidine (PEPCID) 40 MG tablet Take 40 mg by mouth every evening.    . fluticasone  (FLONASE ) 50 MCG/ACT nasal spray Place 2 sprays into both nostrils 2 (two) times daily as needed for allergies or rhinitis.    . lidocaine  (LIDODERM ) 5 % Place 1 patch onto the skin daily.    . magnesium  gluconate (MAGONATE) 500 MG tablet Take 500 mg by mouth 2 (two) times daily.    . methocarbamol  (ROBAXIN ) 500 MG tablet TAKE 1 TABLET BY MOUTH EVERY 6 HOURS AS NEEDED FOR MUSCLE SPASMS. 90 tablet 3  . nortriptyline  (PAMELOR ) 75 MG capsule TAKE 1 CAPSULE BY MOUTH TWICE A DAY 60 capsule 1  . OVER THE COUNTER MEDICATION Bone health(calcium) - [employer]    . oxyCODONE -acetaminophen  (PERCOCET) 10-325 MG tablet Take 1 tablet by mouth every 6 (six) hours as needed for pain. 120 tablet 0  . saccharomyces boulardii (FLORASTOR) 250 MG capsule Take 250 mg by mouth daily.    SABRA UNABLE TO FIND CPAP    . Black Cohosh  200 MG CAPS Take 1 capsule (200 mg total) by mouth 3 (three) times daily. 90 capsule 0  . Galcanezumab -gnlm (EMGALITY ) 120 MG/ML SOAJ Inject 120 mg into the skin every 28 (twenty-eight) days. 1.12 mL 5   No facility-administered medications prior to visit.   Past Medical History:  Diagnosis Date  . Allergic rhinitis   . Anxiety   . Arthritis   . Cancer (HCC)    skin  . Chronic pain   . Chronic sinusitis 07/27/2018  . Chronically dry eyes   . Depression   . Fibromyalgia   . GERD (gastroesophageal reflux disease)   . Headache   .  Osteoporosis   . Overweight with body mass index (BMI) 25.0-29.9 07/22/2016  . Radiculopathy 06/29/2019  . Sleep apnea   . Stroke [facility])    from gabapenting per pt no deficits  . Vitamin D  deficiency   . Wears glasses    Past Surgical History:  Procedure Laterality Date  . ABDOMINAL HYSTERECTOMY    . BACK SURGERY    . BUNIONECTOMY Bilateral   . EYE SURGERY     laser surgery  . NASAL SINUS SURGERY     x2  . REVERSE SHOULDER ARTHROPLASTY Right 07/03/2022   Procedure: REVERSE SHOULDER ARTHROPLASTY;  Surgeon: Kay Kemps, MD;  Location: [facility];  Service: Orthopedics;  Laterality: Right;  120 min choice with interscalene block  . SACROILIAC JOINT FUSION Bilateral 09/12/2020   Procedure: REMOVAL OF BILATERAL PELVIC SCREWS;  Surgeon: Beuford Anes, MD;  Location: [facility] OR;  Service: Orthopedics;  Laterality: Bilateral;  . SKIN CANCER EXCISION     face x3  . TUBAL LIGATION  Allergies  Allergen Reactions  . Amlodipine  Other (See Comments)    headaches  Other Reaction(s): Not available  amlodipine   . Gabapentin Other (See Comments)    Delirium, stroke like symptoms  Other Reaction(s): Myalgia, Other  Has stroke symptoms when taking this with percocet.     Other reaction(s): Delirium    Delirium, stroke like symptoms  Other Reaction(s): Unknown  . Nortriptyline  Other (See Comments)    nortriptyline   . Polymyxin B Other (See Comments), Dermatitis and Hives    Eyes go blood red  Other Reaction(s): Redness  Other reaction(s): Other (See Comments), Red eye    Eyes go blood red    Other Reaction(s): Unknown  Made eyes red  Other Reaction(s): Not available, Redness, Unknown  polymyxin B  polymyxin B sulfate  Other reaction(s): Other (See Comments), Red eye    Eyes go blood red    Other Reaction(s): Unknown  Made eyes red    Eyes go blood red  Other Reaction(s): Redness  Other reaction(s): Other (See Comments), Red eye  Eyes go blood red  Other Reaction(s):  Unknown  Made eyes red  . Pregabalin  Itching, Rash, Anxiety and Dermatitis    Itchy red rash on chest  Other Reaction(s): other  Other Reaction(s): Unknown  Other Reaction(s): itchy red rash on chest  pregabalin   . Zolpidem Rash, Dermatitis and Other (See Comments)    sleep walk  Other Reaction(s): Other (See Comments)  zolpidem  sleep walk    sleep walk  Other Reaction(s): Other (See Comments)  . Amitriptyline  Other (See Comments)    Other Reaction(s): Not available, Unknown  amitriptyline   . Lamotrigine Rash, Dermatitis and Other (See Comments)  . Oxcarbazepine Other (See Comments)    Other Reaction(s): Other (See Comments)  . Oxycodone -Acetaminophen      Other Reaction(s): Other (See Comments)  Had stroke symptoms mixed with gabapentin.  Had stroke symptoms mixed with gabapentin.     Had stroke symptoms mixed with gabapentin.  . Oxycodone      Other Reaction(s): Had stroke symptoms  mixed with gabapentin  . Sumatriptan  Other (See Comments)    Other Reaction(s): Fatigue  Other Reaction(s): fatigue, Not available  sumatriptan   . Cymbalta  [Duloxetine  Hcl] Other (See Comments)    Headaches, constipation  . Duloxetine  Other (See Comments)    HEADACHES  CONSTIPATION  Other Reaction(s): Other (See Comments)  Per patient causes headache and constipation  Other Reaction(s): constipation and headache  duloxetine   . Fluoxetine Other (See Comments)    CONSTIPATION  Other Reaction(s): Other (See Comments)  Per patient constipation  Other Reaction(s): constipation  fluoxetine  . Other Other (See Comments)    OTOBIOTIC > RED EYES      Objective:    Physical Exam Vitals and nursing note reviewed.  Constitutional:      Appearance: Normal appearance.  Cardiovascular:     Rate and Rhythm: Normal rate and regular rhythm.  Pulmonary:     Effort: Pulmonary effort is normal.     Breath sounds: Normal breath sounds.  Musculoskeletal:         General: Normal range of motion.  Skin:    General: Skin is warm and dry.  Neurological:     Mental Status: She is alert.  Psychiatric:        Mood and Affect: Mood normal.        Behavior: Behavior normal.    BP 134/83 (BP Location: Left Arm, Patient Position: Sitting, Cuff Size: Normal)   Pulse 73  Temp (!) 97.2 F (36.2 C) (Temporal)   Ht 5' 3.5 (1.613 m)   Wt 134 lb 8 oz (61 kg)   SpO2 93%   BMI 23.45 kg/m  Wt Readings from Last 3 Encounters:  05/16/24 134 lb 8 oz (61 kg)  05/05/24 136 lb (61.7 kg)  04/07/24 137 lb (62.1 kg)       Lucius Krabbe, NP

## 2024-05-18 ENCOUNTER — Encounter: Payer: Self-pay | Admitting: Physician Assistant

## 2024-05-18 ENCOUNTER — Ambulatory Visit: Admitting: Physician Assistant

## 2024-05-18 ENCOUNTER — Ambulatory Visit: Payer: Self-pay | Admitting: Family

## 2024-05-18 DIAGNOSIS — F329 Major depressive disorder, single episode, unspecified: Secondary | ICD-10-CM

## 2024-05-18 DIAGNOSIS — G4700 Insomnia, unspecified: Secondary | ICD-10-CM

## 2024-05-18 DIAGNOSIS — F411 Generalized anxiety disorder: Secondary | ICD-10-CM

## 2024-05-18 DIAGNOSIS — F4310 Post-traumatic stress disorder, unspecified: Secondary | ICD-10-CM | POA: Diagnosis not present

## 2024-05-18 MED ORDER — SERTRALINE HCL 50 MG PO TABS: ORAL_TABLET | ORAL | 1 refills | Status: DC

## 2024-05-18 NOTE — Progress Notes (Unsigned)
 [facility] Check  Patient ID: Sharon Logan,  MRN: [id number]  PCP: Lucius Krabbe, NP  Date of Evaluation: 05/18/2024 Time spent:20 minutes  Chief Complaint:  Chief Complaint   Depression; Follow-up    HISTORY/CURRENT STATUS: HPI  For routine med check.  Not waking up in the middle of the night with dry mouth since we decreased the Nortriptyline . Still feels depressed though. Hard time enjoying things, partly d/t chronic pain. She asks about retrying Zoloft , which she thinks helped in the past. Feels sad but no feelings of hopelessness.  Sleeps fairly well.  ADLs and personal hygiene are normal.   Denies memory changes.  Appetite has not changed.  Weight is stable.  No c/o anxiety.  No mania, delirium, AH/VH.  No SI/HI.  Patient denies increased energy with decreased need for sleep, increased talkativeness, racing thoughts, impulsivity or risky behaviors, increased spending, increased libido, grandiosity, increased irritability or anger, paranoia, or hallucinations.  Individual Medical History/ Review of Systems: Changes? :No     Past Medication Trials: Cymbalta - Started after back surgery. Has made her tired, even when reduced to 30 mg. Was having to take 2 hour naps. May have helped slightly with pain. Had HA, Constipation, Nausea Savella- muscle aches, rhabdomyolysis Effexor-HA, helped with excessive sweating.  Pristiq - May have increased depression. Prozac- Constipation. Causes her to feel cold.  Celexa - Worsening depression at 20 mg dose Lexapro - My anti-depressant of choice and gives her energy. Was taking 2-3 years prior to surgery.  Zoloft   Paxil Viibryd - Felt depression was more heavy. Wellbutrin - Did not cause HA's. Tolerated well and was effective for depression. Auvelity - felt drugged and was forgetting appointments.  Amitriptyline - Did not cause HA's. Weight gain. Effective. Caused dry mouth. Nortriptyline - Did not cause HA's. Effective. Lamictal-  Rash Gabapentin -Rhabomyolysis Lyrica - Adverse reaction Buspar - Helpful for anxiety in the past. Well-tolerated. Tried again and had severe anxiety.  Trazodone - helped with sleep initiation but sleep was not restful. Severe dry mouth. Ambien- Parasomnia, sleep walking Lunesta - Tolerated, effective at 2 mg dose Remeron - Dry eyes, internal restlessness. Stomach discomfort.  Clonidine  Topamax - ineffective for headaches. Caused cognitive side effects.  Hydroxyzine- Initially helped with anxiety and sleep initiation, then no longer as helpful  Allergies: Amlodipine , Gabapentin, Nortriptyline , Polymyxin b, Pregabalin , Zolpidem, Amitriptyline , Lamotrigine, Oxcarbazepine, Oxycodone -acetaminophen , Oxycodone , Sumatriptan , Cymbalta  [duloxetine  hcl], Duloxetine , Fluoxetine, and Other  Current Medications:  Current Outpatient Medications:    alendronate (FOSAMAX) 70 MG tablet, Take 70 mg by mouth once a week., Disp: , Rfl:    amLODipine  (NORVASC ) 5 MG tablet, Take 1 tablet (5 mg total) by mouth daily., Disp: 90 tablet, Rfl: 1   BIOTIN PO, Take by mouth., Disp: , Rfl:    Black Cohosh  200 MG CAPS, Take 1 capsule (200 mg total) by mouth 3 (three) times daily., Disp: 90 capsule, Rfl: 0   cetirizine (ZYRTEC) 10 MG tablet, Take 10 mg by mouth., Disp: , Rfl:    Cholecalciferol  (VITAMIN D3) 25 MCG (1000 UT) CAPS, Take by mouth., Disp: , Rfl:    Cyanocobalamin  (VITAMIN B12 PO), Take by mouth., Disp: , Rfl:    EPINEPHrine  0.3 mg/0.3 mL IJ SOAJ injection, Inject 0.3 mg into the muscle as needed for anaphylaxis., Disp: , Rfl:    Eszopiclone  3 MG TABS, Take 1 tablet (3 mg total) by mouth at bedtime as needed. Take immediately before bedtime, Disp: 30 tablet, Rfl: 5   famotidine (PEPCID) 40 MG tablet, Take 40 mg by mouth every evening., Disp: , Rfl:  fluticasone  (FLONASE ) 50 MCG/ACT nasal spray, Place 2 sprays into both nostrils 2 (two) times daily as needed for allergies or rhinitis., Disp: , Rfl:    lidocaine   (LIDODERM ) 5 %, Place 1 patch onto the skin daily., Disp: , Rfl:    magnesium  gluconate (MAGONATE) 500 MG tablet, Take 500 mg by mouth at bedtime., Disp: , Rfl:    methocarbamol  (ROBAXIN ) 500 MG tablet, Take 1 tablet (500 mg total) by mouth every 6 (six) hours as needed for muscle spasms., Disp: 90 tablet, Rfl: 5   OVER THE COUNTER MEDICATION, Bone health(calcium) - [employer], Disp: , Rfl:    oxyCODONE -acetaminophen  (PERCOCET) 10-325 MG tablet, Take 1 tablet by mouth every 6 (six) hours as needed for pain., Disp: 120 tablet, Rfl: 0   saccharomyces boulardii (FLORASTOR) 250 MG capsule, Take 250 mg by mouth daily., Disp: , Rfl:    sertraline  (ZOLOFT ) 50 MG tablet, 1/2 daily for 2 weeks, then increase to 1 po every day., Disp: 30 tablet, Rfl: 1   UNABLE TO FIND, CPAP, Disp: , Rfl:  Medication Side Effects: dry mouth  Family Medical/ Social History: Changes? No  MENTAL HEALTH EXAM:  There were no vitals taken for this visit.There is no height or weight on file to calculate BMI.  General Appearance: Casual and Well Groomed  Eye Contact:  Good  Speech:  Clear and Coherent and Normal Rate  Volume:  Normal  Mood:  Euthymic  Affect:  Congruent  Thought Process:  Goal Directed and Descriptions of Associations: Circumstantial  Orientation:  Full (Time, Place, and Person)  Thought Content: Logical   Suicidal Thoughts:  No  Homicidal Thoughts:  No  Memory:  WNL  Judgement:  Good  Insight:  Good  Psychomotor Activity:  limps  Concentration:  Concentration: Good and Attention Span: Fair  Recall:  Good  Fund of Knowledge: Good  Language: Good  Assets:  Communication Skills Desire for Improvement Financial Resources/Insurance Housing Resilience Transportation Vocational/Educational  ADL's:  Intact  Cognition: WNL  Prognosis:  Good   EKG 02/17/2024 showed normal sinus rhythm possible anterior infarct, age undetermined, QT interval normal.  See results on chart.  DIAGNOSES:    ICD-10-CM   1.  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 PTSD (post-traumatic stress disorder)  F43.10     3. Insomnia, unspecified type  G47.00     4. Generalized anxiety disorder  F41.1       Receiving Psychotherapy: No   RECOMMENDATIONS:   PDMP reviewed. Lunesta  filled 04/17/2024.  Oxycodone  known to me. I provided approximately 20 minutes of face to face time during this encounter, including time spent before and after the visit in records review, medical decision making, counseling pertinent to today's visit, and charting.   Discussed options for depression. She'd like to retry Zoloft .  It's been awhile since she took it but thinks it helped.   Discontinue Nortriptyline .   Continue Lunesta  to 3 mg nightly as needed. Start Zoloft  50 mg, 1/2 daily for 2 weeks then increase to 1 po every day.  Continue vitamins as per med list. Continue CPAP machine. Recommend counseling.  Return in 6 weeks.  Verneita Cooks, PA-C

## 2024-05-24 ENCOUNTER — Ambulatory Visit: Admitting: Podiatry

## 2024-05-24 ENCOUNTER — Ambulatory Visit (INDEPENDENT_AMBULATORY_CARE_PROVIDER_SITE_OTHER)

## 2024-05-24 ENCOUNTER — Encounter: Payer: Self-pay | Admitting: Podiatry

## 2024-05-24 DIAGNOSIS — M2021 Hallux rigidus, right foot: Secondary | ICD-10-CM

## 2024-05-24 DIAGNOSIS — M2011 Hallux valgus (acquired), right foot: Secondary | ICD-10-CM | POA: Diagnosis not present

## 2024-05-24 DIAGNOSIS — M792 Neuralgia and neuritis, unspecified: Secondary | ICD-10-CM | POA: Diagnosis not present

## 2024-05-24 NOTE — Progress Notes (Signed)
 Subjective:  Patient ID: JOCABED CHEESE, female    DOB: 07-Jun-1956,   MRN: [id number]  Chief Complaint  Patient presents with   Foot Pain    A year ago, I had the bunion fixed. I have the hardware in it and my foot cramps day in and day out.  I have my last CT with me.  My request is can we remove the hardware?    68 y.o. female presents for concern of cramping in her right foot.  She has a significant history of issues on this right foot.  She has had previous bunion surgery years ago as well as implants and revisions arthrodesis of the first MPJ.  Most recently she had surgery about a year ago for revision arthrodesis following nonunion.  She has had bone stimulator.  She is here today to discuss other options.  She has her most recent CT which shows potential continued nonunion in the distal phalanx.  She is wondering about removal of the hardware.. Denies any other pedal complaints. Denies n/v/f/c.   Past Medical History:  Diagnosis Date   Allergic rhinitis    Anxiety    Arthritis    Cancer (HCC)    skin   Chronic pain    Chronic sinusitis 07/27/2018   Chronically dry eyes    Depression    Fibromyalgia    GERD (gastroesophageal reflux disease)    Headache    Osteoporosis    Overweight with body mass index (BMI) 25.0-29.9 07/22/2016   Radiculopathy 06/29/2019   Sleep apnea    Stroke (HCC)    from gabapenting per pt no deficits   Vitamin D  deficiency    Wears glasses     Objective:  Physical Exam: Vascular: DP/PT pulses 2/4 bilateral. CFT <3 seconds. Normal hair growth on digits. No edema.  Skin. No lacerations or abrasions bilateral feet.  Musculoskeletal: MMT 5/5 bilateral lower extremities in DF, PF, Inversion and Eversion. Deceased ROM in DF of ankle joint.  Tender over the dorsum of the foot on the right.  Pain over the first MPJ distally around the right hallux.  Also pain plantarly around the second metatarsal head and proximally to the second metatarsal base.  Also  relates some pain going up the ankle dorsal and anteriorly.   Neurological: Sensation intact to light touch.    CT right foot FINDINGS: Status post first MTP joint arthrodesis revision with placement of bone graft across the joint. The proximal aspect of the bone graft appears completely incorporated into the distal first metatarsal. However, there is incomplete incorporation/union of the distal aspect of the graft into the first proximal phalanx with persistent lucency along the course of the bone graft. Hardware is intact.   Assessment:   1. Morton's neuroma, right   2. Morton's neuroma, left       Plan:  Patient was evaluated and treated and all questions answered. Discussed first MPJ arthrodesis and nonunion as well as other etiologies for cramping with patient in detail.  Discussed treatment options. Reviewed notes from previous providers and imaging studies that she brought with her today. X-rays reviewed today.  No acute fractures or dislocations noted.  Hardware present and intact.  Noted fusion of the proximal aspect of graft.  Solid union noted proximally of the graft.  Possible nonunion noted to distal aspect of graft at least laterally. Discussed with patient cramping and cause of pain.  Discussed possibilities of nonunion causing the pain versus hardware versus likely nerve irritation  and issues with back. Discussed I do not believe at this time the hardware is the cause of her trouble and would advise on waiting until union and distal phalanx.  Do not feel like cramping is coming from the nonunion either at this time.  But advised on giving this more time and continuing to keep an eye on it continue with the bone stimulator and hopefully things will improve.  Discussed if after some time it is still causing trouble could consider removing hardware but do not know if this would be the solution. Did discuss nerve considerations and getting a nerve conduction study.  She has had 1  in the past and this did not show anything at that time. Discussed as well custom molded orthotics to offload the area and help with the cramping pain.  Fitted today for these. Return as needed.     Asberry Failing, DPM

## 2024-05-24 NOTE — Progress Notes (Signed)
 Visit:  ORTHOTIC SCAN/ EVALUATION  Patient presented for evaluation with Dr, Sikora-  while in the office, I did a scan for custom molded foot orthotics.  Patient will benefit from custom foot orthotics to provide total contact to bilateral medial longitudinal arches to help balance and distribute body weight more evenly.  Thus reducing plantar pressure and pain.   Orthotic will encourage forefoot and rearfoot alignment.    Patient was scanned today with [facility] scanner.    Orthotics are ordered.  Signature obtained for notification of pricing/ fees for the device.  When the orthotic is ready for pick up, will call to make an appointment for a fitting.

## 2024-06-02 ENCOUNTER — Encounter: Attending: Physical Medicine and Rehabilitation | Admitting: Physical Medicine and Rehabilitation

## 2024-06-02 ENCOUNTER — Encounter: Payer: Self-pay | Admitting: Physical Medicine and Rehabilitation

## 2024-06-02 VITALS — BP 124/76 | HR 61 | Ht 63.5 in | Wt 134.8 lb

## 2024-06-02 DIAGNOSIS — M48061 Spinal stenosis, lumbar region without neurogenic claudication: Secondary | ICD-10-CM | POA: Diagnosis present

## 2024-06-02 DIAGNOSIS — M7918 Myalgia, other site: Secondary | ICD-10-CM | POA: Diagnosis present

## 2024-06-02 DIAGNOSIS — G894 Chronic pain syndrome: Secondary | ICD-10-CM | POA: Insufficient documentation

## 2024-06-02 DIAGNOSIS — M532X7 Spinal instabilities, lumbosacral region: Secondary | ICD-10-CM | POA: Insufficient documentation

## 2024-06-02 MED ORDER — SODIUM CHLORIDE (PF) 0.9 % IJ SOLN
2.0000 mL | Freq: Once | INTRAMUSCULAR | Status: AC
  Administered 2024-06-02: 2 mL

## 2024-06-02 MED ORDER — OXYCODONE-ACETAMINOPHEN 10-325 MG PO TABS: 1.0000 | ORAL_TABLET | Freq: Four times a day (QID) | ORAL | 0 refills | Status: DC | PRN

## 2024-06-02 MED ORDER — LIDOCAINE HCL 1 % IJ SOLN
5.0000 mL | Freq: Once | INTRAMUSCULAR | Status: AC
  Administered 2024-06-02: 5 mL

## 2024-06-02 NOTE — Addendum Note (Signed)
 Addended by: LORILEE SVEN SQUIBB on: 06/02/2024 11:01 AM   Modules accepted: Orders, Level of Service

## 2024-06-02 NOTE — Progress Notes (Signed)

## 2024-06-02 NOTE — Progress Notes (Signed)
 "  Subjective:    Patient ID: Sharon Logan, female    DOB: 1956-03-21, 68 y.o.   MRN: [id number]  HPI  Mrs. Wiedel is a 68 year old woman who presents for follow-up of cervical myofascial pain syndrome, intolerance to cold.   -Muscles feel like dead weight- both arms and legs.   -going on for 4 years.  -found her husband's body- her husband had throat cancer and was doing radiation and chemo at the same time and suffered a heart attack. This was 14 years ago and may have been a trigger.  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   -she has been given mirtazepine as she also has anxiety   -she has been given hydrocodone  and she feels she has built up a tolerance  -she has never tried Topamax .   -she gets cold really fast and hurts twice as bad. She has even been getting cold in the summer and this is intolerable to her  -she loves to read  -she has tried a TENS unit before with benefit while she used it  -continues to have a lot of cervical myofascial pain and is ready to try trigger point injections today. She cannot recall if she has tried these before.   2) Neuropathy: -failed oxycodone  -present in calves -started since she has a SCS placed -she has told her SCS company's rep that that she wants the device removed but they have said they need an XR result -she would like to try Qutenza   Pain Inventory Average Pain 5 Pain Right Now 4 My pain is sharp, burning, stabbing, tingling and aching  In the last 24 hours, has pain interfered with the following? General activity 5 Relation with others 5 Enjoyment of life 5 What TIME of day is your pain at its worst? evening Sleep (in general) Poor  Pain is worse with: walking, bending and some activites Pain improves with: rest, heat/ice, therapy/exercise and injections Relief from Meds: 4  walk without assistance how many minutes can you walk? 40 ability to climb steps?  yes do you drive?   yes  disabled: date disabled Jan 2013 retired  numbness depression anxiety  New pt  New pt    Family History  Problem Relation Age of Onset   Depression Mother    Heart attack Mother    COPD Mother    Depression Father    Heart attack Father    ADD / ADHD Son    Social History   Socioeconomic History   Marital status: Widowed    Spouse name: Not on file   Number of children: 2   Years of education: Not on file   Highest education level: Not on file  Occupational History   Not on file  Tobacco Use   Smoking status: Never   Smokeless tobacco: Never  Vaping Use   Vaping status: Never Used  Substance and Sexual Activity   Alcohol  use: No   Drug use: No   Sexual activity: Not Currently    Birth control/protection: Surgical    Comment: Hysterectomy  Other Topics Concern   Not on file  Social History Narrative   Right handed   Lives in a one story home   Drinks Tea   Social Drivers of Health   Tobacco Use: Low Risk (06/02/2024)   Patient History    Smoking Tobacco Use: Never    Smokeless Tobacco Use: Never  Passive Exposure: Not on file  Financial Resource Strain: Low Risk (11/24/2023)   Received from [facility]   Overall Financial Resource Strain (CARDIA)    Difficulty of Paying Living Expenses: Not hard at all  Food Insecurity: No Food Insecurity (11/24/2023)   Received from [facility]   Epic    Within the past 12 months, you worried that your food would run out before you got the money to buy more.: Never true    Within the past 12 months, the food you bought just didn't last and you didn't have money to get more.: Never true  Transportation Needs: No Transportation Needs (11/24/2023)   Received from [facility] - Transportation    Lack of Transportation (Medical): No    Lack of Transportation (Non-Medical): No  Physical Activity: Sufficiently Active (11/24/2023)   Received from [facility]   Exercise Vital Sign    On average,  how many days per week do you engage in moderate to strenuous exercise (like a brisk walk)?: 7 days    On average, how many minutes do you engage in exercise at this level?: 30 min  Stress: No Stress Concern Present (11/24/2023)   Received from [facility] Occupational [facility] - Occupational Stress Questionnaire    Feeling of Stress : Not at all  Social Connections: Socially Integrated (11/24/2023)   Received from [facility]   Social Network    How would you rate your social network (family, work, friends)?: Good participation with social networks  Depression (PHQ2-9): Medium Risk (05/16/2024)   Depression (PHQ2-9)    PHQ-2 Score: 8  Alcohol  Screen: Not on file  Housing: Low Risk (11/24/2023)   Received from [facility]    In the last 12 months, was there a time when you were not able to pay the mortgage or rent on time?: No    In the past 12 months, how many times have you moved where you were living?: 0    At any time in the past 12 months, were you homeless or living in a shelter (including now)?: No  Utilities: Not At Risk (11/24/2023)   Received from [facility] Utilities    Threatened with loss of utilities: No  Health Literacy: Not on file   Past Surgical History:  Procedure Laterality Date   ABDOMINAL HYSTERECTOMY     BACK SURGERY     BUNIONECTOMY Bilateral    EYE SURGERY     laser surgery   NASAL SINUS SURGERY     x2   REVERSE SHOULDER ARTHROPLASTY Right 07/03/2022   Procedure: REVERSE SHOULDER ARTHROPLASTY;  Surgeon: Kay Kemps, MD;  Location: [facility] ORS;  Service: Orthopedics;  Laterality: Right;  120 min choice with interscalene block   SACROILIAC JOINT FUSION Bilateral 09/12/2020   Procedure: REMOVAL OF BILATERAL PELVIC SCREWS;  Surgeon: Beuford Anes, MD;  Location: [facility] OR;  Service: Orthopedics;  Laterality: Bilateral;   SKIN CANCER EXCISION     face x3   TUBAL LIGATION     Past Medical History:  Diagnosis Date    Allergic rhinitis    Anxiety    Arthritis    Cancer (HCC)    skin   Chronic pain    Chronic sinusitis 07/27/2018   Chronically dry eyes    Depression    Fibromyalgia    GERD (gastroesophageal reflux disease)    Headache    Osteoporosis    Overweight  with body mass index (BMI) 25.0-29.9 07/22/2016   Radiculopathy 06/29/2019   Sleep apnea    Stroke (HCC)    from gabapenting per pt no deficits   Vitamin D  deficiency    Wears glasses    BP 124/76 (BP Location: Left Arm, Patient Position: Sitting, Cuff Size: Normal)   Pulse 61   Ht 5' 3.5 (1.613 m)   Wt 134 lb 12.8 oz (61.1 kg)   SpO2 97%   BMI 23.50 kg/m   Opioid Risk Score:   Fall Risk Score:  `1  Depression screen [facility] 2/9     05/16/2024   12:06 PM 04/04/2024   10:42 AM 12/15/2023   10:43 AM 11/16/2023   11:00 AM 08/12/2023   11:14 AM 02/18/2023   10:58 AM 10/26/2022   10:59 AM  Depression screen PHQ 2/9  Decreased Interest 1 0 1 1 1  0 0  Down, Depressed, Hopeless 1  1 1 1  0 0  PHQ - 2 Score 2 0 2 2 2  0 0  Altered sleeping 2  1      Tired, decreased energy 2  1      Change in appetite 1  0      Feeling bad or failure about yourself  1  0      Trouble concentrating 0        Moving slowly or fidgety/restless 0  0      Suicidal thoughts 0  0      PHQ-9 Score 8  4       Difficult doing work/chores Somewhat difficult  Somewhat difficult         Data saved with a previous flowsheet row definition    Review of Systems  Musculoskeletal:  Positive for myalgias.       Arm, leg, foot pain  Neurological:  Positive for numbness.  Psychiatric/Behavioral:  Positive for dysphoric mood. The patient is nervous/anxious.   All other systems reviewed and are negative.      Objective:   Physical Exam Gen: no distress, normal appearing HEENT: oral mucosa pink and moist, NCAT Cardio: Reg rate Chest: normal effort, normal rate of breathing Abd: soft, non-distended Ext: no edema Psych: pleasant, normal affect Skin:  intact Neuro: Alert and oriented x3 Musculoskeletal: Diffuse pain and tenderness in upper and lower extremities. +trigger point injections in bilateral trapezius muscles, decreased sensation in feet     Assessment & Plan:  Mrs Games is a 68 year old woman who presents to establish care for fibromyalgia:  1) Chronic Pain Syndrome secondary to fibromyalgia -Discussed current symptoms of pain and history of pain.  -Discussed benefits of exercise in reducing pain. -start topamax  25mg  HS, can uptitrate in 3 days if well tolerated.  -Discussed following foods that may reduce pain: 1) Ginger (especially studied for arthritis)- reduce leukotriene production to decrease inflammation 2) Blueberries- high in phytonutrients that decrease inflammation 3) Salmon- marine omega-3s reduce joint swelling and pain 4) Pumpkin seeds- reduce inflammation 5) dark chocolate- reduces inflammation 6) turmeric- reduces inflammation 7) tart cherries - reduce pain and stiffness 8) extra virgin olive oil - its compound olecanthal helps to block prostaglandins  9) chili peppers- can be eaten or applied topically via capsaicin  10) mint- helpful for headache, muscle aches, joint pain, and itching 11) garlic- reduces inflammation  Link to further information on diet for chronic pain: http://www.bray.com/  2. Insomnia: -Try to go outside near sunrise -Get exercise during the day.  -Discussed good sleep hygiene:  turning off all devices an hour before bedtime.  -Chamomile tea with dinner.  -Can consider over the counter melatonin  2) Anxiety: -Discussed exercise and meditation as tools to decrease anxiety. -Recommended Down Dog Yoga app -Discussed spending time outdoors. -Discussed positive re-framing of anxiety.  -Discussed the following foods that have been show to reduce anxiety: 1) Brazil nuts, mushrooms, soy beans due to their high  selenium content. Upper limit of toxicity of selenium is 465mcg/day so no more than 3-4 brazil nuts per day.  2) Fatty fish such as salmon, mackerel, sardines, trout, and herring- high in omega-3 fatty acids 3) Eggs- increases serotonin and dopamine 4) Pumpkin seeds- high in omega-3 fatty acids 5) dark chocolate- high in flavanols that increase blood flow to brain 6) turmeric- take with black pepper to increase absorption 7) chamomile tea- antioxidant and anti-inflammatory properties 8) yogurt without sugar- supports gut-brain axis 9) green tea- contains L- theanine 10) blueberries- high in vitamin C and antioxidants 11) turkey- high in tryptophan which gets converted to serotonin 12) bell peppers- rich in vitamin C and antioxidants 13) citrus fruits- rich in vitamin C and antioxidants 14) almonds- high in vitamin E and healthy fats 15) chia seeds- high in omega-3 fatty acids  3) Cervical myofascial pain syndrome Trigger Point Injection  Indication: Cervical myofascial pain not relieved by medication management and other conservative care.  Informed consent was obtained after describing risk and benefits of the procedure with the patient, this includes bleeding, bruising, infection and medication side effects.  The patient wishes to proceed and has given written consent.  The patient was placed in a seated position.  The cervical area was marked and prepped with Betadine .  It was entered with a 25-gauge 1/2 inch needle and a total of 5 mL of 1% lidocaine  was injected into a total of 4 trigger points, after negative draw back for blood.  The patient tolerated the procedure well.  Post procedure instructions were given.    -advised heat to relax muscles -can repeat in 3 months if needed -call me to repeat earlier if needed -discussed benefits of TENs unit, can try for her if trigger point injections not providing enough relief.  Prescribing Home Zynex NexWave Stimulator Device and supplies  as needed. IFC, NMES and TENS medically necessary Treatment Rx: Daily @ 30-40 minutes per treatment PRN. Zynex NexWave only, no substitutions. Treatment Goals: 1) To reduce and/or eliminate pain 2) To improve functional capacity and Activities of daily living 3) To reduce or prevent the need for oral medications 4) To improve circulation in the injured region 5) To decrease or prevent muscle spasm and muscle atrophy 6) To provide a self-management tool to the patient The patient has not sufficiently improved with conservative care. Numerous studies indexed by Medline and PubMed.gov have shown Neuromuscular, Interferential, and TENS stimulators to reduce pain, improve function, and reduce medication use in injured patients. Continued use of this evidence based, safe, drug free treatment is both reasonable and medically necessary at this time.   4) Cold intolerance -very bothersome to her -present even in summer -thyroid  low end of norma -prescribed levothyroxine  50mcg to see if this helps, advised her to take 30-60 minutes before breakfast and to please let me know  5) Low vitamin D  -continue current supplementation -advised regarding the benefits of high dose supplementation, can consider if symptoms fail to improve with above regimen. '  6) Neuropathy, bilateral feet -failed oxycodone , buprenorphine, cymbalta , lidocaine  patches, aleve, tylenol  -discussed that  there is no HgbA1c in our system but she has [facility]  -Discussed Qutenza  as an option for neuropathic pain control. Discussed that this is a capsaicin  patch, stronger than capsaicin  cream. Discussed that it is currently approved for diabetic peripheral neuropathy and post-herpetic neuralgia, but that it has also shown benefit in treating other forms of neuropathy. Provided patient with link to site to learn more about the patch: https://www.qutenza .com/. Discussed that the patch would be placed in office and  benefits usually last 3 months. Discussed that unintended exposure to capsaicin  can cause severe irritation of eyes, mucous membranes, respiratory tract, and skin, but that Qutenza  is a local treatment and does not have the systemic side effects of other nerve medications. Discussed that there may be pain, itching, erythema, and decreased sensory function associated with the application of Qutenza . Side effects usually subside within 1 week. A cold pack of analgesic medications can help with these side effects. Blood pressure can also be increased due to pain associated with administration of the patch.   -discussed her poor experience with SCS and that it has made her pain worse -discussed that she was started on Xtampza  and it does not help her -discussed that she wants to switch to oxycodone  TID prn -topamax  does not help.  -discussed the differences between Xtampza  and oxycodone  in terms of release into the bloodstream -discussed that she is not getting relief from Xtampza .   Prescribing Home Zynex NexWave Stimulator Device and supplies as needed. IFC, NMES and TENS medically necessary Treatment Rx: Daily @ 30-40 minutes per treatment PRN. Zynex NexWave only, no substitutions. Treatment Goals: 1) To reduce and/or eliminate pain 2) To improve functional capacity and Activities of daily living 3) To reduce or prevent the need for oral medications 4) To improve circulation in the injured region 5) To decrease or prevent muscle spasm and muscle atrophy 6) To provide a self-management tool to the patient The patient has not sufficiently improved with conservative care. Numerous studies indexed by Medline and PubMed.gov have shown Neuromuscular, Interferential, and TENS stimulators to reduce pain, improve function, and reduce medication use in injured patients. Continued use of this evidence based, safe, drug free treatment is both reasonable and medically necessary at this time.   -UDS and pain contract  performed today, discussed that if contains the expected metabolites, I will prescribe oxycodone  5mg  TID prn   7) Cervical radiculopathy -failed oxycodone , PT, compounded cream, buprenorphine, advil , aleve, tylenol , cymbalta , lidocaine  patches -reviewed her MRI results with her, shows foraminal stenosis and disc bulges  -Discussed Qutenza  as an option for neuropathic pain control. Discussed that this is a capsaicin  patch, stronger than capsaicin  cream. Discussed that it is currently approved for diabetic peripheral neuropathy and post-herpetic neuralgia, but that it has also shown benefit in treating other forms of neuropathy. Provided patient with link to site to learn more about the patch: https://www.qutenza .com/. Discussed that the patch would be placed in office and benefits usually last 3 months. Discussed that unintended exposure to capsaicin  can cause severe irritation of eyes, mucous membranes, respiratory tract, and skin, but that Qutenza  is a local treatment and does not have the systemic side effects of other nerve medications. Discussed that there may be pain, itching, erythema, and decreased sensory function associated with the application of Qutenza . Side effects usually subside within 1 week. A cold pack of analgesic medications can help with these side effects. Blood pressure can also be increased due to  pain associated with administration of the patch.   8) Low back and thoracic spine pain w/ spinal instability -TLSO ordered -would benefit from cryo-orthosis, ordered -refilled percocet -discussed that if percocet is not providing relief, would recommend weaning off since this medication has many side effects, discussed that if it provides relief sometimes she can continue on it -discussed Butrans patch as an option for additional relief  Will apply Qutenza  to bilateral feet for neuropathy and also cervical spine and arms for cervical radiculopathy "

## 2024-06-09 ENCOUNTER — Other Ambulatory Visit: Payer: Self-pay | Admitting: Physician Assistant

## 2024-06-12 NOTE — Progress Notes (Unsigned)
 "  NEUROLOGY FOLLOW UP OFFICE NOTE  MALAYJA FREUND [id number]  Assessment/Plan:   Pain/neuralgia - unclear etiology.  No evidence for symptoms based on MRI C-spine and NCV.  Lower extremity symptoms may be lumbar radicular, polyneuropathy or fibromyalgia. Migraine without aura, without status migrainosus, increased frequency.      NCV-EMG lower extremities Migraine prevention:  Emgality  Migraine rescue:  Ubrelvy  100mg .   Limit use of pain relievers to no more than 9 days out of the month to prevent risk of rebound or medication-overuse headache. Keep headache diary Follow up 6 months.   Total time spent in chart and face to face with patient:  40 minutes.    Subjective:  Jacqualynn L. Lords is a 68 year old right-handed female with fibromyalgia and depression who follows up for migraines and pain.   UPDATE: Migraines: Since onset of these symptoms, she has had increased frequency of her migraines as well.  Previously occurred 2-3 times a month.  They are now 5 to 7 times in the past 6 weeks.  She tried samples of Ubrelvy  and headaches last 2 hours (although Nurtec more effective).    Current NSAIDS/analgesics:  none Current triptans:  Maxalt  10mg  Current Antidepressant medications: Lexapro  Current CGRP inhibitor:  Emgality , Ubrelvy  100mg  Other therapy:  none  Pain/neuralgia: She had NCV-EMG of the lower extremities on 11/26/2023 which was normal.  She had several MRIs.  MRI of lumbar spine without contrast on 11/22/2023 showed chronic decompression and fusion L3-L4 through L5-S1 with degenerative changes at L5-S1 contributing to moderate right and mild left foraminal stenosis, no lower thoracic or lumbar spinal stenosis elsewhere, and demonstrated chronic sacral fractures, stable compared to prior MRI since 2023. She had MRI with and without contrast of the spine on 12/10/2023 to evaluate for arachnoiditis.  Cervical spine revealed mild cervical spondylosis with foraminal stenosis worst and  moderate at C3-4 on the right, but no moderate or high-grade spinal stenosis or abnormal cord signal or enhancement.  Thoracic spine revealed no high-grade canal or foraminal stenosis, abnormal thoracic spinal cord signal abnormality or abnormal enhancement.  Lumbar spine showed no findings to suggest arachnoiditis.  CT of thoracic and lumbar spine on 02/22/2024 showed osteopenia and mild for age thoracic spine degeneration, chronic decompression and fusion L3 through S1 with pedicle screws removed, solid-appearing posterior element arthrodesis L3 through the sacrum, and adjacent segment disease at L2-L3 with mild spinal stenosis.       HISTORY: Migraines: She has had migraines all my life but worse since menopause.  Weather always is a trigger.  Headaches have been worse.  Typical headaches are across the forehead.  They have changed over the past 3 to 4 months.  They last 2-3 hours with Maxalt  and occur about 15 days a month.  They are now bilateral retro-orbital aching pain.  There is associated with nausea, photophobia, phonophobia and in the past left greater than right arm numbness.  She now has associated dizziness, described as a spinning sensation in which she needs to hold onto something, lasting a couple of minutes.  It was originally 2 times a day and then 1 to 2 times a week.  It is not positional but occurs with activity such as playing with her dog on the floor or exercising such as yoga.  Dizziness causes problems with balance.  No double vision.  Sometimes dizziness occurs independent from the headaches.  She tried vestibular rehab.   Pain/Neuralgia: Currently takes methocarbamol  500mg  every 6 hours as  needed, oxycodone -acetaminophen   She has fibromyalgia, chronic neck pain with cervical radiculopathy and chronic low back pain and bilateral lumbar radiculopathy with known spondylosis and spinal stenosis of the lumbar spine s/p L4-5 fusion and later L3-S1 fusion revision.  X-ray of lumbar  spine on 06/03/2023 showed prior anterior posterior fusion at L3-4, L4-5 and L5-S1 as well as interval hardware removal at L3-S1 and placement of spinal stimulator.  MRI of cervical spine on 01/27/2023 showed mild for age noncompressive disc bulging at C2-3 through C7-T1 without significant spinal stenosis or neural impingement and moderate right C4 and mild right C5 foraminal stenosis due to disc bulge and uncovertebral disease.    In early April, she began experiencing burning pain from the elbows to the hands, sometimes into the middle 3 fingers (particularly the index), associated with itching and constant aching.  A couple of weeks later, she began noticing similar pain in her legs as well, mainly in the lateral legs but also the feet and sometimes the calves.  In addition, she notes cramping in her thighs and in the right foot.  Legs overall feel weaker.  She walks her dogs twice a day for 30-40 minutes at a time and has difficulty getting through the second walk due to pain, weakness and feeling short of breath.    X-ray of right hip/femur/knee on 11/05/2023 showed stable degenerative changes to both SI joints and chronic changes of the symphysis pubis region but no acute abnormalities.  Repeat MRI of cervical spine on 09/17/2023 showed mild cervical spondylosis and facet joint arthrosis without herniated disc, spinal stenosis or foraminal stenosis.  She had a NCV-EMG of the upper extremities on 10/27/2023 to evaluate bilateral upper extremity pain and paresthesias which showed just very mild median neuropathy at the left wrist but no evidience of cervical radiculopathy or generalized peripheral neuropathy.  She notices that when she takes Ubrelvy  for her migraines, it helps with the pain as well.  In addition, she began experiencing episodes of diaphoresis with sweating of her chest/torso and legs.  Happens at rest.  May have to change her shirt sometimes 5 times a day.   She has previously been followed  by surgery, pain management and neurology.  She has failed Cymbalta , gabapentin, Lyrica , Nucenta, baclofen , diclofenac , Celebrex, injections and surgery.   She is currently followed by pain management as well as physical medicine and rehab.   Past medications:  Past NSAIDS/analgesics:  Ibuprofen , naproxen, Excedrin, Fioricet, hydrocodone , Celebrex, diclofenac  Past abortive triptans:  Relpax , sumatriptan  Past abortive ergotamine:  none Past anti-emetic:  none  Past muscle relaxant:  baclofen  Past antihypertensive medications:  none Past antidepressant medications:  Nortriptyline , venlafaxine, duloxetine , mirtazapine , Lexapro  Past anticonvulsant medications:  Topiramate , Depakote, gabapentin, Lyrica  Past anti-CGRP:  none Other past therapies:  Botox (effective for 2 months and then wears off the last month)     Family history of headache:  Mom  PAST MEDICAL HISTORY: Past Medical History:  Diagnosis Date   Allergic rhinitis    Anxiety    Arthritis    Cancer (HCC)    skin   Chronic pain    Chronic sinusitis 07/27/2018   Chronically dry eyes    Depression    Fibromyalgia    GERD (gastroesophageal reflux disease)    Headache    Osteoporosis    Overweight with body mass index (BMI) 25.0-29.9 07/22/2016   Radiculopathy 06/29/2019   Sleep apnea    Stroke (HCC)    from gabapenting per pt no deficits  Vitamin D  deficiency    Wears glasses     MEDICATIONS: Current Outpatient Medications on File Prior to Visit  Medication Sig Dispense Refill   alendronate (FOSAMAX) 70 MG tablet Take 70 mg by mouth once a week.     amLODipine  (NORVASC ) 5 MG tablet Take 1 tablet (5 mg total) by mouth daily. 90 tablet 1   BIOTIN PO Take by mouth.     Black Cohosh  200 MG CAPS Take 1 capsule (200 mg total) by mouth 3 (three) times daily. 90 capsule 0   cetirizine (ZYRTEC) 10 MG tablet Take 10 mg by mouth.     Cholecalciferol  (VITAMIN D3) 25 MCG (1000 UT) CAPS Take by mouth.     Cyanocobalamin   (VITAMIN B12 PO) Take by mouth.     EPINEPHrine  0.3 mg/0.3 mL IJ SOAJ injection Inject 0.3 mg into the muscle as needed for anaphylaxis.     Eszopiclone  3 MG TABS Take 1 tablet (3 mg total) by mouth at bedtime as needed. Take immediately before bedtime 30 tablet 5   famotidine (PEPCID) 40 MG tablet Take 40 mg by mouth every evening.     fluticasone  (FLONASE ) 50 MCG/ACT nasal spray Place 2 sprays into both nostrils 2 (two) times daily as needed for allergies or rhinitis.     lidocaine  (LIDODERM ) 5 % Place 1 patch onto the skin daily.     magnesium  gluconate (MAGONATE) 500 MG tablet Take 500 mg by mouth at bedtime.     methocarbamol  (ROBAXIN ) 500 MG tablet Take 1 tablet (500 mg total) by mouth every 6 (six) hours as needed for muscle spasms. 90 tablet 5   OVER THE COUNTER MEDICATION Bone health(calcium) - [employer]     oxyCODONE -acetaminophen  (PERCOCET) 10-325 MG tablet Take 1 tablet by mouth every 6 (six) hours as needed for pain. 120 tablet 0   saccharomyces boulardii (FLORASTOR) 250 MG capsule Take 250 mg by mouth daily.     sertraline  (ZOLOFT ) 50 MG tablet TAKE 1 TABLET BY MOUTH EVERY DAY. 30 tablet 0   UNABLE TO FIND CPAP     No current facility-administered medications on file prior to visit.    ALLERGIES: Allergies  Allergen Reactions   Amlodipine  Other (See Comments)    headaches  Other Reaction(s): Not available  amlodipine    Gabapentin Other (See Comments)    Delirium, stroke like symptoms  Other Reaction(s): Myalgia, Other  Has stroke symptoms when taking this with percocet.     Other reaction(s): Delirium    Delirium, stroke like symptoms  Other Reaction(s): Unknown   Nortriptyline  Other (See Comments)    nortriptyline    Polymyxin B Other (See Comments), Dermatitis and Hives    Eyes go blood red  Other Reaction(s): Redness  Other reaction(s): Other (See Comments), Red eye    Eyes go blood red    Other Reaction(s): Unknown  Made eyes red  Other Reaction(s):  Not available, Redness, Unknown  polymyxin B  polymyxin B sulfate  Other reaction(s): Other (See Comments), Red eye    Eyes go blood red    Other Reaction(s): Unknown  Made eyes red    Eyes go blood red  Other Reaction(s): Redness  Other reaction(s): Other (See Comments), Red eye  Eyes go blood red  Other Reaction(s): Unknown  Made eyes red   Pregabalin  Itching, Rash, Anxiety and Dermatitis    Itchy red rash on chest  Other Reaction(s): other  Other Reaction(s): Unknown  Other Reaction(s): itchy red rash on chest  pregabalin   Zolpidem Rash, Dermatitis and Other (See Comments)    sleep walk  Other Reaction(s): Other (See Comments)  zolpidem  sleep walk    sleep walk  Other Reaction(s): Other (See Comments)   Amitriptyline  Other (See Comments)    Other Reaction(s): Not available, Unknown  amitriptyline    Lamotrigine Rash, Dermatitis and Other (See Comments)   Oxcarbazepine Other (See Comments)    Other Reaction(s): Other (See Comments)   Oxycodone -Acetaminophen      Other Reaction(s): Other (See Comments)  Had stroke symptoms mixed with gabapentin.  Had stroke symptoms mixed with gabapentin.     Had stroke symptoms mixed with gabapentin.   Oxycodone      Other Reaction(s): Had stroke symptoms  mixed with gabapentin   Sumatriptan  Other (See Comments)    Other Reaction(s): Fatigue  Other Reaction(s): fatigue, Not available  sumatriptan    Cymbalta  [Duloxetine  Hcl] Other (See Comments)    Headaches, constipation   Duloxetine  Other (See Comments)    HEADACHES  CONSTIPATION  Other Reaction(s): Other (See Comments)  Per patient causes headache and constipation  Other Reaction(s): constipation and headache  duloxetine    Fluoxetine Other (See Comments)    CONSTIPATION  Other Reaction(s): Other (See Comments)  Per patient constipation  Other Reaction(s): constipation  fluoxetine   Other Other (See Comments)    OTOBIOTIC > RED EYES     FAMILY HISTORY: Family History  Problem Relation Age of Onset   Depression Mother    Heart attack Mother    COPD Mother    Depression Father    Heart attack Father    ADD / ADHD Son       Objective:  *** General: No acute distress.  Patient appears well-groomed.   Head:  Normocephalic/atraumatic Neck:  Supple.  No paraspinal tenderness.  Full range of motion. Heart:  Regular rate and rhythm. Neuro:  Alert and oriented.  Speech fluent and not dysarthric.  Language intact.  CN II-XII intact.  Bulk and tone normal.  Muscle strength 5/5 throughout.  Sensation to light touch intact.  Deep tendon reflexes 2+ throughout, toes downgoing.  Gait normal.  Romberg negative.      Juliene Dunnings, DO  CC: Corean Comment, NP      "

## 2024-06-13 ENCOUNTER — Telehealth: Payer: Self-pay | Admitting: Podiatry

## 2024-06-13 ENCOUNTER — Telehealth: Payer: Self-pay

## 2024-06-13 ENCOUNTER — Ambulatory Visit (INDEPENDENT_AMBULATORY_CARE_PROVIDER_SITE_OTHER): Admitting: Neurology

## 2024-06-13 ENCOUNTER — Other Ambulatory Visit (HOSPITAL_COMMUNITY): Payer: Self-pay

## 2024-06-13 ENCOUNTER — Encounter: Payer: Self-pay | Admitting: Neurology

## 2024-06-13 ENCOUNTER — Telehealth: Payer: Self-pay | Admitting: Pharmacy Technician

## 2024-06-13 VITALS — BP 128/64 | HR 71 | Ht 63.5 in | Wt 134.0 lb

## 2024-06-13 DIAGNOSIS — G43009 Migraine without aura, not intractable, without status migrainosus: Secondary | ICD-10-CM

## 2024-06-13 DIAGNOSIS — G8929 Other chronic pain: Secondary | ICD-10-CM

## 2024-06-13 DIAGNOSIS — M5442 Lumbago with sciatica, left side: Secondary | ICD-10-CM

## 2024-06-13 DIAGNOSIS — M5441 Lumbago with sciatica, right side: Secondary | ICD-10-CM

## 2024-06-13 MED ORDER — EMGALITY 120 MG/ML ~~LOC~~ SOAJ: 120.0000 mg | SUBCUTANEOUS | 11 refills | Status: AC

## 2024-06-13 MED ORDER — UBRELVY 100 MG PO TABS: 1.0000 | ORAL_TABLET | ORAL | 11 refills | Status: AC | PRN

## 2024-06-13 NOTE — Telephone Encounter (Signed)
 PA has been submitted, and telephone encounter has been created. Please see telephone encounter dated 12.30.25.

## 2024-06-13 NOTE — Telephone Encounter (Signed)
 PA needed for Emgality 120 mg

## 2024-06-13 NOTE — Telephone Encounter (Signed)
 Hardware in the right foot is causing problems, and she would like to request an MRI. Does she need to make an appointment to do so, or can a referral be sent

## 2024-06-13 NOTE — Patient Instructions (Signed)
 Continue Emgality  every 4 weeks and Botox every 3 months. Ubrelvy  at earliest onset of migraine.  May repeat after 2 hours.  Maximum 2 tablets in 24 hours.

## 2024-06-13 NOTE — Telephone Encounter (Signed)
 Pharmacy Patient Advocate Encounter   Received notification from Pt Calls Messages that prior authorization for EMGALITY  120MG  is required/requested.   Insurance verification completed.   The patient is insured through [employer].   Per test claim: PA required; PA submitted to above mentioned insurance via Latent Key/confirmation #/EOC [id number] Status is pending

## 2024-06-14 ENCOUNTER — Telehealth: Payer: Self-pay | Admitting: Podiatry

## 2024-06-14 ENCOUNTER — Ambulatory Visit (INDEPENDENT_AMBULATORY_CARE_PROVIDER_SITE_OTHER): Admitting: Podiatry

## 2024-06-14 DIAGNOSIS — M792 Neuralgia and neuritis, unspecified: Secondary | ICD-10-CM | POA: Diagnosis not present

## 2024-06-14 DIAGNOSIS — M2021 Hallux rigidus, right foot: Secondary | ICD-10-CM

## 2024-06-14 MED ORDER — DEXAMETHASONE SODIUM PHOSPHATE 120 MG/30ML IJ SOLN
4.0000 mg | Freq: Once | INTRAMUSCULAR | Status: AC
  Administered 2024-06-14: 4 mg via INTRA_ARTICULAR

## 2024-06-14 MED ORDER — TRIAMCINOLONE ACETONIDE 10 MG/ML IJ SUSP
2.5000 mg | Freq: Once | INTRAMUSCULAR | Status: AC
  Administered 2024-06-14: 2.5 mg via INTRA_ARTICULAR

## 2024-06-14 NOTE — Telephone Encounter (Signed)
 Orthotics are in the [place] office. Left a message for Chenika to scheduled appointment to [facility]

## 2024-06-14 NOTE — Progress Notes (Signed)
 "  Subjective:  Patient ID: Sharon Logan, female    DOB: May 02, 1956,   MRN: [id number]  Chief Complaint  Patient presents with   [facility]    Patient present today for [facility] and to request MRI     68 y.o. female presents for concern of pain in her right foot.  She is here requesting to have hardware removed again and requesting to have MRI done.  She relates most of her pain is in her second toe and requesting to have the hardware and second toe removed.  She she relates constant pain daily at about a 7.  Relates some burning tingling and feelings of swelling in the foot.  Denies any other pedal complaints. Denies n/v/f/c.   Past Medical History:  Diagnosis Date   Allergic rhinitis    Anxiety    Arthritis    Cancer (HCC)    skin   Chronic pain    Chronic sinusitis 07/27/2018   Chronically dry eyes    Depression    Fibromyalgia    GERD (gastroesophageal reflux disease)    Headache    Osteoporosis    Overweight with body mass index (BMI) 25.0-29.9 07/22/2016   Radiculopathy 06/29/2019   Sleep apnea    Stroke (HCC)    from gabapenting per pt no deficits   Vitamin D  deficiency    Wears glasses     Objective:  Physical Exam: Vascular: DP/PT pulses 2/4 bilateral. CFT <3 seconds. Normal hair growth on digits. No edema.  Skin. No lacerations or abrasions bilateral feet.  Musculoskeletal: MMT 5/5 bilateral lower extremities in DF, PF, Inversion and Eversion. Deceased ROM in DF of ankle joint.  Tender over the dorsum of the foot on the right.  Pain over the first MPJ distally around the right hallux.  Also pain plantarly around the second metatarsal head and proximally to the second metatarsal base.  Also relates some pain going up the ankle dorsal and anteriorly.   Neurological: Sensation intact to light touch.    CT right foot FINDINGS: Status post first MTP joint arthrodesis revision with placement of bone graft across the joint. The proximal aspect of the bone graft appears completely  incorporated into the distal first metatarsal. However, there is incomplete incorporation/union of the distal aspect of the graft into the first proximal phalanx with persistent lucency along the course of the bone graft. Hardware is intact.   Assessment:   1. Neuritis   2. Hallux rigidus of right foot        Plan:  Patient was evaluated and treated and all questions answered. Discussed first MPJ arthrodesis and nonunion as well as other etiologies for cramping with patient in detail.  Discussed treatment options. Reviewed notes from previous providers and imaging studies that she brought with her today. X-rays reviewed today.  No acute fractures or dislocations noted.  Hardware present and intact.  Noted fusion of the proximal aspect of graft.  Solid union noted proximally of the graft.  Possible nonunion noted to distal aspect of graft at least laterally. Discussed with patient cramping and cause of pain.  Discussed possibilities of nonunion causing the pain versus hardware versus likely nerve irritation and issues with back. Discussed with patient again that she does not have any hardware in the second toe and the hardware in her first toe area is likely not causing any of her pain and would not recommend removal.  Discussed giving some more time to see if nonunion healed and could  consider MRI in another 6 months.  Did discuss again that likely most of this pain is nerve damage from multiple surgeries in this area and scar tissue. Did discuss nerve considerations and getting a nerve conduction study.  She has had 1 in the past and this did not show anything at that time.  Refuses at this time. Discussed trying a diagnostic steroid shot.  Patient amenable to this procedure below. Awaiting orthotics Return as needed.  Procedure: Injection Tendon/Ligament Discussed alternatives, risks, complications and verbal consent was obtained.  Location: Right second interspace. Skin Prep:  AlcoholAlcohol. Injectate: 1cc 0.5% marcaine  plain, 1 cc dexamethasone  0.5 cc kenalog   Disposition: Patient tolerated procedure well. Injection site dressed with a band-aid.  Post-injection care was discussed and return precautions discussed.       Asberry Failing, DPM    "

## 2024-06-14 NOTE — Telephone Encounter (Signed)
 Per secure chat, Dr. Joya want Pt to keep today's appt. Pt called and informed

## 2024-06-20 ENCOUNTER — Other Ambulatory Visit: Payer: Self-pay | Admitting: Family

## 2024-06-20 MED ORDER — ALENDRONATE SODIUM 70 MG PO TABS: 70.0000 mg | ORAL_TABLET | ORAL | 3 refills | Status: AC

## 2024-06-20 NOTE — Telephone Encounter (Signed)
 Copied from CRM [id number]. Topic: Clinical - Medication Refill >> Jun 20, 2024  9:54 AM Jayma L wrote: Medication: alendronate  (FOSAMAX ) 70 MG tablet  Has the patient contacted their pharmacy? Yes (Agent: If no, request that the patient contact the pharmacy for the refill. If patient does not wish to contact the pharmacy document the reason why and proceed with request.) (Agent: If yes, when and what did the pharmacy advise?)  This is the patient's preferred pharmacy:  [id number] [address] [facility] - [address] [place] [address] Phone: [phone] Fax: [phone]  Is this the correct pharmacy for this prescription? Yes If no, delete pharmacy and type the correct one.   Has the prescription been filled recently? No  Is the patient out of the medication? No  Has the patient been seen for an appointment in the last year OR does the patient have an upcoming appointment? Yes  Can we respond through MyChart? Yes  Agent: Please be advised that Rx refills may take up to 3 business days. We ask that you follow-up with your pharmacy.

## 2024-06-22 ENCOUNTER — Ambulatory Visit: Admitting: Family

## 2024-06-22 ENCOUNTER — Encounter: Payer: Self-pay | Admitting: Family

## 2024-06-22 VITALS — BP 123/80 | HR 63 | Temp 98.5°F | Ht 63.5 in | Wt 137.2 lb

## 2024-06-22 DIAGNOSIS — G6289 Other specified polyneuropathies: Secondary | ICD-10-CM

## 2024-06-22 DIAGNOSIS — M810 Age-related osteoporosis without current pathological fracture: Secondary | ICD-10-CM

## 2024-06-22 NOTE — Progress Notes (Signed)
 "  Patient ID: Sharon Logan, female    DOB: November 25, 1955, 69 y.o.   MRN: [id number]  Chief Complaint  Patient presents with   nerve pain    Patient c/o generalized nerve pain, pt would like a referral to neurology.   Discussed the use of AI scribe software for clinical note transcription with the patient, who gave verbal consent to proceed.  History of Present Illness Sharon Logan is a 69 year old female with neuropathy who presents with worsening neurogenic pain in her arms and legs.  She has severe neurogenic pain in both arms and legs down to her feet, which has worsened over the past three months. She denies falls. She has tried gabapentin, which she could not tolerate, and Lyrica , which she does not take. Amitriptyline  and nortriptyline  help at low doses but cause severe dry mouth at higher doses. She takes Fosamax  for bone health after Prolia  led to decreased bone mass. She has had no fractures.  Assessment & Plan Polyneuropathy Chronic polyneuropathy with worsening pain in arms and legs. Gabapentin and Lyrica  not tolerated. Amitriptyline  and nortriptyline  caused side effects at higher doses. No falls reported. Hx of cervical & lumbar DDD/stenosis, hx of epidural tx with limited success. Per review of EMR, EMG done on upper ext - + median wrist neuropathy unilateral, lower ext normal. Wanting 2nd opinion on possible treatment. - Referred to [facility] for 2nd opinion/further evaluation and management.  Osteoporosis Managed with Fosamax . Prolia  decreased bone density. No fractures, but history of broken ribs. Bone density test overdue. Need to confirm Fosamax  start date. - Ordered bone density test. - Continue Fosamax  75mg  qweek, refill sent.   Subjective:    Outpatient Medications Prior to Visit  Medication Sig Dispense Refill   amLODipine  (NORVASC ) 5 MG tablet Take 1 tablet (5 mg total) by mouth daily. 90 tablet 1   Black Cohosh  200 MG CAPS Take 1 capsule (200 mg total) by  mouth 3 (three) times daily. 90 capsule 0   cetirizine (ZYRTEC) 10 MG tablet Take 10 mg by mouth.     Cholecalciferol  (VITAMIN D3) 25 MCG (1000 UT) CAPS Take by mouth.     Cyanocobalamin  (VITAMIN B12 PO) Take by mouth.     EPINEPHrine  0.3 mg/0.3 mL IJ SOAJ injection Inject 0.3 mg into the muscle as needed for anaphylaxis.     Eszopiclone  3 MG TABS Take 1 tablet (3 mg total) by mouth at bedtime as needed. Take immediately before bedtime 30 tablet 5   famotidine (PEPCID) 40 MG tablet Take 40 mg by mouth every evening.     fluticasone  (FLONASE ) 50 MCG/ACT nasal spray Place 2 sprays into both nostrils 2 (two) times daily as needed for allergies or rhinitis.     Galcanezumab -gnlm (EMGALITY ) 120 MG/ML SOAJ Inject 120 mg into the skin every 30 (thirty) days. 1.12 mL 11   lidocaine  (LIDODERM ) 5 % Place 1 patch onto the skin daily.     magnesium  gluconate (MAGONATE) 500 MG tablet Take 500 mg by mouth at bedtime.     methocarbamol  (ROBAXIN ) 500 MG tablet Take 1 tablet (500 mg total) by mouth every 6 (six) hours as needed for muscle spasms. 90 tablet 5   OVER THE COUNTER MEDICATION Bone health(calcium) - [employer]     oxyCODONE -acetaminophen  (PERCOCET) 10-325 MG tablet Take 1 tablet by mouth every 6 (six) hours as needed for pain. 120 tablet 0   saccharomyces boulardii (FLORASTOR) 250 MG capsule Take 250 mg by mouth daily.  sertraline  (ZOLOFT ) 50 MG tablet TAKE 1 TABLET BY MOUTH EVERY DAY. 30 tablet 0   Ubrogepant  (UBRELVY ) 100 MG TABS Take 1 tablet (100 mg total) by mouth as needed. May repeat after 2 hours.  Maximum 2 tablets in 24 hours. 10 tablet 11   UNABLE TO FIND CPAP     alendronate  (FOSAMAX ) 70 MG tablet Take 1 tablet (70 mg total) by mouth once a week. 12 tablet 3   BIOTIN PO Take by mouth. (Patient not taking: Reported on 06/13/2024)     No facility-administered medications prior to visit.   Past Medical History:  Diagnosis Date   Allergic rhinitis    Anxiety    Arthritis    Cancer (HCC)     skin   Chronic pain    Chronic sinusitis 07/27/2018   Chronically dry eyes    Depression    Fibromyalgia    GERD (gastroesophageal reflux disease)    Headache    Osteoporosis    Overweight with body mass index (BMI) 25.0-29.9 07/22/2016   Radiculopathy 06/29/2019   Sleep apnea    Stroke (HCC)    from gabapenting per pt no deficits   Vitamin D  deficiency    Wears glasses    Past Surgical History:  Procedure Laterality Date   ABDOMINAL HYSTERECTOMY     BACK SURGERY     BUNIONECTOMY Bilateral    EYE SURGERY     laser surgery   NASAL SINUS SURGERY     x2   REVERSE SHOULDER ARTHROPLASTY Right 07/03/2022   Procedure: REVERSE SHOULDER ARTHROPLASTY;  Surgeon: Kay Kemps, MD;  Location: [facility];  Service: Orthopedics;  Laterality: Right;  120 min choice with interscalene block   SACROILIAC JOINT FUSION Bilateral 09/12/2020   Procedure: REMOVAL OF BILATERAL PELVIC SCREWS;  Surgeon: Beuford Anes, MD;  Location: [facility];  Service: Orthopedics;  Laterality: Bilateral;   SKIN CANCER EXCISION     face x3   TUBAL LIGATION     Allergies[1]    Objective:    Physical Exam BP 123/80 (BP Location: Left Arm, Patient Position: Sitting, Cuff Size: Normal)   Pulse 63   Temp 98.5 F (36.9 C) (Temporal)   Ht 5' 3.5 (1.613 m)   Wt 137 lb 3.2 oz (62.2 kg)   SpO2 98%   BMI 23.92 kg/m  Wt Readings from Last 3 Encounters:  06/22/24 137 lb 3.2 oz (62.2 kg)  06/13/24 134 lb (60.8 kg)  06/02/24 134 lb 12.8 oz (61.1 kg)       [name], NP     [1]  Allergies Allergen Reactions   Amlodipine  Other (See Comments)    headaches  Other Reaction(s): Not available  amlodipine    Gabapentin Other (See Comments)    Delirium, stroke like symptoms  Other Reaction(s): Myalgia, Other  Has stroke symptoms when taking this with percocet.     Other reaction(s): Delirium    Delirium, stroke like symptoms  Other Reaction(s): Unknown   Nortriptyline  Other (See Comments)     nortriptyline    Polymyxin B Other (See Comments), Dermatitis and Hives    Eyes go blood red  Other Reaction(s): Redness  Other reaction(s): Other (See Comments), Red eye    Eyes go blood red    Other Reaction(s): Unknown  Made eyes red  Other Reaction(s): Not available, Redness, Unknown  polymyxin B  polymyxin B sulfate  Other reaction(s): Other (See Comments), Red eye    Eyes go blood red    Other Reaction(s):  Unknown  Made eyes red    Eyes go blood red  Other Reaction(s): Redness  Other reaction(s): Other (See Comments), Red eye  Eyes go blood red  Other Reaction(s): Unknown  Made eyes red   Pregabalin  Itching, Rash, Anxiety and Dermatitis    Itchy red rash on chest  Other Reaction(s): other  Other Reaction(s): Unknown  Other Reaction(s): itchy red rash on chest  pregabalin    Zolpidem Rash, Dermatitis and Other (See Comments)    sleep walk  Other Reaction(s): Other (See Comments)  zolpidem  sleep walk    sleep walk  Other Reaction(s): Other (See Comments)   Amitriptyline  Other (See Comments)    Other Reaction(s): Not available, Unknown  amitriptyline    Lamotrigine Rash, Dermatitis and Other (See Comments)   Oxcarbazepine Other (See Comments)    Other Reaction(s): Other (See Comments)   Oxycodone -Acetaminophen      Other Reaction(s): Other (See Comments)  Had stroke symptoms mixed with gabapentin.  Had stroke symptoms mixed with gabapentin.     Had stroke symptoms mixed with gabapentin.   Oxycodone      Other Reaction(s): Had stroke symptoms  mixed with gabapentin   Sumatriptan  Other (See Comments)    Other Reaction(s): Fatigue  Other Reaction(s): fatigue, Not available  sumatriptan    Cymbalta  [Duloxetine  Hcl] Other (See Comments)    Headaches, constipation   Duloxetine  Other (See Comments)    HEADACHES  CONSTIPATION  Other Reaction(s): Other (See Comments)  Per patient causes headache and constipation  Other Reaction(s):  constipation and headache  duloxetine    Fluoxetine Other (See Comments)    CONSTIPATION  Other Reaction(s): Other (See Comments)  Per patient constipation  Other Reaction(s): constipation  fluoxetine   Other Other (See Comments)    OTOBIOTIC > RED EYES   "

## 2024-06-23 ENCOUNTER — Ambulatory Visit: Admitting: Podiatrist

## 2024-06-23 DIAGNOSIS — M2021 Hallux rigidus, right foot: Secondary | ICD-10-CM

## 2024-06-23 DIAGNOSIS — M2011 Hallux valgus (acquired), right foot: Secondary | ICD-10-CM

## 2024-06-23 NOTE — Progress Notes (Signed)
 ORTHOTIC DISPENSING:   Reason for Visit:         Fitting and Delivery of Custom Fabricated Foot Orthoses Patient Report:            Patient reports comfort and is satisfied with device.   OBJECTIVE DATA: Patient History / Diagnosis:    No change in pathology Provided Device:                     Functional foot orthoses   GOAL OF ORTHOSIS - Improve gait - Decrease energy expenditure - Improve Balance - Provide Triplanar stability of foot complex - Facilitate motion   ACTIONS PERFORMED Patient was fit with custom foot orthoses   Patient was provided with verbal and written instruction and demonstration regarding wear, care, proper fit, function, and use of the orthosis.    Patient was also provided with verbal instruction regarding how to report any failures or malfunctions of the orthosis and necessary follow up care. Patient was also instructed to contact our office regarding any change in status that may affect the function of the orthosis.   Patient demonstrated understanding of all instructions.  Sharon Logan, DPM

## 2024-06-30 ENCOUNTER — Encounter: Payer: Self-pay | Admitting: Registered Nurse

## 2024-06-30 ENCOUNTER — Ambulatory Visit: Admitting: Physician Assistant

## 2024-06-30 ENCOUNTER — Encounter: Attending: Physical Medicine and Rehabilitation | Admitting: Registered Nurse

## 2024-06-30 VITALS — BP 131/79 | HR 75 | Ht 63.5 in | Wt 134.0 lb

## 2024-06-30 DIAGNOSIS — G894 Chronic pain syndrome: Secondary | ICD-10-CM | POA: Insufficient documentation

## 2024-06-30 DIAGNOSIS — Z5181 Encounter for therapeutic drug level monitoring: Secondary | ICD-10-CM | POA: Insufficient documentation

## 2024-06-30 DIAGNOSIS — M5416 Radiculopathy, lumbar region: Secondary | ICD-10-CM | POA: Diagnosis not present

## 2024-06-30 DIAGNOSIS — M48061 Spinal stenosis, lumbar region without neurogenic claudication: Secondary | ICD-10-CM | POA: Diagnosis not present

## 2024-06-30 DIAGNOSIS — G8929 Other chronic pain: Secondary | ICD-10-CM | POA: Insufficient documentation

## 2024-06-30 DIAGNOSIS — M546 Pain in thoracic spine: Secondary | ICD-10-CM | POA: Insufficient documentation

## 2024-06-30 DIAGNOSIS — M797 Fibromyalgia: Secondary | ICD-10-CM | POA: Diagnosis not present

## 2024-06-30 DIAGNOSIS — Z79891 Long term (current) use of opiate analgesic: Secondary | ICD-10-CM | POA: Insufficient documentation

## 2024-06-30 MED ORDER — OXYCODONE-ACETAMINOPHEN 10-325 MG PO TABS: 1.0000 | ORAL_TABLET | Freq: Four times a day (QID) | ORAL | 0 refills | Status: AC | PRN

## 2024-06-30 NOTE — Progress Notes (Unsigned)
 "  Subjective:    Patient ID: Sharon Logan, female    DOB: Jul 01, 1955, 69 y.o.   MRN: [id number]  HPI: Sharon Logan is a 69 y.o. female who returns for follow up appointment for chronic pain and medication refill. states *** pain is located in  ***. rates pain ***. current exercise regime is walking and performing stretching exercises.  Sharon Logan Morphine  equivalent is *** MME.   Last Oral Swab was Performed on 05/05/2024,mit was consistent.     Pain Inventory Average Pain 7 Pain Right Now 7 My pain is sharp, burning, stabbing, and aching  In the last 24 hours, has pain interfered with the following? General activity 5 Relation with others 4 Enjoyment of life 6 What TIME of day is your pain at its worst? daytime Sleep (in general) Poor  Pain is worse with: walking and standing Pain improves with: rest, heat/ice, therapy/exercise, pacing activities, and medication Relief from Meds: Fair  Family History  Problem Relation Age of Onset   Depression Mother    Heart attack Mother    COPD Mother    Depression Father    Heart attack Father    ADD / ADHD Son    Social History   Socioeconomic History   Marital status: Widowed    Spouse name: Not on file   Number of children: 2   Years of education: Not on file   Highest education level: Not on file  Occupational History   Not on file  Tobacco Use   Smoking status: Never   Smokeless tobacco: Never  Vaping Use   Vaping status: Never Used  Substance and Sexual Activity   Alcohol  use: No   Drug use: No   Sexual activity: Not Currently    Birth control/protection: Surgical    Comment: Hysterectomy  Other Topics Concern   Not on file  Social History Narrative   Right handed   Lives in a one story home   Drinks Tea   Social Drivers of Health   Tobacco Use: Low Risk (06/30/2024)   Patient History    Smoking Tobacco Use: Never    Smokeless Tobacco Use: Never    Passive Exposure: Not on file  Financial Resource  Strain: Low Risk (11/24/2023)   Received from [facility]   Overall Financial Resource Strain (CARDIA)    Difficulty of Paying Living Expenses: Not hard at all  Food Insecurity: No Food Insecurity (11/24/2023)   Received from [facility]   Epic    Within the past 12 months, you worried that your food would run out before you got the money to buy more.: Never true    Within the past 12 months, the food you bought just didn't last and you didn't have money to get more.: Never true  Transportation Needs: No Transportation Needs (11/24/2023)   Received from [facility] - Transportation    Lack of Transportation (Medical): No    Lack of Transportation (Non-Medical): No  Physical Activity: Sufficiently Active (11/24/2023)   Received from [facility]   Exercise Vital Sign    On average, how many days per week do you engage in moderate to strenuous exercise (like a brisk walk)?: 7 days    On average, how many minutes do you engage in exercise at this level?: 30 min  Stress: No Stress Concern Present (11/24/2023)   Received from [facility] - Occupational Stress Questionnaire  Feeling of Stress : Not at all  Social Connections: Socially Integrated (11/24/2023)   Received from [facility]   Social Network    How would you rate your social network (family, work, friends)?: Good participation with social networks  Depression (PHQ2-9): Medium Risk (05/16/2024)   Depression (PHQ2-9)    PHQ-2 Score: 8  Alcohol  Screen: Not on file  Housing: Low Risk (11/24/2023)   Received from [facility]    In the last 12 months, was there a time when you were not able to pay the mortgage or rent on time?: No    In the past 12 months, how many times have you moved where you were living?: 0    At any time in the past 12 months, were you homeless or living in a shelter (including now)?: No  Utilities: Not At Risk (11/24/2023)   Received from  [facility] Utilities    Threatened with loss of utilities: No  Health Literacy: Not on file   Past Surgical History:  Procedure Laterality Date   ABDOMINAL HYSTERECTOMY     BACK SURGERY     BUNIONECTOMY Bilateral    EYE SURGERY     laser surgery   NASAL SINUS SURGERY     x2   REVERSE SHOULDER ARTHROPLASTY Right 07/03/2022   Procedure: REVERSE SHOULDER ARTHROPLASTY;  Surgeon: Kay Kemps, MD;  Location: [facility];  Service: Orthopedics;  Laterality: Right;  120 min choice with interscalene block   SACROILIAC JOINT FUSION Bilateral 09/12/2020   Procedure: REMOVAL OF BILATERAL PELVIC SCREWS;  Surgeon: Beuford Anes, MD;  Location: [facility];  Service: Orthopedics;  Laterality: Bilateral;   SKIN CANCER EXCISION     face x3   TUBAL LIGATION     Past Surgical History:  Procedure Laterality Date   ABDOMINAL HYSTERECTOMY     BACK SURGERY     BUNIONECTOMY Bilateral    EYE SURGERY     laser surgery   NASAL SINUS SURGERY     x2   REVERSE SHOULDER ARTHROPLASTY Right 07/03/2022   Procedure: REVERSE SHOULDER ARTHROPLASTY;  Surgeon: Kay Kemps, MD;  Location: [facility];  Service: Orthopedics;  Laterality: Right;  120 min choice with interscalene block   SACROILIAC JOINT FUSION Bilateral 09/12/2020   Procedure: REMOVAL OF BILATERAL PELVIC SCREWS;  Surgeon: Beuford Anes, MD;  Location: [facility];  Service: Orthopedics;  Laterality: Bilateral;   SKIN CANCER EXCISION     face x3   TUBAL LIGATION     Past Medical History:  Diagnosis Date   Allergic rhinitis    Anxiety    Arthritis    Cancer (HCC)    skin   Chronic pain    Chronic sinusitis 07/27/2018   Chronically dry eyes    Depression    Fibromyalgia    GERD (gastroesophageal reflux disease)    Headache    Osteoporosis    Overweight with body mass index (BMI) 25.0-29.9 07/22/2016   Radiculopathy 06/29/2019   Sleep apnea    Stroke (HCC)    from gabapenting per pt no deficits   Vitamin D  deficiency    Wears glasses    BP  131/79 (BP Location: Left Arm, Patient Position: Sitting, Cuff Size: Normal)   Pulse 75   Ht 5' 3.5 (1.613 m)   Wt 134 lb (60.8 kg)   SpO2 98%   BMI 23.36 kg/m   Opioid Risk Score:   Fall Risk Score:  `1  Depression screen  PHQ 2/9     05/16/2024   12:06 PM 04/04/2024   10:42 AM 12/15/2023   10:43 AM 11/16/2023   11:00 AM 08/12/2023   11:14 AM 02/18/2023   10:58 AM 10/26/2022   10:59 AM  Depression screen PHQ 2/9  Decreased Interest 1 0 1 1 1  0 0  Down, Depressed, Hopeless 1  1 1 1  0 0  PHQ - 2 Score 2 0 2 2 2  0 0  Altered sleeping 2  1      Tired, decreased energy 2  1      Change in appetite 1  0      Feeling bad or failure about yourself  1  0      Trouble concentrating 0        Moving slowly or fidgety/restless 0  0      Suicidal thoughts 0  0      PHQ-9 Score 8  4       Difficult doing work/chores Somewhat difficult  Somewhat difficult         Data saved with a previous flowsheet row definition    Review of Systems  Musculoskeletal:  Positive for arthralgias and myalgias.       Upper back pain across shoulders, bilateral arm and knee pain, left foot pain  All other systems reviewed and are negative.      Objective:   Physical Exam        Assessment & Plan:    "

## 2024-07-05 ENCOUNTER — Other Ambulatory Visit: Payer: Self-pay | Admitting: Physician Assistant

## 2024-07-05 ENCOUNTER — Other Ambulatory Visit: Payer: Self-pay

## 2024-07-09 ENCOUNTER — Other Ambulatory Visit: Payer: Self-pay | Admitting: Physician Assistant

## 2024-07-12 ENCOUNTER — Encounter: Payer: Self-pay | Admitting: Physician Assistant

## 2024-07-12 ENCOUNTER — Ambulatory Visit (INDEPENDENT_AMBULATORY_CARE_PROVIDER_SITE_OTHER): Admitting: Physician Assistant

## 2024-07-12 DIAGNOSIS — G4700 Insomnia, unspecified: Secondary | ICD-10-CM | POA: Diagnosis not present

## 2024-07-12 DIAGNOSIS — F329 Major depressive disorder, single episode, unspecified: Secondary | ICD-10-CM | POA: Diagnosis not present

## 2024-07-12 DIAGNOSIS — F4310 Post-traumatic stress disorder, unspecified: Secondary | ICD-10-CM

## 2024-07-12 DIAGNOSIS — F411 Generalized anxiety disorder: Secondary | ICD-10-CM | POA: Diagnosis not present

## 2024-07-12 MED ORDER — DULOXETINE HCL 30 MG PO CPEP: 30.0000 mg | ORAL_CAPSULE | Freq: Every day | ORAL | 1 refills | Status: AC

## 2024-07-12 NOTE — Patient Instructions (Signed)
 Wean off the Zoloft  50 mg by taking 1/2 pill daily for 4 days and then stop.  At the same time, start the Cymbalta .

## 2024-07-12 NOTE — Progress Notes (Signed)
 "     [facility] Med Check  Patient ID: Sharon Logan,  MRN: [id number]  PCP: Lucius Krabbe, NP  Date of Evaluation: 07/12/2024 Time spent:20 minutes  Chief Complaint:  Chief Complaint   Depression; Insomnia; Follow-up    HISTORY/CURRENT STATUS: HPI  For routine med check.  The Zoloft  hasn't helped. One of the drs at the [place] and her pain specialist recommend she get off it and try Cymbalta .  I have no problem with that.  She's tired all the time. Has a hard time getting up in the mornings.  No motivation. Feels sad.  Sleeping pretty good on the Lunesta .  ADLs and personal hygiene are normal.   Denies any changes in concentration, making decisions, or remembering things.  Appetite has not changed.  Weight is stable.   No extreme anxiety, no panic attacks.  No mania, delirium, AH/VH.  No SI/HI.  Individual Medical History/ Review of Systems: Changes? :No     Past Medication Trials: Cymbalta - Started after back surgery. Has made her tired, even when reduced to 30 mg. Was having to take 2 hour naps. May have helped slightly with pain. Had HA, Constipation, Nausea Savella- muscle aches, rhabdomyolysis Effexor-HA, helped with excessive sweating.  Pristiq - May have increased depression. Prozac- Constipation. Causes her to feel cold.  Celexa - Worsening depression at 20 mg dose Lexapro - My anti-depressant of choice and gives her energy. Was taking 2-3 years prior to surgery.  Zoloft   Paxil Viibryd - Felt depression was more heavy. Wellbutrin - Did not cause HA's. Tolerated well and was effective for depression. Auvelity - felt drugged and was forgetting appointments.  Amitriptyline - Did not cause HA's. Weight gain. Effective. Caused dry mouth. Nortriptyline - Did not cause HA's. Effective. Lamictal- Rash Gabapentin -Rhabomyolysis Lyrica - Adverse reaction Buspar - Helpful for anxiety in the past. Well-tolerated. Tried again and had severe anxiety.  Trazodone - helped with sleep  initiation but sleep was not restful. Severe dry mouth. Ambien- Parasomnia, sleep walking Lunesta - Tolerated, effective at 2 mg dose Remeron - Dry eyes, internal restlessness. Stomach discomfort.  Clonidine  Topamax - ineffective for headaches. Caused cognitive side effects.  Hydroxyzine- Initially helped with anxiety and sleep initiation, then no longer as helpful  TMS at [facility], beginning January 2026  Allergies: Amlodipine , Gabapentin, Nortriptyline , Polymyxin b, Pregabalin , Zolpidem, Amitriptyline , Lamotrigine, Oxcarbazepine, Oxycodone -acetaminophen , Oxycodone , Sumatriptan , Cymbalta  [duloxetine  hcl], Duloxetine , Fluoxetine, and Other  Current Medications:  Current Outpatient Medications:    alendronate  (FOSAMAX ) 70 MG tablet, Take 1 tablet (70 mg total) by mouth once a week., Disp: 12 tablet, Rfl: 3   amLODipine  (NORVASC ) 5 MG tablet, Take 1 tablet (5 mg total) by mouth daily., Disp: 90 tablet, Rfl: 1   Black Cohosh  200 MG CAPS, Take 1 capsule (200 mg total) by mouth 3 (three) times daily., Disp: 90 capsule, Rfl: 0   cetirizine (ZYRTEC) 10 MG tablet, Take 10 mg by mouth., Disp: , Rfl:    Cholecalciferol  (VITAMIN D3) 25 MCG (1000 UT) CAPS, Take by mouth., Disp: , Rfl:    Cyanocobalamin  (VITAMIN B12 PO), Take by mouth., Disp: , Rfl:    EPINEPHrine  0.3 mg/0.3 mL IJ SOAJ injection, Inject 0.3 mg into the muscle as needed for anaphylaxis., Disp: , Rfl:    Eszopiclone  3 MG TABS, Take 1 tablet (3 mg total) by mouth at bedtime as needed. Take immediately before bedtime, Disp: 30 tablet, Rfl: 5   famotidine (PEPCID) 40 MG tablet, Take 40 mg by mouth every evening., Disp: , Rfl:    fluticasone  (FLONASE ) 50 MCG/ACT nasal  spray, Place 2 sprays into both nostrils 2 (two) times daily as needed for allergies or rhinitis., Disp: , Rfl:    Galcanezumab -gnlm (EMGALITY ) 120 MG/ML SOAJ, Inject 120 mg into the skin every 30 (thirty) days., Disp: 1.12 mL, Rfl: 11   lidocaine  (LIDODERM ) 5 %, Place 1  patch onto the skin daily., Disp: , Rfl:    magnesium  gluconate (MAGONATE) 500 MG tablet, Take 500 mg by mouth at bedtime., Disp: , Rfl:    methocarbamol  (ROBAXIN ) 500 MG tablet, Take 1 tablet (500 mg total) by mouth every 6 (six) hours as needed for muscle spasms., Disp: 90 tablet, Rfl: 5   OVER THE COUNTER MEDICATION, Bone health(calcium) - [employer], Disp: , Rfl:    oxyCODONE -acetaminophen  (PERCOCET) 10-325 MG tablet, Take 1 tablet by mouth every 6 (six) hours as needed for pain., Disp: 120 tablet, Rfl: 0   saccharomyces boulardii (FLORASTOR) 250 MG capsule, Take 250 mg by mouth daily., Disp: , Rfl:    sertraline  (ZOLOFT ) 50 MG tablet, TAKE 1 TABLET BY MOUTH EVERY DAY, Disp: 30 tablet, Rfl: 0   Ubrogepant  (UBRELVY ) 100 MG TABS, Take 1 tablet (100 mg total) by mouth as needed. May repeat after 2 hours.  Maximum 2 tablets in 24 hours., Disp: 10 tablet, Rfl: 11   UNABLE TO FIND, CPAP, Disp: , Rfl:  Medication Side Effects: dry mouth  Family Medical/ Social History: Changes? No  MENTAL HEALTH EXAM:  There were no vitals taken for this visit.There is no height or weight on file to calculate BMI.  General Appearance: Casual and Well Groomed  Eye Contact:  Good  Speech:  Clear and Coherent and Normal Rate  Volume:  Normal  Mood:  Euthymic  Affect:  Congruent  Thought Process:  Goal Directed and Descriptions of Associations: Circumstantial  Orientation:  Full (Time, Place, and Person)  Thought Content: Logical   Suicidal Thoughts:  No  Homicidal Thoughts:  No  Memory:  WNL  Judgement:  Good  Insight:  Good  Psychomotor Activity:  limps  Concentration:  Concentration: Good and Attention Span: Fair  Recall:  Good  Fund of Knowledge: Good  Language: Good  Assets:  Communication Skills Desire for Improvement Financial Resources/Insurance Housing Leisure Time Resilience Transportation Vocational/Educational  ADL's:  Intact  Cognition: WNL  Prognosis:  Good   EKG 02/17/2024 showed  normal sinus rhythm possible anterior infarct, age undetermined, QT interval normal.  See results on chart.  DIAGNOSES:    ICD-10-CM   1. 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 PTSD (post-traumatic stress disorder)  F43.10     3. Insomnia, unspecified type  G47.00     4. Generalized anxiety disorder  F41.1      Receiving Psychotherapy: No   RECOMMENDATIONS:   PDMP reviewed. Lunesta  filled 06/22/2024.  Also on oxycodone . I provided approximately  20 minutes of face to face time during this encounter, including time spent before and after the visit in records review, medical decision making, counseling pertinent to today's visit, and charting.   We discussed the Zoloft .  It is not helpful so we will wean off that by taking 25 mg daily for 4 days and then stop.  We will retry Cymbalta .  Will keep her on a low-dose until I see her back because she is very sensitive to medications.  She has taken Cymbalta  in the past and it caused headaches and fatigue but she is willing to retry it but stick with the low dose.  She  is continuing TMS with [facility].  I have handwritten a note for her to give them today asking Dr. Jess to reevaluate her because of the facial discomfort and an abnormal left eye movement during the treatment.  Continue Lunesta  3 mg nightly as needed. Start Cymbalta  30 mg, 1 p.o. daily. Continue vitamins as per med list. Continue CPAP machine. Recommend counseling.  Return in 4 weeks.   Verneita Cooks, PA-C  "

## 2024-07-19 NOTE — Progress Notes (Unsigned)
 "  NEUROLOGY FOLLOW UP OFFICE NOTE  SHYLO ZAMOR [id number]  Assessment/Plan:   Chronic pain syndrome and neuralgia.  She may have underlying small-fiber neuropathy.  She does report some autonomic symptoms such as diaphoresis.  If positive, however, it would likely not change management (pain management).  Punch skin biopsy.  If positive, will check labs for possible causes. Otherwise, keep follow up in September.  Total time spent on today's visit was 32 minutes dedicated to this patient today, preparing to see patient, examining the patient, ordering tests and/or medications and counseling the patient, documenting clinical information in the EHR or other health record, independently interpreting results and communicating results to the patient/family, discussing treatment and goals, answering patient's questions and coordinating care.    Subjective:   Discussed the use of AI scribe software for clinical note transcription with the patient, who gave verbal consent to proceed.  History of Present Illness Liviana L. Weikel is a 69 year old right-handed female with fibromyalgia and depression whom I see for migraines and pain follows up for pain.    She has fibromyalgia, chronic neck pain with cervical radiculopathy and chronic low back pain and bilateral lumbar radiculopathy with known spondylosis and spinal stenosis of the lumbar spine s/p L4-5 fusion and later L3-S1 fusion revision.  Over several years, she has experienced neuropathic pain described as burning, zinging, and aching sensations involving both arms and the lower extremities from the knees to the feet. She endorses mild weakness in the feet, legs, arms, and hands, though pain remains the predominant symptom. She reports occasional lightheadedness, which is infrequent and not significant. She is able to drive without difficulty.    Since 2025, the pain has progressed from intermittent to constant and has become severe, resulting  in significant emotional distress, including episodes of crying. She denies any identifiable precipitating factors for the onset or worsening of symptoms.  In early April 2025, she began experiencing burning pain from the elbows to the hands, sometimes into the middle 3 fingers (particularly the index), associated with itching and constant aching.  A couple of weeks later, she began noticing similar pain in her legs as well, mainly in the lateral legs but also the feet and sometimes the calves.  In addition, she notes cramping in her thighs and in the right foot.  Legs overall feel weaker.  She walks her dogs twice a day for 30-40 minutes at a time and has difficulty getting through the second walk due to pain, weakness and feeling short of breath.  In addition, she began experiencing episodes of diaphoresis with sweating of her chest/torso and legs.  Happens at rest.  May have to change her shirt sometimes 5 times a day.   Denies lightheadedness.  She has had extensive workup: X-ray of lumbar spine on 06/03/2023 showed prior anterior posterior fusion at L3-4, L4-5 and L5-S1 as well as interval hardware removal at L3-S1 and placement of spinal stimulator.  MRI of cervical spine on 01/27/2023 showed mild for age noncompressive disc bulging at C2-3 through C7-T1 without significant spinal stenosis or neural impingement and moderate right C4 and mild right C5 foraminal stenosis due to disc bulge and uncovertebral disease.    X-ray of right hip/femur/knee on 11/05/2023 showed stable degenerative changes to both SI joints and chronic changes of the symphysis pubis region but no acute abnormalities.  Repeat MRI of cervical spine on 09/17/2023 showed mild cervical spondylosis and facet joint arthrosis without herniated disc, spinal stenosis or  foraminal stenosis.  She had a NCV-EMG of the upper extremities on 10/27/2023 to evaluate bilateral upper extremity pain and paresthesias which showed just very mild median  neuropathy at the left wrist but no evidience of cervical radiculopathy or generalized peripheral neuropathy.    She had NCV-EMG of the lower extremities on 11/17/2023 was normal.  CT of lumbar spine on 02/22/2024 revealed:  1. No acute osseous abnormality in the Lumbar Spine. Healed S2 sacral fracture since 2022. 2. Chronic decompression and fusion L3 through S1. All pedicle screws removed since 2022. Solid-appearing posterior element arthrodesis L3 through the sacrum. 3. Adjacent segment disease at L2-L3 with mild spinal stenosis.  CT thoracic spine performed same day revealed:  1. Osteopenia with no acute osseous abnormality in the Thoracic Spine. 2. Mild for age thoracic spine degeneration. Capacious thoracic spinal canal.  She is currently followed by pain management as well as physical medicine and rehab.  She is taking oxycodone , methocarbamol , lidocaine  patch, and duloxetine .  She received an ESI at C7-T1 on 06/28/2024, which was ineffective..  She notices that when she takes Ubrelvy  for her migraines, it helps with the pain as well.  She has failed amitriptyline /nortriptyline , Cymbalta  (fatigue), gabapentin (rhabdomyolysis), Lyrica , lamotrigine (rash), oxycarbazepine, topiramate  (cognitive deficits), Nucenta, baclofen , diclofenac , Celebrex, injections and surgery.    PAST MEDICAL HISTORY: Past Medical History:  Diagnosis Date   Allergic rhinitis    Anxiety    Arthritis    Cancer (HCC)    skin   Chronic pain    Chronic sinusitis 07/27/2018   Chronically dry eyes    Depression    Fibromyalgia    GERD (gastroesophageal reflux disease)    Headache    Osteoporosis    Overweight with body mass index (BMI) 25.0-29.9 07/22/2016   Radiculopathy 06/29/2019   Sleep apnea    Stroke (HCC)    from gabapenting per pt no deficits   Vitamin D  deficiency    Wears glasses     MEDICATIONS: Medications Ordered Prior to Encounter[1]  ALLERGIES: Allergies[2]  FAMILY HISTORY: Family History   Problem Relation Age of Onset   Depression Mother    Heart attack Mother    COPD Mother    Depression Father    Heart attack Father    ADD / ADHD Son       Objective:  Blood pressure 138/78, pulse 72, height 5' 3 (1.6 m), weight 137 lb (62.1 kg), SpO2 96%. General: No acute distress.  Patient appears well-groomed.   Head:  Normocephalic/atraumatic Eyes:  Fundi examined but not visualized Neck: supple, no paraspinal tenderness, full range of motion Heart:  Regular rate and rhythm Neurological Exam: alert and oriented.  Speech fluent and not dysarthric, language intact.  CN II-XII intact. Bulk and tone normal, Bilateral APB muscles 4+/5 bilaterally but otherwise muscle strength 5/5 throughout.  Sensation to pinprick and vibration reduced in first toe on right foot.  Deep tendon reflexes 2+ throughout, toes downgoing.  Finger to nose testing intact.  Gait normal, Romberg negative.   Juliene Dunnings, DO  CC: Corean Comment, NP          [1]  Current Outpatient Medications on File Prior to Visit  Medication Sig Dispense Refill   alendronate  (FOSAMAX ) 70 MG tablet Take 1 tablet (70 mg total) by mouth once a week. 12 tablet 3   amLODipine  (NORVASC ) 5 MG tablet Take 1 tablet (5 mg total) by mouth daily. 90 tablet 1   Black Cohosh  200 MG CAPS Take  1 capsule (200 mg total) by mouth 3 (three) times daily. 90 capsule 0   cetirizine (ZYRTEC) 10 MG tablet Take 10 mg by mouth.     Cholecalciferol  (VITAMIN D3) 25 MCG (1000 UT) CAPS Take by mouth.     Cyanocobalamin  (VITAMIN B12 PO) Take by mouth.     DULoxetine  (CYMBALTA ) 30 MG capsule Take 1 capsule (30 mg total) by mouth daily. 30 capsule 1   EPINEPHrine  0.3 mg/0.3 mL IJ SOAJ injection Inject 0.3 mg into the muscle as needed for anaphylaxis.     Eszopiclone  3 MG TABS Take 1 tablet (3 mg total) by mouth at bedtime as needed. Take immediately before bedtime 30 tablet 5   famotidine (PEPCID) 40 MG tablet Take 40 mg by mouth every evening.      fluticasone  (FLONASE ) 50 MCG/ACT nasal spray Place 2 sprays into both nostrils 2 (two) times daily as needed for allergies or rhinitis.     Galcanezumab -gnlm (EMGALITY ) 120 MG/ML SOAJ Inject 120 mg into the skin every 30 (thirty) days. 1.12 mL 11   lidocaine  (LIDODERM ) 5 % Place 1 patch onto the skin daily.     magnesium  gluconate (MAGONATE) 500 MG tablet Take 500 mg by mouth at bedtime.     methocarbamol  (ROBAXIN ) 500 MG tablet Take 1 tablet (500 mg total) by mouth every 6 (six) hours as needed for muscle spasms. 90 tablet 5   OVER THE COUNTER MEDICATION Bone health(calcium) - [employer]     oxyCODONE -acetaminophen  (PERCOCET) 10-325 MG tablet Take 1 tablet by mouth every 6 (six) hours as needed for pain. 120 tablet 0   saccharomyces boulardii (FLORASTOR) 250 MG capsule Take 250 mg by mouth daily.     Ubrogepant  (UBRELVY ) 100 MG TABS Take 1 tablet (100 mg total) by mouth as needed. May repeat after 2 hours.  Maximum 2 tablets in 24 hours. 10 tablet 11   UNABLE TO FIND CPAP     No current facility-administered medications on file prior to visit.  [2]  Allergies Allergen Reactions   Amlodipine  Other (See Comments)    headaches  Other Reaction(s): Not available  amlodipine    Gabapentin Other (See Comments)    Delirium, stroke like symptoms  Other Reaction(s): Myalgia, Other  Has stroke symptoms when taking this with percocet.     Other reaction(s): Delirium    Delirium, stroke like symptoms  Other Reaction(s): Unknown   Nortriptyline  Other (See Comments)    nortriptyline    Polymyxin B Other (See Comments), Dermatitis and Hives    Eyes go blood red  Other Reaction(s): Redness  Other reaction(s): Other (See Comments), Red eye    Eyes go blood red    Other Reaction(s): Unknown  Made eyes red  Other Reaction(s): Not available, Redness, Unknown  polymyxin B  polymyxin B sulfate  Other reaction(s): Other (See Comments), Red eye    Eyes go blood red    Other Reaction(s):  Unknown  Made eyes red    Eyes go blood red  Other Reaction(s): Redness  Other reaction(s): Other (See Comments), Red eye  Eyes go blood red  Other Reaction(s): Unknown  Made eyes red   Pregabalin  Itching, Rash, Anxiety and Dermatitis    Itchy red rash on chest  Other Reaction(s): other  Other Reaction(s): Unknown  Other Reaction(s): itchy red rash on chest  pregabalin    Zolpidem Rash, Dermatitis and Other (See Comments)    sleep walk  Other Reaction(s): Other (See Comments)  zolpidem  sleep walk  sleep walk  Other Reaction(s): Other (See Comments)   Amitriptyline  Other (See Comments)    Other Reaction(s): Not available, Unknown  amitriptyline    Lamotrigine Rash, Dermatitis and Other (See Comments)   Oxcarbazepine Other (See Comments)    Other Reaction(s): Other (See Comments)   Oxycodone -Acetaminophen      Other Reaction(s): Other (See Comments)  Had stroke symptoms mixed with gabapentin.  Had stroke symptoms mixed with gabapentin.     Had stroke symptoms mixed with gabapentin.   Oxycodone      Other Reaction(s): Had stroke symptoms  mixed with gabapentin   Sumatriptan  Other (See Comments)    Other Reaction(s): Fatigue  Other Reaction(s): fatigue, Not available  sumatriptan    Cymbalta  [Duloxetine  Hcl] Other (See Comments)    Headaches, constipation   Duloxetine  Other (See Comments)    HEADACHES  CONSTIPATION  Other Reaction(s): Other (See Comments)  Per patient causes headache and constipation  Other Reaction(s): constipation and headache  duloxetine    Fluoxetine Other (See Comments)    CONSTIPATION  Other Reaction(s): Other (See Comments)  Per patient constipation  Other Reaction(s): constipation  fluoxetine   Other Other (See Comments)    OTOBIOTIC > RED EYES   "

## 2024-07-20 ENCOUNTER — Telehealth: Payer: Self-pay

## 2024-07-20 ENCOUNTER — Ambulatory Visit: Payer: Self-pay | Admitting: Neurology

## 2024-07-20 ENCOUNTER — Encounter: Payer: Self-pay | Admitting: Neurology

## 2024-07-20 ENCOUNTER — Other Ambulatory Visit: Payer: Self-pay

## 2024-07-20 VITALS — BP 138/78 | HR 72 | Ht 63.0 in | Wt 137.0 lb

## 2024-07-20 DIAGNOSIS — M792 Neuralgia and neuritis, unspecified: Secondary | ICD-10-CM | POA: Diagnosis not present

## 2024-07-20 NOTE — Patient Instructions (Addendum)
 Will schedule punch skin biopsy with Dr. Leigh to check for small fiber neuropathy.  Further recommendations pending results.

## 2024-07-20 NOTE — Telephone Encounter (Signed)
 Pt does not need a PA for the Skin Biopsy. Ref Call # [id number].

## 2024-07-21 ENCOUNTER — Encounter: Payer: Self-pay | Admitting: Family

## 2024-07-21 ENCOUNTER — Ambulatory Visit: Admitting: Family

## 2024-07-21 VITALS — BP 134/84 | HR 68 | Temp 97.0°F | Ht 63.0 in | Wt 136.0 lb

## 2024-07-21 DIAGNOSIS — M25371 Other instability, right ankle: Secondary | ICD-10-CM

## 2024-07-21 DIAGNOSIS — R2681 Unsteadiness on feet: Secondary | ICD-10-CM

## 2024-07-21 DIAGNOSIS — M25374 Other instability, right foot: Secondary | ICD-10-CM

## 2024-07-21 NOTE — Progress Notes (Signed)
 "  Patient ID: Sharon Logan, female    DOB: 1955/09/04, 69 y.o.   MRN: [id number]  Chief Complaint  Patient presents with   Foot Problem    Pt would like a referral for PT for right foot stabilization.   Discussed the use of AI scribe software for clinical note transcription with the patient, who gave verbal consent to proceed.  History of Present Illness Sharon Logan Lentsch is a 69 year old female who presents with right foot instability and fatigue.  She has right foot and ankle instability since surgery and feels the foot may give out. She wants physical therapy for the right foot at [facility]. She has significant fatigue and low energy that she associates with chronic pain and medications. She is transitioning from Zoloft  to Cymbalta  and recently started Cymbalta . She takes vitamin D  and Lunesta  at night without morning drowsiness. She hopes Cymbalta  will improve fatigue and fibromyalgia pain. She is regularly active with gym machines and a water  walking class and is considering adding yoga. She has had no recent falls or injuries despite winter conditions.  Assessment & Plan Right foot and ankle instability with gait disturbance Instability due to post-surgical changes affecting function. - Referred to physical therapy at [facility] for right foot and ankle instability.  Chronic pain with fatigue and fibromyalgia Fatigue and pain likely exacerbated by medications. Transitioning from sertraline  to duloxetine , expected to improve symptoms within a month. Regular exercise beneficial. - Continue duloxetine  and monitor for improvement in fatigue and pain. - Follow up with psychiatrist for potential dose adjustment of duloxetine . - Continue regular exercise regimen, including water  walking and machine workouts.  Subjective:    Outpatient Medications Prior to Visit  Medication Sig Dispense Refill   alendronate  (FOSAMAX ) 70 MG tablet Take 1 tablet (70 mg total) by mouth once a week. 12 tablet 3    amLODipine  (NORVASC ) 5 MG tablet Take 1 tablet (5 mg total) by mouth daily. 90 tablet 1   Black Cohosh  200 MG CAPS Take 1 capsule (200 mg total) by mouth 3 (three) times daily. 90 capsule 0   cetirizine (ZYRTEC) 10 MG tablet Take 10 mg by mouth.     Cyanocobalamin  (VITAMIN B12 PO) Take by mouth.     DULoxetine  (CYMBALTA ) 30 MG capsule Take 1 capsule (30 mg total) by mouth daily. 30 capsule 1   EPINEPHrine  0.3 mg/0.3 mL IJ SOAJ injection Inject 0.3 mg into the muscle as needed for anaphylaxis.     Eszopiclone  3 MG TABS Take 1 tablet (3 mg total) by mouth at bedtime as needed. Take immediately before bedtime 30 tablet 5   famotidine (PEPCID) 40 MG tablet Take 40 mg by mouth every evening. (Patient taking differently: Take 40 mg by mouth as needed.)     fluticasone  (FLONASE ) 50 MCG/ACT nasal spray Place 2 sprays into both nostrils 2 (two) times daily as needed for allergies or rhinitis.     Galcanezumab -gnlm (EMGALITY ) 120 MG/ML SOAJ Inject 120 mg into the skin every 30 (thirty) days. 1.12 mL 11   lidocaine  (LIDODERM ) 5 % Place 1 patch onto the skin daily. (Patient taking differently: Place 1 patch onto the skin as needed.)     magnesium  gluconate (MAGONATE) 500 MG tablet Take 500 mg by mouth at bedtime.     methocarbamol  (ROBAXIN ) 500 MG tablet Take 1 tablet (500 mg total) by mouth every 6 (six) hours as needed for muscle spasms. 90 tablet 5   OVER THE COUNTER MEDICATION Bone  health(calcium) - [employer]     oxyCODONE -acetaminophen  (PERCOCET) 10-325 MG tablet Take 1 tablet by mouth every 6 (six) hours as needed for pain. 120 tablet 0   saccharomyces boulardii (FLORASTOR) 250 MG capsule Take 250 mg by mouth daily.     Ubrogepant  (UBRELVY ) 100 MG TABS Take 1 tablet (100 mg total) by mouth as needed. May repeat after 2 hours.  Maximum 2 tablets in 24 hours. 10 tablet 11   UNABLE TO FIND CPAP     No facility-administered medications prior to visit.   Past Medical History:  Diagnosis Date   Allergic  rhinitis    Anxiety    Arthritis    Cancer (HCC)    skin   Chronic pain    Chronic sinusitis 07/27/2018   Chronically dry eyes    Depression    Fibromyalgia    GERD (gastroesophageal reflux disease)    Headache    Osteoporosis    Overweight with body mass index (BMI) 25.0-29.9 07/22/2016   Radiculopathy 06/29/2019   Sleep apnea    Stroke (HCC)    from gabapenting per pt no deficits   Vitamin D  deficiency    Wears glasses    Past Surgical History:  Procedure Laterality Date   ABDOMINAL HYSTERECTOMY     BACK SURGERY     BUNIONECTOMY Bilateral    EYE SURGERY     laser surgery   NASAL SINUS SURGERY     x2   REVERSE SHOULDER ARTHROPLASTY Right 07/03/2022   Procedure: REVERSE SHOULDER ARTHROPLASTY;  Surgeon: Kay Kemps, MD;  Location: [facility];  Service: Orthopedics;  Laterality: Right;  120 min choice with interscalene block   SACROILIAC JOINT FUSION Bilateral 09/12/2020   Procedure: REMOVAL OF BILATERAL PELVIC SCREWS;  Surgeon: Beuford Anes, MD;  Location: [facility];  Service: Orthopedics;  Laterality: Bilateral;   SKIN CANCER EXCISION     face x3   TUBAL LIGATION     Allergies[1]    Objective:    Physical Exam Vitals and nursing note reviewed.  Constitutional:      Appearance: Normal appearance.  Cardiovascular:     Rate and Rhythm: Normal rate and regular rhythm.  Pulmonary:     Effort: Pulmonary effort is normal.     Breath sounds: Normal breath sounds.  Musculoskeletal:        General: Normal range of motion.  Skin:    General: Skin is warm and dry.  Neurological:     Mental Status: She is alert.  Psychiatric:        Mood and Affect: Mood normal.        Behavior: Behavior normal.    BP 134/84 (BP Location: Left Arm, Patient Position: Sitting, Cuff Size: Normal)   Pulse 68   Temp (!) 97 F (36.1 C) (Temporal)   Ht 5' 3 (1.6 m)   Wt 136 lb (61.7 kg)   SpO2 98%   BMI 24.09 kg/m  Wt Readings from Last 3 Encounters:  07/21/24 136 lb (61.7 kg)   07/20/24 137 lb (62.1 kg)  06/30/24 134 lb (60.8 kg)       Puneet Masoner, NP     [1]  Allergies Allergen Reactions   Amlodipine  Other (See Comments)    headaches  Other Reaction(s): Not available  amlodipine    Gabapentin Other (See Comments)    Delirium, stroke like symptoms  Other Reaction(s): Myalgia, Other  Has stroke symptoms when taking this with percocet.     Other reaction(s): Delirium  Delirium, stroke like symptoms  Other Reaction(s): Unknown   Nortriptyline  Other (See Comments)    nortriptyline    Polymyxin B Other (See Comments), Dermatitis and Hives    Eyes go blood red  Other Reaction(s): Redness  Other reaction(s): Other (See Comments), Red eye    Eyes go blood red    Other Reaction(s): Unknown  Made eyes red  Other Reaction(s): Not available, Redness, Unknown  polymyxin B  polymyxin B sulfate  Other reaction(s): Other (See Comments), Red eye    Eyes go blood red    Other Reaction(s): Unknown  Made eyes red    Eyes go blood red  Other Reaction(s): Redness  Other reaction(s): Other (See Comments), Red eye  Eyes go blood red  Other Reaction(s): Unknown  Made eyes red   Pregabalin  Itching, Rash, Anxiety and Dermatitis    Itchy red rash on chest  Other Reaction(s): other  Other Reaction(s): Unknown  Other Reaction(s): itchy red rash on chest  pregabalin    Zolpidem Rash, Dermatitis and Other (See Comments)    sleep walk  Other Reaction(s): Other (See Comments)  zolpidem  sleep walk    sleep walk  Other Reaction(s): Other (See Comments)   Amitriptyline  Other (See Comments)    Other Reaction(s): Not available, Unknown  amitriptyline    Lamotrigine Rash, Dermatitis and Other (See Comments)   Oxcarbazepine Other (See Comments)    Other Reaction(s): Other (See Comments)   Oxycodone -Acetaminophen      Other Reaction(s): Other (See Comments)  Had stroke symptoms mixed with gabapentin.  Had stroke symptoms mixed with  gabapentin.     Had stroke symptoms mixed with gabapentin.   Oxycodone      Other Reaction(s): Had stroke symptoms  mixed with gabapentin   Sumatriptan  Other (See Comments)    Other Reaction(s): Fatigue  Other Reaction(s): fatigue, Not available  sumatriptan    Cymbalta  [Duloxetine  Hcl] Other (See Comments)    Headaches, constipation   Duloxetine  Other (See Comments)    HEADACHES  CONSTIPATION  Other Reaction(s): Other (See Comments)  Per patient causes headache and constipation  Other Reaction(s): constipation and headache  duloxetine    Fluoxetine Other (See Comments)    CONSTIPATION  Other Reaction(s): Other (See Comments)  Per patient constipation  Other Reaction(s): constipation  fluoxetine   Other Other (See Comments)    OTOBIOTIC > RED EYES   "

## 2024-08-03 ENCOUNTER — Encounter: Admitting: Physical Medicine and Rehabilitation

## 2024-08-11 ENCOUNTER — Ambulatory Visit: Admitting: Physician Assistant

## 2024-08-22 ENCOUNTER — Other Ambulatory Visit: Payer: Self-pay | Admitting: Neurology

## 2024-09-22 ENCOUNTER — Ambulatory Visit: Admitting: Physician Assistant

## 2024-10-02 ENCOUNTER — Encounter: Admitting: Physical Medicine and Rehabilitation

## 2024-10-03 ENCOUNTER — Ambulatory Visit: Admitting: Neurology

## 2024-11-03 ENCOUNTER — Ambulatory Visit: Admitting: Physician Assistant

## 2024-11-09 ENCOUNTER — Ambulatory Visit: Admitting: Diagnostic Neuroimaging

## 2025-03-12 ENCOUNTER — Ambulatory Visit: Payer: Self-pay | Admitting: Neurology
